# Patient Record
Sex: Male | Born: 1950 | Race: White | Hispanic: No | Marital: Married | Smoking: Former smoker
Health system: Southern US, Community
[De-identification: ages and names within clinical notes are randomized; demographics above are authoritative.]

## PROBLEM LIST (undated history)

## (undated) DIAGNOSIS — Z951 Presence of aortocoronary bypass graft: Secondary | ICD-10-CM

## (undated) DIAGNOSIS — G4730 Sleep apnea, unspecified: Secondary | ICD-10-CM

## (undated) DIAGNOSIS — E119 Type 2 diabetes mellitus without complications: Secondary | ICD-10-CM

## (undated) DIAGNOSIS — Z973 Presence of spectacles and contact lenses: Secondary | ICD-10-CM

## (undated) DIAGNOSIS — Z974 Presence of external hearing-aid: Secondary | ICD-10-CM

## (undated) DIAGNOSIS — R011 Cardiac murmur, unspecified: Secondary | ICD-10-CM

## (undated) DIAGNOSIS — Z953 Presence of xenogenic heart valve: Secondary | ICD-10-CM

## (undated) DIAGNOSIS — F329 Major depressive disorder, single episode, unspecified: Secondary | ICD-10-CM

## (undated) DIAGNOSIS — K219 Gastro-esophageal reflux disease without esophagitis: Secondary | ICD-10-CM

## (undated) DIAGNOSIS — G2581 Restless legs syndrome: Secondary | ICD-10-CM

## (undated) DIAGNOSIS — E1159 Type 2 diabetes mellitus with other circulatory complications: Secondary | ICD-10-CM

## (undated) DIAGNOSIS — F32A Depression, unspecified: Secondary | ICD-10-CM

## (undated) DIAGNOSIS — E039 Hypothyroidism, unspecified: Secondary | ICD-10-CM

## (undated) DIAGNOSIS — R350 Frequency of micturition: Secondary | ICD-10-CM

## (undated) DIAGNOSIS — E7800 Pure hypercholesterolemia, unspecified: Secondary | ICD-10-CM

## (undated) DIAGNOSIS — N400 Enlarged prostate without lower urinary tract symptoms: Secondary | ICD-10-CM

## (undated) DIAGNOSIS — Z8701 Personal history of pneumonia (recurrent): Secondary | ICD-10-CM

## (undated) DIAGNOSIS — M1990 Unspecified osteoarthritis, unspecified site: Secondary | ICD-10-CM

## (undated) DIAGNOSIS — I2510 Atherosclerotic heart disease of native coronary artery without angina pectoris: Secondary | ICD-10-CM

## (undated) HISTORY — PX: COLONOSCOPY: SHX174

## (undated) HISTORY — PX: ESOPHAGOGASTRODUODENOSCOPY: SHX1529

## (undated) HISTORY — DX: Presence of xenogenic heart valve: Z95.3

## (undated) HISTORY — DX: Cardiac murmur, unspecified: R01.1

## (undated) HISTORY — DX: Presence of aortocoronary bypass graft: Z95.1

## (undated) HISTORY — DX: Restless legs syndrome: G25.81

## (undated) HISTORY — DX: Type 2 diabetes mellitus with other circulatory complications: E11.59

## (undated) HISTORY — DX: Pure hypercholesterolemia, unspecified: E78.00

## (undated) HISTORY — PX: TONSILLECTOMY: SHX5217

## (undated) HISTORY — DX: Depression, unspecified: F32.A

## (undated) HISTORY — DX: Major depressive disorder, single episode, unspecified: F32.9

## (undated) HISTORY — DX: Atherosclerotic heart disease of native coronary artery without angina pectoris: I25.10

---

## 1983-03-18 HISTORY — PX: VASECTOMY: SHX75

## 2000-10-19 ENCOUNTER — Encounter: Admission: RE | Admit: 2000-10-19 | Discharge: 2001-01-17 | Payer: Self-pay | Admitting: Family Medicine

## 2005-09-19 ENCOUNTER — Encounter: Admission: RE | Admit: 2005-09-19 | Discharge: 2005-09-19 | Payer: Self-pay | Admitting: Family Medicine

## 2006-09-16 ENCOUNTER — Encounter: Admission: RE | Admit: 2006-09-16 | Discharge: 2006-09-16 | Payer: Self-pay | Admitting: Family Medicine

## 2008-08-23 ENCOUNTER — Ambulatory Visit (HOSPITAL_COMMUNITY): Admission: RE | Admit: 2008-08-23 | Discharge: 2008-08-23 | Payer: Self-pay | Admitting: Cardiology

## 2008-08-23 ENCOUNTER — Encounter (INDEPENDENT_AMBULATORY_CARE_PROVIDER_SITE_OTHER): Payer: Self-pay | Admitting: Cardiology

## 2010-05-26 ENCOUNTER — Emergency Department (HOSPITAL_COMMUNITY): Payer: BC Managed Care – PPO

## 2010-05-26 ENCOUNTER — Emergency Department (HOSPITAL_COMMUNITY)
Admission: EM | Admit: 2010-05-26 | Discharge: 2010-05-26 | Disposition: A | Discharge status: Home / Self Care | Payer: BC Managed Care – PPO | Attending: Emergency Medicine | Admitting: Emergency Medicine

## 2010-05-26 DIAGNOSIS — S20219A Contusion of unspecified front wall of thorax, initial encounter: Secondary | ICD-10-CM | POA: Insufficient documentation

## 2010-05-26 DIAGNOSIS — E119 Type 2 diabetes mellitus without complications: Secondary | ICD-10-CM | POA: Insufficient documentation

## 2010-05-26 DIAGNOSIS — Y92009 Unspecified place in unspecified non-institutional (private) residence as the place of occurrence of the external cause: Secondary | ICD-10-CM | POA: Insufficient documentation

## 2010-05-26 DIAGNOSIS — W1809XA Striking against other object with subsequent fall, initial encounter: Secondary | ICD-10-CM | POA: Insufficient documentation

## 2010-05-26 DIAGNOSIS — R071 Chest pain on breathing: Secondary | ICD-10-CM | POA: Insufficient documentation

## 2010-05-26 DIAGNOSIS — E7800 Pure hypercholesterolemia, unspecified: Secondary | ICD-10-CM | POA: Insufficient documentation

## 2010-05-26 DIAGNOSIS — M129 Arthropathy, unspecified: Secondary | ICD-10-CM | POA: Insufficient documentation

## 2010-05-26 DIAGNOSIS — M25519 Pain in unspecified shoulder: Secondary | ICD-10-CM | POA: Insufficient documentation

## 2012-07-30 ENCOUNTER — Ambulatory Visit (INDEPENDENT_AMBULATORY_CARE_PROVIDER_SITE_OTHER): Payer: 59 | Admitting: Surgery

## 2012-07-30 ENCOUNTER — Encounter (INDEPENDENT_AMBULATORY_CARE_PROVIDER_SITE_OTHER): Payer: Self-pay | Admitting: Surgery

## 2012-07-30 VITALS — BP 138/80 | HR 70 | Temp 97.8°F | Resp 18 | Ht 70.0 in | Wt 173.0 lb

## 2012-07-30 DIAGNOSIS — K4090 Unilateral inguinal hernia, without obstruction or gangrene, not specified as recurrent: Secondary | ICD-10-CM | POA: Insufficient documentation

## 2012-07-30 NOTE — Progress Notes (Signed)
Patient ID: Steven Lawrence, male   DOB: 04/30/1950, 62 y.o.   MRN: [id number]  Chief Complaint  Patient presents with  . New Evaluation    R/H    HPI Steven Lawrence is a 62 y.o. male.  Referred by Ma Hillock PA for Dr. Lupita Raider for right inguinal hernia HPI This is a 62 year old male who presents with a one-year history of intermittent discomfort in his right groin. Recently this has become more severe and more persistent. He denies any bulging in this area. He was examined and was felt to have a right inguinal hernia. He is now referred for surgical evaluation. He does report occasional abdominal bloating as well as pain radiating down into his right testicle and into his right side.  PMH: Diabetes type 2 Benign prostatic hypertrophy Hypercholesterolemia Restless leg syndrome Obstructive sleep apnea requiring CPAP use at night.  Past Surgical History  Procedure Laterality Date  . Tonsillectomy    . Vasectomy  1985    Family History  Problem Relation Age of Onset  . Diabetes Mother   . Heart disease Mother   . Parkinson's disease Mother   . Heart disease Father   . Cancer Father     melonma/Lung  . Diabetes Sister   . Diabetes Brother   . Heart disease Brother   . Multiple sclerosis Daughter     Social History History  Substance Use Topics  . Smoking status: Former Games developer  . Smokeless tobacco: Not on file     Comment: stopped 4/14  . Alcohol Use: Not on file    Allergies  Allergen Reactions  . Nsaids     Current Outpatient Prescriptions  Medication Sig Dispense Refill  . aspirin 81 MG tablet Take 81 mg by mouth daily.      Marland Kitchen atorvastatin (LIPITOR) 20 MG tablet       . doxazosin (CARDURA) 2 MG tablet       . glipiZIDE-metformin (METAGLIP) 5-500 MG per tablet       . meloxicam (MOBIC) 15 MG tablet       . Multiple Vitamins-Minerals (MULTIVITAMIN WITH MINERALS) tablet Take 1 tablet by mouth daily.      . ONGLYZA 5 MG TABS tablet       . rOPINIRole  (REQUIP) 1 MG tablet        No current facility-administered medications for this visit.    Review of Systems Review of Systems  Constitutional: Negative for fever, chills and unexpected weight change.  HENT: Negative for hearing loss, congestion, sore throat, trouble swallowing and voice change.   Eyes: Negative for visual disturbance.  Respiratory: Negative for cough and wheezing.   Cardiovascular: Negative for chest pain, palpitations and leg swelling.  Gastrointestinal: Negative for nausea, vomiting, abdominal pain, diarrhea, constipation, blood in stool, abdominal distention, anal bleeding and rectal pain.  Genitourinary: Positive for testicular pain. Negative for hematuria and difficulty urinating.  Musculoskeletal: Negative for arthralgias.  Skin: Negative for rash and wound.  Neurological: Negative for seizures, syncope, weakness and headaches.  Hematological: Negative for adenopathy. Does not bruise/bleed easily.  Psychiatric/Behavioral: Negative for confusion.    Blood pressure 138/80, pulse 70, temperature 97.8 F (36.6 C), resp. rate 18, height 5\' 10"  (1.778 m), weight 173 lb (78.472 kg).  Physical Exam Physical Exam WDWN in NAD HEENT:  EOMI, sclera anicteric Neck:  No masses, no thyromegaly Lungs:  CTA bilaterally; normal respiratory effort CV:  Regular rate and rhythm; no murmurs Abd:  +bowel sounds,  soft, non-tender, no masses GU;  Bilateral descended testes; no testicular masses; reducible right inguinal hernia; no sign of left inguinal hernia Ext:  Well-perfused; no edema Skin:  Warm, dry; no sign of jaundice  Data Reviewed None  Assessment    Right inguinal hernia - reducible    Plan    Right inguinal hernia repair with mesh.  The surgical procedure has been discussed with the patient.  Potential risks, benefits, alternative treatments, and expected outcomes have been explained.  All of the patient's questions at this time have been answered.  The  likelihood of reaching the patient's treatment goal is good.  The patient understand the proposed surgical procedure and wishes to proceed.        Tyse Auriemma K. 07/30/2012, 10:41 AM

## 2012-08-04 ENCOUNTER — Encounter (HOSPITAL_BASED_OUTPATIENT_CLINIC_OR_DEPARTMENT_OTHER): Payer: Self-pay | Admitting: *Deleted

## 2012-08-04 NOTE — Progress Notes (Signed)
Pt compliant cpap uses-will bring all meds andcpap dos To come in for bmet-ekg Sees dr turner for sleep apnea-not cardiac

## 2012-08-05 ENCOUNTER — Telehealth (INDEPENDENT_AMBULATORY_CARE_PROVIDER_SITE_OTHER): Payer: Self-pay | Admitting: General Surgery

## 2012-08-05 NOTE — Telephone Encounter (Signed)
LMOM for patient to call back and ask for [facility] . Need to move his apt off of 08-27-12 to the 08-25-12

## 2012-08-06 ENCOUNTER — Encounter (HOSPITAL_BASED_OUTPATIENT_CLINIC_OR_DEPARTMENT_OTHER)
Admission: RE | Admit: 2012-08-06 | Discharge: 2012-08-06 | Disposition: A | Discharge status: Home / Self Care | Payer: 59 | Source: Ambulatory Visit | Attending: Surgery | Admitting: Surgery

## 2012-08-10 ENCOUNTER — Telehealth (INDEPENDENT_AMBULATORY_CARE_PROVIDER_SITE_OTHER): Payer: Self-pay | Admitting: General Surgery

## 2012-08-10 NOTE — Telephone Encounter (Signed)
Called patient and I got his wife and she stated that his family doctor did a xray and it show clear and they did not start him on any med. I told her if they do to please call back and ask for [facility] and I can let Dr Corliss Skains know. Patient has surgery on 08-12-12.

## 2012-08-10 NOTE — Telephone Encounter (Signed)
Patient called in stating he was having cold symptoms and felt like this was in his chest. He is scheduled for hernia repair with Dr Corliss Skains on 08/12/2012. I advised him to call and get in today with his PCP for evaluation and let us know if they put him on any medications. He will call back as needed.

## 2012-08-12 ENCOUNTER — Encounter (HOSPITAL_BASED_OUTPATIENT_CLINIC_OR_DEPARTMENT_OTHER): Payer: Self-pay | Admitting: Anesthesiology

## 2012-08-12 ENCOUNTER — Ambulatory Visit (HOSPITAL_BASED_OUTPATIENT_CLINIC_OR_DEPARTMENT_OTHER): Payer: 59 | Admitting: Anesthesiology

## 2012-08-12 ENCOUNTER — Ambulatory Visit (HOSPITAL_BASED_OUTPATIENT_CLINIC_OR_DEPARTMENT_OTHER)
Admission: RE | Admit: 2012-08-12 | Discharge: 2012-08-12 | Disposition: A | Discharge status: Home / Self Care | Payer: 59 | Source: Ambulatory Visit | Attending: Surgery | Admitting: Surgery

## 2012-08-12 ENCOUNTER — Encounter (HOSPITAL_BASED_OUTPATIENT_CLINIC_OR_DEPARTMENT_OTHER)
Admission: RE | Disposition: A | Discharge status: Home / Self Care | Payer: Self-pay | Source: Ambulatory Visit | Attending: Surgery

## 2012-08-12 ENCOUNTER — Encounter (HOSPITAL_BASED_OUTPATIENT_CLINIC_OR_DEPARTMENT_OTHER): Payer: Self-pay | Admitting: *Deleted

## 2012-08-12 DIAGNOSIS — G2581 Restless legs syndrome: Secondary | ICD-10-CM | POA: Insufficient documentation

## 2012-08-12 DIAGNOSIS — Z7982 Long term (current) use of aspirin: Secondary | ICD-10-CM | POA: Insufficient documentation

## 2012-08-12 DIAGNOSIS — K4090 Unilateral inguinal hernia, without obstruction or gangrene, not specified as recurrent: Secondary | ICD-10-CM

## 2012-08-12 DIAGNOSIS — N400 Enlarged prostate without lower urinary tract symptoms: Secondary | ICD-10-CM | POA: Insufficient documentation

## 2012-08-12 DIAGNOSIS — Z801 Family history of malignant neoplasm of trachea, bronchus and lung: Secondary | ICD-10-CM | POA: Insufficient documentation

## 2012-08-12 DIAGNOSIS — G4733 Obstructive sleep apnea (adult) (pediatric): Secondary | ICD-10-CM | POA: Insufficient documentation

## 2012-08-12 DIAGNOSIS — E7800 Pure hypercholesterolemia, unspecified: Secondary | ICD-10-CM | POA: Insufficient documentation

## 2012-08-12 DIAGNOSIS — Z886 Allergy status to analgesic agent status: Secondary | ICD-10-CM | POA: Insufficient documentation

## 2012-08-12 DIAGNOSIS — Z87891 Personal history of nicotine dependence: Secondary | ICD-10-CM | POA: Insufficient documentation

## 2012-08-12 DIAGNOSIS — E119 Type 2 diabetes mellitus without complications: Secondary | ICD-10-CM | POA: Insufficient documentation

## 2012-08-12 DIAGNOSIS — Z79899 Other long term (current) drug therapy: Secondary | ICD-10-CM | POA: Insufficient documentation

## 2012-08-12 HISTORY — DX: Presence of spectacles and contact lenses: Z97.3

## 2012-08-12 HISTORY — PX: INGUINAL HERNIA REPAIR: SHX194

## 2012-08-12 HISTORY — DX: Unspecified osteoarthritis, unspecified site: M19.90

## 2012-08-12 HISTORY — DX: Sleep apnea, unspecified: G47.30

## 2012-08-12 HISTORY — DX: Benign prostatic hyperplasia without lower urinary tract symptoms: N40.0

## 2012-08-12 HISTORY — DX: Presence of external hearing-aid: Z97.4

## 2012-08-12 HISTORY — DX: Frequency of micturition: R35.0

## 2012-08-12 HISTORY — DX: Type 2 diabetes mellitus without complications: E11.9

## 2012-08-12 HISTORY — PX: INSERTION OF MESH: SHX5868

## 2012-08-12 LAB — GLUCOSE, CAPILLARY: Glucose-Capillary: 178 mg/dL — ABNORMAL HIGH (ref 70–99)

## 2012-08-12 LAB — POCT HEMOGLOBIN-HEMACUE: Hemoglobin: 16 g/dL (ref 13.0–17.0)

## 2012-08-12 SURGERY — REPAIR, HERNIA, INGUINAL, ADULT
Anesthesia: Regional | Laterality: Right | Wound class: Clean

## 2012-08-12 MED ORDER — MIDAZOLAM HCL 2 MG/2ML IJ SOLN
1.0000 mg | INTRAMUSCULAR | Status: DC | PRN
  Administered 2012-08-12: 2 mg via INTRAVENOUS

## 2012-08-12 MED ORDER — BUPIVACAINE-EPINEPHRINE 0.25% -1:200000 IJ SOLN
INTRAMUSCULAR | Status: DC | PRN
  Administered 2012-08-12: 10 mL

## 2012-08-12 MED ORDER — OXYCODONE-ACETAMINOPHEN 5-325 MG PO TABS: 1.0000 | ORAL_TABLET | ORAL | Status: DC | PRN

## 2012-08-12 MED ORDER — LACTATED RINGERS IV SOLN
INTRAVENOUS | Status: DC
  Administered 2012-08-12 (×2): via INTRAVENOUS

## 2012-08-12 MED ORDER — CEFAZOLIN SODIUM-DEXTROSE 2-3 GM-% IV SOLR
2.0000 g | INTRAVENOUS | Status: AC
  Administered 2012-08-12: 2 g via INTRAVENOUS

## 2012-08-12 MED ORDER — MORPHINE SULFATE 2 MG/ML IJ SOLN: 2.0000 mg | INTRAMUSCULAR | Status: DC | PRN

## 2012-08-12 MED ORDER — PROPOFOL 10 MG/ML IV BOLUS
INTRAVENOUS | Status: DC | PRN
  Administered 2012-08-12: 150 mg via INTRAVENOUS

## 2012-08-12 MED ORDER — FENTANYL CITRATE 0.05 MG/ML IJ SOLN
50.0000 ug | INTRAMUSCULAR | Status: DC | PRN
  Administered 2012-08-12: 100 ug via INTRAVENOUS

## 2012-08-12 MED ORDER — CHLORHEXIDINE GLUCONATE 4 % EX LIQD: 1.0000 "application " | Freq: Once | CUTANEOUS | Status: DC

## 2012-08-12 MED ORDER — ONDANSETRON HCL 4 MG/2ML IJ SOLN: 4.0000 mg | INTRAMUSCULAR | Status: DC | PRN

## 2012-08-12 MED ORDER — HYDROMORPHONE HCL PF 1 MG/ML IJ SOLN
0.2500 mg | INTRAMUSCULAR | Status: DC | PRN
  Administered 2012-08-12: 0.5 mg via INTRAVENOUS

## 2012-08-12 MED ORDER — ONDANSETRON HCL 4 MG/2ML IJ SOLN
INTRAMUSCULAR | Status: DC | PRN
  Administered 2012-08-12: 4 mg via INTRAVENOUS

## 2012-08-12 MED ORDER — BUPIVACAINE-EPINEPHRINE PF 0.5-1:200000 % IJ SOLN
INTRAMUSCULAR | Status: DC | PRN
  Administered 2012-08-12: 30 mL

## 2012-08-12 MED ORDER — LIDOCAINE HCL (CARDIAC) 20 MG/ML IV SOLN
INTRAVENOUS | Status: DC | PRN
  Administered 2012-08-12: 60 mg via INTRAVENOUS

## 2012-08-12 MED ORDER — DEXAMETHASONE SODIUM PHOSPHATE 4 MG/ML IJ SOLN
INTRAMUSCULAR | Status: DC | PRN
  Administered 2012-08-12: 10 mg via INTRAVENOUS

## 2012-08-12 MED ORDER — OXYCODONE HCL 5 MG/5ML PO SOLN: 5.0000 mg | Freq: Once | ORAL | Status: DC | PRN

## 2012-08-12 MED ORDER — OXYCODONE HCL 5 MG PO TABS: 5.0000 mg | ORAL_TABLET | Freq: Once | ORAL | Status: DC | PRN

## 2012-08-12 SURGICAL SUPPLY — 53 items
APL SKNCLS STERI-STRIP NONHPOA (GAUZE/BANDAGES/DRESSINGS) ×1
BENZOIN TINCTURE PRP APPL 2/3 (GAUZE/BANDAGES/DRESSINGS) ×2 IMPLANT
BLADE HEX COATED 2.75 (ELECTRODE) ×2 IMPLANT
BLADE SURG 15 STRL LF DISP TIS (BLADE) ×1 IMPLANT
BLADE SURG 15 STRL SS (BLADE) ×2
BLADE SURG ROTATE 9660 (MISCELLANEOUS) IMPLANT
CANISTER SUCTION 1200CC (MISCELLANEOUS) IMPLANT
CHLORAPREP W/TINT 26ML (MISCELLANEOUS) ×2 IMPLANT
CLOTH BEACON ORANGE TIMEOUT ST (SAFETY) ×2 IMPLANT
COVER MAYO STAND STRL (DRAPES) ×2 IMPLANT
COVER TABLE BACK 60X90 (DRAPES) ×2 IMPLANT
DECANTER SPIKE VIAL GLASS SM (MISCELLANEOUS) ×1 IMPLANT
DRAIN PENROSE 1/2X12 LTX STRL (WOUND CARE) ×2 IMPLANT
DRAPE LAPAROTOMY TRNSV 102X78 (DRAPE) ×2 IMPLANT
DRAPE UTILITY XL STRL (DRAPES) ×2 IMPLANT
DRSG TEGADERM 4X4.75 (GAUZE/BANDAGES/DRESSINGS) ×2 IMPLANT
ELECT REM PT RETURN 9FT ADLT (ELECTROSURGICAL) ×2
ELECTRODE REM PT RTRN 9FT ADLT (ELECTROSURGICAL) ×1 IMPLANT
GAUZE SPONGE 4X4 12PLY STRL LF (GAUZE/BANDAGES/DRESSINGS) ×2 IMPLANT
GLOVE BIO SURGEON STRL SZ7 (GLOVE) ×2 IMPLANT
GLOVE BIOGEL PI IND STRL 7.5 (GLOVE) ×1 IMPLANT
GLOVE BIOGEL PI INDICATOR 7.5 (GLOVE) ×2
GLOVE EXAM NITRILE LRG STRL (GLOVE) ×1 IMPLANT
GLOVE SURG SS PI 7.5 STRL IVOR (GLOVE) ×1 IMPLANT
GOWN PREVENTION PLUS XLARGE (GOWN DISPOSABLE) ×4 IMPLANT
MESH ULTRAPRO 3X6 7.6X15CM (Mesh General) ×1 IMPLANT
NDL HYPO 25X1 1.5 SAFETY (NEEDLE) ×1 IMPLANT
NEEDLE HYPO 25X1 1.5 SAFETY (NEEDLE) ×2 IMPLANT
NS IRRIG 1000ML POUR BTL (IV SOLUTION) IMPLANT
PACK BASIN DAY SURGERY FS (CUSTOM PROCEDURE TRAY) ×2 IMPLANT
PAIN PUMP ON-Q 100MLX2ML 2.5IN (PAIN MANAGEMENT) IMPLANT
PENCIL BUTTON HOLSTER BLD 10FT (ELECTRODE) ×2 IMPLANT
SLEEVE SCD COMPRESS KNEE MED (MISCELLANEOUS) ×2 IMPLANT
SPONGE INTESTINAL PEANUT (DISPOSABLE) ×2 IMPLANT
SPONGE LAP 18X18 X RAY DECT (DISPOSABLE) IMPLANT
STRIP CLOSURE SKIN 1/2X4 (GAUZE/BANDAGES/DRESSINGS) ×2 IMPLANT
SUT MON AB 4-0 PC3 18 (SUTURE) ×2 IMPLANT
SUT PDS AB 0 CT 36 (SUTURE) IMPLANT
SUT PROLENE 2 0 SH DA (SUTURE) ×2 IMPLANT
SUT SILK 2 0 SH (SUTURE) IMPLANT
SUT SILK 3 0 SH 30 (SUTURE) IMPLANT
SUT SILK 3 0 TIES 17X18 (SUTURE)
SUT SILK 3-0 18XBRD TIE BLK (SUTURE) IMPLANT
SUT VIC AB 0 SH 27 (SUTURE) ×1 IMPLANT
SUT VIC AB 2-0 SH 27 (SUTURE) ×2
SUT VIC AB 2-0 SH 27XBRD (SUTURE) ×2 IMPLANT
SUT VIC AB 3-0 SH 27 (SUTURE) ×2
SUT VIC AB 3-0 SH 27X BRD (SUTURE) ×1 IMPLANT
SYR CONTROL 10ML LL (SYRINGE) ×2 IMPLANT
TOWEL OR 17X24 6PK STRL BLUE (TOWEL DISPOSABLE) ×2 IMPLANT
TOWEL OR NON WOVEN STRL DISP B (DISPOSABLE) ×2 IMPLANT
TUBE CONNECTING 20X1/4 (TUBING) IMPLANT
YANKAUER SUCT BULB TIP NO VENT (SUCTIONS) IMPLANT

## 2012-08-12 NOTE — Anesthesia Postprocedure Evaluation (Signed)
  Anesthesia Post-op Note  Patient: Steven Lawrence  Procedure(s) Performed: Procedure(s): HERNIA REPAIR INGUINAL ADULT (Right) INSERTION OF MESH (Right)  Patient Location: PACU  Anesthesia Type:GA combined with regional for post-op pain  Level of Consciousness: awake, alert  and oriented  Airway and Oxygen Therapy: Patient Spontanous Breathing and Patient connected to face mask oxygen  Post-op Pain: mild  Post-op Assessment: Post-op Vital signs reviewed, Patient's Cardiovascular Status Stable, Respiratory Function Stable, Patent Airway and No signs of Nausea or vomiting  Post-op Vital Signs: Reviewed and stable  Complications: No apparent anesthesia complications

## 2012-08-12 NOTE — Anesthesia Procedure Notes (Addendum)
Anesthesia Regional Block:  TAP block  Pre-Anesthetic Checklist: ,, timeout performed, Correct Patient, Correct Site, Correct Laterality, Correct Procedure, Correct Position, site marked, Risks and benefits discussed, pre-op evaluation, post-op pain management  Laterality: Right  Prep: chloraprep       Needles:  Injection technique: Single-shot  Needle Type: Echogenic Stimulator Needle          Additional Needles:  Procedures: ultrasound guided (picture in chart) TAP block Narrative:  Start time: 08/12/2012 12:00 PM End time: 08/12/2012 12:10 PM Injection made incrementally with aspirations every 5 mL. Anesthesiologist: Fitzgerald,MD  Additional Notes: 2% Lidocaine for skin.   Procedure Name: LMA Insertion Date/Time: 08/12/2012 12:38 PM Performed by: Gar Gibbon Pre-anesthesia Checklist: Patient identified, Emergency Drugs available, Suction available and Patient being monitored Patient Re-evaluated:Patient Re-evaluated prior to inductionOxygen Delivery Method: Circle System Utilized Preoxygenation: Pre-oxygenation with 100% oxygen Intubation Type: IV induction Ventilation: Mask ventilation without difficulty LMA: LMA inserted LMA Size: 4.0 Number of attempts: 1 Airway Equipment and Method: bite block Placement Confirmation: positive ETCO2 Tube secured with: Tape Dental Injury: Teeth and Oropharynx as per pre-operative assessment

## 2012-08-12 NOTE — Op Note (Signed)
Hernia, Open, Procedure Note  Indications: The patient presented with a history of a right, reducible inguinal hernia.    Pre-operative Diagnosis: right reducible inguinal hernia Post-operative Diagnosis: same  Surgeon: Wynona Luna.   Assistants: none  Anesthesia: General LMA anesthesia and TAP block  ASA Class: 2  Procedure Details  The patient was seen again in the Holding Room. The risks, benefits, complications, treatment options, and expected outcomes were discussed with the patient. The possibilities of reaction to medication, pulmonary aspiration, perforation of viscus, bleeding, recurrent infection, the need for additional procedures, and development of a complication requiring transfusion or further operation were discussed with the patient and/or family. The likelihood of success in repairing the hernia and returning the patient to their previous functional status is good.  There was concurrence with the proposed plan, and informed consent was obtained. The site of surgery was properly noted/marked. The patient was taken to the Operating Room, identified as Steven Lawrence, and the procedure verified as right inguinal hernia repair. A Time Out was held and the above information confirmed.  The patient was placed in the supine position and underwent induction of anesthesia. The lower abdomen and groin was prepped with Chloraprep and draped in the standard fashion, and 0.5% Marcaine with epinephrine was used to anesthetize the skin over the mid-portion of the inguinal canal. An oblique incision was made. Dissection was carried down through the subcutaneous tissue with cautery to the external oblique fascia.  We opened the external oblique fascia along the direction of its fibers to the external ring.  The spermatic cord was circumferentially dissected bluntly and retracted with a Penrose drain.  The floor of the inguinal canal was inspected and contained a 1.5 cm direct defect.  We closed  the floor of the inguinal canal with 0 Vicryl.  We skeletonized the spermatic cord and reduced a medium-sized cord lipoma.  We used a 3 x 6 inch piece of Ultrapro mesh, which was cut into a keyhole shape.  This was secured with 2-0 Prolene, beginning at the pubic tubercle, running this along the internal oblique fascia superiorly and the shelving edge inferiorly.  The tails of the mesh were sutured together behind the spermatic cord.  The mesh was tucked underneath the external oblique fascia laterally.  The external oblique fascia was reapproximated with 2-0 Vicryl.  3-0 Vicryl was used to close the subcutaneous tissues and 4-0 Monocryl was used to close the skin in subcuticular fashion.  Benzoin and steri-strips were used to seal the incision.  A clean dressing was applied.  The patient was then extubated and brought to the recovery room in stable condition.  All sponge, instrument, and needle counts were correct prior to closure and at the conclusion of the case.   Estimated Blood Loss: Minimal                 Complications: None; patient tolerated the procedure well.         Disposition: PACU - hemodynamically stable.         Condition: stable  Wilmon Arms. Corliss Skains, MD, [facility]  General/ Trauma Surgery  08/12/2012 1:34 PM

## 2012-08-12 NOTE — Progress Notes (Signed)
Assisted Dr. Fitzgerald with right, ultrasound guided, transabdominal plane block. Side rails up, monitors on throughout procedure. See vital signs in flow sheet. Tolerated Procedure well. 

## 2012-08-12 NOTE — Interval H&P Note (Signed)
History and Physical Interval Note:  08/12/2012 12:19 PM  Steven Lawrence  has presented today for surgery, with the diagnosis of right inguinal hernia  The various methods of treatment have been discussed with the patient and family. After consideration of risks, benefits and other options for treatment, the patient has consented to  Procedure(s): HERNIA REPAIR INGUINAL ADULT (Right) INSERTION OF MESH (Right) as a surgical intervention .  The patient's history has been reviewed, patient examined, no change in status, stable for surgery.  I have reviewed the patient's chart and labs.  Questions were answered to the patient's satisfaction.     Steven Kley K.

## 2012-08-12 NOTE — H&P (View-Only) (Signed)
Patient ID: Steven Lawrence, male   DOB: 12/22/1950, 61 y.o.   MRN: [id number]  Chief Complaint  Patient presents with  . New Evaluation    R/H    HPI Steven Lawrence is a 61 y.o. male.  Referred by [name] PA for Dr. Kimberlee Shaw for right inguinal hernia HPI This is a 61-year-old male who presents with a one-year history of intermittent discomfort in his right groin. Recently this has become more severe and more persistent. He denies any bulging in this area. He was examined and was felt to have a right inguinal hernia. He is now referred for surgical evaluation. He does report occasional abdominal bloating as well as pain radiating down into his right testicle and into his right side.  PMH: Diabetes type 2 Benign prostatic hypertrophy Hypercholesterolemia Restless leg syndrome Obstructive sleep apnea requiring CPAP use at night.  Past Surgical History  Procedure Laterality Date  . Tonsillectomy    . Vasectomy  1985    Family History  Problem Relation Age of Onset  . Diabetes Mother   . Heart disease Mother   . Parkinson's disease Mother   . Heart disease Father   . Cancer Father     melonma/Lung  . Diabetes Sister   . Diabetes Brother   . Heart disease Brother   . Multiple sclerosis Daughter     Social History History  Substance Use Topics  . Smoking status: Former Smoker  . Smokeless tobacco: Not on file     Comment: stopped 4/14  . Alcohol Use: Not on file    Allergies  Allergen Reactions  . Nsaids     Current Outpatient Prescriptions  Medication Sig Dispense Refill  . aspirin 81 MG tablet Take 81 mg by mouth daily.      . atorvastatin (LIPITOR) 20 MG tablet       . doxazosin (CARDURA) 2 MG tablet       . glipiZIDE-metformin (METAGLIP) 5-500 MG per tablet       . meloxicam (MOBIC) 15 MG tablet       . Multiple Vitamins-Minerals (MULTIVITAMIN WITH MINERALS) tablet Take 1 tablet by mouth daily.      . ONGLYZA 5 MG TABS tablet       . rOPINIRole  (REQUIP) 1 MG tablet        No current facility-administered medications for this visit.    Review of Systems Review of Systems  Constitutional: Negative for fever, chills and unexpected weight change.  HENT: Negative for hearing loss, congestion, sore throat, trouble swallowing and voice change.   Eyes: Negative for visual disturbance.  Respiratory: Negative for cough and wheezing.   Cardiovascular: Negative for chest pain, palpitations and leg swelling.  Gastrointestinal: Negative for nausea, vomiting, abdominal pain, diarrhea, constipation, blood in stool, abdominal distention, anal bleeding and rectal pain.  Genitourinary: Positive for testicular pain. Negative for hematuria and difficulty urinating.  Musculoskeletal: Negative for arthralgias.  Skin: Negative for rash and wound.  Neurological: Negative for seizures, syncope, weakness and headaches.  Hematological: Negative for adenopathy. Does not bruise/bleed easily.  Psychiatric/Behavioral: Negative for confusion.    Blood pressure 138/80, pulse 70, temperature 97.8 F (36.6 C), resp. rate 18, height 5' 10" (1.778 m), weight 173 lb (78.472 kg).  Physical Exam Physical Exam WDWN in NAD HEENT:  EOMI, sclera anicteric Neck:  No masses, no thyromegaly Lungs:  CTA bilaterally; normal respiratory effort CV:  Regular rate and rhythm; no murmurs Abd:  +bowel sounds,   soft, non-tender, no masses GU;  Bilateral descended testes; no testicular masses; reducible right inguinal hernia; no sign of left inguinal hernia Ext:  Well-perfused; no edema Skin:  Warm, dry; no sign of jaundice  Data Reviewed None  Assessment    Right inguinal hernia - reducible    Plan    Right inguinal hernia repair with mesh.  The surgical procedure has been discussed with the patient.  Potential risks, benefits, alternative treatments, and expected outcomes have been explained.  All of the patient's questions at this time have been answered.  The  likelihood of reaching the patient's treatment goal is good.  The patient understand the proposed surgical procedure and wishes to proceed.        Steven Lawrence K. 07/30/2012, 10:41 AM    

## 2012-08-12 NOTE — Transfer of Care (Signed)
Immediate Anesthesia Transfer of Care Note  Patient: Steven Lawrence  Procedure(s) Performed: Procedure(s): HERNIA REPAIR INGUINAL ADULT (Right) INSERTION OF MESH (Right)  Patient Location: PACU  Anesthesia Type:GA combined with regional for post-op pain  Level of Consciousness: sedated  Airway & Oxygen Therapy: Patient Spontanous Breathing and Patient connected to face mask oxygen  Post-op Assessment: Report given to PACU RN and Post -op Vital signs reviewed and stable  Post vital signs: Reviewed and stable  Complications: No apparent anesthesia complications

## 2012-08-12 NOTE — Anesthesia Preprocedure Evaluation (Signed)
Anesthesia Evaluation  Patient identified by MRN, date of birth, ID band Patient awake    Reviewed: Allergy & Precautions, H&P , NPO status , Patient's Chart, lab work & pertinent test results  Airway Mallampati: II TM Distance: >3 FB Neck ROM: Full    Dental no notable dental hx. (+) Teeth Intact and Dental Advisory Given   Pulmonary sleep apnea and Continuous Positive Airway Pressure Ventilation , Current Smoker,  breath sounds clear to auscultation  Pulmonary exam normal       Cardiovascular negative cardio ROS  Rhythm:Regular Rate:Normal     Neuro/Psych negative neurological ROS  negative psych ROS   GI/Hepatic negative GI ROS, Neg liver ROS,   Endo/Other  diabetes, Type 2, Oral Hypoglycemic Agents  Renal/GU negative Renal ROS  negative genitourinary   Musculoskeletal   Abdominal   Peds  Hematology negative hematology ROS (+)   Anesthesia Other Findings   Reproductive/Obstetrics negative OB ROS                           Anesthesia Physical Anesthesia Plan  ASA: III  Anesthesia Plan: General and Regional   Post-op Pain Management:    Induction: Intravenous  Airway Management Planned: LMA  Additional Equipment:   Intra-op Plan:   Post-operative Plan: Extubation in OR  Informed Consent: I have reviewed the patients History and Physical, chart, labs and discussed the procedure including the risks, benefits and alternatives for the proposed anesthesia with the patient or authorized representative who has indicated his/her understanding and acceptance.   Dental advisory given  Plan Discussed with: CRNA  Anesthesia Plan Comments:         Anesthesia Quick Evaluation

## 2012-08-12 NOTE — Discharge Instructions (Signed)
Post Anesthesia Home Care Instructions  Activity: Get plenty of rest for the remainder of the day. A responsible adult should stay with you for 24 hours following the procedure.  For the next 24 hours, DO NOT: -Drive a car -Advertising copywriter -Drink alcoholic beverages -Take any medication unless instructed by your physician -Make any legal decisions or sign important papers.  Meals: Start with liquid foods such as gelatin or soup. Progress to regular foods as tolerated. Avoid greasy, spicy, heavy foods. If nausea and/or vomiting occur, drink only clear liquids until the nausea and/or vomiting subsides. Call your physician if vomiting continues.  Special Instructions/Symptoms: Your throat may feel dry or sore from the anesthesia or the breathing tube placed in your throat during surgery. If this causes discomfort, gargle with warm salt water. The discomfort should disappear within 24 hours.    Regional Anesthesia Blocks  1. Numbness or the inability to move the "blocked" extremity may last from 3-48 hours after placement. The length of time depends on the medication injected and your individual response to the medication. If the numbness is not going away after 48 hours, call your surgeon.  2. The extremity that is blocked will need to be protected until the numbness is gone and the  Strength has returned. Because you cannot feel it, you will need to take extra care to avoid injury. Because it may be weak, you may have difficulty moving it or using it. You may not know what position it is in without looking at it while the block is in effect.  3. For blocks in the legs and feet, returning to weight bearing and walking needs to be done carefully. You will need to wait until the numbness is entirely gone and the strength has returned. You should be able to move your leg and foot normally before you try and bear weight or walk. You will need someone to be with you when you first try to ensure  you do not fall and possibly risk injury.  4. Bruising and tenderness at the needle site are common side effects and will resolve in a few days.  5. Persistent numbness or new problems with movement should be communicated to the surgeon or the [facility] [phone] [facility] [phone]).    [facility]  UMBILICAL OR INGUINAL HERNIA REPAIR: POST OP INSTRUCTIONS  Always review your discharge instruction sheet given to you by the facility where your surgery was performed. IF YOU HAVE DISABILITY OR FAMILY LEAVE FORMS, YOU MUST BRING THEM TO THE OFFICE FOR PROCESSING.   DO NOT GIVE THEM TO YOUR DOCTOR.  1. A  prescription for pain medication may be given to you upon discharge.  Take your pain medication as prescribed, if needed.  If narcotic pain medicine is not needed, then you may take acetaminophen (Tylenol) or ibuprofen (Advil) as needed. 2. Take your usually prescribed medications unless otherwise directed. 3. If you need a refill on your pain medication, please contact your pharmacy.  They will contact our office to request authorization. Prescriptions will not be filled after 5 pm or on week-ends. 4. You should follow a light diet the first 24 hours after arrival home, such as soup and crackers, etc.  Be sure to include lots of fluids daily.  Resume your normal diet the day after surgery. 5. Most patients will experience some swelling and bruising around the umbilicus or in the groin and scrotum.  Ice packs and reclining  will help.  Swelling and bruising can take several days to resolve.  6. It is common to experience some constipation if taking pain medication after surgery.  Increasing fluid intake and taking a stool softener (such as Colace) will usually help or prevent this problem from occurring.  A mild laxative (Milk of Magnesia or Miralax) should be taken according to package directions if there are no bowel movements after 48  hours. 7. Unless discharge instructions indicate otherwise, you may remove your bandages 24-48 hours after surgery, and you may shower at that time.  You will have steri-strips (small skin tapes) in place directly over the incision.  These strips should be left on the skin for 7-10 days. 8. ACTIVITIES:  You may resume regular (light) daily activities beginning the next day--such as daily self-care, walking, climbing stairs--gradually increasing activities as tolerated.  You may have sexual intercourse when it is comfortable.  Refrain from any heavy lifting or straining until approved by your doctor. a. You may drive when you are no longer taking prescription pain medication, you can comfortably wear a seatbelt, and you can safely maneuver your car and apply brakes. b. RETURN TO WORK:  2-3 weeks with light duty - no lifting over 15 lbs. 9. You should see your doctor in the office for a follow-up appointment approximately 2-3 weeks after your surgery.  Make sure that you call for this appointment within a day or two after you arrive home to insure a convenient appointment time. 10. OTHER INSTRUCTIONS:  __________________________________________________________________________________________________________________________________________________________________________________________  WHEN TO CALL YOUR DOCTOR: 1. Fever over 101.0 2. Inability to urinate 3. Nausea and/or vomiting 4. Extreme swelling or bruising 5. Continued bleeding from incision. 6. Increased pain, redness, or drainage from the incision  The clinic staff is available to answer your questions during regular business hours.  Please dont hesitate to call and ask to speak to one of the nurses for clinical concerns.  If you have a medical emergency, go to the nearest emergency room or call 911.  A surgeon from [facility] is always on call at the hospital   [address], [place], [place]  [address] ?   [address] [address], [place], [place]   [address] [phone]    FAX [phone] Web site: [email or web address]

## 2012-08-13 ENCOUNTER — Encounter (HOSPITAL_BASED_OUTPATIENT_CLINIC_OR_DEPARTMENT_OTHER): Payer: Self-pay | Admitting: Surgery

## 2012-08-25 ENCOUNTER — Ambulatory Visit (INDEPENDENT_AMBULATORY_CARE_PROVIDER_SITE_OTHER): Payer: 59 | Admitting: Surgery

## 2012-08-25 ENCOUNTER — Encounter (INDEPENDENT_AMBULATORY_CARE_PROVIDER_SITE_OTHER): Payer: Self-pay | Admitting: Surgery

## 2012-08-25 VITALS — BP 128/84 | HR 68 | Temp 97.8°F | Resp 16 | Ht 70.0 in | Wt 169.4 lb

## 2012-08-25 DIAGNOSIS — K4090 Unilateral inguinal hernia, without obstruction or gangrene, not specified as recurrent: Secondary | ICD-10-CM

## 2012-08-25 NOTE — Progress Notes (Signed)
Status post right inguinal hernia repair with mesh on 08/12/12 for a direct hernia. The patient did well. Minimal swelling. No sign of recurrent hernia. The incision is well-healed with no sign of infection. He still has a patch of numbness on his anterior thigh which may or may not be related to his surgery. He may resume full into the at the end of July. Followup as needed  Wilmon Arms. Corliss Skains, MD, [facility]  General/ Trauma Surgery  08/25/2012 9:14 AM

## 2012-08-27 ENCOUNTER — Encounter (INDEPENDENT_AMBULATORY_CARE_PROVIDER_SITE_OTHER): Payer: 59 | Admitting: Surgery

## 2012-12-06 ENCOUNTER — Encounter: Payer: Self-pay | Admitting: *Deleted

## 2012-12-06 ENCOUNTER — Encounter: Payer: Self-pay | Admitting: Cardiology

## 2012-12-16 ENCOUNTER — Ambulatory Visit (INDEPENDENT_AMBULATORY_CARE_PROVIDER_SITE_OTHER): Payer: 59 | Admitting: Cardiology

## 2012-12-16 ENCOUNTER — Encounter: Payer: Self-pay | Admitting: Cardiology

## 2012-12-16 VITALS — BP 141/76 | HR 75 | Ht 70.0 in | Wt 170.8 lb

## 2012-12-16 DIAGNOSIS — F32A Depression, unspecified: Secondary | ICD-10-CM | POA: Insufficient documentation

## 2012-12-16 DIAGNOSIS — E119 Type 2 diabetes mellitus without complications: Secondary | ICD-10-CM | POA: Insufficient documentation

## 2012-12-16 DIAGNOSIS — F329 Major depressive disorder, single episode, unspecified: Secondary | ICD-10-CM | POA: Insufficient documentation

## 2012-12-16 DIAGNOSIS — G2581 Restless legs syndrome: Secondary | ICD-10-CM

## 2012-12-16 DIAGNOSIS — G4733 Obstructive sleep apnea (adult) (pediatric): Secondary | ICD-10-CM | POA: Insufficient documentation

## 2012-12-16 DIAGNOSIS — N400 Enlarged prostate without lower urinary tract symptoms: Secondary | ICD-10-CM | POA: Insufficient documentation

## 2012-12-16 NOTE — Progress Notes (Signed)
[address] [place], [place]  [address] Phone: [phone] Fax:  [phone]  Date:  12/16/2012   ID:  MAIJOR HORNIG, DOB 24-Nov-1950, MRN [id number]  PCP:  Lupita Raider, MD  Sleep Medicine:  Armanda Magic, MD    History of Present Illness: ABIMAEL ZEITER is a 62 y.o. male who presents today for followup of his OSA/obesity and restless legs syndrome.  He has not used his CPAP in about 3 months.  He stopped using it when he had bronchitis and never went back to it. Prior to having bronchitis he was doing well with the device.  He tolerate the pressure well.  He does not like the full face mask.  He does breath through his mouth.  He would like to try a nasal pillow mask.  Since stopping the CPAP he feels less rested when he awakens and has sleepiness in the afternoon.  He tries to walk for exercise.  He has been having a lot of problems with his RLS.  He is on Ropirinole.     Wt Readings from Last 3 Encounters:  12/16/12 170 lb 12.8 oz (77.474 kg)  08/25/12 169 lb 6.4 oz (76.839 kg)  08/12/12 166 lb (75.297 kg)     Past Medical History  Diagnosis Date  . Diabetes mellitus without complication   . Arthritis   . Sleep apnea     does use a cpap,primarily with upper airway resistance syndrome AHI 2.35/hr, RDI 10.2/hr ([name])  . Urinary frequency   . BPH (benign prostatic hyperplasia)     mild  . Wears glasses   . Wears hearing aid     both ears  . Hypercholesteremia   . RLS (restless legs syndrome)   . Depression   . Heart murmur     with AVSC echo 2009    Current Outpatient Prescriptions  Medication Sig Dispense Refill  . aspirin 81 MG tablet Take 81 mg by mouth daily.      Marland Kitchen atorvastatin (LIPITOR) 20 MG tablet       . doxazosin (CARDURA) 2 MG tablet Take 2 mg by mouth every morning. Takes for urinary freq      . glipiZIDE-metformin (METAGLIP) 5-500 MG per tablet 2 (two) times daily before a meal.       . meloxicam (MOBIC) 15 MG tablet       . Multiple  Vitamins-Minerals (MULTIVITAMIN WITH MINERALS) tablet Take 1 tablet by mouth daily.      . ONE TOUCH ULTRA TEST test strip       . ONGLYZA 5 MG TABS tablet       . rOPINIRole (REQUIP) 1 MG tablet        No current facility-administered medications for this visit.    Allergies:    Allergies  Allergen Reactions  . Nsaids     Rash-    Social History:  The patient  reports that he quit smoking about 5 months ago. He does not have any smokeless tobacco history on file. He reports that  drinks alcohol. He reports that he does not use illicit drugs.   Family History:  The patient's family history includes Cancer in his father; Diabetes in his brother, mother, and sister; Heart disease in his brother, father, and mother; Multiple sclerosis in his daughter; Parkinson's disease in his mother.   ROS:  Please see the history of present illness.      All other systems reviewed and negative.   PHYSICAL  EXAM: VS:  BP 141/76  Pulse 75  Ht 5\' 10"  (1.778 m)  Wt 170 lb 12.8 oz (77.474 kg)  BMI 24.51 kg/m2 Well nourished, well developed, in no acute distress HEENT: normal Neck: no JVD Cardiac:  normal S1, S2; RRR; 2/6 SM at RUSB Lungs:  clear to auscultation bilaterally, no wheezing, rhonchi or rales Abd: soft, nontender, no hepatomegaly Ext: no edema Skin: warm and dry Neuro:  CNs 2-12 intact, no focal abnormalities noted      ASSESSMENT AND PLAN:  1. OSA well tolerated but stopped during his bronchitis and has never restarted.  He is now having daytime sleepiness and nonrestorative sleep.  - I have encouarged him to restart his CPAP  - I will order a nasal pillow mask with chins strap and get a download in 4 weeks 2.  Restless legs syndrome - with increased problems even during the day when sitting at his desk - I suspect it may be due to him not using his CPAP at night.  He is on 2.5mg  of ropirinole nightly.  He wants to see if getting back on CPAP improves his symptoms before doing  anything else.     Followup with me in 6 months  Signed, Armanda Magic, MD 12/16/2012 3:58 PM

## 2012-12-16 NOTE — Patient Instructions (Signed)
Your physician recommends that you schedule a follow-up appointment in:  6 months with Dr. Mayford Knife  We are ordering a Resmed Airfit P10 Mask w chin strap and will get a download from [facility] in 4 weeks

## 2012-12-28 ENCOUNTER — Telehealth: Payer: Self-pay | Admitting: Cardiology

## 2012-12-28 NOTE — Telephone Encounter (Signed)
New problem     Pt  Has questions about dosage on  Ropinerole  2.5 mg  Once a day.   Pt would like to take 1 mg twice a day?

## 2012-12-28 NOTE — Telephone Encounter (Signed)
To Dr [name] to advise 

## 2012-12-28 NOTE — Telephone Encounter (Signed)
That would be fine 

## 2013-05-24 ENCOUNTER — Other Ambulatory Visit: Payer: Self-pay

## 2013-05-25 ENCOUNTER — Other Ambulatory Visit: Payer: Self-pay | Admitting: General Surgery

## 2013-05-25 ENCOUNTER — Other Ambulatory Visit: Payer: Self-pay

## 2013-05-25 MED ORDER — ROPINIROLE HCL 5 MG PO TABS: 2.5000 mg | ORAL_TABLET | Freq: Every day | ORAL | Status: DC

## 2013-05-25 NOTE — Telephone Encounter (Signed)
RX sent in for pt  

## 2013-07-20 ENCOUNTER — Other Ambulatory Visit: Payer: Self-pay | Admitting: Physician Assistant

## 2013-07-20 ENCOUNTER — Ambulatory Visit
Admission: RE | Admit: 2013-07-20 | Discharge: 2013-07-20 | Disposition: A | Discharge status: Home / Self Care | Payer: 59 | Source: Ambulatory Visit | Attending: Physician Assistant | Admitting: Physician Assistant

## 2013-07-20 DIAGNOSIS — M25551 Pain in right hip: Secondary | ICD-10-CM

## 2013-07-22 ENCOUNTER — Other Ambulatory Visit: Payer: Self-pay | Admitting: Family Medicine

## 2013-07-22 DIAGNOSIS — M545 Low back pain, unspecified: Secondary | ICD-10-CM

## 2013-07-28 ENCOUNTER — Ambulatory Visit
Admission: RE | Admit: 2013-07-28 | Discharge: 2013-07-28 | Disposition: A | Discharge status: Home / Self Care | Payer: 59 | Source: Ambulatory Visit | Attending: Family Medicine | Admitting: Family Medicine

## 2013-07-28 DIAGNOSIS — M545 Low back pain, unspecified: Secondary | ICD-10-CM

## 2013-09-02 ENCOUNTER — Other Ambulatory Visit: Payer: Self-pay

## 2013-09-02 MED ORDER — ROPINIROLE HCL 5 MG PO TABS: ORAL_TABLET | ORAL | Status: DC

## 2014-09-26 ENCOUNTER — Ambulatory Visit
Admission: RE | Admit: 2014-09-26 | Discharge: 2014-09-26 | Disposition: A | Discharge status: Home / Self Care | Payer: 59 | Source: Ambulatory Visit | Attending: Physician Assistant | Admitting: Physician Assistant

## 2014-09-26 ENCOUNTER — Other Ambulatory Visit: Payer: Self-pay | Admitting: Physician Assistant

## 2014-09-26 DIAGNOSIS — R634 Abnormal weight loss: Secondary | ICD-10-CM

## 2014-09-28 ENCOUNTER — Other Ambulatory Visit: Payer: Self-pay | Admitting: Physician Assistant

## 2014-09-28 DIAGNOSIS — R634 Abnormal weight loss: Secondary | ICD-10-CM

## 2014-10-03 ENCOUNTER — Ambulatory Visit
Admission: RE | Admit: 2014-10-03 | Discharge: 2014-10-03 | Disposition: A | Discharge status: Home / Self Care | Payer: 59 | Source: Ambulatory Visit | Attending: Physician Assistant | Admitting: Physician Assistant

## 2014-10-03 DIAGNOSIS — R634 Abnormal weight loss: Secondary | ICD-10-CM

## 2014-10-03 MED ORDER — IOPAMIDOL (ISOVUE-300) INJECTION 61%
100.0000 mL | Freq: Once | INTRAVENOUS | Status: AC | PRN
  Administered 2014-10-03: 100 mL via INTRAVENOUS

## 2014-10-05 ENCOUNTER — Other Ambulatory Visit: Payer: Self-pay | Admitting: Physician Assistant

## 2014-10-05 DIAGNOSIS — E041 Nontoxic single thyroid nodule: Secondary | ICD-10-CM

## 2014-10-11 ENCOUNTER — Ambulatory Visit
Admission: RE | Admit: 2014-10-11 | Discharge: 2014-10-11 | Disposition: A | Discharge status: Home / Self Care | Payer: 59 | Source: Ambulatory Visit | Attending: Physician Assistant | Admitting: Physician Assistant

## 2014-10-11 DIAGNOSIS — E041 Nontoxic single thyroid nodule: Secondary | ICD-10-CM

## 2014-12-08 ENCOUNTER — Other Ambulatory Visit: Payer: Self-pay | Admitting: Orthopedic Surgery

## 2014-12-27 ENCOUNTER — Encounter (HOSPITAL_COMMUNITY): Payer: Self-pay

## 2014-12-27 ENCOUNTER — Encounter (HOSPITAL_COMMUNITY)
Admission: RE | Admit: 2014-12-27 | Discharge: 2014-12-27 | Disposition: A | Discharge status: Home / Self Care | Payer: 59 | Source: Ambulatory Visit | Attending: Orthopedic Surgery | Admitting: Orthopedic Surgery

## 2014-12-27 ENCOUNTER — Ambulatory Visit (HOSPITAL_COMMUNITY)
Admission: RE | Admit: 2014-12-27 | Discharge: 2014-12-27 | Disposition: A | Discharge status: Home / Self Care | Payer: 59 | Source: Ambulatory Visit | Attending: Orthopedic Surgery | Admitting: Orthopedic Surgery

## 2014-12-27 DIAGNOSIS — Z7984 Long term (current) use of oral hypoglycemic drugs: Secondary | ICD-10-CM | POA: Diagnosis not present

## 2014-12-27 DIAGNOSIS — E119 Type 2 diabetes mellitus without complications: Secondary | ICD-10-CM | POA: Insufficient documentation

## 2014-12-27 DIAGNOSIS — E785 Hyperlipidemia, unspecified: Secondary | ICD-10-CM | POA: Insufficient documentation

## 2014-12-27 DIAGNOSIS — Z01812 Encounter for preprocedural laboratory examination: Secondary | ICD-10-CM | POA: Insufficient documentation

## 2014-12-27 DIAGNOSIS — R9431 Abnormal electrocardiogram [ECG] [EKG]: Secondary | ICD-10-CM | POA: Diagnosis not present

## 2014-12-27 DIAGNOSIS — I10 Essential (primary) hypertension: Secondary | ICD-10-CM | POA: Insufficient documentation

## 2014-12-27 DIAGNOSIS — K219 Gastro-esophageal reflux disease without esophagitis: Secondary | ICD-10-CM | POA: Insufficient documentation

## 2014-12-27 DIAGNOSIS — Z01818 Encounter for other preprocedural examination: Secondary | ICD-10-CM

## 2014-12-27 DIAGNOSIS — Z0183 Encounter for blood typing: Secondary | ICD-10-CM | POA: Diagnosis not present

## 2014-12-27 DIAGNOSIS — M79604 Pain in right leg: Secondary | ICD-10-CM | POA: Diagnosis not present

## 2014-12-27 DIAGNOSIS — E039 Hypothyroidism, unspecified: Secondary | ICD-10-CM | POA: Diagnosis not present

## 2014-12-27 DIAGNOSIS — Z7982 Long term (current) use of aspirin: Secondary | ICD-10-CM | POA: Diagnosis not present

## 2014-12-27 DIAGNOSIS — F17200 Nicotine dependence, unspecified, uncomplicated: Secondary | ICD-10-CM | POA: Insufficient documentation

## 2014-12-27 DIAGNOSIS — Z79899 Other long term (current) drug therapy: Secondary | ICD-10-CM | POA: Insufficient documentation

## 2014-12-27 HISTORY — DX: Personal history of pneumonia (recurrent): Z87.01

## 2014-12-27 HISTORY — DX: Hypothyroidism, unspecified: E03.9

## 2014-12-27 HISTORY — DX: Gastro-esophageal reflux disease without esophagitis: K21.9

## 2014-12-27 LAB — URINALYSIS, ROUTINE W REFLEX MICROSCOPIC
BILIRUBIN URINE: NEGATIVE
HGB URINE DIPSTICK: NEGATIVE
KETONES UR: NEGATIVE mg/dL
Leukocytes, UA: NEGATIVE
NITRITE: NEGATIVE
PH: 5 (ref 5.0–8.0)
Protein, ur: NEGATIVE mg/dL
Specific Gravity, Urine: 1.019 (ref 1.005–1.030)
Urobilinogen, UA: 0.2 mg/dL (ref 0.0–1.0)

## 2014-12-27 LAB — CBC WITH DIFFERENTIAL/PLATELET
Basophils Absolute: 0 10*3/uL (ref 0.0–0.1)
Basophils Relative: 1 %
EOS PCT: 4 %
Eosinophils Absolute: 0.3 10*3/uL (ref 0.0–0.7)
HCT: 44.5 % (ref 39.0–52.0)
Hemoglobin: 15 g/dL (ref 13.0–17.0)
LYMPHS ABS: 2.1 10*3/uL (ref 0.7–4.0)
LYMPHS PCT: 29 %
MCH: 32.3 pg (ref 26.0–34.0)
MCHC: 33.7 g/dL (ref 30.0–36.0)
MCV: 95.7 fL (ref 78.0–100.0)
MONO ABS: 0.8 10*3/uL (ref 0.1–1.0)
Monocytes Relative: 11 %
Neutro Abs: 4 10*3/uL (ref 1.7–7.7)
Neutrophils Relative %: 55 %
PLATELETS: 215 10*3/uL (ref 150–400)
RBC: 4.65 MIL/uL (ref 4.22–5.81)
RDW: 13.5 % (ref 11.5–15.5)
WBC: 7.2 10*3/uL (ref 4.0–10.5)

## 2014-12-27 LAB — COMPREHENSIVE METABOLIC PANEL
ALT: 13 U/L — AB (ref 17–63)
AST: 18 U/L (ref 15–41)
Albumin: 3.9 g/dL (ref 3.5–5.0)
Alkaline Phosphatase: 53 U/L (ref 38–126)
Anion gap: 8 (ref 5–15)
BILIRUBIN TOTAL: 0.7 mg/dL (ref 0.3–1.2)
BUN: 17 mg/dL (ref 6–20)
CO2: 26 mmol/L (ref 22–32)
CREATININE: 0.87 mg/dL (ref 0.61–1.24)
Calcium: 9.2 mg/dL (ref 8.9–10.3)
Chloride: 99 mmol/L — ABNORMAL LOW (ref 101–111)
Glucose, Bld: 186 mg/dL — ABNORMAL HIGH (ref 65–99)
Potassium: 3.8 mmol/L (ref 3.5–5.1)
Sodium: 133 mmol/L — ABNORMAL LOW (ref 135–145)
TOTAL PROTEIN: 6.3 g/dL — AB (ref 6.5–8.1)

## 2014-12-27 LAB — SURGICAL PCR SCREEN
MRSA, PCR: NEGATIVE
Staphylococcus aureus: NEGATIVE

## 2014-12-27 LAB — APTT: aPTT: 32 seconds (ref 24–37)

## 2014-12-27 LAB — PROTIME-INR
INR: 0.98 (ref 0.00–1.49)
PROTHROMBIN TIME: 13.2 s (ref 11.6–15.2)

## 2014-12-27 LAB — TYPE AND SCREEN
ABO/RH(D): O POS
ANTIBODY SCREEN: NEGATIVE

## 2014-12-27 LAB — URINE MICROSCOPIC-ADD ON

## 2014-12-27 LAB — ABO/RH: ABO/RH(D): O POS

## 2014-12-27 LAB — GLUCOSE, CAPILLARY: Glucose-Capillary: 193 mg/dL — ABNORMAL HIGH (ref 65–99)

## 2014-12-27 NOTE — Progress Notes (Signed)
PCP - Lennie Odor, PA Cardiologist - Dr. Radford Pax but does not see  EKG- 12/27/14 CXR - 12/27/14  Echo- 2010 - Epic Stress test - 2004 - Epic Cardiac Cath - denies  Patient denies shortness of breath and chest pain at PAT appointment.   Patient does have sleep apnea but states that he does not wear CPAP at night.

## 2014-12-27 NOTE — Pre-Procedure Instructions (Signed)
Steven Lawrence  12/27/2014      [id number] - [place], [place] - [address]  [place] [address] Phone: [phone] Fax: [phone]    Your procedure is scheduled on Thursday, October 20th, 2016.  Report to [facility] Admitting at 5:30 A.M.  Call this number if you have problems the morning of surgery:  [phone]   Remember:  Do not eat food or drink liquids after midnight.   Take these medicines the morning of surgery with A SIP OF WATER: Doxazosin (Cardura), Levothyroxine (Synthroid).  Stop taking the following: Aspirin, NSAIDS, Ibuprofen, Motrin, Advil, Aleve, Naproxen, Aleve, fish oil, all herbal medications, and all vitamins.    What do I do about my diabetes medications?   Do not take oral diabetes medicines (pills) the morning of surgery.   Do not take other diabetes injectables the day of surgery including Byetta, Victoza, Bydureon, and Trulicity.     Do not wear jewelry.  Do not wear lotions, powders, or colognes.  You may NOT wear deodorant.  Men may shave face and neck.  Do not bring valuables to the hospital.  [facility] is not responsible for any belongings or valuables.  Contacts, dentures or bridgework may not be worn into surgery.  Leave your suitcase in the car.  After surgery it may be brought to your room.  For patients admitted to the hospital, discharge time will be determined by your treatment team.  Patients discharged the day of surgery will not be allowed to drive home.   Special instructions:  See attached.   Please read over the following fact sheets that you were given. Pain Booklet, Coughing and Deep Breathing, Blood Transfusion Information, MRSA Information and Surgical Site Infection Prevention    How to Manage Your Diabetes Before Surgery   Why is it important to control my blood sugar before and after surgery?   Improving blood sugar levels before and after surgery helps healing and  can limit problems.  A way of improving blood sugar control is eating a healthy diet by:  - Eating less sugar and carbohydrates  - Increasing activity/exercise  - Talk with your doctor about reaching your blood sugar goals  High blood sugars (greater than 180 mg/dL) can raise your risk of infections and slow down your recovery so you will need to focus on controlling your diabetes during the weeks before surgery.  Make sure that the doctor who takes care of your diabetes knows about your planned surgery including the date and location.  How do I manage my blood sugars before surgery?   Check your blood sugar at least 4 times a day, 2 days before surgery to make sure that they are not too high or low.   Check your blood sugar the morning of your surgery when you wake up and every 2               hours until you get to the Short-Stay unit.  If your blood sugar is less than 70 mg/dL, you will need to treat for low blood sugar by:  Treat a low blood sugar (less than 70 mg/dL) with 1/2 cup of clear juice (cranberry or apple), 4 glucose tablets, OR glucose gel.  Recheck blood sugar in 15 minutes after treatment (to make sure it is greater than 70 mg/dL).  If blood sugar is not greater than 70 mg/dL on re-check, call [phone] for further instructions.   Report your  blood sugar to the Short-Stay nurse when you get to Short-Stay.  References:  [facility], 2007 "How to Manage your Diabetes Before and After Surgery".

## 2014-12-27 NOTE — Progress Notes (Signed)
Patient states that he has severe restless leg syndrome and is concerned about not getting his medicine for RLS while in the hospital.  Advised patient to speak with MD about concerns.

## 2014-12-28 LAB — HEMOGLOBIN A1C
Hgb A1c MFr Bld: 7.8 % — ABNORMAL HIGH (ref 4.8–5.6)
Mean Plasma Glucose: 177 mg/dL

## 2014-12-28 NOTE — Progress Notes (Signed)
Anesthesia Chart Review:  Pt is 64 year old male scheduled for L3-4 anterior lateral lumbar fusion, L3-4 posterior lumbar fusion on 01/04/2015 with Dr. Lynann Bologna.   PMH includes: DM, hyperlipidemia, heart murmur, hypothyroidism, GERD. Current smoker. BMI 22. S/p R inguinal hernia repair 08/12/12.   Medications include: ASA, lipitor, canagliflozin, doxazosin, levothyroxine, lisinopril, metformin, saxagliptin, requip.   Preoperative labs reviewed.  Glucose 186, hgbA1c 7.8.   Chest x-ray 12/27/2014 reviewed. Mild stable hyperinflation consistent with COPD. There is no active cardiopulmonary disease.  EKG 12/27/2014: NSR. Septal infarct, age undetermined. since last tracing 08/06/12 no significant change per Dr. Tonita Phoenix interpretation.   Echo 08/23/2008: 1. Left ventricle: The cavity size was normal. Systolic function was normal. The estimated ejection fraction was in the range of 55% to 65%. Wall motion was normal; there were no regional wall motion abnormalities. 2. Aortic valve: Trileaflet; mildly thickened, mildly calcified leaflets.  Nuclear stress test 01/10/2003: 1. Abnormal myocardial scintigraphy with a fixed perfusion defect in the inferior wall, mainly from the mid LV to the base of the heart. There is no evidence of underlying inducible ischemia, but inferior defect is consistent with diaphragmatic attenuation.  2. Normal LV size and systolic function. EF 61%. No regional wall motion abnormalities.  3. Maximum effort stress test with excellent workload.   If no changes, I anticipate pt can proceed with surgery as scheduled.   Willeen Cass, FNP-BC [facility]/Anesthesiology Phone: [phone] 12/28/2014 2:32 PM

## 2015-01-03 NOTE — H&P (Signed)
PREOPERATIVE H&P  Chief Complaint: R leg pain  HPI: Steven Lawrence is a 64 y.o. 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 Arthritis   . Urinary frequency   . BPH (benign prostatic hyperplasia)     mild  . Wears glasses   . Wears hearing aid     both ears  . Hypercholesteremia   . RLS (restless legs syndrome)   . Depression   . Heart murmur     with AVSC echo 2009  . Sleep apnea     does not use a cpap,primarily with upper airway resistance syndrome AHI 2.35/hr, RDI 10.2/hr ([name])  . Diabetes mellitus without complication ([place])     type II  . Hypothyroidism   . History of pneumonia as a child   . Acid reflux    Past Surgical History  Procedure Laterality Date  . Tonsillectomy    . Vasectomy  1985  . Colonoscopy    . Inguinal hernia repair Right 08/12/2012    Procedure: HERNIA REPAIR INGUINAL ADULT;  Surgeon: Imogene Burn. Georgette Dover, MD;  Location: [facility];  Service: General;  Laterality: Right;  . Insertion of mesh Right 08/12/2012    Procedure: INSERTION OF MESH;  Surgeon: Imogene Burn. Georgette Dover, MD;  Location: [facility];  Service: General;  Laterality: Right;  . Esophagogastroduodenoscopy     Social History   Social History  . Marital Status: Married    Spouse Name: N/A  . Number of Children: N/A  . Years of Education: N/A   Social History Main Topics  . Smoking status: Current Every Day Smoker -- 1.00 packs/day  . Smokeless tobacco: Not on file  . Alcohol Use: Yes     Comment: occ  . Drug Use: No  . Sexual Activity: Not on file   Other Topics Concern  . Not on file   Social History Narrative   Family History  Problem Relation Age of Onset  . Diabetes Mother   .  Heart disease Mother   . Parkinson's disease Mother   . Heart disease Father   . Cancer Father     melonma/Lung  . Diabetes Sister   . Diabetes Brother   . Heart disease Brother   . Multiple sclerosis Daughter    No Known Allergies Prior to Admission medications   Medication Sig Start Date End Date Taking? Authorizing Provider  aspirin 81 MG tablet Take 81 mg by mouth daily.   Yes Historical Provider, MD  atorvastatin (LIPITOR) 20 MG tablet Take 20 mg by mouth daily.  07/23/12  Yes Historical Provider, MD  canagliflozin (INVOKANA) 100 MG TABS tablet Take 100 mg by mouth daily.   Yes Historical Provider, MD  diclofenac (VOLTAREN) 75 MG EC tablet Take 75 mg by mouth 2 (two) times daily as needed for mild pain.   Yes Historical Provider, MD  doxazosin (CARDURA) 2 MG tablet Take 2 mg by mouth every morning. Takes for urinary freq 07/27/12  Yes Historical Provider, MD  levothyroxine (SYNTHROID, LEVOTHROID) 50 MCG tablet Take 50 mcg by mouth daily before breakfast.   Yes Historical Provider, MD  lisinopril (PRINIVIL,ZESTRIL) 10 MG tablet Take 10 mg by mouth daily.  Yes Historical Provider, MD  metFORMIN (GLUCOPHAGE) 1000 MG tablet Take 1,000 mg by mouth 2 (two) times daily with a meal.   Yes Historical Provider, MD  ONGLYZA 5 MG TABS tablet Take 5 mg by mouth.  07/04/12  Yes Historical Provider, MD  rOPINIRole (REQUIP) 1 MG tablet Take 2 mg by mouth at bedtime.   Yes Historical Provider, MD     All other systems have been reviewed and were otherwise negative with the exception of those mentioned in the HPI and as above.  Physical Exam: There were no vitals filed for this visit.  General: Alert, no acute distress Cardiovascular: No pedal edema Respiratory: No cyanosis, no use of accessory musculature Skin: No lesions in the area of chief complaint Neurologic: Sensation intact distally Psychiatric: Patient is competent for consent with normal mood and affect Lymphatic: No axillary or  cervical lymphadenopathy   Assessment/Plan: Right leg pain Plan for Procedure(s): ANTERIOR LATERAL LUMBAR FUSION 1 LEVEL POSTERIOR LUMBAR FUSION 1 LEVEL   Sinclair Ship, MD 01/03/2015 4:47 PM

## 2015-01-04 ENCOUNTER — Encounter (HOSPITAL_COMMUNITY)
Admission: RE | Disposition: A | Discharge status: Home / Self Care | Payer: Self-pay | Source: Ambulatory Visit | Attending: Orthopedic Surgery

## 2015-01-04 ENCOUNTER — Inpatient Hospital Stay (HOSPITAL_COMMUNITY): Payer: 59 | Admitting: Emergency Medicine

## 2015-01-04 ENCOUNTER — Inpatient Hospital Stay (HOSPITAL_COMMUNITY): Payer: 59 | Admitting: Anesthesiology

## 2015-01-04 ENCOUNTER — Encounter (HOSPITAL_COMMUNITY): Payer: Self-pay | Admitting: *Deleted

## 2015-01-04 ENCOUNTER — Inpatient Hospital Stay (HOSPITAL_COMMUNITY): Payer: 59

## 2015-01-04 ENCOUNTER — Inpatient Hospital Stay (HOSPITAL_COMMUNITY)
Admission: RE | Admit: 2015-01-04 | Discharge: 2015-01-05 | DRG: 455 | Disposition: A | Discharge status: Home / Self Care | Payer: 59 | Source: Ambulatory Visit | Attending: Orthopedic Surgery | Admitting: Orthopedic Surgery

## 2015-01-04 DIAGNOSIS — G2581 Restless legs syndrome: Secondary | ICD-10-CM | POA: Diagnosis present

## 2015-01-04 DIAGNOSIS — E7800 Pure hypercholesterolemia, unspecified: Secondary | ICD-10-CM | POA: Diagnosis present

## 2015-01-04 DIAGNOSIS — E039 Hypothyroidism, unspecified: Secondary | ICD-10-CM | POA: Diagnosis present

## 2015-01-04 DIAGNOSIS — F17200 Nicotine dependence, unspecified, uncomplicated: Secondary | ICD-10-CM | POA: Diagnosis present

## 2015-01-04 DIAGNOSIS — M5410 Radiculopathy, site unspecified: Secondary | ICD-10-CM | POA: Diagnosis present

## 2015-01-04 DIAGNOSIS — E119 Type 2 diabetes mellitus without complications: Secondary | ICD-10-CM | POA: Diagnosis present

## 2015-01-04 DIAGNOSIS — Z419 Encounter for procedure for purposes other than remedying health state, unspecified: Secondary | ICD-10-CM

## 2015-01-04 DIAGNOSIS — Z7982 Long term (current) use of aspirin: Secondary | ICD-10-CM | POA: Diagnosis not present

## 2015-01-04 DIAGNOSIS — M5116 Intervertebral disc disorders with radiculopathy, lumbar region: Secondary | ICD-10-CM | POA: Diagnosis present

## 2015-01-04 DIAGNOSIS — Z7984 Long term (current) use of oral hypoglycemic drugs: Secondary | ICD-10-CM

## 2015-01-04 DIAGNOSIS — M79604 Pain in right leg: Secondary | ICD-10-CM | POA: Diagnosis present

## 2015-01-04 DIAGNOSIS — M4806 Spinal stenosis, lumbar region: Secondary | ICD-10-CM | POA: Diagnosis present

## 2015-01-04 DIAGNOSIS — Z79899 Other long term (current) drug therapy: Secondary | ICD-10-CM | POA: Diagnosis not present

## 2015-01-04 DIAGNOSIS — I10 Essential (primary) hypertension: Secondary | ICD-10-CM | POA: Diagnosis present

## 2015-01-04 HISTORY — PX: ANTERIOR LAT LUMBAR FUSION: SHX1168

## 2015-01-04 LAB — GLUCOSE, CAPILLARY
GLUCOSE-CAPILLARY: 172 mg/dL — AB (ref 65–99)
GLUCOSE-CAPILLARY: 127 mg/dL — AB (ref 65–99)
GLUCOSE-CAPILLARY: 168 mg/dL — AB (ref 65–99)
Glucose-Capillary: 209 mg/dL — ABNORMAL HIGH (ref 65–99)

## 2015-01-04 SURGERY — ANTERIOR LATERAL LUMBAR FUSION 1 LEVEL
Anesthesia: General | Site: Spine Lumbar

## 2015-01-04 MED ORDER — DOCUSATE SODIUM 100 MG PO CAPS
100.0000 mg | ORAL_CAPSULE | Freq: Two times a day (BID) | ORAL | Status: DC
  Administered 2015-01-04 – 2015-01-05 (×2): 100 mg via ORAL
  Filled 2015-01-04 (×2): qty 1

## 2015-01-04 MED ORDER — ARTIFICIAL TEARS OP OINT
TOPICAL_OINTMENT | OPHTHALMIC | Status: AC
  Filled 2015-01-04: qty 3.5

## 2015-01-04 MED ORDER — SUFENTANIL CITRATE 50 MCG/ML IV SOLN
50.0000 ug | INTRAVENOUS | Status: DC | PRN
Start: 2015-01-04 — End: 2015-01-04
  Administered 2015-01-04: .15 ug/kg/h via INTRAVENOUS

## 2015-01-04 MED ORDER — ONDANSETRON HCL 4 MG/2ML IJ SOLN
INTRAMUSCULAR | Status: AC
  Filled 2015-01-04: qty 2

## 2015-01-04 MED ORDER — CEFAZOLIN SODIUM-DEXTROSE 2-3 GM-% IV SOLR
INTRAVENOUS | Status: AC
  Administered 2015-01-04: 2 g via INTRAVENOUS
  Filled 2015-01-04: qty 50

## 2015-01-04 MED ORDER — SODIUM CHLORIDE 0.9 % IJ SOLN: 3.0000 mL | INTRAMUSCULAR | Status: DC | PRN

## 2015-01-04 MED ORDER — NEOSTIGMINE METHYLSULFATE 10 MG/10ML IV SOLN
INTRAVENOUS | Status: AC
  Filled 2015-01-04: qty 1

## 2015-01-04 MED ORDER — FENTANYL CITRATE (PF) 250 MCG/5ML IJ SOLN
INTRAMUSCULAR | Status: DC | PRN
  Administered 2015-01-04 (×2): 100 ug via INTRAVENOUS
  Administered 2015-01-04: 50 ug via INTRAVENOUS

## 2015-01-04 MED ORDER — LINAGLIPTIN 5 MG PO TABS
5.0000 mg | ORAL_TABLET | Freq: Every day | ORAL | Status: DC
  Administered 2015-01-04 – 2015-01-05 (×2): 5 mg via ORAL
  Filled 2015-01-04 (×2): qty 1

## 2015-01-04 MED ORDER — PHENYLEPHRINE HCL 10 MG/ML IJ SOLN
10.0000 mg | INTRAVENOUS | Status: DC | PRN
  Administered 2015-01-04: 15 ug/min via INTRAVENOUS

## 2015-01-04 MED ORDER — EPHEDRINE SULFATE 50 MG/ML IJ SOLN
INTRAMUSCULAR | Status: AC
  Filled 2015-01-04: qty 1

## 2015-01-04 MED ORDER — POVIDONE-IODINE 7.5 % EX SOLN: Freq: Once | CUTANEOUS | Status: DC

## 2015-01-04 MED ORDER — SODIUM CHLORIDE 0.9 % IJ SOLN
3.0000 mL | Freq: Two times a day (BID) | INTRAMUSCULAR | Status: DC
  Administered 2015-01-04: 3 mL via INTRAVENOUS

## 2015-01-04 MED ORDER — CEFAZOLIN SODIUM 1-5 GM-% IV SOLN
1.0000 g | Freq: Three times a day (TID) | INTRAVENOUS | Status: AC
  Administered 2015-01-04 (×2): 1 g via INTRAVENOUS
  Filled 2015-01-04 (×2): qty 50

## 2015-01-04 MED ORDER — CANAGLIFLOZIN 100 MG PO TABS
100.0000 mg | ORAL_TABLET | Freq: Every day | ORAL | Status: DC
  Administered 2015-01-05: 100 mg via ORAL
  Filled 2015-01-04 (×2): qty 1

## 2015-01-04 MED ORDER — ONDANSETRON HCL 4 MG/2ML IJ SOLN: 4.0000 mg | INTRAMUSCULAR | Status: DC | PRN

## 2015-01-04 MED ORDER — SUFENTANIL CITRATE 50 MCG/ML IV SOLN
INTRAVENOUS | Status: AC
  Filled 2015-01-04: qty 1

## 2015-01-04 MED ORDER — LEVOTHYROXINE SODIUM 100 MCG PO TABS
50.0000 ug | ORAL_TABLET | Freq: Every day | ORAL | Status: DC
  Administered 2015-01-05: 50 ug via ORAL
  Filled 2015-01-04: qty 1

## 2015-01-04 MED ORDER — ATORVASTATIN CALCIUM 20 MG PO TABS
20.0000 mg | ORAL_TABLET | Freq: Every day | ORAL | Status: DC
  Administered 2015-01-05: 20 mg via ORAL
  Filled 2015-01-04: qty 1

## 2015-01-04 MED ORDER — ACETAMINOPHEN 325 MG PO TABS: 325.0000 mg | ORAL_TABLET | ORAL | Status: DC | PRN

## 2015-01-04 MED ORDER — METFORMIN HCL 500 MG PO TABS
1000.0000 mg | ORAL_TABLET | Freq: Two times a day (BID) | ORAL | Status: DC
  Administered 2015-01-04 – 2015-01-05 (×2): 1000 mg via ORAL
  Filled 2015-01-04 (×2): qty 2

## 2015-01-04 MED ORDER — THROMBIN 20000 UNITS EX SOLR
OROMUCOSAL | Status: DC | PRN
  Administered 2015-01-04: 09:00:00 via TOPICAL

## 2015-01-04 MED ORDER — BISACODYL 5 MG PO TBEC: 5.0000 mg | DELAYED_RELEASE_TABLET | Freq: Every day | ORAL | Status: DC | PRN

## 2015-01-04 MED ORDER — OXYCODONE HCL 5 MG PO TABS
ORAL_TABLET | ORAL | Status: AC
  Filled 2015-01-04: qty 1

## 2015-01-04 MED ORDER — PHENOL 1.4 % MT LIQD: 1.0000 | OROMUCOSAL | Status: DC | PRN

## 2015-01-04 MED ORDER — FLEET ENEMA 7-19 GM/118ML RE ENEM: 1.0000 | ENEMA | Freq: Once | RECTAL | Status: DC | PRN

## 2015-01-04 MED ORDER — SUCCINYLCHOLINE CHLORIDE 20 MG/ML IJ SOLN
INTRAMUSCULAR | Status: DC | PRN
  Administered 2015-01-04: 60 mg via INTRAVENOUS

## 2015-01-04 MED ORDER — LISINOPRIL 20 MG PO TABS
10.0000 mg | ORAL_TABLET | Freq: Every day | ORAL | Status: DC
  Administered 2015-01-04 – 2015-01-05 (×2): 10 mg via ORAL
  Filled 2015-01-04 (×2): qty 1

## 2015-01-04 MED ORDER — SODIUM CHLORIDE 0.9 % IV SOLN: INTRAVENOUS | Status: DC

## 2015-01-04 MED ORDER — EPHEDRINE SULFATE 50 MG/ML IJ SOLN
INTRAMUSCULAR | Status: DC | PRN
  Administered 2015-01-04: 5 mg via INTRAVENOUS
  Administered 2015-01-04: 10 mg via INTRAVENOUS

## 2015-01-04 MED ORDER — OXYCODONE-ACETAMINOPHEN 5-325 MG PO TABS
1.0000 | ORAL_TABLET | ORAL | Status: DC | PRN
  Administered 2015-01-04 – 2015-01-05 (×5): 2 via ORAL
  Filled 2015-01-04 (×5): qty 2

## 2015-01-04 MED ORDER — CEFAZOLIN SODIUM-DEXTROSE 2-3 GM-% IV SOLR: 2.0000 g | INTRAVENOUS | Status: DC

## 2015-01-04 MED ORDER — ACETAMINOPHEN 325 MG PO TABS: 650.0000 mg | ORAL_TABLET | ORAL | Status: DC | PRN

## 2015-01-04 MED ORDER — GLYCOPYRROLATE 0.2 MG/ML IJ SOLN
INTRAMUSCULAR | Status: AC
  Filled 2015-01-04: qty 1

## 2015-01-04 MED ORDER — SUCCINYLCHOLINE CHLORIDE 20 MG/ML IJ SOLN
INTRAMUSCULAR | Status: AC
  Filled 2015-01-04: qty 1

## 2015-01-04 MED ORDER — 0.9 % SODIUM CHLORIDE (POUR BTL) OPTIME
TOPICAL | Status: DC | PRN
  Administered 2015-01-04: 1000 mL

## 2015-01-04 MED ORDER — SENNOSIDES-DOCUSATE SODIUM 8.6-50 MG PO TABS: 1.0000 | ORAL_TABLET | Freq: Every evening | ORAL | Status: DC | PRN

## 2015-01-04 MED ORDER — BUPIVACAINE-EPINEPHRINE (PF) 0.25% -1:200000 IJ SOLN
INTRAMUSCULAR | Status: AC
  Filled 2015-01-04: qty 30

## 2015-01-04 MED ORDER — DIAZEPAM 5 MG PO TABS
5.0000 mg | ORAL_TABLET | Freq: Four times a day (QID) | ORAL | Status: DC | PRN
  Administered 2015-01-04 – 2015-01-05 (×3): 5 mg via ORAL
  Filled 2015-01-04 (×3): qty 1

## 2015-01-04 MED ORDER — ONDANSETRON HCL 4 MG/2ML IJ SOLN
INTRAMUSCULAR | Status: DC | PRN
  Administered 2015-01-04: 4 mg via INTRAVENOUS

## 2015-01-04 MED ORDER — FENTANYL CITRATE (PF) 250 MCG/5ML IJ SOLN
INTRAMUSCULAR | Status: AC
  Filled 2015-01-04: qty 5

## 2015-01-04 MED ORDER — MENTHOL 3 MG MT LOZG: 1.0000 | LOZENGE | OROMUCOSAL | Status: DC | PRN

## 2015-01-04 MED ORDER — DOXAZOSIN MESYLATE 2 MG PO TABS
2.0000 mg | ORAL_TABLET | Freq: Every morning | ORAL | Status: DC
  Administered 2015-01-05: 2 mg via ORAL
  Filled 2015-01-04: qty 1

## 2015-01-04 MED ORDER — ACETAMINOPHEN 650 MG RE SUPP: 650.0000 mg | RECTAL | Status: DC | PRN

## 2015-01-04 MED ORDER — ROPINIROLE HCL 1 MG PO TABS
2.0000 mg | ORAL_TABLET | Freq: Every day | ORAL | Status: DC
Start: 2015-01-04 — End: 2015-01-05
  Administered 2015-01-04: 2 mg via ORAL
  Filled 2015-01-04: qty 2

## 2015-01-04 MED ORDER — NEOSTIGMINE METHYLSULFATE 10 MG/10ML IV SOLN
INTRAVENOUS | Status: AC
  Filled 2015-01-04: qty 3

## 2015-01-04 MED ORDER — PROPOFOL 500 MG/50ML IV EMUL
INTRAVENOUS | Status: DC | PRN
  Administered 2015-01-04: 20 ug/kg/min via INTRAVENOUS

## 2015-01-04 MED ORDER — HYDROMORPHONE HCL 1 MG/ML IJ SOLN
INTRAMUSCULAR | Status: AC
  Filled 2015-01-04: qty 1

## 2015-01-04 MED ORDER — OXYCODONE HCL 5 MG/5ML PO SOLN: 5.0000 mg | Freq: Once | ORAL | Status: AC | PRN

## 2015-01-04 MED ORDER — LACTATED RINGERS IV SOLN
INTRAVENOUS | Status: DC | PRN
  Administered 2015-01-04 (×3): via INTRAVENOUS

## 2015-01-04 MED ORDER — ACETAMINOPHEN 160 MG/5ML PO SOLN
325.0000 mg | ORAL | Status: DC | PRN
  Filled 2015-01-04: qty 20.3

## 2015-01-04 MED ORDER — ZOLPIDEM TARTRATE 5 MG PO TABS: 5.0000 mg | ORAL_TABLET | Freq: Every evening | ORAL | Status: DC | PRN

## 2015-01-04 MED ORDER — GLYCOPYRROLATE 0.2 MG/ML IJ SOLN
INTRAMUSCULAR | Status: DC | PRN
  Administered 2015-01-04: .2 mg via INTRAVENOUS

## 2015-01-04 MED ORDER — BUPIVACAINE-EPINEPHRINE 0.25% -1:200000 IJ SOLN
INTRAMUSCULAR | Status: DC | PRN
  Administered 2015-01-04: 10 mL

## 2015-01-04 MED ORDER — LIDOCAINE HCL (CARDIAC) 20 MG/ML IV SOLN
INTRAVENOUS | Status: AC
  Filled 2015-01-04: qty 10

## 2015-01-04 MED ORDER — THROMBIN 20000 UNITS EX SOLR
CUTANEOUS | Status: AC
  Filled 2015-01-04: qty 20000

## 2015-01-04 MED ORDER — HYDROMORPHONE HCL 1 MG/ML IJ SOLN
0.2500 mg | INTRAMUSCULAR | Status: DC | PRN
  Administered 2015-01-04 (×4): 0.5 mg via INTRAVENOUS

## 2015-01-04 MED ORDER — ALUM & MAG HYDROXIDE-SIMETH 200-200-20 MG/5ML PO SUSP: 30.0000 mL | Freq: Four times a day (QID) | ORAL | Status: DC | PRN

## 2015-01-04 MED ORDER — MORPHINE SULFATE (PF) 2 MG/ML IV SOLN: 1.0000 mg | INTRAVENOUS | Status: DC | PRN

## 2015-01-04 MED ORDER — PROPOFOL 10 MG/ML IV BOLUS
INTRAVENOUS | Status: DC | PRN
  Administered 2015-01-04: 100 mg via INTRAVENOUS
  Administered 2015-01-04: 25 mg via INTRAVENOUS
  Administered 2015-01-04: 20 mg via INTRAVENOUS

## 2015-01-04 MED ORDER — SODIUM CHLORIDE 0.9 % IJ SOLN
INTRAMUSCULAR | Status: AC
  Filled 2015-01-04: qty 10

## 2015-01-04 MED ORDER — PROPOFOL 10 MG/ML IV BOLUS
INTRAVENOUS | Status: AC
  Filled 2015-01-04: qty 20

## 2015-01-04 MED ORDER — MIDAZOLAM HCL 2 MG/2ML IJ SOLN
INTRAMUSCULAR | Status: AC
  Filled 2015-01-04: qty 4

## 2015-01-04 MED ORDER — OXYCODONE HCL 5 MG PO TABS
5.0000 mg | ORAL_TABLET | Freq: Once | ORAL | Status: AC | PRN
  Administered 2015-01-04: 5 mg via ORAL

## 2015-01-04 MED ORDER — HYDROCODONE-ACETAMINOPHEN 5-325 MG PO TABS: 1.0000 | ORAL_TABLET | ORAL | Status: DC | PRN

## 2015-01-04 MED ORDER — LIDOCAINE HCL (CARDIAC) 20 MG/ML IV SOLN
INTRAVENOUS | Status: DC | PRN
  Administered 2015-01-04: 60 mg via INTRAVENOUS

## 2015-01-04 SURGICAL SUPPLY — 98 items
APL SKNCLS STERI-STRIP NONHPOA (GAUZE/BANDAGES/DRESSINGS) ×2
BENZOIN TINCTURE PRP APPL 2/3 (GAUZE/BANDAGES/DRESSINGS) ×4 IMPLANT
BLADE SURG 10 STRL SS (BLADE) ×4 IMPLANT
BLADE SURG ROTATE 9660 (MISCELLANEOUS) IMPLANT
BUR PRESCISION 1.7 ELITE (BURR) IMPLANT
BUR ROUND PRECISION 4.0 (BURR) IMPLANT
BUR ROUND PRECISION 4.0MM (BURR)
BUR SABER RD CUTTING 3.0 (BURR) IMPLANT
BUR SABER RD CUTTING 3.0MM (BURR)
CARTRIDGE OIL MAESTRO DRILL (MISCELLANEOUS) ×2 IMPLANT
CLOSURE STERI-STRIP 1/2X4 (GAUZE/BANDAGES/DRESSINGS) ×1
CLOSURE WOUND 1/2 X4 (GAUZE/BANDAGES/DRESSINGS) ×2
CLSR STERI-STRIP ANTIMIC 1/2X4 (GAUZE/BANDAGES/DRESSINGS) ×1 IMPLANT
CONT SPEC STER OR (MISCELLANEOUS) ×4 IMPLANT
COVER BACK TABLE 80X110 HD (DRAPES) ×4 IMPLANT
COVER MAYO STAND STRL (DRAPES) ×8 IMPLANT
COVER SURGICAL LIGHT HANDLE (MISCELLANEOUS) ×4 IMPLANT
DIFFUSER DRILL AIR PNEUMATIC (MISCELLANEOUS) ×4 IMPLANT
DRAIN CHANNEL 15F RND FF W/TCR (WOUND CARE) IMPLANT
DRAPE C-ARM 42X72 X-RAY (DRAPES) ×4 IMPLANT
DRAPE C-ARMOR (DRAPES) IMPLANT
DRAPE POUCH INSTRU U-SHP 10X18 (DRAPES) ×4 IMPLANT
DRAPE SURG 17X23 STRL (DRAPES) ×16 IMPLANT
DURAPREP 26ML APPLICATOR (WOUND CARE) ×4 IMPLANT
ELECT BLADE 4.0 EZ CLEAN MEGAD (MISCELLANEOUS) ×4
ELECT BLADE 6.5 EXT (BLADE) ×4 IMPLANT
ELECT CAUTERY BLADE 6.4 (BLADE) ×4 IMPLANT
ELECT REM PT RETURN 9FT ADLT (ELECTROSURGICAL) ×4
ELECTRODE BLDE 4.0 EZ CLN MEGD (MISCELLANEOUS) ×2 IMPLANT
ELECTRODE REM PT RTRN 9FT ADLT (ELECTROSURGICAL) ×2 IMPLANT
EVACUATOR SILICONE 100CC (DRAIN) IMPLANT
GAUZE SPONGE 4X4 12PLY STRL (GAUZE/BANDAGES/DRESSINGS) ×4 IMPLANT
GAUZE SPONGE 4X4 16PLY XRAY LF (GAUZE/BANDAGES/DRESSINGS) ×16 IMPLANT
GLOVE BIO SURGEON STRL SZ7 (GLOVE) ×8 IMPLANT
GLOVE BIO SURGEON STRL SZ8 (GLOVE) ×4 IMPLANT
GLOVE BIOGEL PI IND STRL 7.0 (GLOVE) ×2 IMPLANT
GLOVE BIOGEL PI IND STRL 8 (GLOVE) ×2 IMPLANT
GLOVE BIOGEL PI INDICATOR 7.0 (GLOVE) ×2
GLOVE BIOGEL PI INDICATOR 8 (GLOVE) ×2
GOWN STRL REUS W/ TWL LRG LVL3 (GOWN DISPOSABLE) ×4 IMPLANT
GOWN STRL REUS W/ TWL XL LVL3 (GOWN DISPOSABLE) ×4 IMPLANT
GOWN STRL REUS W/TWL LRG LVL3 (GOWN DISPOSABLE) ×8
GOWN STRL REUS W/TWL XL LVL3 (GOWN DISPOSABLE) ×8
GUIDEWIRE BLUNT VIPER II 1.45 (WIRE) ×2 IMPLANT
GUIDEWIRE SHARP VIPER II (WIRE) ×4 IMPLANT
IMPLANT COROENT XL 10X22X50MM (Intraocular Lens) ×2 IMPLANT
IV CATH 14GX2 1/4 (CATHETERS) ×4 IMPLANT
KIT BASIN OR (CUSTOM PROCEDURE TRAY) ×4 IMPLANT
KIT POSITION SURG JACKSON T1 (MISCELLANEOUS) ×4 IMPLANT
KIT ROOM TURNOVER OR (KITS) ×4 IMPLANT
MARKER SKIN DUAL TIP RULER LAB (MISCELLANEOUS) ×4 IMPLANT
MIX DBX 10CC 35% BONE (Bone Implant) ×2 IMPLANT
NDL HYPO 25GX1X1/2 BEV (NEEDLE) ×2 IMPLANT
NDL JAMSHIDI VIPER (NEEDLE) IMPLANT
NDL SAFETY ECLIPSE 18X1.5 (NEEDLE) ×2 IMPLANT
NDL SPNL 18GX3.5 QUINCKE PK (NEEDLE) ×4 IMPLANT
NEEDLE 22X1 1/2 (OR ONLY) (NEEDLE) ×4 IMPLANT
NEEDLE HYPO 18GX1.5 SHARP (NEEDLE) ×4
NEEDLE HYPO 25GX1X1/2 BEV (NEEDLE) ×4 IMPLANT
NEEDLE JAMSHIDI VIPER (NEEDLE) ×8 IMPLANT
NEEDLE SPNL 18GX3.5 QUINCKE PK (NEEDLE) ×8 IMPLANT
NS IRRIG 1000ML POUR BTL (IV SOLUTION) ×8 IMPLANT
OIL CARTRIDGE MAESTRO DRILL (MISCELLANEOUS) ×4
PACK LAMINECTOMY ORTHO (CUSTOM PROCEDURE TRAY) ×4 IMPLANT
PACK UNIVERSAL I (CUSTOM PROCEDURE TRAY) ×4 IMPLANT
PAD ARMBOARD 7.5X6 YLW CONV (MISCELLANEOUS) ×8 IMPLANT
PATTIES SURGICAL .5 X1 (DISPOSABLE) ×4 IMPLANT
PATTIES SURGICAL .5X1.5 (GAUZE/BANDAGES/DRESSINGS) ×4 IMPLANT
PLATE 2H 10MM (Plate) ×2 IMPLANT
ROD PRE LORDOSED VIPER 5.5X50 (Rod) ×2 IMPLANT
SCREW DECADE 5.5X50 (Screw) ×4 IMPLANT
SCREW SET SINGLE INNER MIS (Screw) ×4 IMPLANT
SCREW XTAB POLY VIPER  6X45 (Screw) ×4 IMPLANT
SCREW XTAB POLY VIPER 6X45 (Screw) IMPLANT
SPONGE INTESTINAL PEANUT (DISPOSABLE) ×8 IMPLANT
SPONGE LAP 4X18 X RAY DECT (DISPOSABLE) ×4 IMPLANT
SPONGE SURGIFOAM ABS GEL 100 (HEMOSTASIS) ×4 IMPLANT
STAPLER VISISTAT 35W (STAPLE) ×4 IMPLANT
STRIP CLOSURE SKIN 1/2X4 (GAUZE/BANDAGES/DRESSINGS) ×6 IMPLANT
SURGIFLO W/THROMBIN 8M KIT (HEMOSTASIS) IMPLANT
SUT MNCRL AB 4-0 PS2 18 (SUTURE) ×8 IMPLANT
SUT VIC AB 0 CT1 18XCR BRD 8 (SUTURE) ×2 IMPLANT
SUT VIC AB 0 CT1 8-18 (SUTURE) ×4
SUT VIC AB 1 CT1 18XCR BRD 8 (SUTURE) ×4 IMPLANT
SUT VIC AB 1 CT1 8-18 (SUTURE) ×8
SUT VIC AB 2-0 CT2 18 VCP726D (SUTURE) ×4 IMPLANT
SYR 20CC LL (SYRINGE) ×4 IMPLANT
SYR BULB IRRIGATION 50ML (SYRINGE) ×4 IMPLANT
SYR CONTROL 10ML LL (SYRINGE) ×8 IMPLANT
SYR TB 1ML LUER SLIP (SYRINGE) ×4 IMPLANT
TAP CANN VIPER2 DL 5.0 (TAP) ×4 IMPLANT
TAPE CLOTH SURG 6X10 WHT LF (GAUZE/BANDAGES/DRESSINGS) ×2 IMPLANT
TOWEL OR 17X24 6PK STRL BLUE (TOWEL DISPOSABLE) ×4 IMPLANT
TOWEL OR 17X26 10 PK STRL BLUE (TOWEL DISPOSABLE) ×4 IMPLANT
TRAY FOLEY CATH 16FR SILVER (SET/KITS/TRAYS/PACK) ×4 IMPLANT
TRAY FOLEY CATH 16FRSI W/METER (SET/KITS/TRAYS/PACK) ×4 IMPLANT
WATER STERILE IRR 1000ML POUR (IV SOLUTION) ×4 IMPLANT
YANKAUER SUCT BULB TIP NO VENT (SUCTIONS) ×4 IMPLANT

## 2015-01-04 NOTE — Transfer of Care (Signed)
Immediate Anesthesia Transfer of Care Note  Patient: Steven Lawrence  Procedure(s) Performed: Procedure(s) with comments: ANTERIOR LATERAL LUMBAR FUSION 1 LEVEL (Left) - Left sided lateral interbody fusion lumbar 3-4 with instrumentation and allograft POSTERIOR LUMBAR FUSION 1 LEVEL (N/A) - Posterior spinal fusion, lumbar 3-4 with instrumentation and allograft  Patient Location: PACU  Anesthesia Type:General  Level of Consciousness: awake, alert  and oriented  Airway & Oxygen Therapy: Patient Spontanous Breathing and Patient connected to nasal cannula oxygen  Post-op Assessment: Report given to RN and Post -op Vital signs reviewed and stable  Post vital signs: Reviewed and stable  Last Vitals:  Filed Vitals:   01/04/15 0631  BP: 133/74  Pulse: 73  Temp: 36.3 C  Resp: 20    Complications: No apparent anesthesia complications

## 2015-01-04 NOTE — Anesthesia Postprocedure Evaluation (Signed)
  Anesthesia Post-op Note  Patient: Steven Lawrence  Procedure(s) Performed: Procedure(s) with comments: ANTERIOR LATERAL LUMBAR FUSION 1 LEVEL (Left) - Left sided lateral interbody fusion lumbar 3-4 with instrumentation and allograft POSTERIOR LUMBAR FUSION 1 LEVEL (N/A) - Posterior spinal fusion, lumbar 3-4 with instrumentation and allograft  Patient Location: PACU  Anesthesia Type:General  Level of Consciousness: awake  Airway and Oxygen Therapy: Patient Spontanous Breathing  Post-op Pain: mild  Post-op Assessment: Post-op Vital signs reviewed, Patient's Cardiovascular Status Stable, Respiratory Function Stable, Patent Airway, No signs of Nausea or vomiting and Pain level controlled   LLE Sensation: Full sensation   RLE Sensation: Full sensation      Post-op Vital Signs: Reviewed and stable  Last Vitals:  Filed Vitals:   01/04/15 1310  BP: 120/70  Pulse: 79  Temp: 36.6 C  Resp: 18    Complications: No apparent anesthesia complications

## 2015-01-04 NOTE — Anesthesia Procedure Notes (Signed)
Procedure Name: Intubation Date/Time: 01/04/2015 7:44 AM Performed by: Mariea Clonts Pre-anesthesia Checklist: Patient identified, Timeout performed, Emergency Drugs available, Suction available and Patient being monitored Patient Re-evaluated:Patient Re-evaluated prior to inductionOxygen Delivery Method: Circle system utilized Preoxygenation: Pre-oxygenation with 100% oxygen Intubation Type: IV induction Ventilation: Mask ventilation without difficulty Laryngoscope Size: Miller and 2 Grade View: Grade I Tube type: Oral Tube size: 7.5 mm Number of attempts: 1 Airway Equipment and Method: LTA kit utilized Placement Confirmation: ETT inserted through vocal cords under direct vision,  positive ETCO2 and breath sounds checked- equal and bilateral Tube secured with: Tape Dental Injury: Teeth and Oropharynx as per pre-operative assessment

## 2015-01-04 NOTE — Plan of Care (Signed)
Problem: Consults Goal: Diagnosis - Spinal Surgery Outcome: Completed/Met Date Met:  01/04/15 [place]aco/Lumbar Spine Fusion

## 2015-01-04 NOTE — Op Note (Signed)
Steven Lawrence, Steven Lawrence NO.:  [id number]  MEDICAL RECORD NO.:  [id number]  LOCATION:  [facility]                         FACILITY:  [facility]  PHYSICIAN:  Phylliss Bob, MD      DATE OF BIRTH:  10-Nov-1950  DATE OF PROCEDURE:  01/04/2015                              OPERATIVE REPORT   PREOPERATIVE DIAGNOSES: 1. L3-4 degenerative disk disease. 2. Severe right-sided L3-4 neural foraminal stenosis. 3. Right-sided segmental collapse across the L3-4 intervertebral     space. 4. Right L3 radiculopathy.  POSTOPERATIVE DIAGNOSES: 1. L3-4 degenerative disk disease. 2. Severe right-sided L3-4 neural foraminal stenosis. 3. Right-sided segmental collapse across the L3-4 intervertebral     space. 4. Right L3 radiculopathy.  PROCEDURE: 1. Anterior lumbar interbody fusion via a left-sided lateral approach. 2. Insertion of interbody device x1 (NuVasive 10 x 50 x 22 mm     intervertebral spacer). 3. Placement of anterior instrumentation, L3-4. 4. Placement of posterior instrumentation, L3-4 (6 x 45 mm screws). 5. Posterior spinal fusion, L3-4. 6. Use of morselized allograft. 7. Intraoperative use of fluoroscopy.  SURGEON:  Phylliss Bob, MD.  ASSISTANTPricilla Holm, PA-C.  ANESTHESIA:  General endotracheal anesthesia.  COMPLICATIONS:  None.  DISPOSITION:  Stable.  ESTIMATED BLOOD LOSS:  Minimal.  INDICATIONS FOR SURGERY:  Briefly, Mr. Alligood is a very pleasant 64 year old male, who did present to me with severe and ongoing pain in the right leg consistent with right L3 radiculopathy.  The patient's MRI was clearly notable for severe segmental collapse on the right across the L3- 4 intervertebral space.  This was causing severe compression of the right L3 nerve, and to lesser extent, the right L4 nerve.  The patient did fail multiple forms of conservative care as outlined in my preoperative note.  His pain did continue and therefore we did discuss proceeding with the  procedure reflected above.  The patient was fully aware of the risks and limitations of the procedure as outlined in my preoperative note.  OPERATIVE DETAILS:  On January 04, 2015, the patient was brought to surgery and general endotracheal anesthesia was administered.  The patient was placed in the lateral decubitus position with the left side up.  The patient was secured to the bed using tape.  All bony prominences were padded.  The bed was gently flexed in order to optimize exposure to the L3-4 intervertebral space.  After prepping and draping in the usual fashion, and after performing a time-out procedure, I did make a left-sided transverse incision in line with the L3-4 intervertebral space.  I then dissected through the external and internal oblique musculature.  The transversalis fascia was identified and entered.  The retroperitoneal space was bluntly swept anteriorly. The psoas muscle was readily identified.  I then advanced the initial dilator through the psoas musculature.  I did use neurologic monitoring while advancing the dilator in order to ensure that there are no neurologic structures in the immediate vicinity of the dilator, there were not.  I then sequentially dilated.  I then advanced a retractor over the dilators.  The retractor was secured to the bed using a rigid arm.  I did  liberally use AP and lateral fluoroscopy to ensure appropriate positioning of the retractor and the dilators.  Once the dilator was secured into the appropriate position, it was slightly opened.  I was able to fully identify the intervertebral space at the L3- 4 level.  I then performed an annulotomy.  I then performed a thorough and complete L3-4 intervertebral diskectomy.  The contralateral anulus was released.  Once the endplates were appropriately prepared, I did place a series of interbody spacer trials and I did feel that a 10 x 22 x 50 mm interbody spacer would be the most appropriate  fit.  The appropriate size interbody spacer was then packed with DBX mix and tamped across the intervertebral space in the usual fashion.  I did note excellent restoration of the intervertebral height bilaterally.  I then proceeded with the anterior instrumentation aspect of the procedure.  A 2-hole plate was placed over the anterior spine at its lateral aspect. I then used an awl to create  a trajectory for vertebral body screws into the L3-L4 vertebral bodies.  I then placed 50-mm screws, again liberally using fluoroscopy.  I was very pleased with the purchase of each of the screws.  The screws were then locked into the plate.  The bed was then leveled and the plate was then locked into the appropriate position.  I then copiously irrigated the wound.  I was very pleased with the final AP and lateral fluoroscopic images.  I then removed the dilator.  I explored the wound for any undue bleeding, and there was none encountered.  The wound was then closed in layers using #1 Vicryl, followed by 2-0 Vicryl, followed by 3-0 Monocryl.  I then proceeded with the posterior fusion portion of the procedure.  I did make a left-sided paramedian incision just lateral to the L3 and L4 pedicles.  The posterior elements were identified and decorticated.  I then placed the remainder of the DBX mix along the posterior elements in order to aid in the success of the fusion.  I then advanced Jamshidi needles across the L3 and L4 pedicles.  Guidewires were then advanced through the Jamshidi needles.  I then used a 5-mm tap in order to tap the L3 and L4 pedicles, again using fluoroscopy.  I did use triggered EMG to test each of the taps, and there was no tap that tested below 20 milliamps.  I then placed 6 x 45 mm screws into the L3 and L4 pedicles.  A 50-mm rod was secured into the tulip heads of the screws.  Caps were then placed and a final locking procedure was performed.  Again, the wound was  copiously irrigated.  I was very pleased with the final AP and lateral fluoroscopic images.  The fascia was then closed using #1 Vicryl, and the subcutaneous layer was closed using 2-0 Vicryl, the skin was closed using 3-0 Monocryl.  Benzoin and Steri-Strips were applied followed by sterile dressing.  Of note, I did use neurologic monitoring throughout the surgery, and there was no sustained abnormal EMG activity noted throughout the surgery.  Of note, Pricilla Holm was my assistant throughout surgery, and did aid in retraction, suctioning, and closure from start to finish.     Phylliss Bob, MD     MD/MEDQ  D:  01/04/2015  T:  01/04/2015  Job:  [id number]  cc:   Lennie Odor, PA

## 2015-01-04 NOTE — Anesthesia Preprocedure Evaluation (Signed)
Anesthesia Evaluation  Patient identified by MRN, date of birth, ID band Patient awake    Reviewed: Allergy & Precautions, NPO status , Patient's Chart, lab work & pertinent test results  History of Anesthesia Complications Negative for: history of anesthetic complications  Airway Mallampati: II  TM Distance: >3 FB Neck ROM: Full    Dental  (+) Teeth Intact   Pulmonary neg shortness of breath, sleep apnea , neg COPD, neg recent URI, Current Smoker,    breath sounds clear to auscultation       Cardiovascular hypertension, Pt. on medications (-) angina(-) Past MI and (-) CHF (-) dysrhythmias  Rhythm:Regular     Neuro/Psych PSYCHIATRIC DISORDERS Depression Low back with right leg pain and numbness  Neuromuscular disease    GI/Hepatic Neg liver ROS, GERD  ,  Endo/Other  diabetes, Type 2, Oral Hypoglycemic AgentsHypothyroidism   Renal/GU negative Renal ROS     Musculoskeletal  (+) Arthritis ,   Abdominal   Peds  Hematology negative hematology ROS (+)   Anesthesia Other Findings   Reproductive/Obstetrics                             Anesthesia Physical Anesthesia Plan  ASA: II  Anesthesia Plan: General   Post-op Pain Management:    Induction: Intravenous  Airway Management Planned: Oral ETT  Additional Equipment: None  Intra-op Plan:   Post-operative Plan: Extubation in OR  Informed Consent: I have reviewed the patients History and Physical, chart, labs and discussed the procedure including the risks, benefits and alternatives for the proposed anesthesia with the patient or authorized representative who has indicated his/her understanding and acceptance.   Dental advisory given  Plan Discussed with: CRNA and Surgeon  Anesthesia Plan Comments:         Anesthesia Quick Evaluation

## 2015-01-04 NOTE — Progress Notes (Signed)
Utilization review completed.  

## 2015-01-05 ENCOUNTER — Encounter (HOSPITAL_COMMUNITY): Payer: Self-pay | Admitting: Orthopedic Surgery

## 2015-01-05 LAB — GLUCOSE, CAPILLARY
GLUCOSE-CAPILLARY: 157 mg/dL — AB (ref 65–99)
Glucose-Capillary: 178 mg/dL — ABNORMAL HIGH (ref 65–99)

## 2015-01-05 NOTE — Progress Notes (Signed)
Occupational Therapy Evaluation Patient Details Name: Steven Lawrence MRN: [id number] DOB: 28-Mar-1950 Today's Date: 01/05/2015    History of Present Illness Pt is a 64 y/o male who presents s/p L3-4 ALIF on 01/04/15.   Clinical Impression   Patient presents to OT with decreased ADL independence. All education complete regarding managing ADLs at home within back precautions. Wife to assist patient during his recovery. No further OT needs.    Follow Up Recommendations  No OT follow up;Supervision/Assistance - 24 hour    Equipment Recommendations  3 in 1 bedside comode ; single point cane   Recommendations for Other Services       Precautions / Restrictions Precautions Precautions: Fall;Back Precaution Booklet Issued: Yes (comment) Precaution Comments: reviewed all back precautions with patient and wife Restrictions Weight Bearing Restrictions: No      Mobility Bed Mobility Overal bed mobility: Modified Independent             General bed mobility comments: performed log roll technique without cues  Transfers Overall transfer level: Needs assistance Equipment used: None Transfers: Sit to/from Stand Sit to Stand: Supervision         General transfer comment: No physical assist required. Maintained precautions well.     Balance                                      ADL Overall ADL's : Needs assistance/impaired Eating/Feeding: Independent   Grooming: Set up;Sitting   Upper Body Bathing: Set up;Sitting   Lower Body Bathing: Minimal assistance;Sit to/from stand   Upper Body Dressing : Set up;Sitting   Lower Body Dressing: Minimal assistance;Moderate assistance;Sit to/from stand   Toilet Transfer: Supervision/safety;Ambulation;Comfort height toilet           Functional mobility during ADLs: Supervision/safety General ADL Comments: Patient educated on managing ADLs within back precautions. Was able to recall all precautions from  prior PT session. Patient educated on availability of AE for LB self-care, but reports his wife will assist for now and she confirms. Patient educated on toileting using 3 in 1 over existing toilet. Also educated on toilet hygiene options. Patient educated on use of 3 in 1 in walk-in shower and that wife should be the one to move it back and forth. Patient educated that wife to assist with showers for now. Wife agreeable to all; will purchase patient long sponge.      Vision     Perception     Praxis      Pertinent Vitals/Pain Pain Assessment: Faces Pain Score: 5  Faces Pain Scale: Hurts even more Pain Location: back incision Pain Descriptors / Indicators: Discomfort;Operative site guarding;Aching Pain Intervention(s): Limited activity within patient's tolerance;Monitored during session;Repositioned     Hand Dominance Right   Extremity/Trunk Assessment Upper Extremity Assessment Upper Extremity Assessment: Overall WFL for tasks assessed   Lower Extremity Assessment Lower Extremity Assessment: Defer to PT evaluation   Cervical / Trunk Assessment Cervical / Trunk Assessment: Other exceptions Cervical / Trunk Exceptions: Forward head posture   Communication Communication Communication: HOH   Cognition Arousal/Alertness: Awake/alert Behavior During Therapy: WFL for tasks assessed/performed Overall Cognitive Status: Within Functional Limits for tasks assessed                     General Comments       Exercises       Shoulder Instructions  Home Living Family/patient expects to be discharged to:: Private residence Living Arrangements: Spouse/significant other Available Help at Discharge: Family;Available 24 hours/day Type of Home: House Home Access: Stairs to enter [employer] of Steps: 3 Entrance Stairs-Rails: Left Home Layout: Two level;Able to live on main level with bedroom/bathroom     Bathroom Shower/Tub: Tub/shower unit;Walk-in  shower   Bathroom Toilet: Standard Bathroom Accessibility: Yes   Home Equipment: None          Prior Functioning/Environment Level of Independence: Independent             OT Diagnosis: Acute pain   OT Problem List: Decreased strength;Decreased knowledge of use of DME or AE;Decreased knowledge of precautions;Pain   OT Treatment/Interventions:      OT Goals(Current goals can be found in the care plan section) Acute Rehab OT Goals Patient Stated Goal: to get some rest OT Goal Formulation: All assessment and education complete, DC therapy  OT Frequency:     Barriers to D/C:            Co-evaluation              End of Session Nurse Communication: Other (comment) (questions about pain medications for home)  Activity Tolerance: Patient tolerated treatment well Patient left: in bed;with call bell/phone within reach;with family/visitor present   Time: 0902-0920 OT Time Calculation (min): 18 min Charges:  OT General Charges $OT Visit: 1 Procedure OT Evaluation $Initial OT Evaluation Tier I: 1 Procedure G-Codes:    Steven Lawrence A 2015-01-23, 9:51 AM

## 2015-01-05 NOTE — Progress Notes (Signed)
    Patient doing well Patient denies R or L leg pain Has been ambulating   Physical Exam: Filed Vitals:   01/05/15 0400  BP: 110/65  Pulse: 85  Temp: 98.7 F (37.1 C)  Resp: 18    Dressing in place NVI Patient looks comfortable  POD #1 s/p L3/4 A/P fusion, doing very well  - up with PT/OT, encourage ambulation - Percocet for pain, Valium for muscle spasms - likely d/c home today

## 2015-01-05 NOTE — Evaluation (Signed)
Physical Therapy Evaluation and Discharge Patient Details Name: Steven Lawrence MRN: [id number] DOB: 02-Apr-1950 Today's Date: 01/05/2015   History of Present Illness  Pt is a 64 y/o male who presents s/p L3-4 ALIF on 01/04/15.  Clinical Impression  Patient evaluated by Physical Therapy with no further acute PT needs identified. All education has been completed and the patient has no further questions. At the time of PT eval pt was able to perform transfers and ambulation with gross supervision for safety. Pt performing functional mobility well although stiff and guarded with pain. Will have 24 hour assist at home. See below for any follow-up Physial Therapy or equipment needs. PT is signing off. Thank you for this referral.     Follow Up Recommendations Outpatient PT (when appropriate per post-op protocol);Supervision for mobility/OOB    Equipment Recommendations  3in1 (PT) (Pt to get SPC from drug store)    Recommendations for Other Services       Precautions / Restrictions Precautions Precautions: Fall;Back Precaution Booklet Issued: Yes (comment) Precaution Comments: Discussed 3/3 back precautions with pt.  Restrictions Weight Bearing Restrictions: No      Mobility  Bed Mobility               General bed mobility comments: Pt sitting up in recliner. Per nursing performed log roll well.   Transfers Overall transfer level: Needs assistance Equipment used: None Transfers: Sit to/from Stand Sit to Stand: Supervision         General transfer comment: No physical assist required. Maintained precautions well.   Ambulation/Gait Ambulation/Gait assistance: Supervision Ambulation Distance (Feet): 400 Feet Assistive device: None Gait Pattern/deviations: Step-through pattern;Decreased stride length Gait velocity: Decreased Gait velocity interpretation: Below normal speed for age/gender General Gait Details: Grossly slow and guarded - appears very stiff. 3 standing rest  breaks due to fatigue, and occasionally held onto railing in hall for support.   Stairs Stairs: Yes Stairs assistance: Min guard Stair Management: One rail Left;Step to pattern;Forwards Number of Stairs: 4 General stair comments: VC's for sequencing and safety awareness. No physical assist required.   Wheelchair Mobility    Modified Rankin (Stroke Patients Only)       Balance Overall balance assessment: Needs assistance Sitting-balance support: Feet supported;No upper extremity supported Sitting balance-Leahy Scale: Good     Standing balance support: No upper extremity supported;During functional activity Standing balance-Leahy Scale: Fair Standing balance comment: Occasional unsteadiness noted with dynamic standing balance. No assist required to recover.                              Pertinent Vitals/Pain Pain Assessment: 0-10 Pain Score: 5  Pain Location: Incisional pain Pain Descriptors / Indicators: Operative site guarding;Discomfort Pain Intervention(s): Limited activity within patient's tolerance;Monitored during session;Repositioned    Home Living Family/patient expects to be discharged to:: Private residence Living Arrangements: Spouse/significant other Available Help at Discharge: Family;Available 24 hours/day Type of Home: House Home Access: Stairs to enter Entrance Stairs-Rails: Left Entrance Stairs-Number of Steps: 3 Home Layout: Two level;Able to live on main level with bedroom/bathroom Home Equipment: None      Prior Function Level of Independence: Independent               Hand Dominance   Dominant Hand: Right    Extremity/Trunk Assessment   Upper Extremity Assessment: Defer to OT evaluation           Lower Extremity Assessment: Generalized weakness  Cervical / Trunk Assessment: Other exceptions  Communication   Communication: HOH (Hearing aids)  Cognition Arousal/Alertness: Awake/alert Behavior During Therapy:  WFL for tasks assessed/performed Overall Cognitive Status: Within Functional Limits for tasks assessed                      General Comments      Exercises        Assessment/Plan    PT Assessment Patent does not need any further PT services  PT Diagnosis Difficulty walking;Acute pain   PT Problem List    PT Treatment Interventions     PT Goals (Current goals can be found in the Care Plan section) Acute Rehab PT Goals PT Goal Formulation: All assessment and education complete, DC therapy    Frequency     Barriers to discharge        Co-evaluation               End of Session   Activity Tolerance: Patient tolerated treatment well Patient left: in chair;with call bell/phone within reach Nurse Communication: Mobility status         Time: 2992-4268 PT Time Calculation (min) (ACUTE ONLY): 22 min   Charges:   PT Evaluation $Initial PT Evaluation Tier I: 1 Procedure     PT G CodesRolinda Roan January 27, 2015, 9:10 AM   Rolinda Roan, PT, DPT Acute Rehabilitation Services Pager: [phone]

## 2015-01-05 NOTE — Progress Notes (Signed)
Patient alert and oriented, mae's well, voiding adequate amount of urine, swallowing without difficulty, no c/o pain. Patient discharged home with family. Script and discharged instructions given to patient. Patient and family stated understanding of d/c instructions given and has an appointment with MD. 

## 2015-01-08 ENCOUNTER — Encounter (HOSPITAL_COMMUNITY): Payer: Self-pay | Admitting: Orthopedic Surgery

## 2015-01-19 NOTE — Discharge Summary (Signed)
Patient ID: Steven Lawrence MRN: [id number] DOB/AGE: May 23, 1950 64 y.o.  Admit date: 01/04/2015 Discharge date:  01/05/2015  Admission Diagnoses:  Active Problems:   Radiculopathy   Discharge Diagnoses:  Same  Past Medical History  Diagnosis Date  . Arthritis   . Urinary frequency   . BPH (benign prostatic hyperplasia)     mild  . Wears glasses   . Wears hearing aid     both ears  . Hypercholesteremia   . RLS (restless legs syndrome)   . Depression   . Heart murmur     with AVSC echo 2009  . Sleep apnea     does not use a cpap,primarily with upper airway resistance syndrome AHI 2.35/hr, RDI 10.2/hr ([name])  . Diabetes mellitus without complication ([place])     type II  . Hypothyroidism   . History of pneumonia as a child   . Acid reflux     Surgeries: Procedure(s): ANTERIOR LATERAL LUMBAR FUSION 1 LEVEL L3-4 POSTERIOR LUMBAR FUSION 1 LEVEL L3-4 on 01/04/2015   Consultants:  Vascular for exposure  Discharged Condition: Improved  Hospital Course: Steven Lawrence is an 64 y.o. male who was admitted 01/04/2015 for operative treatment of radiculopathy. Patient has severe unremitting pain that affects sleep, daily activities, and work/hobbies. After pre-op clearance the patient was taken to the operating room on 01/04/2015 and underwent  Procedure(s): ANTERIOR LATERAL LUMBAR FUSION 1 LEVEL L3-4 POSTERIOR LUMBAR FUSION 1 LEVEL L3-4.    Patient was given perioperative antibiotics:  Anti-infectives    Start     Dose/Rate Route Frequency Ordered Stop   01/04/15 1500  ceFAZolin (ANCEF) IVPB 1 g/50 mL premix     1 g 100 mL/hr over 30 Minutes Intravenous Every 8 hours 01/04/15 1316 01/04/15 2330   01/04/15 0615  ceFAZolin (ANCEF) 2-3 GM-% IVPB SOLR    Comments:  Block, Sarah   : cabinet override      01/04/15 0615 01/04/15 0750   01/04/15 0607  ceFAZolin (ANCEF) IVPB 2 g/50 mL premix  Status:  Discontinued     2 g 100 mL/hr over 30 Minutes Intravenous On call to O.R.  01/04/15 0607 01/04/15 1254       Patient was given sequential compression devices, early ambulation to prevent DVT.  Patient benefited maximally from hospital stay and there were no complications.    Recent vital signs: BP 127/74 mmHg  Pulse 85  Temp(Src) 98.8 F (37.1 C) (Oral)  Resp 18  SpO2 97%  Discharge Medications:     Medication List    TAKE these medications        aspirin 81 MG tablet  Take 81 mg by mouth daily.     atorvastatin 20 MG tablet  Commonly known as:  LIPITOR  Take 20 mg by mouth daily.     doxazosin 2 MG tablet  Commonly known as:  CARDURA  Take 2 mg by mouth every morning. Takes for urinary freq     INVOKANA 100 MG Tabs tablet  Generic drug:  canagliflozin  Take 100 mg by mouth daily.     levothyroxine 50 MCG tablet  Commonly known as:  SYNTHROID, LEVOTHROID  Take 50 mcg by mouth daily before breakfast.     lisinopril 10 MG tablet  Commonly known as:  PRINIVIL,ZESTRIL  Take 10 mg by mouth daily.     metFORMIN 1000 MG tablet  Commonly known as:  GLUCOPHAGE  Take 1,000 mg by mouth 2 (two) times daily  with a meal.     ONGLYZA 5 MG Tabs tablet  Generic drug:  saxagliptin HCl  Take 5 mg by mouth.     rOPINIRole 1 MG tablet  Commonly known as:  REQUIP  Take 2 mg by mouth at bedtime.        Diagnostic Studies: Dg Chest 2 View  12/27/2014  CLINICAL DATA:  Preoperative exam prior to lumbar fusion. History of diabetes, hypertension, current smoker. EXAM: CHEST  2 VIEW COMPARISON:  PA and lateral chest x-ray of September 26, 2014 and CT scan of the chest of October 03, 2014. FINDINGS: The lungs are mildly hyperinflated with hemidiaphragm flattening. There is no focal infiltrate. There is no pleural effusion. The heart and pulmonary vascularity are normal. The mediastinum is normal in width. The bony thorax exhibits no acute abnormality. IMPRESSION: Mild stable hyperinflation consistent with COPD. There is no active cardiopulmonary disease.  Electronically Signed   By: David  Martinique M.D.   On: 12/27/2014 15:04   Dg Lumbar Spine 2-3 Views  01/04/2015  CLINICAL DATA:  XLIF L3-4. EXAM: DG C-ARM GT 120 MIN; LUMBAR SPINE - 2-3 VIEW COMPARISON:  Abdominal CT 10/03/2014.  Lumbar MRI 07/28/2013. FLUOROSCOPY TIME:  C-arm fluoroscopic images were obtained intraoperatively and submitted for post operative interpretation. Please see the performing provider's procedural report for the fluoroscopy time utilized. FINDINGS: Spot frontal and lateral fluoroscopic images of the lumbar spine demonstrate interval XLIF at L3-4 from a left-sided approach. The hardware appears well positioned. There is restoration of disc height at L3-4. No complications identified. IMPRESSION: Intraoperative views following L3-4 XLIF. No demonstrated complication. Electronically Signed   By: Richardean Sale M.D.   On: 01/04/2015 14:21   Dg C-arm Gt 120 Min  01/04/2015  CLINICAL DATA:  XLIF L3-4. EXAM: DG C-ARM GT 120 MIN; LUMBAR SPINE - 2-3 VIEW COMPARISON:  Abdominal CT 10/03/2014.  Lumbar MRI 07/28/2013. FLUOROSCOPY TIME:  C-arm fluoroscopic images were obtained intraoperatively and submitted for post operative interpretation. Please see the performing provider's procedural report for the fluoroscopy time utilized. FINDINGS: Spot frontal and lateral fluoroscopic images of the lumbar spine demonstrate interval XLIF at L3-4 from a left-sided approach. The hardware appears well positioned. There is restoration of disc height at L3-4. No complications identified. IMPRESSION: Intraoperative views following L3-4 XLIF. No demonstrated complication. Electronically Signed   By: Richardean Sale M.D.   On: 01/04/2015 14:21    Disposition: 01-Home or Self Care   POD #1 s/p L3/4 A/P fusion, doing very well  - up with PT/OT, encourage ambulation - Percocet for pain, Valium for muscle spasms -Written scripts for pain signed and in chart -D/C instructions sheet printed and in chart -D/C  today  -F/U in office 2 weeks   Signed: Justice Britain 01/19/2015, 8:27 PM

## 2015-10-17 ENCOUNTER — Other Ambulatory Visit: Payer: Self-pay | Admitting: Physician Assistant

## 2015-10-17 DIAGNOSIS — E041 Nontoxic single thyroid nodule: Secondary | ICD-10-CM

## 2015-10-30 ENCOUNTER — Other Ambulatory Visit: Payer: 59

## 2015-11-02 ENCOUNTER — Ambulatory Visit
Admission: RE | Admit: 2015-11-02 | Discharge: 2015-11-02 | Disposition: A | Discharge status: Home / Self Care | Payer: Medicare Other | Source: Ambulatory Visit | Attending: Physician Assistant | Admitting: Physician Assistant

## 2015-11-02 DIAGNOSIS — E041 Nontoxic single thyroid nodule: Secondary | ICD-10-CM

## 2015-11-02 DIAGNOSIS — E042 Nontoxic multinodular goiter: Secondary | ICD-10-CM | POA: Diagnosis not present

## 2015-11-29 ENCOUNTER — Other Ambulatory Visit: Payer: Self-pay | Admitting: Physician Assistant

## 2015-11-29 DIAGNOSIS — Z0000 Encounter for general adult medical examination without abnormal findings: Secondary | ICD-10-CM | POA: Diagnosis not present

## 2015-11-29 DIAGNOSIS — Z125 Encounter for screening for malignant neoplasm of prostate: Secondary | ICD-10-CM | POA: Diagnosis not present

## 2015-11-29 DIAGNOSIS — H3500 Unspecified background retinopathy: Secondary | ICD-10-CM | POA: Diagnosis not present

## 2015-11-29 DIAGNOSIS — E039 Hypothyroidism, unspecified: Secondary | ICD-10-CM | POA: Diagnosis not present

## 2015-11-29 DIAGNOSIS — G4730 Sleep apnea, unspecified: Secondary | ICD-10-CM | POA: Diagnosis not present

## 2015-11-29 DIAGNOSIS — Z23 Encounter for immunization: Secondary | ICD-10-CM | POA: Diagnosis not present

## 2015-11-29 DIAGNOSIS — G2581 Restless legs syndrome: Secondary | ICD-10-CM | POA: Diagnosis not present

## 2015-11-29 DIAGNOSIS — E11319 Type 2 diabetes mellitus with unspecified diabetic retinopathy without macular edema: Secondary | ICD-10-CM | POA: Diagnosis not present

## 2015-11-29 DIAGNOSIS — E7800 Pure hypercholesterolemia, unspecified: Secondary | ICD-10-CM | POA: Diagnosis not present

## 2015-12-13 ENCOUNTER — Ambulatory Visit
Admission: RE | Admit: 2015-12-13 | Discharge: 2015-12-13 | Disposition: A | Discharge status: Home / Self Care | Payer: Medicare Other | Source: Ambulatory Visit | Attending: Physician Assistant | Admitting: Physician Assistant

## 2015-12-13 DIAGNOSIS — Z0000 Encounter for general adult medical examination without abnormal findings: Secondary | ICD-10-CM

## 2015-12-13 DIAGNOSIS — Z136 Encounter for screening for cardiovascular disorders: Secondary | ICD-10-CM | POA: Diagnosis not present

## 2015-12-13 DIAGNOSIS — Z87891 Personal history of nicotine dependence: Secondary | ICD-10-CM | POA: Diagnosis not present

## 2016-04-22 DIAGNOSIS — K529 Noninfective gastroenteritis and colitis, unspecified: Secondary | ICD-10-CM | POA: Diagnosis not present

## 2016-04-22 DIAGNOSIS — M25519 Pain in unspecified shoulder: Secondary | ICD-10-CM | POA: Diagnosis not present

## 2016-04-24 DIAGNOSIS — D225 Melanocytic nevi of trunk: Secondary | ICD-10-CM | POA: Diagnosis not present

## 2016-04-24 DIAGNOSIS — L821 Other seborrheic keratosis: Secondary | ICD-10-CM | POA: Diagnosis not present

## 2016-04-24 DIAGNOSIS — D1801 Hemangioma of skin and subcutaneous tissue: Secondary | ICD-10-CM | POA: Diagnosis not present

## 2016-04-24 DIAGNOSIS — L814 Other melanin hyperpigmentation: Secondary | ICD-10-CM | POA: Diagnosis not present

## 2016-04-24 DIAGNOSIS — Z23 Encounter for immunization: Secondary | ICD-10-CM | POA: Diagnosis not present

## 2016-04-24 DIAGNOSIS — Z86018 Personal history of other benign neoplasm: Secondary | ICD-10-CM | POA: Diagnosis not present

## 2016-07-09 DIAGNOSIS — H3500 Unspecified background retinopathy: Secondary | ICD-10-CM | POA: Diagnosis not present

## 2016-07-09 DIAGNOSIS — M25511 Pain in right shoulder: Secondary | ICD-10-CM | POA: Diagnosis not present

## 2016-07-09 DIAGNOSIS — E7800 Pure hypercholesterolemia, unspecified: Secondary | ICD-10-CM | POA: Diagnosis not present

## 2016-07-09 DIAGNOSIS — E039 Hypothyroidism, unspecified: Secondary | ICD-10-CM | POA: Diagnosis not present

## 2016-07-09 DIAGNOSIS — Z7984 Long term (current) use of oral hypoglycemic drugs: Secondary | ICD-10-CM | POA: Diagnosis not present

## 2016-07-09 DIAGNOSIS — E11319 Type 2 diabetes mellitus with unspecified diabetic retinopathy without macular edema: Secondary | ICD-10-CM | POA: Diagnosis not present

## 2016-07-12 DIAGNOSIS — M67911 Unspecified disorder of synovium and tendon, right shoulder: Secondary | ICD-10-CM | POA: Diagnosis not present

## 2016-07-19 DIAGNOSIS — S91312A Laceration without foreign body, left foot, initial encounter: Secondary | ICD-10-CM | POA: Diagnosis not present

## 2016-09-23 DIAGNOSIS — M5416 Radiculopathy, lumbar region: Secondary | ICD-10-CM | POA: Diagnosis not present

## 2016-10-02 DIAGNOSIS — M542 Cervicalgia: Secondary | ICD-10-CM | POA: Diagnosis not present

## 2016-10-07 DIAGNOSIS — M545 Low back pain: Secondary | ICD-10-CM | POA: Diagnosis not present

## 2016-10-16 DIAGNOSIS — G2581 Restless legs syndrome: Secondary | ICD-10-CM | POA: Diagnosis not present

## 2016-10-21 DIAGNOSIS — M545 Low back pain: Secondary | ICD-10-CM | POA: Diagnosis not present

## 2016-10-21 DIAGNOSIS — M5117 Intervertebral disc disorders with radiculopathy, lumbosacral region: Secondary | ICD-10-CM | POA: Diagnosis not present

## 2016-10-22 DIAGNOSIS — M67912 Unspecified disorder of synovium and tendon, left shoulder: Secondary | ICD-10-CM | POA: Diagnosis not present

## 2016-10-22 DIAGNOSIS — M67911 Unspecified disorder of synovium and tendon, right shoulder: Secondary | ICD-10-CM | POA: Diagnosis not present

## 2016-10-24 DIAGNOSIS — M5117 Intervertebral disc disorders with radiculopathy, lumbosacral region: Secondary | ICD-10-CM | POA: Diagnosis not present

## 2016-10-24 DIAGNOSIS — M545 Low back pain: Secondary | ICD-10-CM | POA: Diagnosis not present

## 2016-10-27 DIAGNOSIS — M5117 Intervertebral disc disorders with radiculopathy, lumbosacral region: Secondary | ICD-10-CM | POA: Diagnosis not present

## 2016-10-27 DIAGNOSIS — M545 Low back pain: Secondary | ICD-10-CM | POA: Diagnosis not present

## 2016-10-31 DIAGNOSIS — M545 Low back pain: Secondary | ICD-10-CM | POA: Diagnosis not present

## 2016-10-31 DIAGNOSIS — M5117 Intervertebral disc disorders with radiculopathy, lumbosacral region: Secondary | ICD-10-CM | POA: Diagnosis not present

## 2016-11-04 DIAGNOSIS — M545 Low back pain: Secondary | ICD-10-CM | POA: Diagnosis not present

## 2016-11-04 DIAGNOSIS — M5117 Intervertebral disc disorders with radiculopathy, lumbosacral region: Secondary | ICD-10-CM | POA: Diagnosis not present

## 2016-11-07 DIAGNOSIS — M5117 Intervertebral disc disorders with radiculopathy, lumbosacral region: Secondary | ICD-10-CM | POA: Diagnosis not present

## 2016-11-07 DIAGNOSIS — M545 Low back pain: Secondary | ICD-10-CM | POA: Diagnosis not present

## 2016-11-18 DIAGNOSIS — M5117 Intervertebral disc disorders with radiculopathy, lumbosacral region: Secondary | ICD-10-CM | POA: Diagnosis not present

## 2016-11-18 DIAGNOSIS — M545 Low back pain: Secondary | ICD-10-CM | POA: Diagnosis not present

## 2016-11-19 DIAGNOSIS — M67911 Unspecified disorder of synovium and tendon, right shoulder: Secondary | ICD-10-CM | POA: Diagnosis not present

## 2016-11-19 DIAGNOSIS — M67912 Unspecified disorder of synovium and tendon, left shoulder: Secondary | ICD-10-CM | POA: Diagnosis not present

## 2016-11-20 DIAGNOSIS — M5117 Intervertebral disc disorders with radiculopathy, lumbosacral region: Secondary | ICD-10-CM | POA: Diagnosis not present

## 2016-11-20 DIAGNOSIS — M545 Low back pain: Secondary | ICD-10-CM | POA: Diagnosis not present

## 2016-11-26 DIAGNOSIS — M545 Low back pain: Secondary | ICD-10-CM | POA: Diagnosis not present

## 2016-11-26 DIAGNOSIS — M5117 Intervertebral disc disorders with radiculopathy, lumbosacral region: Secondary | ICD-10-CM | POA: Diagnosis not present

## 2016-11-28 DIAGNOSIS — M25512 Pain in left shoulder: Secondary | ICD-10-CM | POA: Diagnosis not present

## 2016-11-28 DIAGNOSIS — M25511 Pain in right shoulder: Secondary | ICD-10-CM | POA: Diagnosis not present

## 2016-12-01 DIAGNOSIS — E039 Hypothyroidism, unspecified: Secondary | ICD-10-CM | POA: Diagnosis not present

## 2016-12-01 DIAGNOSIS — E113293 Type 2 diabetes mellitus with mild nonproliferative diabetic retinopathy without macular edema, bilateral: Secondary | ICD-10-CM | POA: Diagnosis not present

## 2016-12-01 DIAGNOSIS — Z720 Tobacco use: Secondary | ICD-10-CM | POA: Diagnosis not present

## 2016-12-01 DIAGNOSIS — Z23 Encounter for immunization: Secondary | ICD-10-CM | POA: Diagnosis not present

## 2016-12-01 DIAGNOSIS — G4730 Sleep apnea, unspecified: Secondary | ICD-10-CM | POA: Diagnosis not present

## 2016-12-01 DIAGNOSIS — N400 Enlarged prostate without lower urinary tract symptoms: Secondary | ICD-10-CM | POA: Diagnosis not present

## 2016-12-01 DIAGNOSIS — G2581 Restless legs syndrome: Secondary | ICD-10-CM | POA: Diagnosis not present

## 2016-12-01 DIAGNOSIS — Z7984 Long term (current) use of oral hypoglycemic drugs: Secondary | ICD-10-CM | POA: Diagnosis not present

## 2016-12-01 DIAGNOSIS — Z125 Encounter for screening for malignant neoplasm of prostate: Secondary | ICD-10-CM | POA: Diagnosis not present

## 2016-12-01 DIAGNOSIS — E7800 Pure hypercholesterolemia, unspecified: Secondary | ICD-10-CM | POA: Diagnosis not present

## 2016-12-01 DIAGNOSIS — Z0000 Encounter for general adult medical examination without abnormal findings: Secondary | ICD-10-CM | POA: Diagnosis not present

## 2016-12-02 DIAGNOSIS — M545 Low back pain: Secondary | ICD-10-CM | POA: Diagnosis not present

## 2016-12-02 DIAGNOSIS — M5117 Intervertebral disc disorders with radiculopathy, lumbosacral region: Secondary | ICD-10-CM | POA: Diagnosis not present

## 2016-12-05 DIAGNOSIS — M75122 Complete rotator cuff tear or rupture of left shoulder, not specified as traumatic: Secondary | ICD-10-CM | POA: Diagnosis not present

## 2016-12-05 DIAGNOSIS — M75121 Complete rotator cuff tear or rupture of right shoulder, not specified as traumatic: Secondary | ICD-10-CM | POA: Diagnosis not present

## 2016-12-10 DIAGNOSIS — M5117 Intervertebral disc disorders with radiculopathy, lumbosacral region: Secondary | ICD-10-CM | POA: Diagnosis not present

## 2016-12-10 DIAGNOSIS — M545 Low back pain: Secondary | ICD-10-CM | POA: Diagnosis not present

## 2016-12-11 DIAGNOSIS — M24111 Other articular cartilage disorders, right shoulder: Secondary | ICD-10-CM | POA: Diagnosis not present

## 2016-12-11 DIAGNOSIS — M7541 Impingement syndrome of right shoulder: Secondary | ICD-10-CM | POA: Diagnosis not present

## 2016-12-11 DIAGNOSIS — M94211 Chondromalacia, right shoulder: Secondary | ICD-10-CM | POA: Diagnosis not present

## 2016-12-11 DIAGNOSIS — M659 Synovitis and tenosynovitis, unspecified: Secondary | ICD-10-CM | POA: Diagnosis not present

## 2016-12-11 DIAGNOSIS — G8918 Other acute postprocedural pain: Secondary | ICD-10-CM | POA: Diagnosis not present

## 2016-12-11 DIAGNOSIS — M75121 Complete rotator cuff tear or rupture of right shoulder, not specified as traumatic: Secondary | ICD-10-CM | POA: Diagnosis not present

## 2016-12-19 DIAGNOSIS — M75121 Complete rotator cuff tear or rupture of right shoulder, not specified as traumatic: Secondary | ICD-10-CM | POA: Diagnosis not present

## 2016-12-19 DIAGNOSIS — G5602 Carpal tunnel syndrome, left upper limb: Secondary | ICD-10-CM | POA: Diagnosis not present

## 2016-12-29 DIAGNOSIS — M25511 Pain in right shoulder: Secondary | ICD-10-CM | POA: Diagnosis not present

## 2016-12-29 DIAGNOSIS — R531 Weakness: Secondary | ICD-10-CM | POA: Diagnosis not present

## 2016-12-29 DIAGNOSIS — M75101 Unspecified rotator cuff tear or rupture of right shoulder, not specified as traumatic: Secondary | ICD-10-CM | POA: Diagnosis not present

## 2017-01-01 DIAGNOSIS — M25511 Pain in right shoulder: Secondary | ICD-10-CM | POA: Diagnosis not present

## 2017-01-01 DIAGNOSIS — R531 Weakness: Secondary | ICD-10-CM | POA: Diagnosis not present

## 2017-01-01 DIAGNOSIS — M75101 Unspecified rotator cuff tear or rupture of right shoulder, not specified as traumatic: Secondary | ICD-10-CM | POA: Diagnosis not present

## 2017-01-06 DIAGNOSIS — M75101 Unspecified rotator cuff tear or rupture of right shoulder, not specified as traumatic: Secondary | ICD-10-CM | POA: Diagnosis not present

## 2017-01-06 DIAGNOSIS — M25511 Pain in right shoulder: Secondary | ICD-10-CM | POA: Diagnosis not present

## 2017-01-06 DIAGNOSIS — R531 Weakness: Secondary | ICD-10-CM | POA: Diagnosis not present

## 2017-01-09 DIAGNOSIS — R531 Weakness: Secondary | ICD-10-CM | POA: Diagnosis not present

## 2017-01-09 DIAGNOSIS — M75101 Unspecified rotator cuff tear or rupture of right shoulder, not specified as traumatic: Secondary | ICD-10-CM | POA: Diagnosis not present

## 2017-01-09 DIAGNOSIS — M25511 Pain in right shoulder: Secondary | ICD-10-CM | POA: Diagnosis not present

## 2017-01-14 DIAGNOSIS — M75101 Unspecified rotator cuff tear or rupture of right shoulder, not specified as traumatic: Secondary | ICD-10-CM | POA: Diagnosis not present

## 2017-01-14 DIAGNOSIS — R531 Weakness: Secondary | ICD-10-CM | POA: Diagnosis not present

## 2017-01-14 DIAGNOSIS — M25511 Pain in right shoulder: Secondary | ICD-10-CM | POA: Diagnosis not present

## 2017-01-16 DIAGNOSIS — M75122 Complete rotator cuff tear or rupture of left shoulder, not specified as traumatic: Secondary | ICD-10-CM | POA: Diagnosis not present

## 2017-02-10 DIAGNOSIS — M75122 Complete rotator cuff tear or rupture of left shoulder, not specified as traumatic: Secondary | ICD-10-CM | POA: Diagnosis not present

## 2017-02-10 DIAGNOSIS — G8918 Other acute postprocedural pain: Secondary | ICD-10-CM | POA: Diagnosis not present

## 2017-02-10 DIAGNOSIS — M7542 Impingement syndrome of left shoulder: Secondary | ICD-10-CM | POA: Diagnosis not present

## 2017-02-18 DIAGNOSIS — Z9889 Other specified postprocedural states: Secondary | ICD-10-CM | POA: Diagnosis not present

## 2017-03-18 DIAGNOSIS — M25512 Pain in left shoulder: Secondary | ICD-10-CM | POA: Diagnosis not present

## 2017-03-18 DIAGNOSIS — M75102 Unspecified rotator cuff tear or rupture of left shoulder, not specified as traumatic: Secondary | ICD-10-CM | POA: Diagnosis not present

## 2017-03-18 DIAGNOSIS — S46002D Unspecified injury of muscle(s) and tendon(s) of the rotator cuff of left shoulder, subsequent encounter: Secondary | ICD-10-CM | POA: Diagnosis not present

## 2017-03-20 DIAGNOSIS — M75102 Unspecified rotator cuff tear or rupture of left shoulder, not specified as traumatic: Secondary | ICD-10-CM | POA: Diagnosis not present

## 2017-03-20 DIAGNOSIS — S46002D Unspecified injury of muscle(s) and tendon(s) of the rotator cuff of left shoulder, subsequent encounter: Secondary | ICD-10-CM | POA: Diagnosis not present

## 2017-03-20 DIAGNOSIS — M25512 Pain in left shoulder: Secondary | ICD-10-CM | POA: Diagnosis not present

## 2017-03-23 DIAGNOSIS — S46002D Unspecified injury of muscle(s) and tendon(s) of the rotator cuff of left shoulder, subsequent encounter: Secondary | ICD-10-CM | POA: Diagnosis not present

## 2017-03-23 DIAGNOSIS — M25512 Pain in left shoulder: Secondary | ICD-10-CM | POA: Diagnosis not present

## 2017-03-23 DIAGNOSIS — M75102 Unspecified rotator cuff tear or rupture of left shoulder, not specified as traumatic: Secondary | ICD-10-CM | POA: Diagnosis not present

## 2017-03-25 DIAGNOSIS — Z9889 Other specified postprocedural states: Secondary | ICD-10-CM | POA: Diagnosis not present

## 2017-03-27 DIAGNOSIS — M25512 Pain in left shoulder: Secondary | ICD-10-CM | POA: Diagnosis not present

## 2017-03-27 DIAGNOSIS — M75102 Unspecified rotator cuff tear or rupture of left shoulder, not specified as traumatic: Secondary | ICD-10-CM | POA: Diagnosis not present

## 2017-03-27 DIAGNOSIS — S46002D Unspecified injury of muscle(s) and tendon(s) of the rotator cuff of left shoulder, subsequent encounter: Secondary | ICD-10-CM | POA: Diagnosis not present

## 2017-04-01 DIAGNOSIS — M25512 Pain in left shoulder: Secondary | ICD-10-CM | POA: Diagnosis not present

## 2017-04-01 DIAGNOSIS — S46002D Unspecified injury of muscle(s) and tendon(s) of the rotator cuff of left shoulder, subsequent encounter: Secondary | ICD-10-CM | POA: Diagnosis not present

## 2017-04-01 DIAGNOSIS — M75102 Unspecified rotator cuff tear or rupture of left shoulder, not specified as traumatic: Secondary | ICD-10-CM | POA: Diagnosis not present

## 2017-04-03 DIAGNOSIS — M25512 Pain in left shoulder: Secondary | ICD-10-CM | POA: Diagnosis not present

## 2017-04-03 DIAGNOSIS — M75102 Unspecified rotator cuff tear or rupture of left shoulder, not specified as traumatic: Secondary | ICD-10-CM | POA: Diagnosis not present

## 2017-04-03 DIAGNOSIS — S46002D Unspecified injury of muscle(s) and tendon(s) of the rotator cuff of left shoulder, subsequent encounter: Secondary | ICD-10-CM | POA: Diagnosis not present

## 2017-04-07 DIAGNOSIS — S46002D Unspecified injury of muscle(s) and tendon(s) of the rotator cuff of left shoulder, subsequent encounter: Secondary | ICD-10-CM | POA: Diagnosis not present

## 2017-04-07 DIAGNOSIS — M75102 Unspecified rotator cuff tear or rupture of left shoulder, not specified as traumatic: Secondary | ICD-10-CM | POA: Diagnosis not present

## 2017-04-07 DIAGNOSIS — M25512 Pain in left shoulder: Secondary | ICD-10-CM | POA: Diagnosis not present

## 2017-04-10 DIAGNOSIS — M25512 Pain in left shoulder: Secondary | ICD-10-CM | POA: Diagnosis not present

## 2017-04-10 DIAGNOSIS — M75102 Unspecified rotator cuff tear or rupture of left shoulder, not specified as traumatic: Secondary | ICD-10-CM | POA: Diagnosis not present

## 2017-04-10 DIAGNOSIS — S46002D Unspecified injury of muscle(s) and tendon(s) of the rotator cuff of left shoulder, subsequent encounter: Secondary | ICD-10-CM | POA: Diagnosis not present

## 2017-04-15 DIAGNOSIS — E113293 Type 2 diabetes mellitus with mild nonproliferative diabetic retinopathy without macular edema, bilateral: Secondary | ICD-10-CM | POA: Diagnosis not present

## 2017-04-17 DIAGNOSIS — M75102 Unspecified rotator cuff tear or rupture of left shoulder, not specified as traumatic: Secondary | ICD-10-CM | POA: Diagnosis not present

## 2017-04-17 DIAGNOSIS — S46002D Unspecified injury of muscle(s) and tendon(s) of the rotator cuff of left shoulder, subsequent encounter: Secondary | ICD-10-CM | POA: Diagnosis not present

## 2017-04-17 DIAGNOSIS — M25512 Pain in left shoulder: Secondary | ICD-10-CM | POA: Diagnosis not present

## 2017-04-22 DIAGNOSIS — M75102 Unspecified rotator cuff tear or rupture of left shoulder, not specified as traumatic: Secondary | ICD-10-CM | POA: Diagnosis not present

## 2017-04-22 DIAGNOSIS — S46002D Unspecified injury of muscle(s) and tendon(s) of the rotator cuff of left shoulder, subsequent encounter: Secondary | ICD-10-CM | POA: Diagnosis not present

## 2017-04-22 DIAGNOSIS — M25512 Pain in left shoulder: Secondary | ICD-10-CM | POA: Diagnosis not present

## 2017-04-24 DIAGNOSIS — M75102 Unspecified rotator cuff tear or rupture of left shoulder, not specified as traumatic: Secondary | ICD-10-CM | POA: Diagnosis not present

## 2017-04-24 DIAGNOSIS — S46002D Unspecified injury of muscle(s) and tendon(s) of the rotator cuff of left shoulder, subsequent encounter: Secondary | ICD-10-CM | POA: Diagnosis not present

## 2017-04-24 DIAGNOSIS — M25512 Pain in left shoulder: Secondary | ICD-10-CM | POA: Diagnosis not present

## 2017-04-28 DIAGNOSIS — S46002D Unspecified injury of muscle(s) and tendon(s) of the rotator cuff of left shoulder, subsequent encounter: Secondary | ICD-10-CM | POA: Diagnosis not present

## 2017-04-28 DIAGNOSIS — M25512 Pain in left shoulder: Secondary | ICD-10-CM | POA: Diagnosis not present

## 2017-04-28 DIAGNOSIS — M75102 Unspecified rotator cuff tear or rupture of left shoulder, not specified as traumatic: Secondary | ICD-10-CM | POA: Diagnosis not present

## 2017-04-30 DIAGNOSIS — Z23 Encounter for immunization: Secondary | ICD-10-CM | POA: Diagnosis not present

## 2017-04-30 DIAGNOSIS — D225 Melanocytic nevi of trunk: Secondary | ICD-10-CM | POA: Diagnosis not present

## 2017-04-30 DIAGNOSIS — D1801 Hemangioma of skin and subcutaneous tissue: Secondary | ICD-10-CM | POA: Diagnosis not present

## 2017-04-30 DIAGNOSIS — L821 Other seborrheic keratosis: Secondary | ICD-10-CM | POA: Diagnosis not present

## 2017-04-30 DIAGNOSIS — L814 Other melanin hyperpigmentation: Secondary | ICD-10-CM | POA: Diagnosis not present

## 2017-04-30 DIAGNOSIS — Z86018 Personal history of other benign neoplasm: Secondary | ICD-10-CM | POA: Diagnosis not present

## 2017-05-01 DIAGNOSIS — M75102 Unspecified rotator cuff tear or rupture of left shoulder, not specified as traumatic: Secondary | ICD-10-CM | POA: Diagnosis not present

## 2017-05-01 DIAGNOSIS — M25512 Pain in left shoulder: Secondary | ICD-10-CM | POA: Diagnosis not present

## 2017-05-01 DIAGNOSIS — S46002D Unspecified injury of muscle(s) and tendon(s) of the rotator cuff of left shoulder, subsequent encounter: Secondary | ICD-10-CM | POA: Diagnosis not present

## 2017-05-04 DIAGNOSIS — M25512 Pain in left shoulder: Secondary | ICD-10-CM | POA: Diagnosis not present

## 2017-05-04 DIAGNOSIS — S46002D Unspecified injury of muscle(s) and tendon(s) of the rotator cuff of left shoulder, subsequent encounter: Secondary | ICD-10-CM | POA: Diagnosis not present

## 2017-05-04 DIAGNOSIS — M75102 Unspecified rotator cuff tear or rupture of left shoulder, not specified as traumatic: Secondary | ICD-10-CM | POA: Diagnosis not present

## 2017-05-06 DIAGNOSIS — G5601 Carpal tunnel syndrome, right upper limb: Secondary | ICD-10-CM | POA: Diagnosis not present

## 2017-05-08 DIAGNOSIS — M75102 Unspecified rotator cuff tear or rupture of left shoulder, not specified as traumatic: Secondary | ICD-10-CM | POA: Diagnosis not present

## 2017-05-08 DIAGNOSIS — S46002D Unspecified injury of muscle(s) and tendon(s) of the rotator cuff of left shoulder, subsequent encounter: Secondary | ICD-10-CM | POA: Diagnosis not present

## 2017-05-08 DIAGNOSIS — M25512 Pain in left shoulder: Secondary | ICD-10-CM | POA: Diagnosis not present

## 2017-05-12 DIAGNOSIS — R200 Anesthesia of skin: Secondary | ICD-10-CM | POA: Diagnosis not present

## 2017-05-12 DIAGNOSIS — Z716 Tobacco abuse counseling: Secondary | ICD-10-CM | POA: Diagnosis not present

## 2017-05-12 DIAGNOSIS — F17210 Nicotine dependence, cigarettes, uncomplicated: Secondary | ICD-10-CM | POA: Diagnosis not present

## 2017-05-13 DIAGNOSIS — M25512 Pain in left shoulder: Secondary | ICD-10-CM | POA: Diagnosis not present

## 2017-05-13 DIAGNOSIS — S46002D Unspecified injury of muscle(s) and tendon(s) of the rotator cuff of left shoulder, subsequent encounter: Secondary | ICD-10-CM | POA: Diagnosis not present

## 2017-05-13 DIAGNOSIS — M75102 Unspecified rotator cuff tear or rupture of left shoulder, not specified as traumatic: Secondary | ICD-10-CM | POA: Diagnosis not present

## 2017-05-15 DIAGNOSIS — S46002D Unspecified injury of muscle(s) and tendon(s) of the rotator cuff of left shoulder, subsequent encounter: Secondary | ICD-10-CM | POA: Diagnosis not present

## 2017-05-15 DIAGNOSIS — M25512 Pain in left shoulder: Secondary | ICD-10-CM | POA: Diagnosis not present

## 2017-05-15 DIAGNOSIS — M75102 Unspecified rotator cuff tear or rupture of left shoulder, not specified as traumatic: Secondary | ICD-10-CM | POA: Diagnosis not present

## 2017-05-19 DIAGNOSIS — S46002D Unspecified injury of muscle(s) and tendon(s) of the rotator cuff of left shoulder, subsequent encounter: Secondary | ICD-10-CM | POA: Diagnosis not present

## 2017-05-19 DIAGNOSIS — M75102 Unspecified rotator cuff tear or rupture of left shoulder, not specified as traumatic: Secondary | ICD-10-CM | POA: Diagnosis not present

## 2017-05-19 DIAGNOSIS — M25512 Pain in left shoulder: Secondary | ICD-10-CM | POA: Diagnosis not present

## 2017-05-22 DIAGNOSIS — M75102 Unspecified rotator cuff tear or rupture of left shoulder, not specified as traumatic: Secondary | ICD-10-CM | POA: Diagnosis not present

## 2017-05-22 DIAGNOSIS — M25512 Pain in left shoulder: Secondary | ICD-10-CM | POA: Diagnosis not present

## 2017-05-22 DIAGNOSIS — S46002D Unspecified injury of muscle(s) and tendon(s) of the rotator cuff of left shoulder, subsequent encounter: Secondary | ICD-10-CM | POA: Diagnosis not present

## 2017-05-27 DIAGNOSIS — M75102 Unspecified rotator cuff tear or rupture of left shoulder, not specified as traumatic: Secondary | ICD-10-CM | POA: Diagnosis not present

## 2017-05-27 DIAGNOSIS — M25512 Pain in left shoulder: Secondary | ICD-10-CM | POA: Diagnosis not present

## 2017-05-27 DIAGNOSIS — S46002D Unspecified injury of muscle(s) and tendon(s) of the rotator cuff of left shoulder, subsequent encounter: Secondary | ICD-10-CM | POA: Diagnosis not present

## 2017-05-29 DIAGNOSIS — S46002D Unspecified injury of muscle(s) and tendon(s) of the rotator cuff of left shoulder, subsequent encounter: Secondary | ICD-10-CM | POA: Diagnosis not present

## 2017-05-29 DIAGNOSIS — M25512 Pain in left shoulder: Secondary | ICD-10-CM | POA: Diagnosis not present

## 2017-05-29 DIAGNOSIS — M75102 Unspecified rotator cuff tear or rupture of left shoulder, not specified as traumatic: Secondary | ICD-10-CM | POA: Diagnosis not present

## 2017-06-01 DIAGNOSIS — E11319 Type 2 diabetes mellitus with unspecified diabetic retinopathy without macular edema: Secondary | ICD-10-CM | POA: Diagnosis not present

## 2017-06-01 DIAGNOSIS — E039 Hypothyroidism, unspecified: Secondary | ICD-10-CM | POA: Diagnosis not present

## 2017-06-01 DIAGNOSIS — Z794 Long term (current) use of insulin: Secondary | ICD-10-CM | POA: Diagnosis not present

## 2017-06-01 DIAGNOSIS — E7800 Pure hypercholesterolemia, unspecified: Secondary | ICD-10-CM | POA: Diagnosis not present

## 2017-06-17 DIAGNOSIS — G5601 Carpal tunnel syndrome, right upper limb: Secondary | ICD-10-CM | POA: Diagnosis not present

## 2017-07-01 DIAGNOSIS — G5601 Carpal tunnel syndrome, right upper limb: Secondary | ICD-10-CM | POA: Diagnosis not present

## 2017-09-24 DIAGNOSIS — Z794 Long term (current) use of insulin: Secondary | ICD-10-CM | POA: Diagnosis not present

## 2017-09-24 DIAGNOSIS — E1169 Type 2 diabetes mellitus with other specified complication: Secondary | ICD-10-CM | POA: Diagnosis not present

## 2017-12-29 DIAGNOSIS — E7800 Pure hypercholesterolemia, unspecified: Secondary | ICD-10-CM | POA: Diagnosis not present

## 2017-12-29 DIAGNOSIS — H3500 Unspecified background retinopathy: Secondary | ICD-10-CM | POA: Diagnosis not present

## 2017-12-29 DIAGNOSIS — Z125 Encounter for screening for malignant neoplasm of prostate: Secondary | ICD-10-CM | POA: Diagnosis not present

## 2017-12-29 DIAGNOSIS — Z23 Encounter for immunization: Secondary | ICD-10-CM | POA: Diagnosis not present

## 2017-12-29 DIAGNOSIS — G4730 Sleep apnea, unspecified: Secondary | ICD-10-CM | POA: Diagnosis not present

## 2017-12-29 DIAGNOSIS — E039 Hypothyroidism, unspecified: Secondary | ICD-10-CM | POA: Diagnosis not present

## 2017-12-29 DIAGNOSIS — Z0000 Encounter for general adult medical examination without abnormal findings: Secondary | ICD-10-CM | POA: Diagnosis not present

## 2017-12-29 DIAGNOSIS — Z720 Tobacco use: Secondary | ICD-10-CM | POA: Diagnosis not present

## 2017-12-29 DIAGNOSIS — E1169 Type 2 diabetes mellitus with other specified complication: Secondary | ICD-10-CM | POA: Diagnosis not present

## 2017-12-29 DIAGNOSIS — G2581 Restless legs syndrome: Secondary | ICD-10-CM | POA: Diagnosis not present

## 2018-01-06 DIAGNOSIS — Z09 Encounter for follow-up examination after completed treatment for conditions other than malignant neoplasm: Secondary | ICD-10-CM | POA: Diagnosis not present

## 2018-04-01 DIAGNOSIS — E039 Hypothyroidism, unspecified: Secondary | ICD-10-CM | POA: Diagnosis not present

## 2018-04-15 DIAGNOSIS — E113293 Type 2 diabetes mellitus with mild nonproliferative diabetic retinopathy without macular edema, bilateral: Secondary | ICD-10-CM | POA: Diagnosis not present

## 2018-05-03 DIAGNOSIS — Z86018 Personal history of other benign neoplasm: Secondary | ICD-10-CM | POA: Diagnosis not present

## 2018-05-03 DIAGNOSIS — Z23 Encounter for immunization: Secondary | ICD-10-CM | POA: Diagnosis not present

## 2018-05-03 DIAGNOSIS — D225 Melanocytic nevi of trunk: Secondary | ICD-10-CM | POA: Diagnosis not present

## 2018-05-03 DIAGNOSIS — L700 Acne vulgaris: Secondary | ICD-10-CM | POA: Diagnosis not present

## 2018-05-03 DIAGNOSIS — L821 Other seborrheic keratosis: Secondary | ICD-10-CM | POA: Diagnosis not present

## 2018-05-03 DIAGNOSIS — L814 Other melanin hyperpigmentation: Secondary | ICD-10-CM | POA: Diagnosis not present

## 2018-05-27 DIAGNOSIS — H47323 Drusen of optic disc, bilateral: Secondary | ICD-10-CM | POA: Diagnosis not present

## 2018-05-27 DIAGNOSIS — H2513 Age-related nuclear cataract, bilateral: Secondary | ICD-10-CM | POA: Diagnosis not present

## 2018-05-27 DIAGNOSIS — H11003 Unspecified pterygium of eye, bilateral: Secondary | ICD-10-CM | POA: Diagnosis not present

## 2018-05-27 DIAGNOSIS — E119 Type 2 diabetes mellitus without complications: Secondary | ICD-10-CM | POA: Diagnosis not present

## 2018-07-08 DIAGNOSIS — E039 Hypothyroidism, unspecified: Secondary | ICD-10-CM | POA: Diagnosis not present

## 2018-07-08 DIAGNOSIS — N400 Enlarged prostate without lower urinary tract symptoms: Secondary | ICD-10-CM | POA: Diagnosis not present

## 2018-07-08 DIAGNOSIS — G2581 Restless legs syndrome: Secondary | ICD-10-CM | POA: Diagnosis not present

## 2018-07-08 DIAGNOSIS — G4730 Sleep apnea, unspecified: Secondary | ICD-10-CM | POA: Diagnosis not present

## 2018-07-08 DIAGNOSIS — Z720 Tobacco use: Secondary | ICD-10-CM | POA: Diagnosis not present

## 2018-07-08 DIAGNOSIS — E7800 Pure hypercholesterolemia, unspecified: Secondary | ICD-10-CM | POA: Diagnosis not present

## 2018-07-08 DIAGNOSIS — H3500 Unspecified background retinopathy: Secondary | ICD-10-CM | POA: Diagnosis not present

## 2018-07-08 DIAGNOSIS — E1169 Type 2 diabetes mellitus with other specified complication: Secondary | ICD-10-CM | POA: Diagnosis not present

## 2018-07-29 DIAGNOSIS — Z794 Long term (current) use of insulin: Secondary | ICD-10-CM | POA: Diagnosis not present

## 2018-07-29 DIAGNOSIS — E039 Hypothyroidism, unspecified: Secondary | ICD-10-CM | POA: Diagnosis not present

## 2018-07-29 DIAGNOSIS — E7800 Pure hypercholesterolemia, unspecified: Secondary | ICD-10-CM | POA: Diagnosis not present

## 2018-07-29 DIAGNOSIS — E1169 Type 2 diabetes mellitus with other specified complication: Secondary | ICD-10-CM | POA: Diagnosis not present

## 2018-08-20 DIAGNOSIS — H11002 Unspecified pterygium of left eye: Secondary | ICD-10-CM | POA: Diagnosis not present

## 2018-11-05 DIAGNOSIS — E119 Type 2 diabetes mellitus without complications: Secondary | ICD-10-CM | POA: Diagnosis not present

## 2018-11-05 DIAGNOSIS — H2511 Age-related nuclear cataract, right eye: Secondary | ICD-10-CM | POA: Diagnosis not present

## 2018-11-05 DIAGNOSIS — H2512 Age-related nuclear cataract, left eye: Secondary | ICD-10-CM | POA: Diagnosis not present

## 2018-12-15 DIAGNOSIS — G5622 Lesion of ulnar nerve, left upper limb: Secondary | ICD-10-CM | POA: Diagnosis not present

## 2018-12-15 DIAGNOSIS — H2511 Age-related nuclear cataract, right eye: Secondary | ICD-10-CM | POA: Diagnosis not present

## 2018-12-15 DIAGNOSIS — Z01818 Encounter for other preprocedural examination: Secondary | ICD-10-CM | POA: Diagnosis not present

## 2018-12-15 DIAGNOSIS — H2512 Age-related nuclear cataract, left eye: Secondary | ICD-10-CM | POA: Diagnosis not present

## 2018-12-23 DIAGNOSIS — H2512 Age-related nuclear cataract, left eye: Secondary | ICD-10-CM | POA: Diagnosis not present

## 2018-12-23 DIAGNOSIS — H25812 Combined forms of age-related cataract, left eye: Secondary | ICD-10-CM | POA: Diagnosis not present

## 2019-01-03 DIAGNOSIS — E039 Hypothyroidism, unspecified: Secondary | ICD-10-CM | POA: Diagnosis not present

## 2019-01-03 DIAGNOSIS — K529 Noninfective gastroenteritis and colitis, unspecified: Secondary | ICD-10-CM | POA: Diagnosis not present

## 2019-01-03 DIAGNOSIS — Z23 Encounter for immunization: Secondary | ICD-10-CM | POA: Diagnosis not present

## 2019-01-03 DIAGNOSIS — G2581 Restless legs syndrome: Secondary | ICD-10-CM | POA: Diagnosis not present

## 2019-01-03 DIAGNOSIS — H3500 Unspecified background retinopathy: Secondary | ICD-10-CM | POA: Diagnosis not present

## 2019-01-03 DIAGNOSIS — Z125 Encounter for screening for malignant neoplasm of prostate: Secondary | ICD-10-CM | POA: Diagnosis not present

## 2019-01-03 DIAGNOSIS — E1169 Type 2 diabetes mellitus with other specified complication: Secondary | ICD-10-CM | POA: Diagnosis not present

## 2019-01-03 DIAGNOSIS — Z720 Tobacco use: Secondary | ICD-10-CM | POA: Diagnosis not present

## 2019-01-03 DIAGNOSIS — E7800 Pure hypercholesterolemia, unspecified: Secondary | ICD-10-CM | POA: Diagnosis not present

## 2019-01-03 DIAGNOSIS — G4730 Sleep apnea, unspecified: Secondary | ICD-10-CM | POA: Diagnosis not present

## 2019-01-03 DIAGNOSIS — Z0000 Encounter for general adult medical examination without abnormal findings: Secondary | ICD-10-CM | POA: Diagnosis not present

## 2019-01-03 DIAGNOSIS — N400 Enlarged prostate without lower urinary tract symptoms: Secondary | ICD-10-CM | POA: Diagnosis not present

## 2019-01-05 DIAGNOSIS — G5622 Lesion of ulnar nerve, left upper limb: Secondary | ICD-10-CM | POA: Diagnosis not present

## 2019-01-06 DIAGNOSIS — H25811 Combined forms of age-related cataract, right eye: Secondary | ICD-10-CM | POA: Diagnosis not present

## 2019-01-06 DIAGNOSIS — H2511 Age-related nuclear cataract, right eye: Secondary | ICD-10-CM | POA: Diagnosis not present

## 2019-02-04 DIAGNOSIS — H11042 Peripheral pterygium, stationary, left eye: Secondary | ICD-10-CM | POA: Diagnosis not present

## 2019-04-05 DIAGNOSIS — Z7984 Long term (current) use of oral hypoglycemic drugs: Secondary | ICD-10-CM | POA: Diagnosis not present

## 2019-04-05 DIAGNOSIS — E1169 Type 2 diabetes mellitus with other specified complication: Secondary | ICD-10-CM | POA: Diagnosis not present

## 2019-04-06 ENCOUNTER — Ambulatory Visit: Payer: Medicare Other | Attending: Internal Medicine

## 2019-04-06 DIAGNOSIS — Z23 Encounter for immunization: Secondary | ICD-10-CM | POA: Diagnosis not present

## 2019-04-06 NOTE — Progress Notes (Signed)
   Covid-19 Vaccination Clinic  Name:  Steven Lawrence    MRN: [id number] DOB: [birth year]  04/06/2019  Steven Lawrence was observed post Covid-19 immunization for 15 minutes without incidence. He was provided with Vaccine Information Sheet and instruction to access the V-Safe system.   Steven Lawrence was instructed to call 911 with any severe reactions post vaccine: Marland Kitchen Difficulty breathing  . Swelling of your face and throat  . A fast heartbeat  . A bad rash all over your body  . Dizziness and weakness    Immunizations Administered    Name Date Dose VIS Date Route   Pfizer COVID-19 Vaccine 04/06/2019  9:43 AM 0.3 mL 02/25/2019 Intramuscular   Manufacturer: [place]   Lot: [id number]   [place]: [id number]

## 2019-04-25 ENCOUNTER — Ambulatory Visit: Payer: Medicare Other | Attending: Internal Medicine

## 2019-04-25 DIAGNOSIS — Z23 Encounter for immunization: Secondary | ICD-10-CM | POA: Insufficient documentation

## 2019-04-25 NOTE — Progress Notes (Signed)
   Covid-19 Vaccination Clinic  Name:  Steven Lawrence    MRN: [id number] DOB: Nov 02, 1950  04/25/2019  [name] was observed post Covid-19 immunization for 15 minutes without incidence. He was provided with Vaccine Information Sheet and instruction to access the V-Safe system.   [name] was instructed to call 911 with any severe reactions post vaccine: Marland Kitchen Difficulty breathing  . Swelling of your face and throat  . A fast heartbeat  . A bad rash all over your body  . Dizziness and weakness    Immunizations Administered    Name Date Dose VIS Date Route   Pfizer COVID-19 Vaccine 04/25/2019 12:56 PM 0.3 mL 02/25/2019 Intramuscular   Manufacturer: [place]   Lot: [id number]   [place]: [id number]

## 2019-05-10 DIAGNOSIS — Z86018 Personal history of other benign neoplasm: Secondary | ICD-10-CM | POA: Diagnosis not present

## 2019-05-10 DIAGNOSIS — D225 Melanocytic nevi of trunk: Secondary | ICD-10-CM | POA: Diagnosis not present

## 2019-05-10 DIAGNOSIS — Z23 Encounter for immunization: Secondary | ICD-10-CM | POA: Diagnosis not present

## 2019-05-10 DIAGNOSIS — L821 Other seborrheic keratosis: Secondary | ICD-10-CM | POA: Diagnosis not present

## 2019-05-10 DIAGNOSIS — B078 Other viral warts: Secondary | ICD-10-CM | POA: Diagnosis not present

## 2019-05-10 DIAGNOSIS — L814 Other melanin hyperpigmentation: Secondary | ICD-10-CM | POA: Diagnosis not present

## 2019-05-10 DIAGNOSIS — L578 Other skin changes due to chronic exposure to nonionizing radiation: Secondary | ICD-10-CM | POA: Diagnosis not present

## 2019-05-10 DIAGNOSIS — L570 Actinic keratosis: Secondary | ICD-10-CM | POA: Diagnosis not present

## 2019-05-10 DIAGNOSIS — D1801 Hemangioma of skin and subcutaneous tissue: Secondary | ICD-10-CM | POA: Diagnosis not present

## 2019-05-23 DIAGNOSIS — M25561 Pain in right knee: Secondary | ICD-10-CM | POA: Diagnosis not present

## 2019-06-03 DIAGNOSIS — M25561 Pain in right knee: Secondary | ICD-10-CM | POA: Diagnosis not present

## 2019-06-27 DIAGNOSIS — M25561 Pain in right knee: Secondary | ICD-10-CM | POA: Diagnosis not present

## 2019-07-04 DIAGNOSIS — R011 Cardiac murmur, unspecified: Secondary | ICD-10-CM | POA: Diagnosis not present

## 2019-07-04 DIAGNOSIS — G2581 Restless legs syndrome: Secondary | ICD-10-CM | POA: Diagnosis not present

## 2019-07-04 DIAGNOSIS — E1169 Type 2 diabetes mellitus with other specified complication: Secondary | ICD-10-CM | POA: Diagnosis not present

## 2019-07-04 DIAGNOSIS — E039 Hypothyroidism, unspecified: Secondary | ICD-10-CM | POA: Diagnosis not present

## 2019-07-04 DIAGNOSIS — Z7984 Long term (current) use of oral hypoglycemic drugs: Secondary | ICD-10-CM | POA: Diagnosis not present

## 2019-07-04 DIAGNOSIS — Z122 Encounter for screening for malignant neoplasm of respiratory organs: Secondary | ICD-10-CM | POA: Diagnosis not present

## 2019-07-04 DIAGNOSIS — G4730 Sleep apnea, unspecified: Secondary | ICD-10-CM | POA: Diagnosis not present

## 2019-07-04 DIAGNOSIS — H3500 Unspecified background retinopathy: Secondary | ICD-10-CM | POA: Diagnosis not present

## 2019-07-04 DIAGNOSIS — E7800 Pure hypercholesterolemia, unspecified: Secondary | ICD-10-CM | POA: Diagnosis not present

## 2019-07-04 DIAGNOSIS — F17200 Nicotine dependence, unspecified, uncomplicated: Secondary | ICD-10-CM | POA: Diagnosis not present

## 2019-07-04 DIAGNOSIS — E113293 Type 2 diabetes mellitus with mild nonproliferative diabetic retinopathy without macular edema, bilateral: Secondary | ICD-10-CM | POA: Diagnosis not present

## 2019-07-06 DIAGNOSIS — M25561 Pain in right knee: Secondary | ICD-10-CM | POA: Diagnosis not present

## 2019-07-08 ENCOUNTER — Other Ambulatory Visit: Payer: Self-pay | Admitting: *Deleted

## 2019-07-08 DIAGNOSIS — F17210 Nicotine dependence, cigarettes, uncomplicated: Secondary | ICD-10-CM

## 2019-07-08 DIAGNOSIS — Z87891 Personal history of nicotine dependence: Secondary | ICD-10-CM

## 2019-07-15 DIAGNOSIS — M25561 Pain in right knee: Secondary | ICD-10-CM | POA: Diagnosis not present

## 2019-07-27 DIAGNOSIS — M25561 Pain in right knee: Secondary | ICD-10-CM | POA: Diagnosis not present

## 2019-08-01 ENCOUNTER — Ambulatory Visit (INDEPENDENT_AMBULATORY_CARE_PROVIDER_SITE_OTHER): Payer: Medicare Other | Admitting: Acute Care

## 2019-08-01 ENCOUNTER — Ambulatory Visit
Admission: RE | Admit: 2019-08-01 | Discharge: 2019-08-01 | Disposition: A | Discharge status: Home / Self Care | Payer: Medicare Other | Source: Ambulatory Visit | Attending: Acute Care | Admitting: Acute Care

## 2019-08-01 ENCOUNTER — Encounter: Payer: Self-pay | Admitting: Acute Care

## 2019-08-01 ENCOUNTER — Other Ambulatory Visit: Payer: Self-pay

## 2019-08-01 DIAGNOSIS — F17210 Nicotine dependence, cigarettes, uncomplicated: Secondary | ICD-10-CM

## 2019-08-01 DIAGNOSIS — Z87891 Personal history of nicotine dependence: Secondary | ICD-10-CM

## 2019-08-01 DIAGNOSIS — Z122 Encounter for screening for malignant neoplasm of respiratory organs: Secondary | ICD-10-CM

## 2019-08-01 NOTE — Assessment & Plan Note (Addendum)
Baseline Lung Cancer Screening through screening program 08/01/2019

## 2019-08-01 NOTE — Patient Instructions (Addendum)
Thank you for participating in the [facility]. It was our pleasure to meet you today. We will call you with the results of your scan within the next few days. Your scan will be assigned a Lung RADS category score by the physicians reading the scans.  This Lung RADS score determines follow up scanning.  See below for description of categories, and follow up screening recommendations. We will be in touch to schedule your follow up screening annually or based on recommendations of our providers. We will fax a copy of your scan results to your Primary Care Physician, or the physician who referred you to the program, to ensure they have the results. Please call the office if you have any questions or concerns regarding your scanning experience or results.  Our office number is [phone]. Please speak with Doroteo Glassman, RN. She is our Lung Cancer Lobbyist. If she is unavailable when you call, please have the office staff send her a message. She will return your call at her earliest convenience. Remember, if your scan is normal, we will scan you annually as long as you continue to meet the criteria for the program. (Age 41-77, Current smoker or smoker who has quit within the last 15 years). If you are a smoker, remember, quitting is the single most powerful action that you can take to decrease your risk of lung cancer and other pulmonary, breathing related problems. We know quitting is hard, and we are here to help.  Please let us know if there is anything we can do to help you meet your goal of quitting. If you are a former smoker, Medical sales representative. We are proud of you! Remain smoke free! Remember you can refer friends or family members through the number above.  We will screen them to make sure they meet criteria for the program. Thank you for helping [place] take better care of you by participating in Lung Screening.  Lung RADS Categories:  Lung RADS 1: no nodules  or definitely non-concerning nodules.  Recommendation is for a repeat annual scan in 12 months.  Lung RADS 2:  nodules that are non-concerning in appearance and behavior with a very low likelihood of becoming an active cancer. Recommendation is for a repeat annual scan in 12 months.  Lung RADS 3: nodules that are probably non-concerning , includes nodules with a low likelihood of becoming an active cancer.  Recommendation is for a 85-month repeat screening scan. Often noted after an upper respiratory illness. We will be in touch to make sure you have no questions, and to schedule your 68-month scan.  Lung RADS 4 A: nodules with concerning findings, recommendation is most often for a follow up scan in 3 months or additional testing based on our provider's assessment of the scan. We will be in touch to make sure you have no questions and to schedule the recommended 3 month follow up scan.  Lung RADS 4 B:  indicates findings that are concerning. We will be in touch with you to schedule additional diagnostic testing based on our provider's  assessment of the scan.  ** Please schedule patient for smoking cessation visit with the pharmacy. Thanks

## 2019-08-01 NOTE — Progress Notes (Signed)
Shared Decision Making Visit Lung Cancer Screening Program [phone])   Eligibility:  Age 69 y.o.  Pack Years Smoking History Calculation 53 pack years (# packs/per year x # years smoked)  Recent History of coughing up blood  no  Unexplained weight loss? no ( >Than 15 pounds within the last 6 months )  Prior History Lung / other cancer no (Diagnosis within the last 5 years already requiring surveillance chest CT Scans).  Smoking Status Current Smoker Former Smokers: Years since quit: NA  Quit Date: NA  Visit Components:  Discussion included one or more decision making aids. yes  Discussion included risk/benefits of screening. yes  Discussion included potential follow up diagnostic testing for abnormal scans. yes  Discussion included meaning and risk of over diagnosis. yes  Discussion included meaning and risk of False Positives. yes  Discussion included meaning of total radiation exposure. yes  Counseling Included:  Importance of adherence to annual lung cancer LDCT screening. yes  Impact of comorbidities on ability to participate in the program. yes  Ability and willingness to under diagnostic treatment. yes  Smoking Cessation Counseling:  Current Smokers:   Discussed importance of smoking cessation. yes  Information about tobacco cessation classes and interventions provided to patient. yes  Patient provided with "ticket" for LDCT Scan. yes  Symptomatic Patient. No  Counseling NA  Diagnosis Code: Tobacco Use Z72.0  Asymptomatic Patient yes  Counseling (Intermediate counseling: > three minutes counseling) [id number]  Former Smokers:   Discussed the importance of maintaining cigarette abstinence. yes  Diagnosis Code: Personal History of Nicotine Dependence. [id number]  Information about tobacco cessation classes and interventions provided to patient. Yes  Patient provided with "ticket" for LDCT Scan. yes  Written Order for Lung Cancer Screening with LDCT  placed in Epic. Yes (CT Chest Lung Cancer Screening Low Dose W/O CM) [id number] Z12.2-Screening of respiratory organs Z87.891-Personal history of nicotine dependence  I have spent 25 minutes of face to face time with Steven Lawrence discussing the risks and benefits of lung cancer screening. We viewed a power point together that explained in detail the above noted topics. We paused at intervals to allow for questions to be asked and answered to ensure understanding.We discussed that the single most powerful action that he can take to decrease his risk of developing lung cancer is to quit smoking. We discussed whether or not he is ready to commit to setting a quit date. We discussed options for tools to aid in quitting smoking including nicotine replacement therapy, non-nicotine medications, support groups, Quit Smart classes, and behavior modification. We discussed that often times setting smaller, more achievable goals, such as eliminating 1 cigarette a day for a week and then 2 cigarettes a day for a week can be helpful in slowly decreasing the number of cigarettes smoked. This allows for a sense of accomplishment as well as providing a clinical benefit. I gave him the " Be Stronger Than Your Excuses" card with contact information for community resources, classes, free nicotine replacement therapy, and access to mobile apps, text messaging, and on-line smoking cessation help. I have also given him my card and contact information in the event he needs to contact me. We discussed the time and location of the scan, and that either Doroteo Glassman RN or I will call with the results within 24-48 hours of receiving them. I have offered him  a copy of the power point we viewed  as a resource in the event they need reinforcement of the concepts  we discussed today in the office. The patient verbalized understanding of all of  the above and had no further questions upon leaving the office. They have my contact information in the  event they have any further questions.  I spent 3 minutes counseling on smoking cessation and the health risks of continued tobacco abuse.  I explained to the patient that there has been a high incidence of coronary artery disease noted on these exams. I explained that this is a non-gated exam therefore degree or severity cannot be determined. This patient is currently on statin therapy. I have asked the patient to follow-up with their PCP regarding any incidental finding of coronary artery disease and management with diet or medication as their PCP  feels is clinically indicated. The patient verbalized understanding of the above and had no further questions upon completion of the visit.   Pt. Wants to meet with a pharmacist to learn options for smoking cessation. He has tried patches recently, but this was not an effective option for him.    Magdalen Spatz, NP 08/01/2019

## 2019-08-04 ENCOUNTER — Other Ambulatory Visit: Payer: Self-pay | Admitting: *Deleted

## 2019-08-04 DIAGNOSIS — Z87891 Personal history of nicotine dependence: Secondary | ICD-10-CM

## 2019-08-04 DIAGNOSIS — F17210 Nicotine dependence, cigarettes, uncomplicated: Secondary | ICD-10-CM

## 2019-08-04 NOTE — Progress Notes (Signed)
Please call patient and let them  know their  low dose Ct was read as a Lung RADS 2: nodules that are benign in appearance and behavior with a very low likelihood of becoming a clinically active cancer due to size or lack of growth. Recommendation per radiology is for a repeat LDCT in 12 months. .Please let them  know we will order and schedule their  annual screening scan for 07/2020. Please let them  know there was notation of CAD on their  scan.  Please remind the patient  that this is a non-gated exam therefore degree or severity of disease  cannot be determined. Please have them  follow up with their PCP regarding potential risk factor modification, dietary therapy or pharmacologic therapy if clinically indicated. Pt.  is  currently on statin therapy. Please place order for annual  screening scan for  07/2020 and fax results to PCP. Thanks so much. 

## 2019-08-12 ENCOUNTER — Telehealth: Payer: Self-pay | Admitting: Pharmacist

## 2019-08-12 NOTE — Telephone Encounter (Signed)
Patient scheduled with the pharmacy team on 6/3 but I will not be in the office.  Called to reschedule.  Spoke with patient's wife.  She will have patient call the office to reschedule.   Mariella Saa, PharmD, [place], CPP Clinical Specialty Pharmacist (Rheumatology and Pulmonology)  08/12/2019 1:05 PM

## 2019-08-18 ENCOUNTER — Other Ambulatory Visit: Payer: Medicare Other

## 2019-08-18 DIAGNOSIS — M25561 Pain in right knee: Secondary | ICD-10-CM | POA: Diagnosis not present

## 2019-09-01 DIAGNOSIS — M25562 Pain in left knee: Secondary | ICD-10-CM | POA: Diagnosis not present

## 2019-09-01 DIAGNOSIS — M25561 Pain in right knee: Secondary | ICD-10-CM | POA: Diagnosis not present

## 2019-10-05 DIAGNOSIS — E7800 Pure hypercholesterolemia, unspecified: Secondary | ICD-10-CM | POA: Diagnosis not present

## 2019-10-05 DIAGNOSIS — E039 Hypothyroidism, unspecified: Secondary | ICD-10-CM | POA: Diagnosis not present

## 2019-12-12 DIAGNOSIS — B369 Superficial mycosis, unspecified: Secondary | ICD-10-CM | POA: Diagnosis not present

## 2019-12-12 DIAGNOSIS — H6240 Otitis externa in other diseases classified elsewhere, unspecified ear: Secondary | ICD-10-CM | POA: Diagnosis not present

## 2019-12-12 DIAGNOSIS — Z23 Encounter for immunization: Secondary | ICD-10-CM | POA: Diagnosis not present

## 2019-12-12 DIAGNOSIS — H608X1 Other otitis externa, right ear: Secondary | ICD-10-CM | POA: Diagnosis not present

## 2019-12-12 DIAGNOSIS — H6122 Impacted cerumen, left ear: Secondary | ICD-10-CM | POA: Diagnosis not present

## 2019-12-16 ENCOUNTER — Encounter (HOSPITAL_COMMUNITY): Payer: Self-pay | Admitting: Emergency Medicine

## 2019-12-16 ENCOUNTER — Other Ambulatory Visit: Payer: Self-pay

## 2019-12-16 ENCOUNTER — Emergency Department (HOSPITAL_COMMUNITY): Payer: Medicare Other

## 2019-12-16 ENCOUNTER — Inpatient Hospital Stay (HOSPITAL_COMMUNITY)
Admission: EM | Admit: 2019-12-16 | Discharge: 2019-12-26 | DRG: 217 | Disposition: A | Discharge status: Home / Self Care | Payer: Medicare Other | Attending: Thoracic Surgery (Cardiothoracic Vascular Surgery) | Admitting: Thoracic Surgery (Cardiothoracic Vascular Surgery)

## 2019-12-16 DIAGNOSIS — Z8249 Family history of ischemic heart disease and other diseases of the circulatory system: Secondary | ICD-10-CM

## 2019-12-16 DIAGNOSIS — E1165 Type 2 diabetes mellitus with hyperglycemia: Secondary | ICD-10-CM | POA: Diagnosis not present

## 2019-12-16 DIAGNOSIS — E7800 Pure hypercholesterolemia, unspecified: Secondary | ICD-10-CM | POA: Diagnosis present

## 2019-12-16 DIAGNOSIS — Z794 Long term (current) use of insulin: Secondary | ICD-10-CM

## 2019-12-16 DIAGNOSIS — Z952 Presence of prosthetic heart valve: Secondary | ICD-10-CM

## 2019-12-16 DIAGNOSIS — Z9889 Other specified postprocedural states: Secondary | ICD-10-CM

## 2019-12-16 DIAGNOSIS — I350 Nonrheumatic aortic (valve) stenosis: Secondary | ICD-10-CM | POA: Diagnosis present

## 2019-12-16 DIAGNOSIS — J9811 Atelectasis: Secondary | ICD-10-CM | POA: Diagnosis not present

## 2019-12-16 DIAGNOSIS — Z7984 Long term (current) use of oral hypoglycemic drugs: Secondary | ICD-10-CM

## 2019-12-16 DIAGNOSIS — I10 Essential (primary) hypertension: Secondary | ICD-10-CM | POA: Diagnosis present

## 2019-12-16 DIAGNOSIS — I200 Unstable angina: Secondary | ICD-10-CM

## 2019-12-16 DIAGNOSIS — R079 Chest pain, unspecified: Secondary | ICD-10-CM

## 2019-12-16 DIAGNOSIS — Z951 Presence of aortocoronary bypass graft: Secondary | ICD-10-CM

## 2019-12-16 DIAGNOSIS — F32A Depression, unspecified: Secondary | ICD-10-CM | POA: Diagnosis present

## 2019-12-16 DIAGNOSIS — E119 Type 2 diabetes mellitus without complications: Secondary | ICD-10-CM

## 2019-12-16 DIAGNOSIS — Z833 Family history of diabetes mellitus: Secondary | ICD-10-CM

## 2019-12-16 DIAGNOSIS — E039 Hypothyroidism, unspecified: Secondary | ICD-10-CM | POA: Diagnosis present

## 2019-12-16 DIAGNOSIS — Z79890 Hormone replacement therapy (postmenopausal): Secondary | ICD-10-CM

## 2019-12-16 DIAGNOSIS — I2584 Coronary atherosclerosis due to calcified coronary lesion: Secondary | ICD-10-CM | POA: Diagnosis present

## 2019-12-16 DIAGNOSIS — R0789 Other chest pain: Secondary | ICD-10-CM | POA: Diagnosis not present

## 2019-12-16 DIAGNOSIS — Z809 Family history of malignant neoplasm, unspecified: Secondary | ICD-10-CM

## 2019-12-16 DIAGNOSIS — E8770 Fluid overload, unspecified: Secondary | ICD-10-CM | POA: Diagnosis not present

## 2019-12-16 DIAGNOSIS — Z7982 Long term (current) use of aspirin: Secondary | ICD-10-CM

## 2019-12-16 DIAGNOSIS — I25110 Atherosclerotic heart disease of native coronary artery with unstable angina pectoris: Secondary | ICD-10-CM | POA: Diagnosis present

## 2019-12-16 DIAGNOSIS — Z820 Family history of epilepsy and other diseases of the nervous system: Secondary | ICD-10-CM

## 2019-12-16 DIAGNOSIS — Z79899 Other long term (current) drug therapy: Secondary | ICD-10-CM

## 2019-12-16 DIAGNOSIS — D62 Acute posthemorrhagic anemia: Secondary | ICD-10-CM | POA: Diagnosis not present

## 2019-12-16 DIAGNOSIS — E785 Hyperlipidemia, unspecified: Secondary | ICD-10-CM | POA: Diagnosis present

## 2019-12-16 DIAGNOSIS — N400 Enlarged prostate without lower urinary tract symptoms: Secondary | ICD-10-CM | POA: Diagnosis present

## 2019-12-16 DIAGNOSIS — G4733 Obstructive sleep apnea (adult) (pediatric): Secondary | ICD-10-CM | POA: Diagnosis present

## 2019-12-16 DIAGNOSIS — Z20822 Contact with and (suspected) exposure to covid-19: Secondary | ICD-10-CM | POA: Diagnosis present

## 2019-12-16 DIAGNOSIS — Z981 Arthrodesis status: Secondary | ICD-10-CM

## 2019-12-16 DIAGNOSIS — G2581 Restless legs syndrome: Secondary | ICD-10-CM | POA: Diagnosis present

## 2019-12-16 DIAGNOSIS — Z09 Encounter for follow-up examination after completed treatment for conditions other than malignant neoplasm: Secondary | ICD-10-CM

## 2019-12-16 DIAGNOSIS — K219 Gastro-esophageal reflux disease without esophagitis: Secondary | ICD-10-CM | POA: Diagnosis present

## 2019-12-16 DIAGNOSIS — I480 Paroxysmal atrial fibrillation: Secondary | ICD-10-CM | POA: Diagnosis not present

## 2019-12-16 DIAGNOSIS — Z974 Presence of external hearing-aid: Secondary | ICD-10-CM

## 2019-12-16 DIAGNOSIS — I214 Non-ST elevation (NSTEMI) myocardial infarction: Principal | ICD-10-CM | POA: Diagnosis present

## 2019-12-16 DIAGNOSIS — M1990 Unspecified osteoarthritis, unspecified site: Secondary | ICD-10-CM | POA: Diagnosis present

## 2019-12-16 DIAGNOSIS — F17210 Nicotine dependence, cigarettes, uncomplicated: Secondary | ICD-10-CM | POA: Diagnosis present

## 2019-12-16 DIAGNOSIS — D696 Thrombocytopenia, unspecified: Secondary | ICD-10-CM | POA: Diagnosis not present

## 2019-12-16 DIAGNOSIS — R52 Pain, unspecified: Secondary | ICD-10-CM | POA: Diagnosis not present

## 2019-12-16 DIAGNOSIS — Z716 Tobacco abuse counseling: Secondary | ICD-10-CM

## 2019-12-16 LAB — CBC WITH DIFFERENTIAL/PLATELET
Abs Immature Granulocytes: 0.03 10*3/uL (ref 0.00–0.07)
Basophils Absolute: 0.1 10*3/uL (ref 0.0–0.1)
Basophils Relative: 1 %
Eosinophils Absolute: 0.3 10*3/uL (ref 0.0–0.5)
Eosinophils Relative: 5 %
HCT: 41.2 % (ref 39.0–52.0)
Hemoglobin: 13.6 g/dL (ref 13.0–17.0)
Immature Granulocytes: 0 %
Lymphocytes Relative: 33 %
Lymphs Abs: 2.3 10*3/uL (ref 0.7–4.0)
MCH: 31.7 pg (ref 26.0–34.0)
MCHC: 33 g/dL (ref 30.0–36.0)
MCV: 96 fL (ref 80.0–100.0)
Monocytes Absolute: 0.6 10*3/uL (ref 0.1–1.0)
Monocytes Relative: 8 %
Neutro Abs: 3.7 10*3/uL (ref 1.7–7.7)
Neutrophils Relative %: 53 %
Platelets: 229 10*3/uL (ref 150–400)
RBC: 4.29 MIL/uL (ref 4.22–5.81)
RDW: 13.9 % (ref 11.5–15.5)
WBC: 7 10*3/uL (ref 4.0–10.5)
nRBC: 0 % (ref 0.0–0.2)

## 2019-12-16 LAB — COMPREHENSIVE METABOLIC PANEL
ALT: 19 U/L (ref 0–44)
AST: 17 U/L (ref 15–41)
Albumin: 3.7 g/dL (ref 3.5–5.0)
Alkaline Phosphatase: 51 U/L (ref 38–126)
Anion gap: 12 (ref 5–15)
BUN: 20 mg/dL (ref 8–23)
CO2: 22 mmol/L (ref 22–32)
Calcium: 9.1 mg/dL (ref 8.9–10.3)
Chloride: 104 mmol/L (ref 98–111)
Creatinine, Ser: 1.02 mg/dL (ref 0.61–1.24)
GFR calc Af Amer: >60 mL/min (ref >60)
GFR calc non Af Amer: >60 mL/min (ref >60)
Glucose, Bld: 265 mg/dL — ABNORMAL HIGH (ref 70–99)
Potassium: 4 mmol/L (ref 3.5–5.1)
Sodium: 138 mmol/L (ref 135–145)
Total Bilirubin: 0.8 mg/dL (ref 0.3–1.2)
Total Protein: 5.9 g/dL — ABNORMAL LOW (ref 6.5–8.1)

## 2019-12-16 LAB — PROTIME-INR
INR: 1 (ref 0.8–1.2)
Prothrombin Time: 12.8 seconds (ref 11.4–15.2)

## 2019-12-16 LAB — LIPASE, BLOOD: Lipase: 48 U/L (ref 11–51)

## 2019-12-16 LAB — TROPONIN I (HIGH SENSITIVITY): Troponin I (High Sensitivity): 13 ng/L (ref <18)

## 2019-12-16 NOTE — ED Notes (Signed)
XR at bedside

## 2019-12-16 NOTE — ED Provider Notes (Signed)
[facility] EMERGENCY DEPARTMENT Provider Note   CSN: [id number] Arrival date & time: 12/16/19  2024     History Chief Complaint  Patient presents with  . Chest Pain    Steven Lawrence is a 69 y.o. male.  HPI    Pt is a 69 y/o male - hx of 50 pack year tob use - actively smoking 1 ppd - has DM and htn, and hypercholesterolemia.  Has never had heart disease - had a stress many years ago which he can't recall timing - states it was negative - was mowing the yard this afternoon and felt pain that was midsternal described as a heavy pressure feeling, there is no radiation, he states he did have some shortness of breath with it, it lasted for somewhere around 30 minutes and then resolved.  He denies any swelling of his legs, there is no coughing or fever, no nausea vomiting or diarrhea, no diaphoresis.  This evening a short time prior to calling for EMS the patient had recurrent symptoms in the exact same place lasting about 30 minutes, seem to improve with 2 nitroglycerin and at this time has completely resolved.  The patient has not had exertional symptoms in the past, he recalls walking several miles at a time for occasional exercise, he has never had exertional chest discomfort in the past.  He was given 324 mg of aspirin and 2 nitroglycerin prior to arrival  Prehospital EKG reviewed, subtle findings of ST abnormalities seen.   Past Medical History:  Diagnosis Date  . Acid reflux   . Arthritis   . BPH (benign prostatic hyperplasia)    mild  . Depression   . Diabetes mellitus without complication ([place])    type II  . Heart murmur    with AVSC echo 2009  . History of pneumonia as a child   . Hypercholesteremia   . Hypothyroidism   . RLS (restless legs syndrome)   . Sleep apnea    does not use a cpap,primarily with upper airway resistance syndrome AHI 2.35/hr, RDI 10.2/hr ([name])  . Urinary frequency   . Wears glasses   . Wears hearing aid    both ears     Patient Active Problem List   Diagnosis Date Noted  . Chest pain 12/16/2019  . Encounter for screening for lung cancer 08/01/2019  . Radiculopathy 01/04/2015  . Obstructive sleep apnea of adult 12/16/2012  . DM (diabetes mellitus) ([place]) 12/16/2012  . Restless legs syndrome (RLS) 12/16/2012  . Depression 12/16/2012  . BPH (benign prostatic hyperplasia) 12/16/2012  . Right inguinal hernia 07/30/2012    Past Surgical History:  Procedure Laterality Date  . ANTERIOR LAT LUMBAR FUSION Left 01/04/2015   Procedure: ANTERIOR LATERAL LUMBAR FUSION 1 LEVEL;  Surgeon: Phylliss Bob, MD;  Location: [place];  Service: Orthopedics;  Laterality: Left;  Left sided lateral interbody fusion lumbar 3-4 with instrumentation and allograft  . COLONOSCOPY    . ESOPHAGOGASTRODUODENOSCOPY    . INGUINAL HERNIA REPAIR Right 08/12/2012   Procedure: HERNIA REPAIR INGUINAL ADULT;  Surgeon: Imogene Burn. Georgette Dover, MD;  Location: [place];  Service: General;  Laterality: Right;  . INSERTION OF MESH Right 08/12/2012   Procedure: INSERTION OF MESH;  Surgeon: Imogene Burn. Georgette Dover, MD;  Location: [place];  Service: General;  Laterality: Right;  . TONSILLECTOMY    . VASECTOMY  1985       Family History  Problem Relation Age of Onset  .  Diabetes Mother   . Heart disease Mother   . Parkinson's disease Mother   . Heart disease Father   . Cancer Father        melonma/Lung  . Diabetes Sister   . Diabetes Brother   . Heart disease Brother   . Multiple sclerosis Daughter     Social History   Tobacco Use  . Smoking status: Current Every Day Smoker    Packs/day: 1.00    Years: 53.00    Pack years: 53.00  . Smokeless tobacco: Never Used  Substance Use Topics  . Alcohol use: Yes    Comment: occ  . Drug use: No    Home Medications Prior to Admission medications   Medication Sig Start Date End Date Taking? Authorizing Provider  aspirin 81 MG tablet Take 81 mg by mouth daily.   Yes  [provider]  atorvastatin (LIPITOR) 20 MG tablet Take 20 mg by mouth daily.  07/23/12  Yes [provider]  canagliflozin (INVOKANA) 100 MG TABS tablet Take 100 mg by mouth daily.   Yes [provider]  doxazosin (CARDURA) 2 MG tablet Take 2 mg by mouth every morning. Takes for urinary freq 07/27/12  Yes [provider]  LEVEMIR FLEXTOUCH 100 UNIT/ML FlexPen Inject 20 Units into the skin at bedtime. 12/02/19  Yes [provider]  levothyroxine (SYNTHROID, LEVOTHROID) 50 MCG tablet Take 50 mcg by mouth daily before breakfast.   Yes [provider]  lisinopril (PRINIVIL,ZESTRIL) 10 MG tablet Take 10 mg by mouth daily.   Yes [provider]  metFORMIN (GLUCOPHAGE) 1000 MG tablet Take 1,000 mg by mouth 2 (two) times daily with a meal.   Yes [provider]  ONGLYZA 5 MG TABS tablet Take 5 mg by mouth.  07/04/12  Yes [provider]  rOPINIRole (REQUIP) 1 MG tablet Take 2 mg by mouth at bedtime.   Yes [provider]    Allergies    Patient has no known allergies.  Review of Systems   Review of Systems  All other systems reviewed and are negative.   Physical Exam Updated Vital Signs BP 112/71   Pulse 65   Temp 98.3 F (36.8 C) (Oral)   Resp 15   Ht 1.753 m (5\' 9" )   Wt 68 kg   SpO2 97%   BMI 22.15 kg/m   Physical Exam Vitals and nursing note reviewed.  Constitutional:      General: He is not in acute distress.    Appearance: He is well-developed.  HENT:     Head: Normocephalic and atraumatic.     Mouth/Throat:     Pharynx: No oropharyngeal exudate.  Eyes:     General: No scleral icterus.       Right eye: No discharge.        Left eye: No discharge.     Conjunctiva/sclera: Conjunctivae normal.     Pupils: Pupils are equal, round, and reactive to light.  Neck:     Thyroid: No thyromegaly.     Vascular: No JVD.  Cardiovascular:     Rate and Rhythm: Normal rate and regular rhythm.      Heart sounds: Murmur heard.  No friction rub. No gallop.      Comments: Soft systolic murmur Pulmonary:     Effort: Pulmonary effort is normal. No respiratory distress.     Breath sounds: Normal breath sounds. No wheezing or rales.  Abdominal:     General: Bowel sounds  are normal. There is no distension.     Palpations: Abdomen is soft. There is no mass.     Tenderness: There is no abdominal tenderness.  Musculoskeletal:        General: No tenderness. Normal range of motion.     Cervical back: Normal range of motion and neck supple.  Lymphadenopathy:     Cervical: No cervical adenopathy.  Skin:    General: Skin is warm and dry.     Findings: No erythema or rash.  Neurological:     Mental Status: He is alert.     Coordination: Coordination normal.  Psychiatric:        Behavior: Behavior normal.     ED Results / Procedures / Treatments   Labs (all labs ordered are listed, but only abnormal results are displayed) Labs Reviewed  COMPREHENSIVE METABOLIC PANEL - Abnormal; Notable for the following components:      Result Value   Glucose, Bld 265 (*)    Total Protein 5.9 (*)    All other components within normal limits  PROTIME-INR  CBC WITH DIFFERENTIAL/PLATELET  LIPASE, BLOOD  TROPONIN I (HIGH SENSITIVITY)    EKG EKG Interpretation  Date/Time:  Friday December 16 2019 20:42:20 EDT Ventricular Rate:  71 PR Interval:    QRS Duration: 85 QT Interval:  392 QTC Calculation: 426 R Axis:   35 Text Interpretation: Sinus rhythm Borderline ST elevation, anterior leads since last tracing no significant change St depression in limb leads 2 and 3, St abnormalities in precordium similar to prior Confirmed by Noemi Chapel [phone]) on 12/16/2019 8:48:35 PM   Radiology DG Chest Port 1 View  Result Date: 12/16/2019 CLINICAL DATA:  Chest pain EXAM: PORTABLE CHEST 1 VIEW.  Patient is rotated. COMPARISON:  Comparison chest x-ray 12/27/2014, CT chest 08/01/2019 FINDINGS: The heart size  and mediastinal contours are within normal limits. Aortic arch calcifications. Left base linear atelectasis with elevation of the left hemidiaphragm. Redemonstration of a subcentimeter calcified lesion at the left base likely representing a granuloma. Similar finding within the right upper lobe. No focal consolidation. No pulmonary edema. No pleural effusion. No pneumothorax. No acute osseous abnormality. IMPRESSION: No active cardiopulmonary disease. Electronically Signed   By: Iven Finn M.D.   On: 12/16/2019 21:20    Procedures Procedures (including critical care time)  Medications Ordered in ED Medications - No data to display  ED Course  I have reviewed the triage vital signs and the nursing notes.  Pertinent labs & imaging results that were available during my care of the patient were reviewed by me and considered in my medical decision making (see chart for details).    MDM Rules/Calculators/A&P                          This patient is high risk for cardiac disease, he has multiple risk factors including diabetes hypertension and high cholesterol as well as a very heavy tobacco use history.  He has an EKG with some subtle ST abnormalities and is at high risk for coronary disease and an acute major coronary event.  I will discuss his case with cardiology, he will likely need to be admitted to the hospital for further evaluation and possible provocative testing.  Will hold on heparin unless he gets active pain / elevated trop  Troponin came back at a nonelevated level, discussed the care with the on-call cardiologist who agrees patient can be admitted to medical service with  a stress test unless symptoms get worse or develops EKG findings of STEMI.  Hospitalist paged at 9:50 PM  D/w Dr. Hal Hope - will admit the patient to the hospital.  Final Clinical Impression(s) / ED Diagnoses Final diagnoses:  Unstable angina (HCC)      Noemi Chapel, MD 12/16/19 2201

## 2019-12-16 NOTE — ED Triage Notes (Signed)
Pt BIB [facility] c/o CP. Onset earlier today while mowing lawn, subsided with rest. Second episode occurred while at rest rating 7 out of 10. Given 324 ASA x2 NTG. On Arrival denies CP. HX DM, smoking, with no cardiac history.

## 2019-12-17 ENCOUNTER — Other Ambulatory Visit (HOSPITAL_COMMUNITY): Payer: Medicare Other

## 2019-12-17 ENCOUNTER — Observation Stay (HOSPITAL_COMMUNITY): Payer: Medicare Other

## 2019-12-17 ENCOUNTER — Encounter (HOSPITAL_COMMUNITY): Payer: Self-pay | Admitting: Internal Medicine

## 2019-12-17 DIAGNOSIS — R918 Other nonspecific abnormal finding of lung field: Secondary | ICD-10-CM | POA: Diagnosis not present

## 2019-12-17 DIAGNOSIS — I200 Unstable angina: Secondary | ICD-10-CM | POA: Diagnosis not present

## 2019-12-17 DIAGNOSIS — I10 Essential (primary) hypertension: Secondary | ICD-10-CM | POA: Diagnosis present

## 2019-12-17 DIAGNOSIS — Z974 Presence of external hearing-aid: Secondary | ICD-10-CM | POA: Diagnosis not present

## 2019-12-17 DIAGNOSIS — I350 Nonrheumatic aortic (valve) stenosis: Secondary | ICD-10-CM

## 2019-12-17 DIAGNOSIS — F17210 Nicotine dependence, cigarettes, uncomplicated: Secondary | ICD-10-CM | POA: Diagnosis present

## 2019-12-17 DIAGNOSIS — N400 Enlarged prostate without lower urinary tract symptoms: Secondary | ICD-10-CM | POA: Diagnosis present

## 2019-12-17 DIAGNOSIS — E118 Type 2 diabetes mellitus with unspecified complications: Secondary | ICD-10-CM | POA: Diagnosis not present

## 2019-12-17 DIAGNOSIS — R079 Chest pain, unspecified: Secondary | ICD-10-CM

## 2019-12-17 DIAGNOSIS — E785 Hyperlipidemia, unspecified: Secondary | ICD-10-CM

## 2019-12-17 DIAGNOSIS — I2510 Atherosclerotic heart disease of native coronary artery without angina pectoris: Secondary | ICD-10-CM | POA: Diagnosis not present

## 2019-12-17 DIAGNOSIS — J9811 Atelectasis: Secondary | ICD-10-CM | POA: Diagnosis not present

## 2019-12-17 DIAGNOSIS — I25110 Atherosclerotic heart disease of native coronary artery with unstable angina pectoris: Secondary | ICD-10-CM | POA: Diagnosis present

## 2019-12-17 DIAGNOSIS — Z01810 Encounter for preprocedural cardiovascular examination: Secondary | ICD-10-CM | POA: Diagnosis not present

## 2019-12-17 DIAGNOSIS — Z720 Tobacco use: Secondary | ICD-10-CM

## 2019-12-17 DIAGNOSIS — I214 Non-ST elevation (NSTEMI) myocardial infarction: Secondary | ICD-10-CM | POA: Diagnosis present

## 2019-12-17 DIAGNOSIS — E8770 Fluid overload, unspecified: Secondary | ICD-10-CM | POA: Diagnosis not present

## 2019-12-17 DIAGNOSIS — M1990 Unspecified osteoarthritis, unspecified site: Secondary | ICD-10-CM | POA: Diagnosis present

## 2019-12-17 DIAGNOSIS — D696 Thrombocytopenia, unspecified: Secondary | ICD-10-CM | POA: Diagnosis not present

## 2019-12-17 DIAGNOSIS — I480 Paroxysmal atrial fibrillation: Secondary | ICD-10-CM | POA: Diagnosis not present

## 2019-12-17 DIAGNOSIS — J90 Pleural effusion, not elsewhere classified: Secondary | ICD-10-CM | POA: Diagnosis not present

## 2019-12-17 DIAGNOSIS — F32A Depression, unspecified: Secondary | ICD-10-CM | POA: Diagnosis present

## 2019-12-17 DIAGNOSIS — Z833 Family history of diabetes mellitus: Secondary | ICD-10-CM | POA: Diagnosis not present

## 2019-12-17 DIAGNOSIS — D62 Acute posthemorrhagic anemia: Secondary | ICD-10-CM | POA: Diagnosis not present

## 2019-12-17 DIAGNOSIS — G4733 Obstructive sleep apnea (adult) (pediatric): Secondary | ICD-10-CM | POA: Diagnosis present

## 2019-12-17 DIAGNOSIS — E1165 Type 2 diabetes mellitus with hyperglycemia: Secondary | ICD-10-CM | POA: Diagnosis not present

## 2019-12-17 DIAGNOSIS — E1169 Type 2 diabetes mellitus with other specified complication: Secondary | ICD-10-CM | POA: Diagnosis not present

## 2019-12-17 DIAGNOSIS — Z981 Arthrodesis status: Secondary | ICD-10-CM | POA: Diagnosis not present

## 2019-12-17 DIAGNOSIS — E039 Hypothyroidism, unspecified: Secondary | ICD-10-CM | POA: Diagnosis present

## 2019-12-17 DIAGNOSIS — Z952 Presence of prosthetic heart valve: Secondary | ICD-10-CM | POA: Diagnosis not present

## 2019-12-17 DIAGNOSIS — E7800 Pure hypercholesterolemia, unspecified: Secondary | ICD-10-CM | POA: Diagnosis present

## 2019-12-17 DIAGNOSIS — I25118 Atherosclerotic heart disease of native coronary artery with other forms of angina pectoris: Secondary | ICD-10-CM | POA: Diagnosis not present

## 2019-12-17 DIAGNOSIS — Z9889 Other specified postprocedural states: Secondary | ICD-10-CM | POA: Diagnosis not present

## 2019-12-17 DIAGNOSIS — Z20822 Contact with and (suspected) exposure to covid-19: Secondary | ICD-10-CM | POA: Diagnosis present

## 2019-12-17 DIAGNOSIS — K219 Gastro-esophageal reflux disease without esophagitis: Secondary | ICD-10-CM | POA: Diagnosis present

## 2019-12-17 DIAGNOSIS — E119 Type 2 diabetes mellitus without complications: Secondary | ICD-10-CM | POA: Diagnosis present

## 2019-12-17 DIAGNOSIS — G2581 Restless legs syndrome: Secondary | ICD-10-CM | POA: Diagnosis present

## 2019-12-17 DIAGNOSIS — I358 Other nonrheumatic aortic valve disorders: Secondary | ICD-10-CM | POA: Diagnosis not present

## 2019-12-17 LAB — CBC
HCT: 40.6 % (ref 39.0–52.0)
HCT: 40.1 % (ref 39.0–52.0)
Hemoglobin: 13.5 g/dL (ref 13.0–17.0)
Hemoglobin: 13 g/dL (ref 13.0–17.0)
MCH: 32.5 pg (ref 26.0–34.0)
MCH: 31.5 pg (ref 26.0–34.0)
MCHC: 33.3 g/dL (ref 30.0–36.0)
MCHC: 32.4 g/dL (ref 30.0–36.0)
MCV: 97.6 fL (ref 80.0–100.0)
MCV: 97.1 fL (ref 80.0–100.0)
Platelets: 217 10*3/uL (ref 150–400)
Platelets: 222 10*3/uL (ref 150–400)
RBC: 4.16 MIL/uL — ABNORMAL LOW (ref 4.22–5.81)
RBC: 4.13 MIL/uL — ABNORMAL LOW (ref 4.22–5.81)
RDW: 13.9 % (ref 11.5–15.5)
RDW: 14 % (ref 11.5–15.5)
WBC: 6.7 10*3/uL (ref 4.0–10.5)
WBC: 6.9 10*3/uL (ref 4.0–10.5)
nRBC: 0 % (ref 0.0–0.2)
nRBC: 0 % (ref 0.0–0.2)

## 2019-12-17 LAB — ECHOCARDIOGRAM COMPLETE
AR max vel: 0.98 cm2
AV Area VTI: 1.11 cm2
AV Area mean vel: 0.94 cm2
AV Mean grad: 22.1 mmHg
AV Peak grad: 37.3 mmHg
Ao pk vel: 3.05 m/s
Area-P 1/2: 2.99 cm2
Height: 69 in
S' Lateral: 2.1 cm
Weight: 2400 oz

## 2019-12-17 LAB — BASIC METABOLIC PANEL
Anion gap: 10 (ref 5–15)
BUN: 19 mg/dL (ref 8–23)
CO2: 23 mmol/L (ref 22–32)
Calcium: 8.7 mg/dL — ABNORMAL LOW (ref 8.9–10.3)
Chloride: 104 mmol/L (ref 98–111)
Creatinine, Ser: 0.79 mg/dL (ref 0.61–1.24)
GFR calc Af Amer: >60 mL/min (ref >60)
GFR calc non Af Amer: >60 mL/min (ref >60)
Glucose, Bld: 167 mg/dL — ABNORMAL HIGH (ref 70–99)
Potassium: 4.3 mmol/L (ref 3.5–5.1)
Sodium: 137 mmol/L (ref 135–145)

## 2019-12-17 LAB — GLUCOSE, CAPILLARY
Glucose-Capillary: 105 mg/dL — ABNORMAL HIGH (ref 70–99)
Glucose-Capillary: 151 mg/dL — ABNORMAL HIGH (ref 70–99)
Glucose-Capillary: 163 mg/dL — ABNORMAL HIGH (ref 70–99)

## 2019-12-17 LAB — CBG MONITORING, ED
Glucose-Capillary: 118 mg/dL — ABNORMAL HIGH (ref 70–99)
Glucose-Capillary: 144 mg/dL — ABNORMAL HIGH (ref 70–99)

## 2019-12-17 LAB — CREATININE, SERUM
Creatinine, Ser: 0.95 mg/dL (ref 0.61–1.24)
GFR calc Af Amer: >60 mL/min (ref >60)
GFR calc non Af Amer: >60 mL/min (ref >60)

## 2019-12-17 LAB — TROPONIN I (HIGH SENSITIVITY)
Troponin I (High Sensitivity): 59 ng/L — ABNORMAL HIGH (ref <18)
Troponin I (High Sensitivity): 108 ng/L (ref <18)
Troponin I (High Sensitivity): 136 ng/L (ref <18)

## 2019-12-17 LAB — RESPIRATORY PANEL BY RT PCR (FLU A&B, COVID)
Influenza A by PCR: NEGATIVE
Influenza B by PCR: NEGATIVE
SARS Coronavirus 2 by RT PCR: NEGATIVE

## 2019-12-17 LAB — HEMOGLOBIN A1C
Hgb A1c MFr Bld: 7.1 % — ABNORMAL HIGH (ref 4.8–5.6)
Mean Plasma Glucose: 157.07 mg/dL

## 2019-12-17 LAB — HIV ANTIBODY (ROUTINE TESTING W REFLEX): HIV Screen 4th Generation wRfx: NONREACTIVE

## 2019-12-17 LAB — HEPARIN LEVEL (UNFRACTIONATED): Heparin Unfractionated: 0.17 IU/mL — ABNORMAL LOW (ref 0.30–0.70)

## 2019-12-17 MED ORDER — ASPIRIN 81 MG PO CHEW
81.0000 mg | CHEWABLE_TABLET | Freq: Every day | ORAL | Status: DC
  Administered 2019-12-17 – 2019-12-20 (×3): 81 mg via ORAL
  Filled 2019-12-17 (×3): qty 1

## 2019-12-17 MED ORDER — ACETAMINOPHEN 650 MG RE SUPP: 650.0000 mg | Freq: Four times a day (QID) | RECTAL | Status: DC | PRN

## 2019-12-17 MED ORDER — ATORVASTATIN CALCIUM 80 MG PO TABS
80.0000 mg | ORAL_TABLET | Freq: Every day | ORAL | Status: DC
  Administered 2019-12-18 – 2019-12-20 (×3): 80 mg via ORAL
  Filled 2019-12-17 (×3): qty 1

## 2019-12-17 MED ORDER — DOXAZOSIN MESYLATE 2 MG PO TABS
2.0000 mg | ORAL_TABLET | Freq: Every morning | ORAL | Status: DC
  Administered 2019-12-17 – 2019-12-20 (×4): 2 mg via ORAL
  Filled 2019-12-17 (×6): qty 1

## 2019-12-17 MED ORDER — HEPARIN (PORCINE) 25000 UT/250ML-% IV SOLN
1250.0000 [IU]/h | INTRAVENOUS | Status: DC
  Administered 2019-12-17: 800 [IU]/h via INTRAVENOUS
  Administered 2019-12-18: 950 [IU]/h via INTRAVENOUS
  Administered 2019-12-19: 1250 [IU]/h via INTRAVENOUS
  Filled 2019-12-17 (×3): qty 250

## 2019-12-17 MED ORDER — ONDANSETRON HCL 4 MG PO TABS: 4.0000 mg | ORAL_TABLET | Freq: Four times a day (QID) | ORAL | Status: DC | PRN

## 2019-12-17 MED ORDER — LEVOTHYROXINE SODIUM 50 MCG PO TABS: 50.0000 ug | ORAL_TABLET | Freq: Every day | ORAL | Status: DC

## 2019-12-17 MED ORDER — PRAMIPEXOLE DIHYDROCHLORIDE 0.125 MG PO TABS
0.1250 mg | ORAL_TABLET | Freq: Every day | ORAL | Status: DC
  Administered 2019-12-18 – 2019-12-20 (×3): 0.125 mg via ORAL
  Filled 2019-12-17 (×4): qty 1

## 2019-12-17 MED ORDER — LEVOTHYROXINE SODIUM 75 MCG PO TABS
75.0000 ug | ORAL_TABLET | Freq: Every day | ORAL | Status: DC
  Administered 2019-12-17 – 2019-12-20 (×4): 75 ug via ORAL
  Filled 2019-12-17 (×5): qty 1

## 2019-12-17 MED ORDER — ACETAMINOPHEN 325 MG PO TABS: 650.0000 mg | ORAL_TABLET | Freq: Four times a day (QID) | ORAL | Status: DC | PRN

## 2019-12-17 MED ORDER — ATORVASTATIN CALCIUM 10 MG PO TABS: 20.0000 mg | ORAL_TABLET | Freq: Every day | ORAL | Status: DC

## 2019-12-17 MED ORDER — ONDANSETRON HCL 4 MG/2ML IJ SOLN: 4.0000 mg | Freq: Four times a day (QID) | INTRAMUSCULAR | Status: DC | PRN

## 2019-12-17 MED ORDER — METOPROLOL TARTRATE 12.5 MG HALF TABLET
12.5000 mg | ORAL_TABLET | Freq: Two times a day (BID) | ORAL | Status: DC
  Administered 2019-12-17 – 2019-12-21 (×9): 12.5 mg via ORAL
  Filled 2019-12-17 (×8): qty 1

## 2019-12-17 MED ORDER — ENOXAPARIN SODIUM 40 MG/0.4ML ~~LOC~~ SOLN: 40.0000 mg | SUBCUTANEOUS | Status: DC

## 2019-12-17 MED ORDER — ROPINIROLE HCL 1 MG PO TABS
2.0000 mg | ORAL_TABLET | Freq: Every day | ORAL | Status: DC
  Filled 2019-12-17: qty 2

## 2019-12-17 MED ORDER — HEPARIN BOLUS VIA INFUSION
4000.0000 [IU] | Freq: Once | INTRAVENOUS | Status: AC
  Administered 2019-12-17: 4000 [IU] via INTRAVENOUS
  Filled 2019-12-17: qty 4000

## 2019-12-17 MED ORDER — INSULIN ASPART 100 UNIT/ML ~~LOC~~ SOLN
0.0000 [IU] | SUBCUTANEOUS | Status: DC
  Administered 2019-12-17 (×2): 2 [IU] via SUBCUTANEOUS
  Administered 2019-12-17 – 2019-12-18 (×2): 1 [IU] via SUBCUTANEOUS
  Administered 2019-12-18: 3 [IU] via SUBCUTANEOUS
  Administered 2019-12-18: 2 [IU] via SUBCUTANEOUS
  Administered 2019-12-18: 1 [IU] via SUBCUTANEOUS
  Administered 2019-12-19: 3 [IU] via SUBCUTANEOUS
  Administered 2019-12-19: 2 [IU] via SUBCUTANEOUS
  Administered 2019-12-19: 1 [IU] via SUBCUTANEOUS
  Administered 2019-12-19: 2 [IU] via SUBCUTANEOUS
  Administered 2019-12-19: 1 [IU] via SUBCUTANEOUS
  Administered 2019-12-20 (×2): 2 [IU] via SUBCUTANEOUS
  Administered 2019-12-20: 3 [IU] via SUBCUTANEOUS
  Administered 2019-12-20 (×2): 2 [IU] via SUBCUTANEOUS
  Administered 2019-12-20: 1 [IU] via SUBCUTANEOUS
  Administered 2019-12-21: 2 [IU] via SUBCUTANEOUS

## 2019-12-17 MED ORDER — NITROGLYCERIN 0.4 MG SL SUBL: 0.4000 mg | SUBLINGUAL_TABLET | SUBLINGUAL | Status: DC | PRN

## 2019-12-17 MED ORDER — LISINOPRIL 10 MG PO TABS
10.0000 mg | ORAL_TABLET | Freq: Every day | ORAL | Status: DC
  Administered 2019-12-17 – 2019-12-20 (×4): 10 mg via ORAL
  Filled 2019-12-17 (×4): qty 1

## 2019-12-17 NOTE — Progress Notes (Signed)
ANTICOAGULATION CONSULT NOTE  Pharmacy Consult for heparin Indication: chest pain/ACS  No Known Allergies  Patient Measurements: Height: 5\' 9"  (175.3 cm) Weight: 68 kg (150 lb) IBW/kg (Calculated) : 70.7 Heparin Dosing Weight: 68 kg   Vital Signs: Temp: 98.1 F (36.7 C) (10/02 1128) Temp Source: Oral (10/02 1128) BP: 123/75 (10/02 1128) Pulse Rate: 60 (10/02 1128)  Labs: Recent Labs    12/16/19 2043 12/16/19 2043 12/16/19 2240 12/17/19 0215 12/17/19 0553 12/17/19 1844  HGB 13.6   < >  --  13.5 13.0  --   HCT 41.2  --   --  40.6 40.1  --   PLT 229  --   --  217 222  --   LABPROT 12.8  --   --   --   --   --   INR 1.0  --   --   --   --   --   HEPARINUNFRC  --   --   --   --   --  0.17*  CREATININE 1.02  --   --  0.95 0.79  --   TROPONINIHS 13   < > 59* 108* 136*  --    < > = values in this interval not displayed.    Estimated Creatinine Clearance: 83.8 mL/min (by C-G formula based on SCr of 0.79 mg/dL).   Medical History: Past Medical History:  Diagnosis Date  . Acid reflux   . Arthritis   . BPH (benign prostatic hyperplasia)    mild  . Depression   . Diabetes mellitus without complication ([place])    type II  . Heart murmur    with AVSC echo 2009  . History of pneumonia as a child   . Hypercholesteremia   . Hypothyroidism   . RLS (restless legs syndrome)   . Sleep apnea    does not use a cpap,primarily with upper airway resistance syndrome AHI 2.35/hr, RDI 10.2/hr ([name])  . Urinary frequency   . Wears glasses   . Wears hearing aid    both ears    Medications:  Medications Prior to Admission  Medication Sig Dispense Refill Last Dose  . aspirin 81 MG tablet Take 81 mg by mouth daily.   12/16/2019 at Unknown time  . atorvastatin (LIPITOR) 20 MG tablet Take 20 mg by mouth daily.    12/16/2019 at Unknown time  . canagliflozin (INVOKANA) 100 MG TABS tablet Take 100 mg by mouth daily.   12/16/2019 at Unknown time  . cholestyramine light (PREVALITE) 4  GM/DOSE powder Take 1 g by mouth daily as needed (For stomach).   12/15/2019 at Unknown time  . doxazosin (CARDURA) 2 MG tablet Take 2 mg by mouth every morning. Takes for urinary freq   12/16/2019 at Unknown time  . LEVEMIR FLEXTOUCH 100 UNIT/ML FlexPen Inject 20 Units into the skin at bedtime.   12/15/2019 at Unknown time  . levothyroxine (SYNTHROID, LEVOTHROID) 50 MCG tablet Take 50 mcg by mouth daily before breakfast.   12/16/2019 at Unknown time  . lisinopril (PRINIVIL,ZESTRIL) 10 MG tablet Take 10 mg by mouth daily.   12/16/2019 at Unknown time  . metFORMIN (GLUCOPHAGE) 1000 MG tablet Take 1,000 mg by mouth 2 (two) times daily with a meal.   12/16/2019 at Unknown time  . ONGLYZA 5 MG TABS tablet Take 5 mg by mouth.    12/16/2019 at Unknown time  . rOPINIRole (REQUIP) 1 MG tablet Take 2 mg by mouth at bedtime.  12/16/2019 at Unknown time    Assessment: 64 YOM who presents with chest heaviness and slight shortness of breath. Troponins elevated concerning for ACS. Pharmacy consulted to start IV heparin.   Initial heparin level subtherapeutic, no infusion issues per RN report.  Goal of Therapy:  Heparin level 0.3-0.7 units/ml Monitor platelets by anticoagulation protocol: Yes   Plan: Increase heparin gtt to 950 units/hr F/u 6 hour heparin level F/u cath plans in AM  Bertis Ruddy, PharmD Clinical Pharmacist Please check AMION for all [place] numbers 12/17/2019 7:17 PM

## 2019-12-17 NOTE — ED Notes (Signed)
Date and time results received: 12/17/19 0019 (use smartphrase ".now" to insert current time)  Test: Troponin Critical Value: 70  Name of Provider Notified: Hal Hope  Orders Received? Or Actions Taken?: No new orders at this time. Pt denies chest pain.

## 2019-12-17 NOTE — Progress Notes (Signed)
*  PRELIMINARY RESULTS* Echocardiogram 2D Echocardiogram has been performed.  Steven Lawrence 12/17/2019, 3:57 PM

## 2019-12-17 NOTE — Progress Notes (Signed)
ANTICOAGULATION CONSULT NOTE - Initial Consult  Pharmacy Consult for heparin Indication: chest pain/ACS  No Known Allergies  Patient Measurements: Height: 5\' 9"  (175.3 cm) Weight: 68 kg (150 lb) IBW/kg (Calculated) : 70.7 Heparin Dosing Weight: 68 kg   Vital Signs: BP: 124/66 (10/02 0610) Pulse Rate: 60 (10/02 0610)  Labs: Recent Labs    12/16/19 2043 12/16/19 2043 12/16/19 2240 12/17/19 0215 12/17/19 0553  HGB 13.6   < >  --  13.5 13.0  HCT 41.2  --   --  40.6 40.1  PLT 229  --   --  217 222  LABPROT 12.8  --   --   --   --   INR 1.0  --   --   --   --   CREATININE 1.02  --   --  0.95 0.79  TROPONINIHS 13   < > 59* 108* 136*   < > = values in this interval not displayed.    Estimated Creatinine Clearance: 83.8 mL/min (by C-G formula based on SCr of 0.79 mg/dL).   Medical History: Past Medical History:  Diagnosis Date  . Acid reflux   . Arthritis   . BPH (benign prostatic hyperplasia)    mild  . Depression   . Diabetes mellitus without complication ([place])    type II  . Heart murmur    with AVSC echo 2009  . History of pneumonia as a child   . Hypercholesteremia   . Hypothyroidism   . RLS (restless legs syndrome)   . Sleep apnea    does not use a cpap,primarily with upper airway resistance syndrome AHI 2.35/hr, RDI 10.2/hr ([name])  . Urinary frequency   . Wears glasses   . Wears hearing aid    both ears    Medications:  (Not in a hospital admission)   Assessment: 33 YOM who presents with chest heaviness and slight shortness of breath. Troponins elevated concerning for ACS. Pharmacy consulted to start IV heparin.   H/H and Plt wnl. SCr wnl.   Goal of Therapy:  Heparin level 0.3-0.7 units/ml Monitor platelets by anticoagulation protocol: Yes   Plan:  -D/c SQ Lovenox for VTE prophylaxis (no doses given) -Heparin 4000 units IV bolus followed by IV heparin at 800 units/hr  -F/u 8 hr HL -Monitor daily HL, CBC and s/s of bleeding   Albertina Parr, PharmD., BCPS, BCCCP Clinical Pharmacist Clinical phone for 12/17/19 until 10pm: [phone] If after 10pm, please refer to [facility] for unit-specific pharmacist

## 2019-12-17 NOTE — Consult Note (Addendum)
Cardiology Consultation:   Patient ID: AHKEEM GOEDE MRN: [id number]; DOB: 06/07/1950  Admit date: 12/16/2019 Date of Consult: 12/17/2019  Primary Care Provider: Lennie Odor, [facility] Cardiologist: Fransico Him, MD  [facility] Electrophysiologist:  None    Patient Profile:   JR MILLIRON is a 69 y.o. male with a hx of DM, HLD, RLS, BPH, depression and tobacco use who is being seen today for the evaluation of chest pain/+troponin at the request of Dr. Christeen Douglas.  History of Present Illness:   Mr. Coxe is a 69 yo male with PMH noted above. He was seen by Dr. Radford Pax back in 2014 for OSA management. Has not been seen by cardiology since that time.  Reports he believes his mother did have some type of coronary disease, but he is unaware of type.  States she passed at the age of 37.  He has been diabetic since 1995, and recently transitioned to insulin about 2 years ago.  He reports being very active and lives independently.  He does not normally have any episodes of anginal symptoms with day-to-day activity.  Reports yesterday afternoon he was using the push mower on an embankment and developed a brief episode of centralized dull chest heaviness.  States this episode lasted 5 to 10 minutes, and resolved with rest.  Later that evening around 7 PM he had a recurrence of the chest heaviness but reports symptoms were much more severe.  Did feel slightly short of breath.  States he became concerned given the intensity of symptoms and called EMS.  Was given 324 of aspirin and 2 sublingual nitroglycerin with some improvement in symptoms.  Was mostly pain-free on arrival to the ED.  In the ED his labs showed stable electrolytes, Cr 1.02, hsTn 13>>59>>108>>136, WBC 7.0, Hgb 13.6. CXR was negative. EKG showed SR with subtle ST elevation in anterior leads, with borderline ST depression in inferior leads. He was started on ASA, statin and metoprolol. Admitted to IM for further management and  cardiology asked to consult.  States he has not had a recurrence of chest pain since admitted to the ER.  Past Medical History:  Diagnosis Date  . Acid reflux   . Arthritis   . BPH (benign prostatic hyperplasia)    mild  . Depression   . Diabetes mellitus without complication ([place])    type II  . Heart murmur    with AVSC echo 2009  . History of pneumonia as a child   . Hypercholesteremia   . Hypothyroidism   . RLS (restless legs syndrome)   . Sleep apnea    does not use a cpap,primarily with upper airway resistance syndrome AHI 2.35/hr, RDI 10.2/hr ([name])  . Urinary frequency   . Wears glasses   . Wears hearing aid    both ears    Past Surgical History:  Procedure Laterality Date  . ANTERIOR LAT LUMBAR FUSION Left 01/04/2015   Procedure: ANTERIOR LATERAL LUMBAR FUSION 1 LEVEL;  Surgeon: Phylliss Bob, MD;  Location: [facility];  Service: Orthopedics;  Laterality: Left;  Left sided lateral interbody fusion lumbar 3-4 with instrumentation and allograft  . COLONOSCOPY    . ESOPHAGOGASTRODUODENOSCOPY    . INGUINAL HERNIA REPAIR Right 08/12/2012   Procedure: HERNIA REPAIR INGUINAL ADULT;  Surgeon: Imogene Burn. Georgette Dover, MD;  Location: [facility];  Service: General;  Laterality: Right;  . INSERTION OF MESH Right 08/12/2012   Procedure: INSERTION OF MESH;  Surgeon: Imogene Burn. Tsuei, MD;  Location: [facility];  Service: General;  Laterality: Right;  . TONSILLECTOMY    . VASECTOMY  1985     Home Medications:  Prior to Admission medications   Medication Sig Start Date End Date Taking? Authorizing Provider  aspirin 81 MG tablet Take 81 mg by mouth daily.   Yes [provider]  atorvastatin (LIPITOR) 20 MG tablet Take 20 mg by mouth daily.  07/23/12  Yes [provider]  canagliflozin (INVOKANA) 100 MG TABS tablet Take 100 mg by mouth daily.   Yes [provider]  cholestyramine light (PREVALITE) 4 GM/DOSE powder Take 1 g by mouth daily as  needed (For stomach).   Yes [provider]  doxazosin (CARDURA) 2 MG tablet Take 2 mg by mouth every morning. Takes for urinary freq 07/27/12  Yes [provider]  LEVEMIR FLEXTOUCH 100 UNIT/ML FlexPen Inject 20 Units into the skin at bedtime. 12/02/19  Yes [provider]  levothyroxine (SYNTHROID, LEVOTHROID) 50 MCG tablet Take 50 mcg by mouth daily before breakfast.   Yes [provider]  lisinopril (PRINIVIL,ZESTRIL) 10 MG tablet Take 10 mg by mouth daily.   Yes [provider]  metFORMIN (GLUCOPHAGE) 1000 MG tablet Take 1,000 mg by mouth 2 (two) times daily with a meal.   Yes [provider]  ONGLYZA 5 MG TABS tablet Take 5 mg by mouth.  07/04/12  Yes [provider]  rOPINIRole (REQUIP) 1 MG tablet Take 2 mg by mouth at bedtime.   Yes [provider]    Inpatient Medications: Scheduled Meds: . aspirin  81 mg Oral Daily  . atorvastatin  20 mg Oral Daily  . doxazosin  2 mg Oral q morning - 10a  . enoxaparin (LOVENOX) injection  40 mg Subcutaneous Q24H  . insulin aspart  0-9 Units Subcutaneous Q4H  . levothyroxine  75 mcg Oral Q0600  . lisinopril  10 mg Oral Daily  . metoprolol tartrate  12.5 mg Oral BID  . pramipexole  0.125 mg Oral QHS   Continuous Infusions:  PRN Meds: acetaminophen **OR** acetaminophen, nitroGLYCERIN, ondansetron **OR** ondansetron (ZOFRAN) IV  Allergies:   No Known Allergies  Social History:   Social History   Socioeconomic History  . Marital status: Married    Spouse name: Not on file  . Number of children: Not on file  . Years of education: Not on file  . Highest education level: Not on file  Occupational History  . Not on file  Tobacco Use  . Smoking status: Current Every Day Smoker    Packs/day: 1.00    Years: 53.00    Pack years: 53.00  . Smokeless tobacco: Never Used  Substance and Sexual Activity  . Alcohol use: Yes    Comment: occ  . Drug use: No  . Sexual activity:  Not on file  Other Topics Concern  . Not on file  Social History Narrative  . Not on file   Social Determinants of Health   Financial Resource Strain:   . Difficulty of Paying Living Expenses: Not on file  Food Insecurity:   . Worried About Charity fundraiser in the Last Year: Not on file  . Ran Out of Food in the Last Year: Not on file  Transportation Needs:   . Lack of Transportation (Medical): Not on file  . Lack of Transportation (Non-Medical): Not on file  Physical Activity:   . Days of Exercise per Week: Not on file  .  Minutes of Exercise per Session: Not on file  Stress:   . Feeling of Stress : Not on file  Social Connections:   . Frequency of Communication with Friends and Family: Not on file  . Frequency of Social Gatherings with Friends and Family: Not on file  . Attends Religious Services: Not on file  . Active Member of Clubs or Organizations: Not on file  . Attends Archivist Meetings: Not on file  . Marital Status: Not on file  Intimate Partner Violence:   . Fear of Current or Ex-Partner: Not on file  . Emotionally Abused: Not on file  . Physically Abused: Not on file  . Sexually Abused: Not on file    Family History:    Family History  Problem Relation Age of Onset  . Diabetes Mother   . Heart disease Mother   . Parkinson's disease Mother   . Heart disease Father   . Cancer Father        melonma/Lung  . Diabetes Sister   . Diabetes Brother   . Heart disease Brother   . Multiple sclerosis Daughter      ROS:  Please see the history of present illness.   All other ROS reviewed and negative.     Physical Exam/Data:   Vitals:   12/17/19 0315 12/17/19 0345 12/17/19 0415 12/17/19 0610  BP: (!) 125/57 118/65 114/64 124/66  Pulse: 64 65 63 60  Resp: 12 15 15 19   Temp:      TempSrc:      SpO2: 97% 97% 98% 100%  Weight:      Height:       No intake or output data in the 24 hours ending 12/17/19 0958 Last 3 Weights 12/16/2019  12/27/2014 12/16/2012  Weight (lbs) 150 lb 152 lb 11.2 oz 170 lb 12.8 oz  Weight (kg) 68.04 kg 69.264 kg 77.474 kg     Body mass index is 22.15 kg/m.  General:  Well nourished, well developed, in no acute distress HEENT: normal Lymph: no adenopathy Neck: no JVD Endocrine:  No thryomegaly Vascular: No carotid bruits Cardiac:  normal S1, S2; RRR; 2/6 systolic murmur  Lungs:  clear to auscultation bilaterally, no wheezing, rhonchi or rales  Abd: soft, nontender, no hepatomegaly  Ext: no edema Musculoskeletal:  No deformities, BUE and BLE strength normal and equal Skin: warm and dry  Neuro:  CNs 2-12 intact, no focal abnormalities noted Psych:  Normal affect   EKG:  The EKG was personally reviewed and demonstrates: SR with subtle ST elevation in anterior leads, with borderline ST depression in inferior leads   Relevant CV Studies:  Echo: 08/2008  Study Conclusions   1. Left ventricle: The cavity size was normal. Systolic function was   normal. The estimated ejection fraction was in the range of 55%   to 65%. Wall motion was normal; there were no regional wall   motion abnormalities.  2. Aortic valve: Trileaflet; mildly thickened, mildly calcified   leaflets.    Laboratory Data:  High Sensitivity Troponin:   Recent Labs  Lab 12/16/19 2043 12/16/19 2240 12/17/19 0215 12/17/19 0553  TROPONINIHS 13 59* 108* 136*     Chemistry Recent Labs  Lab 12/16/19 2043 12/17/19 0215 12/17/19 0553  NA 138  --  137  K 4.0  --  4.3  CL 104  --  104  CO2 22  --  23  GLUCOSE 265*  --  167*  BUN 20  --  19  CREATININE 1.02 0.95 0.79  CALCIUM 9.1  --  8.7*  GFRNONAA >60 >60 >60  GFRAA >60 >60 >60  ANIONGAP 12  --  10    Recent Labs  Lab 12/16/19 2043  PROT 5.9*  ALBUMIN 3.7  AST 17  ALT 19  ALKPHOS 51  BILITOT 0.8   Hematology Recent Labs  Lab 12/16/19 2043 12/17/19 0215 12/17/19 0553  WBC 7.0 6.7 6.9  RBC 4.29 4.16* 4.13*  HGB 13.6 13.5 13.0  HCT  41.2 40.6 40.1  MCV 96.0 97.6 97.1  MCH 31.7 32.5 31.5  MCHC 33.0 33.3 32.4  RDW 13.9 13.9 14.0  PLT 229 217 222   BNPNo results for input(s): BNP, PROBNP in the last 168 hours.  DDimer No results for input(s): DDIMER in the last 168 hours.   Radiology/Studies:  DG Chest Port 1 View  Result Date: 12/16/2019 CLINICAL DATA:  Chest pain EXAM: PORTABLE CHEST 1 VIEW.  Patient is rotated. COMPARISON:  Comparison chest x-ray 12/27/2014, CT chest 08/01/2019 FINDINGS: The heart size and mediastinal contours are within normal limits. Aortic arch calcifications. Left base linear atelectasis with elevation of the left hemidiaphragm. Redemonstration of a subcentimeter calcified lesion at the left base likely representing a granuloma. Similar finding within the right upper lobe. No focal consolidation. No pulmonary edema. No pleural effusion. No pneumothorax. No acute osseous abnormality. IMPRESSION: No active cardiopulmonary disease. Electronically Signed   By: Iven Finn M.D.   On: 12/16/2019 21:20   TIMI Risk Score for Unstable Angina or Non-ST Elevation MI:   The patient's TIMI risk score is 4, which indicates a 20% risk of all cause mortality, new or recurrent myocardial infarction or need for urgent revascularization in the next 14 days.    Assessment and Plan:   ASHDEN SONNENBERG is a 69 y.o. male with a hx of DM, HLD, RLS, BPH, depression and tobacco use who is being seen today for the evaluation of chest pain/+troponin at the request of Dr. Christeen Douglas.  1.  Non-STEMI: Initial episode while using the push mower yesterday afternoon with recurrence of symptoms later that evening.  Initial troponin 13>> 59>> 108>> 136.  EKG showed sinus rhythm with subtle ST elevation in anterior leads, with borderline ST depression in inferior leads.  Has been pain-free since arrival to the ER.  Does have concerning risk factors with history of tobacco use, diabetes, and hyperlipidemia.  He will need further  evaluation with coronary angiography. --Start IV heparin --Continue aspirin statin beta-blocker lisinopril --The patient understands that risks included but are not limited to stroke (1 in 1000), death (1 in 1000), kidney failure [usually temporary] (1 in 500), bleeding (1 in 200), allergic reaction [possibly serious] (1 in 200).  --We will plan for cardiac catheterization on Monday unless develops EKG changes or uncontrolled chest pain over the weekend.  Will place diet order.  2.  Diabetes: Reports she was diagnosed in 1995, and has been on oral therapy up until about 2 years ago.  Hgb A1c in 2016 7.8 --[facility] inpatient, hold Metformin, Invokana --Check hemoglobin A1c  3.  Hyperlipidemia: On atorvastatin 20 mg prior to arrival, further increase to 80 mg daily --Check fasting lipid panel  4.  Tobacco use: Cessation advised  5.  Heart murmur: Reports history of the same --Check echo  For questions or updates, please contact [place] Please consult [email or web address] for contact info under    Signed, Reino Bellis, NP  12/17/2019 9:58 AM As above,  patient seen and examined.  Briefly he is a 69 year old male with past medical history of diabetes mellitus, hyperlipidemia, restless leg syndrome, benign prostatic hypertrophy, tobacco abuse admitted with non-ST elevation myocardial infarction.  Patient developed substernal chest pain described as a pressure while mowing his yard yesterday.  There was associated dyspnea but no nausea or diaphoresis.  He rested and the pain resolved after 5 minutes.  However the pain returned last evening after dinner.  EMS was called.  It ultimately improved with sublingual nitroglycerin x2 with a duration total of 20 to 30 minutes.  He has not had pain since then.  The pain did not radiate nor was it pleuritic.  Cardiology asked to evaluate.  Physical exam shows a 3/6 systolic murmur radiating to the carotids.  S2 does not sound to be diminished.  He has  diminished distal pulses.  Electrocardiogram shows sinus rhythm with no ST changes.  Chest x-ray negative.  Creatinine 0.79.  Troponin has increased to 136 from 13.  Hemoglobin is 13.  1 non-ST elevation myocardial infarction-patient has ruled in.  We will continue with aspirin and treat with heparin, statin and beta-blocker.  Plan cardiac catheterization.  We will proceed on Monday or sooner if patient has refractory symptoms.  The risks and benefits including myocardial infarction, CVA and death discussed and he agrees to proceed.  2 aortic stenosis murmur-we will arrange echocardiogram to further assess.  3 tobacco abuse-patient counseled on discontinuing.  4 hyperlipidemia-given now documented coronary artery disease we will increase Lipitor to 80 mg daily.  Check lipids and liver in 12 weeks.  5 diabetes mellitus-follow CBGs.  Per primary care.  [name], MD

## 2019-12-17 NOTE — ED Notes (Signed)
Spoke with Dr. Hal Hope about pt not taking ropinirole anymore due to it being ineffective. And medication was not given.

## 2019-12-17 NOTE — H&P (Signed)
History and Physical    Steven Lawrence:[id number] DOB: 1950-06-20 DOA: 12/16/2019  PCP: Lennie Odor, PA  Patient coming from: Home.  Chief Complaint: Chest pain.  HPI: Steven Lawrence is a 69 y.o. male with history of tobacco abuse, diabetes mellitus type 2, hypertension, hypothyroidism, restless leg syndrome started to develop chest pain while he was mowing his yard.  At the time the chest pain was retrosternal lasted for 5 minutes resolved without any intervention.  He did not have any associated shortness of breath or palpitations.  Later in the evening patient started having chest pain again which was retrosternal pressure-like and he called EMS.  Patient was given sublingual nitroglycerin following which chest pain resolved.  ED Course: In the ER patient was chest pain-free EKG was showing normal sinus rhythm with nonspecific ST-T changes chest x-ray unremarkable Covid test negative.  Initial high sensitive troponin was 13 seconds 59 cardiology was notified at this time recommended admission and they will see patient in consult.  Patient was given aspirin.  Review of Systems: As per HPI, rest all negative.   Past Medical History:  Diagnosis Date  . Acid reflux   . Arthritis   . BPH (benign prostatic hyperplasia)    mild  . Depression   . Diabetes mellitus without complication ([place])    type II  . Heart murmur    with AVSC echo 2009  . History of pneumonia as a child   . Hypercholesteremia   . Hypothyroidism   . RLS (restless legs syndrome)   . Sleep apnea    does not use a cpap,primarily with upper airway resistance syndrome AHI 2.35/hr, RDI 10.2/hr ([name])  . Urinary frequency   . Wears glasses   . Wears hearing aid    both ears    Past Surgical History:  Procedure Laterality Date  . ANTERIOR LAT LUMBAR FUSION Left 01/04/2015   Procedure: ANTERIOR LATERAL LUMBAR FUSION 1 LEVEL;  Surgeon: Phylliss Bob, MD;  Location: [facility];  Service: Orthopedics;  Laterality: Left;   Left sided lateral interbody fusion lumbar 3-4 with instrumentation and allograft  . COLONOSCOPY    . ESOPHAGOGASTRODUODENOSCOPY    . INGUINAL HERNIA REPAIR Right 08/12/2012   Procedure: HERNIA REPAIR INGUINAL ADULT;  Surgeon: Imogene Burn. Georgette Dover, MD;  Location: [facility];  Service: General;  Laterality: Right;  . INSERTION OF MESH Right 08/12/2012   Procedure: INSERTION OF MESH;  Surgeon: Imogene Burn. Georgette Dover, MD;  Location: [facility];  Service: General;  Laterality: Right;  . TONSILLECTOMY    . VASECTOMY  1985     reports that he has been smoking. He has a 53.00 pack-year smoking history. He has never used smokeless tobacco. He reports current alcohol use. He reports that he does not use drugs.  No Known Allergies  Family History  Problem Relation Age of Onset  . Diabetes Mother   . Heart disease Mother   . Parkinson's disease Mother   . Heart disease Father   . Cancer Father        melonma/Lung  . Diabetes Sister   . Diabetes Brother   . Heart disease Brother   . Multiple sclerosis Daughter     Prior to Admission medications   Medication Sig Start Date End Date Taking? Authorizing Provider  aspirin 81 MG tablet Take 81 mg by mouth daily.   Yes [provider]  atorvastatin (LIPITOR) 20 MG tablet Take 20 mg by mouth daily.  07/23/12  Yes [provider]  canagliflozin (INVOKANA) 100 MG TABS tablet Take 100 mg by mouth daily.   Yes [provider]  cholestyramine light (PREVALITE) 4 GM/DOSE powder Take 1 g by mouth daily as needed (For stomach).   Yes [provider]  doxazosin (CARDURA) 2 MG tablet Take 2 mg by mouth every morning. Takes for urinary freq 07/27/12  Yes [provider]  LEVEMIR FLEXTOUCH 100 UNIT/ML FlexPen Inject 20 Units into the skin at bedtime. 12/02/19  Yes [provider]  levothyroxine (SYNTHROID, LEVOTHROID) 50 MCG tablet Take 50 mcg by mouth daily before breakfast.   Yes [provider]  lisinopril (PRINIVIL,ZESTRIL) 10 MG tablet Take 10 mg by mouth daily.   Yes [provider]  metFORMIN (GLUCOPHAGE) 1000 MG tablet Take 1,000 mg by mouth 2 (two) times daily with a meal.   Yes [provider]  ONGLYZA 5 MG TABS tablet Take 5 mg by mouth.  07/04/12  Yes [provider]  rOPINIRole (REQUIP) 1 MG tablet Take 2 mg by mouth at bedtime.   Yes [provider]    Physical Exam: Constitutional: Moderately built and nourished. Vitals:   12/17/19 0030 12/17/19 0045 12/17/19 0100 12/17/19 0115  BP: 123/71 112/69 117/72 121/60  Pulse: 68 66 65 70  Resp: 16 16 15 16   Temp:      TempSrc:      SpO2: 97% 97% 97% 98%  Weight:      Height:       Eyes: Anicteric no pallor. ENMT: No discharge from the ears eyes nose or mouth. Neck: No mass felt.  No neck rigidity. Respiratory: No rhonchi or crepitations. Cardiovascular: S1-S2 heard. Abdomen: Soft nontender bowel sounds present. Musculoskeletal: No edema. Skin: No rash. Neurologic: Alert awake oriented to time place and person.  Moves all extremities. Psychiatric: Appears normal.  Normal affect.   Labs on Admission: I have personally reviewed following labs and imaging studies  CBC: Recent Labs  Lab 12/16/19 2043  WBC 7.0  NEUTROABS 3.7  HGB 13.6  HCT 41.2  MCV 96.0  PLT 622   Basic Metabolic Panel: Recent Labs  Lab 12/16/19 2043  NA 138  K 4.0  CL 104  CO2 22  GLUCOSE 265*  BUN 20  CREATININE 1.02  CALCIUM 9.1   GFR: Estimated Creatinine Clearance: 65.7 mL/min (by C-G formula based on SCr of 1.02 mg/dL). Liver Function Tests: Recent Labs  Lab 12/16/19 2043  AST 17  ALT 19  ALKPHOS 51  BILITOT 0.8  PROT 5.9*  ALBUMIN 3.7   Recent Labs  Lab 12/16/19 2043  LIPASE 48   No results for input(s): AMMONIA in the last 168 hours. Coagulation Profile: Recent Labs  Lab 12/16/19 2043  INR 1.0   Cardiac Enzymes: No results for input(s): CKTOTAL, CKMB,  CKMBINDEX, TROPONINI in the last 168 hours. BNP (last 3 results) No results for input(s): PROBNP in the last 8760 hours. HbA1C: No results for input(s): HGBA1C in the last 72 hours. CBG: No results for input(s): GLUCAP in the last 168 hours. Lipid Profile: No results for input(s): CHOL, HDL, LDLCALC, TRIG, CHOLHDL, LDLDIRECT in the last 72 hours. Thyroid Function Tests: No results for input(s): TSH, T4TOTAL, FREET4, T3FREE, THYROIDAB in the last 72 hours. Anemia Panel: No results for input(s): VITAMINB12, FOLATE, FERRITIN, TIBC, IRON, RETICCTPCT in the last 72 hours. Urine analysis:    Component Value Date/Time   COLORURINE YELLOW 12/27/2014 [place]  12/27/2014 1438   LABSPEC 1.019 12/27/2014 1438   PHURINE 5.0 12/27/2014 1438   GLUCOSEU >1000 (A) 12/27/2014 1438   HGBUR NEGATIVE 12/27/2014 1438   BILIRUBINUR NEGATIVE 12/27/2014 1438   KETONESUR NEGATIVE 12/27/2014 1438   PROTEINUR NEGATIVE 12/27/2014 1438   UROBILINOGEN 0.2 12/27/2014 1438   NITRITE NEGATIVE 12/27/2014 1438   LEUKOCYTESUR NEGATIVE 12/27/2014 1438   Sepsis Labs: @LABRCNTIP (procalcitonin:4,lacticidven:4) ) Recent Results (from the past 240 hour(s))  Respiratory Panel by RT PCR (Flu A&B, Covid) - Nasopharyngeal Swab     Status: None   Collection Time: 12/16/19 10:26 PM   Specimen: Nasopharyngeal Swab  Result Value Ref Range Status   SARS Coronavirus 2 by RT PCR NEGATIVE NEGATIVE Final    Comment: (NOTE) SARS-CoV-2 target nucleic acids are NOT DETECTED.  The SARS-CoV-2 RNA is generally detectable in upper respiratoy specimens during the acute phase of infection. The lowest concentration of SARS-CoV-2 viral copies this assay can detect is 131 copies/mL. A negative result does not preclude SARS-Cov-2 infection and should not be used as the sole basis for treatment or other patient management decisions. A negative result may occur with  improper specimen collection/handling, submission of  specimen other than nasopharyngeal swab, presence of viral mutation(s) within the areas targeted by this assay, and inadequate number of viral copies (<131 copies/mL). A negative result must be combined with clinical observations, patient history, and epidemiological information. The expected result is Negative.  Fact Sheet for Patients:  PinkCheek.be  Fact Sheet for Healthcare Providers:  GravelBags.it  This test is no t yet approved or cleared by the [place] FDA and  has been authorized for detection and/or diagnosis of SARS-CoV-2 by FDA under an Emergency Use Authorization (EUA). This EUA will remain  in effect (meaning this test can be used) for the duration of the COVID-19 declaration under Section 564(b)(1) of the Act, 21 U.S.C. section 360bbb-3(b)(1), unless the authorization is terminated or revoked sooner.     Influenza A by PCR NEGATIVE NEGATIVE Final   Influenza B by PCR NEGATIVE NEGATIVE Final    Comment: (NOTE) The Xpert Xpress SARS-CoV-2/FLU/RSV assay is intended as an aid in  the diagnosis of influenza from Nasopharyngeal swab specimens and  should not be used as a sole basis for treatment. Nasal washings and  aspirates are unacceptable for Xpert Xpress SARS-CoV-2/FLU/RSV  testing.  Fact Sheet for Patients: PinkCheek.be  Fact Sheet for Healthcare Providers: GravelBags.it  This test is not yet approved or cleared by the [place] FDA and  has been authorized for detection and/or diagnosis of SARS-CoV-2 by  FDA under an Emergency Use Authorization (EUA). This EUA will remain  in effect (meaning this test can be used) for the duration of the  Covid-19 declaration under Section 564(b)(1) of the Act, 21  U.S.C. section 360bbb-3(b)(1), unless the authorization is  terminated or revoked. Performed at [facility], [place] [address],  [place], [place] [address]      Radiological Exams on Admission: DG Chest Port 1 View  Result Date: 12/16/2019 CLINICAL DATA:  Chest pain EXAM: PORTABLE CHEST 1 VIEW.  Patient is rotated. COMPARISON:  Comparison chest x-ray 12/27/2014, CT chest 08/01/2019 FINDINGS: The heart size and mediastinal contours are within normal limits. Aortic arch calcifications. Left base linear atelectasis with elevation of the left hemidiaphragm. Redemonstration of a subcentimeter calcified lesion at the left base likely representing a granuloma. Similar finding within the right upper lobe. No focal consolidation. No pulmonary edema. No pleural effusion. No  pneumothorax. No acute osseous abnormality. IMPRESSION: No active cardiopulmonary disease. Electronically Signed   By: Iven Finn M.D.   On: 12/16/2019 21:20    EKG: Independently reviewed.  Normal sinus rhythm with nonspecific T changes.  Assessment/Plan Principal Problem:   Chest pain Active Problems:   Obstructive sleep apnea of adult   DM (diabetes mellitus) (HCC)   Restless legs syndrome (RLS)   Essential hypertension   Hypothyroidism    1. Chest pain concerning for unstable angina/non-ST ovation MI -cardiology has been notified.  Will trend cardiac markers keep patient n.p.o. except medication anticipation of possible procedure.  Aspirin statins and beta-blockers if heart rate and blood pressure allows. 2. Hypertension on lisinopril. 3. Diabetes mellitus type 2 on Levemir 20 units patient takes at home.  Will decrease it by half since patient is n.p.o.  Currently scale coverage. 4. Restless leg syndrome on pramipexole. 5. Hypothyroidism on Synthroid. 6. Hyperlipidemia on statins. 7. Tobacco abuse -strongly advised about quitting.   DVT prophylaxis: Lovenox. Code Status: Full code. Family Communication: Patient's wife at the bedside. Disposition Plan: Home. Consults called: Cardiology. Admission status: Observation.   Rise Patience  MD [facility] Pager [phone].  If 7PM-7AM, please contact night-coverage [email or web address] Password [email or web address]  12/17/2019, 1:23 AM

## 2019-12-17 NOTE — Progress Notes (Signed)
PROGRESS NOTE    [name]  PPJ:[id number] DOB: [birth year] DOA: 12/16/2019 PCP: Lennie Odor, PA  Outpatient Specialists:   Brief Narrative:  Patient is a 69 year old male past medical history significant for tobacco use/abuse, diabetes mellitus type 2, hypertension, hypothyroidism and restless leg syndrome.  Patient was admitted with chest pain and elevated troponin.  Cardiology team has been consulted.  Cardiology is directing care.  Patient is not on heparin drip.  For likely cardiac catheterization on Monday, 12/19/2019.  No chest pain reported at the moment.  Echocardiogram result is pending.  Assessment & Plan:   Principal Problem:   Chest pain Active Problems:   Obstructive sleep apnea of adult   DM (diabetes mellitus) (HCC)   Restless legs syndrome (RLS)   Essential hypertension   Hypothyroidism  NSTEMI: -Chest pain has resolved.   -Cardiology input is appreciated.   -Heparin drip started by cardiology team. -Continue the medication. -For cardiac catheterization in 2 days. -Further management will depend on hospital course.    Hypotension: -Blood pressures controlled today (123/75 mmHg). -Continue doxazosin, lisinopril and metoprolol.  Diabetes mellitus:  -Continue sliding scale insulin coverage.   -Continue to monitor closely.  Hypothyroidism: -Continue Synthroid 75 MCG p.o. once daily.  Hyperlipidemia: -Continue Lipitor 80 mg p.o. once daily.  Tobacco abuse: -Counseled.  DVT prophylaxis: IV heparin drip Code Status: Full code Family Communication:  Disposition Plan: This will depend on hospital course.   Consultants:   Cardiology  Procedures:   Cardiac catheterization is planned for on 12/19/2019.  Echocardiogram.  Antimicrobials:   None   Subjective: No chest pain. No shortness of breath. No fever or chills.  Objective: Vitals:   12/17/19 0315 12/17/19 0345 12/17/19 0415 12/17/19 0610  BP: (!) 125/57 118/65 114/64 124/66    Pulse: 64 65 63 60  Resp: 12 15 15 19   Temp:      TempSrc:      SpO2: 97% 97% 98% 100%  Weight:      Height:       No intake or output data in the 24 hours ending 12/17/19 0916 Filed Weights   12/16/19 2046  Weight: 68 kg    Examination:  General exam: Appears calm and comfortable  Respiratory system: Clear to auscultation.  Cardiovascular system: S1 & S2, with ejection systolic murmur.  Gastrointestinal system: Abdomen is nondistended, soft and nontender. No organomegaly or masses felt. Normal bowel sounds heard. Central nervous system: Alert and oriented. No focal neurological deficits. Extremities: No leg edema.  Data Reviewed: I have personally reviewed following labs and imaging studies  CBC: Recent Labs  Lab 12/16/19 2043 12/17/19 0215 12/17/19 0553  WBC 7.0 6.7 6.9  NEUTROABS 3.7  --   --   HGB 13.6 13.5 13.0  HCT 41.2 40.6 40.1  MCV 96.0 97.6 97.1  PLT 229 217 580   Basic Metabolic Panel: Recent Labs  Lab 12/16/19 2043 12/17/19 0215 12/17/19 0553  NA 138  --  137  K 4.0  --  4.3  CL 104  --  104  CO2 22  --  23  GLUCOSE 265*  --  167*  BUN 20  --  19  CREATININE 1.02 0.95 0.79  CALCIUM 9.1  --  8.7*   GFR: Estimated Creatinine Clearance: 83.8 mL/min (by C-G formula based on SCr of 0.79 mg/dL). Liver Function Tests: Recent Labs  Lab 12/16/19 2043  AST 17  ALT 19  ALKPHOS 51  BILITOT 0.8  PROT 5.9*  ALBUMIN 3.7   Recent Labs  Lab 12/16/19 2043  LIPASE 48   No results for input(s): AMMONIA in the last 168 hours. Coagulation Profile: Recent Labs  Lab 12/16/19 2043  INR 1.0   Cardiac Enzymes: No results for input(s): CKTOTAL, CKMB, CKMBINDEX, TROPONINI in the last 168 hours. BNP (last 3 results) No results for input(s): PROBNP in the last 8760 hours. HbA1C: No results for input(s): HGBA1C in the last 72 hours. CBG: Recent Labs  Lab 12/17/19 0206  GLUCAP 118*   Lipid Profile: No results for input(s): CHOL, HDL, LDLCALC,  TRIG, CHOLHDL, LDLDIRECT in the last 72 hours. Thyroid Function Tests: No results for input(s): TSH, T4TOTAL, FREET4, T3FREE, THYROIDAB in the last 72 hours. Anemia Panel: No results for input(s): VITAMINB12, FOLATE, FERRITIN, TIBC, IRON, RETICCTPCT in the last 72 hours. Urine analysis:    Component Value Date/Time   COLORURINE YELLOW 12/27/2014 [place] 12/27/2014 1438   LABSPEC 1.019 12/27/2014 1438   PHURINE 5.0 12/27/2014 1438   GLUCOSEU >1000 (A) 12/27/2014 1438   HGBUR NEGATIVE 12/27/2014 1438   [place] 12/27/2014 1438   KETONESUR NEGATIVE 12/27/2014 1438   PROTEINUR NEGATIVE 12/27/2014 1438   UROBILINOGEN 0.2 12/27/2014 1438   NITRITE NEGATIVE 12/27/2014 1438   LEUKOCYTESUR NEGATIVE 12/27/2014 1438   Sepsis Labs: @LABRCNTIP (procalcitonin:4,lacticidven:4)  ) Recent Results (from the past 240 hour(s))  Respiratory Panel by RT PCR (Flu A&B, Covid) - Nasopharyngeal Swab     Status: None   Collection Time: 12/16/19 10:26 PM   Specimen: Nasopharyngeal Swab  Result Value Ref Range Status   SARS Coronavirus 2 by RT PCR NEGATIVE NEGATIVE Final    Comment: (NOTE) SARS-CoV-2 target nucleic acids are NOT DETECTED.  The SARS-CoV-2 RNA is generally detectable in upper respiratoy specimens during the acute phase of infection. The lowest concentration of SARS-CoV-2 viral copies this assay can detect is 131 copies/mL. A negative result does not preclude SARS-Cov-2 infection and should not be used as the sole basis for treatment or other patient management decisions. A negative result may occur with  improper specimen collection/handling, submission of specimen other than nasopharyngeal swab, presence of viral mutation(s) within the areas targeted by this assay, and inadequate number of viral copies (<131 copies/mL). A negative result must be combined with clinical observations, patient history, and epidemiological information. The expected result is  Negative.  Fact Sheet for Patients:  PinkCheek.be  Fact Sheet for Healthcare Providers:  GravelBags.it  This test is no t yet approved or cleared by the [place] FDA and  has been authorized for detection and/or diagnosis of SARS-CoV-2 by FDA under an Emergency Use Authorization (EUA). This EUA will remain  in effect (meaning this test can be used) for the duration of the COVID-19 declaration under Section 564(b)(1) of the Act, 21 U.S.C. section 360bbb-3(b)(1), unless the authorization is terminated or revoked sooner.     Influenza A by PCR NEGATIVE NEGATIVE Final   Influenza B by PCR NEGATIVE NEGATIVE Final    Comment: (NOTE) The Xpert Xpress SARS-CoV-2/FLU/RSV assay is intended as an aid in  the diagnosis of influenza from Nasopharyngeal swab specimens and  should not be used as a sole basis for treatment. Nasal washings and  aspirates are unacceptable for Xpert Xpress SARS-CoV-2/FLU/RSV  testing.  Fact Sheet for Patients: PinkCheek.be  Fact Sheet for Healthcare Providers: GravelBags.it  This test is not yet approved or cleared by the [place] FDA and  has been authorized for detection and/or diagnosis  of SARS-CoV-2 by  FDA under an Emergency Use Authorization (EUA). This EUA will remain  in effect (meaning this test can be used) for the duration of the  Covid-19 declaration under Section 564(b)(1) of the Act, 21  U.S.C. section 360bbb-3(b)(1), unless the authorization is  terminated or revoked. Performed at [facility], [place] [address], [place], [place] [address]          Radiology Studies: DG Chest Port 1 View  Result Date: 12/16/2019 CLINICAL DATA:  Chest pain EXAM: PORTABLE CHEST 1 VIEW.  Patient is rotated. COMPARISON:  Comparison chest x-ray 12/27/2014, CT chest 08/01/2019 FINDINGS: The heart size and mediastinal contours are  within normal limits. Aortic arch calcifications. Left base linear atelectasis with elevation of the left hemidiaphragm. Redemonstration of a subcentimeter calcified lesion at the left base likely representing a granuloma. Similar finding within the right upper lobe. No focal consolidation. No pulmonary edema. No pleural effusion. No pneumothorax. No acute osseous abnormality. IMPRESSION: No active cardiopulmonary disease. Electronically Signed   By: Iven Finn M.D.   On: 12/16/2019 21:20        Scheduled Meds: . aspirin  81 mg Oral Daily  . atorvastatin  20 mg Oral Daily  . doxazosin  2 mg Oral q morning - 10a  . enoxaparin (LOVENOX) injection  40 mg Subcutaneous Q24H  . insulin aspart  0-9 Units Subcutaneous Q4H  . levothyroxine  75 mcg Oral Q0600  . lisinopril  10 mg Oral Daily  . metoprolol tartrate  12.5 mg Oral BID  . pramipexole  0.125 mg Oral QHS   Continuous Infusions:   LOS: 0 days    Time spent: 35 minutes    Dana Allan, MD  [facility] Pager #: [phone] 7PM-7AM contact night coverage as above

## 2019-12-18 LAB — GLUCOSE, CAPILLARY
Glucose-Capillary: 142 mg/dL — ABNORMAL HIGH (ref 70–99)
Glucose-Capillary: 142 mg/dL — ABNORMAL HIGH (ref 70–99)
Glucose-Capillary: 154 mg/dL — ABNORMAL HIGH (ref 70–99)
Glucose-Capillary: 229 mg/dL — ABNORMAL HIGH (ref 70–99)

## 2019-12-18 LAB — CBC
HCT: 43.3 % (ref 39.0–52.0)
Hemoglobin: 14.3 g/dL (ref 13.0–17.0)
MCH: 31.9 pg (ref 26.0–34.0)
MCHC: 33 g/dL (ref 30.0–36.0)
MCV: 96.7 fL (ref 80.0–100.0)
Platelets: 237 10*3/uL (ref 150–400)
RBC: 4.48 MIL/uL (ref 4.22–5.81)
RDW: 13.9 % (ref 11.5–15.5)
WBC: 7.3 10*3/uL (ref 4.0–10.5)
nRBC: 0 % (ref 0.0–0.2)

## 2019-12-18 LAB — HEPARIN LEVEL (UNFRACTIONATED)
Heparin Unfractionated: 0.18 IU/mL — ABNORMAL LOW (ref 0.30–0.70)
Heparin Unfractionated: 0.37 IU/mL (ref 0.30–0.70)
Heparin Unfractionated: 0.3 IU/mL (ref 0.30–0.70)

## 2019-12-18 MED ORDER — SODIUM CHLORIDE 0.9 % IV SOLN: 250.0000 mL | INTRAVENOUS | Status: DC | PRN

## 2019-12-18 MED ORDER — SODIUM CHLORIDE 0.9 % WEIGHT BASED INFUSION
3.0000 mL/kg/h | INTRAVENOUS | Status: AC
  Administered 2019-12-19: 3 mL/kg/h via INTRAVENOUS

## 2019-12-18 MED ORDER — HEPARIN BOLUS VIA INFUSION
1000.0000 [IU] | Freq: Once | INTRAVENOUS | Status: AC
  Administered 2019-12-18: 1000 [IU] via INTRAVENOUS
  Filled 2019-12-18: qty 1000

## 2019-12-18 MED ORDER — SODIUM CHLORIDE 0.9% FLUSH
3.0000 mL | Freq: Two times a day (BID) | INTRAVENOUS | Status: DC
  Administered 2019-12-18 – 2019-12-19 (×4): 3 mL via INTRAVENOUS

## 2019-12-18 MED ORDER — SODIUM CHLORIDE 0.9% FLUSH: 3.0000 mL | INTRAVENOUS | Status: DC | PRN

## 2019-12-18 MED ORDER — ASPIRIN 81 MG PO CHEW
81.0000 mg | CHEWABLE_TABLET | ORAL | Status: AC
  Administered 2019-12-19: 81 mg via ORAL
  Filled 2019-12-18: qty 1

## 2019-12-18 MED ORDER — SODIUM CHLORIDE 0.9 % WEIGHT BASED INFUSION
1.0000 mL/kg/h | INTRAVENOUS | Status: DC
  Administered 2019-12-19: 1 mL/kg/h via INTRAVENOUS

## 2019-12-18 NOTE — H&P (View-Only) (Signed)
Progress Note  Patient Name: Steven Lawrence Date of Encounter: 12/18/2019  [facility] Cardiologist: Fransico Him, MD  Subjective   No CP or dyspnea  Inpatient Medications    Scheduled Meds: . aspirin  81 mg Oral Daily  . atorvastatin  80 mg Oral Daily  . doxazosin  2 mg Oral q morning - 10a  . insulin aspart  0-9 Units Subcutaneous Q4H  . levothyroxine  75 mcg Oral Q0600  . lisinopril  10 mg Oral Daily  . metoprolol tartrate  12.5 mg Oral BID  . pramipexole  0.125 mg Oral QHS  . sodium chloride flush  3 mL Intravenous Q12H   Continuous Infusions: . heparin 1,150 Units/hr (12/18/19 0518)   PRN Meds: acetaminophen **OR** acetaminophen, nitroGLYCERIN, ondansetron **OR** ondansetron (ZOFRAN) IV   Vital Signs    Vitals:   12/17/19 2338 12/18/19 0328 12/18/19 0745 12/18/19 0829  BP: 104/76 (!) 103/59 115/71   Pulse: (!) 55 60 61 81  Resp: 16 14 16    Temp: 98.5 F (36.9 C) 98.4 F (36.9 C) 98.7 F (37.1 C)   TempSrc: Oral Oral Oral   SpO2: 98% 98% 97%   Weight:  66.5 kg    Height:        Intake/Output Summary (Last 24 hours) at 12/18/2019 0921 Last data filed at 12/18/2019 0300 Gross per 24 hour  Intake 590.45 ml  Output 1400 ml  Net -809.55 ml   Last 3 Weights 12/18/2019 12/16/2019 12/27/2014  Weight (lbs) 146 lb 11.2 oz 150 lb 152 lb 11.2 oz  Weight (kg) 66.543 kg 68.04 kg 69.264 kg      Telemetry    Sinus - Personally Reviewed   Physical Exam   GEN: No acute distress.   Neck: No JVD Cardiac: RRR, no murmurs, rubs, or gallops.  Respiratory: Clear to auscultation bilaterally. GI: Soft, nontender, non-distended  MS: No edema Neuro:  Nonfocal  Psych: Normal affect   Labs    High Sensitivity Troponin:   Recent Labs  Lab 12/16/19 2043 12/16/19 2240 12/17/19 0215 12/17/19 0553  TROPONINIHS 13 59* 108* 136*      Chemistry Recent Labs  Lab 12/16/19 2043 12/17/19 0215 12/17/19 0553  NA 138  --  137  K 4.0  --  4.3  CL 104  --  104    CO2 22  --  23  GLUCOSE 265*  --  167*  BUN 20  --  19  CREATININE 1.02 0.95 0.79  CALCIUM 9.1  --  8.7*  PROT 5.9*  --   --   ALBUMIN 3.7  --   --   AST 17  --   --   ALT 19  --   --   ALKPHOS 51  --   --   BILITOT 0.8  --   --   GFRNONAA >60 >60 >60  GFRAA >60 >60 >60  ANIONGAP 12  --  10     Hematology Recent Labs  Lab 12/17/19 0215 12/17/19 0553 12/18/19 0344  WBC 6.7 6.9 7.3  RBC 4.16* 4.13* 4.48  HGB 13.5 13.0 14.3  HCT 40.6 40.1 43.3  MCV 97.6 97.1 96.7  MCH 32.5 31.5 31.9  MCHC 33.3 32.4 33.0  RDW 13.9 14.0 13.9  PLT 217 222 237    Radiology    DG Chest Port 1 View  Result Date: 12/16/2019 CLINICAL DATA:  Chest pain EXAM: PORTABLE CHEST 1 VIEW.  Patient is rotated. COMPARISON:  Comparison chest  x-ray 12/27/2014, CT chest 08/01/2019 FINDINGS: The heart size and mediastinal contours are within normal limits. Aortic arch calcifications. Left base linear atelectasis with elevation of the left hemidiaphragm. Redemonstration of a subcentimeter calcified lesion at the left base likely representing a granuloma. Similar finding within the right upper lobe. No focal consolidation. No pulmonary edema. No pleural effusion. No pneumothorax. No acute osseous abnormality. IMPRESSION: No active cardiopulmonary disease. Electronically Signed   By: Iven Finn M.D.   On: 12/16/2019 21:20   ECHOCARDIOGRAM COMPLETE  Result Date: 12/17/2019    ECHOCARDIOGRAM REPORT   Patient Name:   Steven Lawrence Date of Exam: 12/17/2019 Medical Rec #:  [id number]    Height:       69.0 in Accession #:    [id number]   Weight:       150.0 lb Date of Birth:  [birth year]    BSA:          1.828 m Patient Age:    69 years     BP:           123/75 mmHg Patient Gender: M            HR:           60 bpm. Exam Location:  Inpatient Procedure: 2D Echo, Cardiac Doppler and Color Doppler Indications:    Chest Pain 786.50 / R07.9  History:        Patient has prior history of Echocardiogram examinations, most                  recent 08/23/2008. Risk Factors:Diabetes, Hypertension and                 Dyslipidemia.  Sonographer:    Alvino Chapel RCS Referring Phys: [place]  1. Left ventricular ejection fraction, by estimation, is 60 to 65%. The left ventricle has normal function. The left ventricle has no regional wall motion abnormalities. There is mild left ventricular hypertrophy. Left ventricular diastolic parameters were normal.  2. Right ventricular systolic function is normal. The right ventricular size is normal.  3. The mitral valve is normal in structure. Trivial mitral valve regurgitation. No evidence of mitral stenosis.  4. The aortic valve has an indeterminant number of cusps. There is moderate calcification of the aortic valve. There is moderate thickening of the aortic valve. Aortic valve regurgitation is not visualized. Moderate aortic valve stenosis.  5. The inferior vena cava is normal in size with greater than 50% respiratory variability, suggesting right atrial pressure of 3 mmHg. FINDINGS  Left Ventricle: Left ventricular ejection fraction, by estimation, is 60 to 65%. The left ventricle has normal function. The left ventricle has no regional wall motion abnormalities. The left ventricular internal cavity size was normal in size. There is  mild left ventricular hypertrophy. Left ventricular diastolic parameters were normal. Right Ventricle: The right ventricular size is normal. No increase in right ventricular wall thickness. Right ventricular systolic function is normal. Left Atrium: Left atrial size was normal in size. Right Atrium: Right atrial size was normal in size. Pericardium: There is no evidence of pericardial effusion. Mitral Valve: The mitral valve is normal in structure. There is mild thickening of the mitral valve leaflet(s). There is mild calcification of the mitral valve leaflet(s). Mild mitral annular calcification. Trivial mitral valve regurgitation. No evidence  of  mitral valve stenosis. Tricuspid Valve: The tricuspid valve is normal in structure. Tricuspid valve regurgitation is trivial. No evidence of tricuspid stenosis.  Aortic Valve: The aortic valve has an indeterminant number of cusps. There is moderate calcification of the aortic valve. There is moderate thickening of the aortic valve. There is moderate aortic valve annular calcification. Aortic valve regurgitation is not visualized. Moderate aortic stenosis is present. Aortic valve mean gradient measures 22.1 mmHg. Aortic valve peak gradient measures 37.3 mmHg. Aortic valve area, by VTI measures 1.11 cm. Pulmonic Valve: The pulmonic valve was not well visualized. Pulmonic valve regurgitation is not visualized. No evidence of pulmonic stenosis. Aorta: The aortic root is normal in size and structure. Pulmonary Artery: Indeterminant PASP, inadequate TR jet. Venous: The inferior vena cava is normal in size with greater than 50% respiratory variability, suggesting right atrial pressure of 3 mmHg. IAS/Shunts: No atrial level shunt detected by color flow Doppler.  LEFT VENTRICLE PLAX 2D LVIDd:         4.40 cm  Diastology LVIDs:         2.10 cm  LV e' medial:    8.27 cm/s LV PW:         1.10 cm  LV E/e' medial:  12.9 LV IVS:        1.10 cm  LV e' lateral:   9.03 cm/s LVOT diam:     1.80 cm  LV E/e' lateral: 11.8 LV SV:         66 LV SV Index:   36 LVOT Area:     2.54 cm  RIGHT VENTRICLE RV S prime:     15.60 cm/s TAPSE (M-mode): 2.8 cm LEFT ATRIUM             Index       RIGHT ATRIUM           Index LA diam:        3.00 cm 1.64 cm/m  RA Area:     18.70 cm LA Vol (A2C):   53.1 ml 29.04 ml/m RA Volume:   54.50 ml  29.81 ml/m LA Vol (A4C):   53.0 ml 28.99 ml/m LA Biplane Vol: 54.4 ml 29.76 ml/m  AORTIC VALVE AV Area (Vmax):    0.98 cm AV Area (Vmean):   0.94 cm AV Area (VTI):     1.11 cm AV Vmax:           305.23 cm/s AV Vmean:          222.681 cm/s AV VTI:            0.595 m AV Peak Grad:      37.3 mmHg AV Mean Grad:       22.1 mmHg LVOT Vmax:         118.00 cm/s LVOT Vmean:        82.600 cm/s LVOT VTI:          0.260 m LVOT/AV VTI ratio: 0.44  AORTA Ao Root diam: 3.20 cm MITRAL VALVE MV Area (PHT): 2.99 cm     SHUNTS MV Decel Time: 254 msec     Systemic VTI:  0.26 m MV E velocity: 107.00 cm/s  Systemic Diam: 1.80 cm MV A velocity: 96.80 cm/s MV E/A ratio:  1.11 Carlyle Dolly MD Electronically signed by Carlyle Dolly MD Signature Date/Time: 12/17/2019/4:04:09 PM    Final     Patient Profile     69 year old male with past medical history of diabetes mellitus, hyperlipidemia, restless leg syndrome, benign prostatic hypertrophy, tobacco abuse admitted with non-ST elevation myocardial infarction.  Echocardiogram shows normal LV function, mild left ventricular hypertrophy, moderate  aortic stenosis with mean gradient 22 mmHg.  Assessment & Plan    1 non-ST elevation myocardial infarction-patient remains pain-free.  Continue aspirin, heparin, beta-blocker and statin.  Plan is for cardiac catheterization tomorrow.  The risks and benefits were previously discussed including myocardial infarction, CVA and death and he agrees to proceed.  2 moderate aortic stenosis-noted on echocardiogram.  Would likely need aortic valve replacement if coronary artery bypass and graft necessary.  Otherwise will need follow-up echoes in the future.  3 tobacco abuse-patient previously counseled on discontinuing.  4 hyperlipidemia-continue Lipitor at 80 mg daily.  Check lipids and liver in 12 weeks.  5 diabetes mellitus-Per primary care.  For questions or updates, please contact [place] Please consult [email or web address] for contact info under        Signed, [name], MD  12/18/2019, 9:21 AM

## 2019-12-18 NOTE — Progress Notes (Signed)
ANTICOAGULATION CONSULT NOTE  Pharmacy Consult for heparin Indication: chest pain/ACS  No Known Allergies  Patient Measurements: Height: 5\' 9"  (175.3 cm) Weight: 66.5 kg (146 lb 11.2 oz) IBW/kg (Calculated) : 70.7 Heparin Dosing Weight: 68 kg   Vital Signs: Temp: 98.8 F (37.1 C) (10/03 1129) Temp Source: Oral (10/03 1129) BP: 109/70 (10/03 1129) Pulse Rate: 57 (10/03 1129)  Labs: Recent Labs    12/16/19 2043 12/16/19 2043 12/16/19 2240 12/17/19 0215 12/17/19 0215 12/17/19 0553 12/17/19 1844 12/18/19 0344 12/18/19 1050 12/18/19 1808  HGB 13.6   < >  --  13.5   < > 13.0  --  14.3  --   --   HCT 41.2   < >  --  40.6  --  40.1  --  43.3  --   --   PLT 229   < >  --  217  --  222  --  237  --   --   LABPROT 12.8  --   --   --   --   --   --   --   --   --   INR 1.0  --   --   --   --   --   --   --   --   --   HEPARINUNFRC  --   --   --   --   --   --    < > 0.18* 0.37 0.30  CREATININE 1.02  --   --  0.95  --  0.79  --   --   --   --   TROPONINIHS 13   < > 59* 108*  --  136*  --   --   --   --    < > = values in this interval not displayed.    Estimated Creatinine Clearance: 82 mL/min (by C-G formula based on SCr of 0.79 mg/dL).   Medical History: Past Medical History:  Diagnosis Date  . Acid reflux   . Arthritis   . BPH (benign prostatic hyperplasia)    mild  . Depression   . Diabetes mellitus without complication ([place])    type II  . Heart murmur    with AVSC echo 2009  . History of pneumonia as a child   . Hypercholesteremia   . Hypothyroidism   . RLS (restless legs syndrome)   . Sleep apnea    does not use a cpap,primarily with upper airway resistance syndrome AHI 2.35/hr, RDI 10.2/hr ([name])  . Urinary frequency   . Wears glasses   . Wears hearing aid    both ears    Medications:  Medications Prior to Admission  Medication Sig Dispense Refill Last Dose  . aspirin 81 MG tablet Take 81 mg by mouth daily.   12/16/2019 at Unknown time  .  atorvastatin (LIPITOR) 20 MG tablet Take 20 mg by mouth daily.    12/16/2019 at Unknown time  . canagliflozin (INVOKANA) 100 MG TABS tablet Take 100 mg by mouth daily.   12/16/2019 at Unknown time  . cholestyramine light (PREVALITE) 4 GM/DOSE powder Take 1 g by mouth daily as needed (For stomach).   12/15/2019 at Unknown time  . doxazosin (CARDURA) 2 MG tablet Take 2 mg by mouth every morning. Takes for urinary freq   12/16/2019 at Unknown time  . LEVEMIR FLEXTOUCH 100 UNIT/ML FlexPen Inject 20 Units into the skin at bedtime.   12/15/2019 at Unknown time  .  levothyroxine (SYNTHROID, LEVOTHROID) 50 MCG tablet Take 50 mcg by mouth daily before breakfast.   12/16/2019 at Unknown time  . lisinopril (PRINIVIL,ZESTRIL) 10 MG tablet Take 10 mg by mouth daily.   12/16/2019 at Unknown time  . metFORMIN (GLUCOPHAGE) 1000 MG tablet Take 1,000 mg by mouth 2 (two) times daily with a meal.   12/16/2019 at Unknown time  . ONGLYZA 5 MG TABS tablet Take 5 mg by mouth.    12/16/2019 at Unknown time  . rOPINIRole (REQUIP) 1 MG tablet Take 2 mg by mouth at bedtime.   12/16/2019 at Unknown time    Assessment: 42 YOM who presents with chest heaviness and slight shortness of breath. Troponins elevated concerning for ACS. Pharmacy consulted to start IV heparin.   Heparin level low end therapeutic at 0.3 s/p rate increase to 1150 units/hr  Goal of Therapy:  Heparin level 0.3-0.7 units/ml Monitor platelets by anticoagulation protocol: Yes   Plan: Increase heparin gtt to 1250 units/hr F/u heparin level with AM labs  Bertis Ruddy, PharmD Clinical Pharmacist Please check AMION for all [facility] numbers 12/18/2019 6:54 PM

## 2019-12-18 NOTE — Progress Notes (Signed)
ANTICOAGULATION CONSULT NOTE - Follow Up Consult  Pharmacy Consult for heparin Indication: NSTEMI  Labs: Recent Labs    12/16/19 2043 12/16/19 2043 12/16/19 2240 12/17/19 0215 12/17/19 0215 12/17/19 0553 12/17/19 1844 12/18/19 0344  HGB 13.6   < >  --  13.5   < > 13.0  --  14.3  HCT 41.2   < >  --  40.6  --  40.1  --  43.3  PLT 229   < >  --  217  --  222  --  237  LABPROT 12.8  --   --   --   --   --   --   --   INR 1.0  --   --   --   --   --   --   --   HEPARINUNFRC  --   --   --   --   --   --  0.17* 0.18*  CREATININE 1.02  --   --  0.95  --  0.79  --   --   TROPONINIHS 13   < > 59* 108*  --  136*  --   --    < > = values in this interval not displayed.    Assessment: 69yo male subtherapeutic on heparin after rate change; no gtt issues or signs of bleeding per RN.  Goal of Therapy:  Heparin level 0.3-0.7 units/ml   Plan:  Will rebolus with heparin 1000 units and increase heparin gtt by 3 units/kg/hr to 1150 units/hr and check level in 6 hours.    Wynona Neat, PharmD, BCPS  12/18/2019,5:13 AM

## 2019-12-18 NOTE — Progress Notes (Signed)
PROGRESS NOTE    Steven Lawrence  EXN:[id number] DOB: 02/14/1951 DOA: 12/16/2019 PCP: Lennie Odor, PA  Outpatient Specialists:   Brief Narrative:  Patient is a 69 year old male past medical history significant for tobacco use/abuse, diabetes mellitus type 2, hypertension, hypothyroidism and restless leg syndrome.  Patient was admitted with chest pain and elevated troponin.  Cardiology team has been consulted.  Cardiology is directing care.  Patient is not on heparin drip.  For likely cardiac catheterization on Monday, 12/19/2019.  No chest pain reported at the moment.    12/18/2019: Patient seen.  No further chest pain reported.  For cardiac catheterization tomorrow. Echocardiogram reviewed: 1. Left ventricular ejection fraction, by estimation, is 60 to 65%. The  left ventricle has normal function. The left ventricle has no regional  wall motion abnormalities. There is mild left ventricular hypertrophy.  Left ventricular diastolic parameters  were normal.  2. Right ventricular systolic function is normal. The right ventricular  size is normal.  3. The mitral valve is normal in structure. Trivial mitral valve  regurgitation. No evidence of mitral stenosis.  4. The aortic valve has an indeterminant number of cusps. There is  moderate calcification of the aortic valve. There is moderate thickening  of the aortic valve. Aortic valve regurgitation is not visualized.  Moderate aortic valve stenosis.  5. The inferior vena cava is normal in size with greater than 50%  respiratory variability, suggesting right atrial pressure of 3 mmHg.   Assessment & Plan:   Principal Problem:   Chest pain Active Problems:   Obstructive sleep apnea of adult   DM (diabetes mellitus) (HCC)   Restless legs syndrome (RLS)   Essential hypertension   Hypothyroidism   NSTEMI (non-ST elevated myocardial infarction) ([place])  NSTEMI: -Chest pain has resolved.   -Cardiology input is appreciated.   -Heparin  drip started by cardiology team. -Continue the medication. -For cardiac catheterization tomorrow. -Further management will depend on cardiac catheterization  Moderate aortic stenosis: -Further management depend on cardiac catheterization finding.  Diabetes mellitus:  -Continue sliding scale insulin coverage.   -Continue to monitor closely.  Hypothyroidism: -Continue Synthroid 75 MCG p.o. once daily.  Hyperlipidemia: -Continue Lipitor 80 mg p.o. once daily.  Tobacco abuse: -Counseled.  DVT prophylaxis: IV heparin drip Code Status: Full code Family Communication:  Disposition Plan: This will depend on hospital course.   Consultants:   Cardiology  Procedures:   Cardiac catheterization is planned for on 12/19/2019.  Echocardiogram.  Antimicrobials:   None   Subjective: No chest pain. No shortness of breath. No fever or chills.  Objective: Vitals:   12/18/19 0328 12/18/19 0745 12/18/19 0829 12/18/19 1129  BP: (!) 103/59 115/71  109/70  Pulse: 60 61 81 (!) 57  Resp: 14 16  16   Temp: 98.4 F (36.9 C) 98.7 F (37.1 C)  98.8 F (37.1 C)  TempSrc: Oral Oral  Oral  SpO2: 98% 97%  97%  Weight: 66.5 kg     Height:        Intake/Output Summary (Last 24 hours) at 12/18/2019 1717 Last data filed at 12/18/2019 1300 Gross per 24 hour  Intake 110.45 ml  Output --  Net 110.45 ml   Filed Weights   12/16/19 2046 12/18/19 0328  Weight: 68 kg 66.5 kg    Examination:  General exam: Appears calm and comfortable  Respiratory system: Clear to auscultation.  Cardiovascular system: S1 & S2, with ejection systolic murmur.  Gastrointestinal system: Abdomen is nondistended, soft and nontender. No  organomegaly or masses felt. Normal bowel sounds heard. Central nervous system: Alert and oriented. No focal neurological deficits. Extremities: No leg edema.  Data Reviewed: I have personally reviewed following labs and imaging studies  CBC: Recent Labs  Lab  12/16/19 2043 12/17/19 0215 12/17/19 0553 12/18/19 0344  WBC 7.0 6.7 6.9 7.3  NEUTROABS 3.7  --   --   --   HGB 13.6 13.5 13.0 14.3  HCT 41.2 40.6 40.1 43.3  MCV 96.0 97.6 97.1 96.7  PLT 229 217 222 520   Basic Metabolic Panel: Recent Labs  Lab 12/16/19 2043 12/17/19 0215 12/17/19 0553  NA 138  --  137  K 4.0  --  4.3  CL 104  --  104  CO2 22  --  23  GLUCOSE 265*  --  167*  BUN 20  --  19  CREATININE 1.02 0.95 0.79  CALCIUM 9.1  --  8.7*   GFR: Estimated Creatinine Clearance: 82 mL/min (by C-G formula based on SCr of 0.79 mg/dL). Liver Function Tests: Recent Labs  Lab 12/16/19 2043  AST 17  ALT 19  ALKPHOS 51  BILITOT 0.8  PROT 5.9*  ALBUMIN 3.7   Recent Labs  Lab 12/16/19 2043  LIPASE 48   No results for input(s): AMMONIA in the last 168 hours. Coagulation Profile: Recent Labs  Lab 12/16/19 2043  INR 1.0   Cardiac Enzymes: No results for input(s): CKTOTAL, CKMB, CKMBINDEX, TROPONINI in the last 168 hours. BNP (last 3 results) No results for input(s): PROBNP in the last 8760 hours. HbA1C: Recent Labs    12/17/19 1318  HGBA1C 7.1*   CBG: Recent Labs  Lab 12/17/19 2045 12/17/19 2335 12/18/19 0325 12/18/19 1130 12/18/19 1636  GLUCAP 151* 105* 142* 142* 154*   Lipid Profile: No results for input(s): CHOL, HDL, LDLCALC, TRIG, CHOLHDL, LDLDIRECT in the last 72 hours. Thyroid Function Tests: No results for input(s): TSH, T4TOTAL, FREET4, T3FREE, THYROIDAB in the last 72 hours. Anemia Panel: No results for input(s): VITAMINB12, FOLATE, FERRITIN, TIBC, IRON, RETICCTPCT in the last 72 hours. Urine analysis:    Component Value Date/Time   COLORURINE YELLOW 12/27/2014 [place] 12/27/2014 1438   LABSPEC 1.019 12/27/2014 1438   PHURINE 5.0 12/27/2014 1438   GLUCOSEU >1000 (A) 12/27/2014 1438   HGBUR NEGATIVE 12/27/2014 1438   [place] 12/27/2014 1438   KETONESUR NEGATIVE 12/27/2014 1438   PROTEINUR NEGATIVE  12/27/2014 1438   UROBILINOGEN 0.2 12/27/2014 1438   NITRITE NEGATIVE 12/27/2014 1438   LEUKOCYTESUR NEGATIVE 12/27/2014 1438   Sepsis Labs: @LABRCNTIP (procalcitonin:4,lacticidven:4)  ) Recent Results (from the past 240 hour(s))  Respiratory Panel by RT PCR (Flu A&B, Covid) - Nasopharyngeal Swab     Status: None   Collection Time: 12/16/19 10:26 PM   Specimen: Nasopharyngeal Swab  Result Value Ref Range Status   SARS Coronavirus 2 by RT PCR NEGATIVE NEGATIVE Final    Comment: (NOTE) SARS-CoV-2 target nucleic acids are NOT DETECTED.  The SARS-CoV-2 RNA is generally detectable in upper respiratoy specimens during the acute phase of infection. The lowest concentration of SARS-CoV-2 viral copies this assay can detect is 131 copies/mL. A negative result does not preclude SARS-Cov-2 infection and should not be used as the sole basis for treatment or other patient management decisions. A negative result may occur with  improper specimen collection/handling, submission of specimen other than nasopharyngeal swab, presence of viral mutation(s) within the areas targeted by this assay, and inadequate number of viral  copies (<131 copies/mL). A negative result must be combined with clinical observations, patient history, and epidemiological information. The expected result is Negative.  Fact Sheet for Patients:  PinkCheek.be  Fact Sheet for Healthcare Providers:  GravelBags.it  This test is no t yet approved or cleared by the [place] FDA and  has been authorized for detection and/or diagnosis of SARS-CoV-2 by FDA under an Emergency Use Authorization (EUA). This EUA will remain  in effect (meaning this test can be used) for the duration of the COVID-19 declaration under Section 564(b)(1) of the Act, 21 U.S.C. section 360bbb-3(b)(1), unless the authorization is terminated or revoked sooner.     Influenza A by PCR NEGATIVE  NEGATIVE Final   Influenza B by PCR NEGATIVE NEGATIVE Final    Comment: (NOTE) The Xpert Xpress SARS-CoV-2/FLU/RSV assay is intended as an aid in  the diagnosis of influenza from Nasopharyngeal swab specimens and  should not be used as a sole basis for treatment. Nasal washings and  aspirates are unacceptable for Xpert Xpress SARS-CoV-2/FLU/RSV  testing.  Fact Sheet for Patients: PinkCheek.be  Fact Sheet for Healthcare Providers: GravelBags.it  This test is not yet approved or cleared by the [place] FDA and  has been authorized for detection and/or diagnosis of SARS-CoV-2 by  FDA under an Emergency Use Authorization (EUA). This EUA will remain  in effect (meaning this test can be used) for the duration of the  Covid-19 declaration under Section 564(b)(1) of the Act, 21  U.S.C. section 360bbb-3(b)(1), unless the authorization is  terminated or revoked. Performed at [facility], [place] [address] [place], [place] [address]          Radiology Studies: DG Chest Port 1 View  Result Date: 12/16/2019 CLINICAL DATA:  Chest pain EXAM: PORTABLE CHEST 1 VIEW.  Patient is rotated. COMPARISON:  Comparison chest x-ray 12/27/2014, CT chest 08/01/2019 FINDINGS: The heart size and mediastinal contours are within normal limits. Aortic arch calcifications. Left base linear atelectasis with elevation of the left hemidiaphragm. Redemonstration of a subcentimeter calcified lesion at the left base likely representing a granuloma. Similar finding within the right upper lobe. No focal consolidation. No pulmonary edema. No pleural effusion. No pneumothorax. No acute osseous abnormality. IMPRESSION: No active cardiopulmonary disease. Electronically Signed   By: Iven Finn M.D.   On: 12/16/2019 21:20   ECHOCARDIOGRAM COMPLETE  Result Date: 12/17/2019    ECHOCARDIOGRAM REPORT   Patient Name:   LEARY MCNULTY Date of Exam: 12/17/2019  Medical Rec #:  [id number]    Height:       69.0 in Accession #:    [id number]   Weight:       150.0 lb Date of Birth:  May 03, 1950    BSA:          1.828 m Patient Age:    67 years     BP:           123/75 mmHg Patient Gender: M            HR:           60 bpm. Exam Location:  Inpatient Procedure: 2D Echo, Cardiac Doppler and Color Doppler Indications:    Chest Pain 786.50 / R07.9  History:        Patient has prior history of Echocardiogram examinations, most                 recent 08/23/2008. Risk Factors:Diabetes, Hypertension and  Dyslipidemia.  Sonographer:    Alvino Chapel RCS Referring Phys: [place]  1. Left ventricular ejection fraction, by estimation, is 60 to 65%. The left ventricle has normal function. The left ventricle has no regional wall motion abnormalities. There is mild left ventricular hypertrophy. Left ventricular diastolic parameters were normal.  2. Right ventricular systolic function is normal. The right ventricular size is normal.  3. The mitral valve is normal in structure. Trivial mitral valve regurgitation. No evidence of mitral stenosis.  4. The aortic valve has an indeterminant number of cusps. There is moderate calcification of the aortic valve. There is moderate thickening of the aortic valve. Aortic valve regurgitation is not visualized. Moderate aortic valve stenosis.  5. The inferior vena cava is normal in size with greater than 50% respiratory variability, suggesting right atrial pressure of 3 mmHg. FINDINGS  Left Ventricle: Left ventricular ejection fraction, by estimation, is 60 to 65%. The left ventricle has normal function. The left ventricle has no regional wall motion abnormalities. The left ventricular internal cavity size was normal in size. There is  mild left ventricular hypertrophy. Left ventricular diastolic parameters were normal. Right Ventricle: The right ventricular size is normal. No increase in right ventricular wall  thickness. Right ventricular systolic function is normal. Left Atrium: Left atrial size was normal in size. Right Atrium: Right atrial size was normal in size. Pericardium: There is no evidence of pericardial effusion. Mitral Valve: The mitral valve is normal in structure. There is mild thickening of the mitral valve leaflet(s). There is mild calcification of the mitral valve leaflet(s). Mild mitral annular calcification. Trivial mitral valve regurgitation. No evidence  of mitral valve stenosis. Tricuspid Valve: The tricuspid valve is normal in structure. Tricuspid valve regurgitation is trivial. No evidence of tricuspid stenosis. Aortic Valve: The aortic valve has an indeterminant number of cusps. There is moderate calcification of the aortic valve. There is moderate thickening of the aortic valve. There is moderate aortic valve annular calcification. Aortic valve regurgitation is not visualized. Moderate aortic stenosis is present. Aortic valve mean gradient measures 22.1 mmHg. Aortic valve peak gradient measures 37.3 mmHg. Aortic valve area, by VTI measures 1.11 cm. Pulmonic Valve: The pulmonic valve was not well visualized. Pulmonic valve regurgitation is not visualized. No evidence of pulmonic stenosis. Aorta: The aortic root is normal in size and structure. Pulmonary Artery: Indeterminant PASP, inadequate TR jet. Venous: The inferior vena cava is normal in size with greater than 50% respiratory variability, suggesting right atrial pressure of 3 mmHg. IAS/Shunts: No atrial level shunt detected by color flow Doppler.  LEFT VENTRICLE PLAX 2D LVIDd:         4.40 cm  Diastology LVIDs:         2.10 cm  LV e' medial:    8.27 cm/s LV PW:         1.10 cm  LV E/e' medial:  12.9 LV IVS:        1.10 cm  LV e' lateral:   9.03 cm/s LVOT diam:     1.80 cm  LV E/e' lateral: 11.8 LV SV:         66 LV SV Index:   36 LVOT Area:     2.54 cm  RIGHT VENTRICLE RV S prime:     15.60 cm/s TAPSE (M-mode): 2.8 cm LEFT ATRIUM              Index       RIGHT ATRIUM  Index LA diam:        3.00 cm 1.64 cm/m  RA Area:     18.70 cm LA Vol (A2C):   53.1 ml 29.04 ml/m RA Volume:   54.50 ml  29.81 ml/m LA Vol (A4C):   53.0 ml 28.99 ml/m LA Biplane Vol: 54.4 ml 29.76 ml/m  AORTIC VALVE AV Area (Vmax):    0.98 cm AV Area (Vmean):   0.94 cm AV Area (VTI):     1.11 cm AV Vmax:           305.23 cm/s AV Vmean:          222.681 cm/s AV VTI:            0.595 m AV Peak Grad:      37.3 mmHg AV Mean Grad:      22.1 mmHg LVOT Vmax:         118.00 cm/s LVOT Vmean:        82.600 cm/s LVOT VTI:          0.260 m LVOT/AV VTI ratio: 0.44  AORTA Ao Root diam: 3.20 cm MITRAL VALVE MV Area (PHT): 2.99 cm     SHUNTS MV Decel Time: 254 msec     Systemic VTI:  0.26 m MV E velocity: 107.00 cm/s  Systemic Diam: 1.80 cm MV A velocity: 96.80 cm/s MV E/A ratio:  1.11 Carlyle Dolly MD Electronically signed by Carlyle Dolly MD Signature Date/Time: 12/17/2019/4:04:09 PM    Final         Scheduled Meds: . aspirin  81 mg Oral Daily  . [START ON 12/19/2019] aspirin  81 mg Oral Pre-Cath  . atorvastatin  80 mg Oral Daily  . doxazosin  2 mg Oral q morning - 10a  . insulin aspart  0-9 Units Subcutaneous Q4H  . levothyroxine  75 mcg Oral Q0600  . lisinopril  10 mg Oral Daily  . metoprolol tartrate  12.5 mg Oral BID  . pramipexole  0.125 mg Oral QHS  . sodium chloride flush  3 mL Intravenous Q12H   Continuous Infusions: . sodium chloride    . [START ON 12/19/2019] sodium chloride     Followed by  . [START ON 12/19/2019] sodium chloride    . heparin 1,150 Units/hr (12/18/19 0518)     LOS: 1 day    Time spent: 25 minutes    Dana Allan, MD  [facility] Pager #: [phone] 7PM-7AM contact night coverage as above

## 2019-12-18 NOTE — Progress Notes (Signed)
ANTICOAGULATION CONSULT NOTE - Follow Up Consult  Pharmacy Consult for heparin Indication: NSTEMI  Labs: Recent Labs    12/16/19 2043 12/16/19 2043 12/16/19 2240 12/17/19 0215 12/17/19 0215 12/17/19 0553 12/17/19 1844 12/18/19 0344 12/18/19 1050  HGB 13.6   < >  --  13.5   < > 13.0  --  14.3  --   HCT 41.2   < >  --  40.6  --  40.1  --  43.3  --   PLT 229   < >  --  217  --  222  --  237  --   LABPROT 12.8  --   --   --   --   --   --   --   --   INR 1.0  --   --   --   --   --   --   --   --   HEPARINUNFRC  --   --   --   --   --   --  0.17* 0.18* 0.37  CREATININE 1.02  --   --  0.95  --  0.79  --   --   --   TROPONINIHS 13   < > 59* 108*  --  136*  --   --   --    < > = values in this interval not displayed.    Assessment: 69yo male therapeutic on heparin after rebolus and rate change; no gtt issues or signs of bleeding noted. CBC is stable and within normal limits.  Goal of Therapy:  Heparin level 0.3-0.7 units/ml   Plan:  Will continue heparin gtt at 1150 units/hr and check level in 6 hours.   Daily Heparin and CBC  Norina Buzzard, PharmD PGY1 Pharmacy Resident 12/18/2019 12:25 PM

## 2019-12-18 NOTE — Progress Notes (Signed)
Progress Note  Patient Name: Steven Lawrence Date of Encounter: 12/18/2019  [facility] Cardiologist: Fransico Him, MD  Subjective   No CP or dyspnea  Inpatient Medications    Scheduled Meds: . aspirin  81 mg Oral Daily  . atorvastatin  80 mg Oral Daily  . doxazosin  2 mg Oral q morning - 10a  . insulin aspart  0-9 Units Subcutaneous Q4H  . levothyroxine  75 mcg Oral Q0600  . lisinopril  10 mg Oral Daily  . metoprolol tartrate  12.5 mg Oral BID  . pramipexole  0.125 mg Oral QHS  . sodium chloride flush  3 mL Intravenous Q12H   Continuous Infusions: . heparin 1,150 Units/hr (12/18/19 0518)   PRN Meds: acetaminophen **OR** acetaminophen, nitroGLYCERIN, ondansetron **OR** ondansetron (ZOFRAN) IV   Vital Signs    Vitals:   12/17/19 2338 12/18/19 0328 12/18/19 0745 12/18/19 0829  BP: 104/76 (!) 103/59 115/71   Pulse: (!) 55 60 61 81  Resp: 16 14 16    Temp: 98.5 F (36.9 C) 98.4 F (36.9 C) 98.7 F (37.1 C)   TempSrc: Oral Oral Oral   SpO2: 98% 98% 97%   Weight:  66.5 kg    Height:        Intake/Output Summary (Last 24 hours) at 12/18/2019 0921 Last data filed at 12/18/2019 0300 Gross per 24 hour  Intake 590.45 ml  Output 1400 ml  Net -809.55 ml   Last 3 Weights 12/18/2019 12/16/2019 12/27/2014  Weight (lbs) 146 lb 11.2 oz 150 lb 152 lb 11.2 oz  Weight (kg) 66.543 kg 68.04 kg 69.264 kg      Telemetry    Sinus - Personally Reviewed   Physical Exam   GEN: No acute distress.   Neck: No JVD Cardiac: RRR, no murmurs, rubs, or gallops.  Respiratory: Clear to auscultation bilaterally. GI: Soft, nontender, non-distended  MS: No edema Neuro:  Nonfocal  Psych: Normal affect   Labs    High Sensitivity Troponin:   Recent Labs  Lab 12/16/19 2043 12/16/19 2240 12/17/19 0215 12/17/19 0553  TROPONINIHS 13 59* 108* 136*      Chemistry Recent Labs  Lab 12/16/19 2043 12/17/19 0215 12/17/19 0553  NA 138  --  137  K 4.0  --  4.3  CL 104  --  104    CO2 22  --  23  GLUCOSE 265*  --  167*  BUN 20  --  19  CREATININE 1.02 0.95 0.79  CALCIUM 9.1  --  8.7*  PROT 5.9*  --   --   ALBUMIN 3.7  --   --   AST 17  --   --   ALT 19  --   --   ALKPHOS 51  --   --   BILITOT 0.8  --   --   GFRNONAA >60 >60 >60  GFRAA >60 >60 >60  ANIONGAP 12  --  10     Hematology Recent Labs  Lab 12/17/19 0215 12/17/19 0553 12/18/19 0344  WBC 6.7 6.9 7.3  RBC 4.16* 4.13* 4.48  HGB 13.5 13.0 14.3  HCT 40.6 40.1 43.3  MCV 97.6 97.1 96.7  MCH 32.5 31.5 31.9  MCHC 33.3 32.4 33.0  RDW 13.9 14.0 13.9  PLT 217 222 237    Radiology    DG Chest Port 1 View  Result Date: 12/16/2019 CLINICAL DATA:  Chest pain EXAM: PORTABLE CHEST 1 VIEW.  Patient is rotated. COMPARISON:  Comparison chest  x-ray 12/27/2014, CT chest 08/01/2019 FINDINGS: The heart size and mediastinal contours are within normal limits. Aortic arch calcifications. Left base linear atelectasis with elevation of the left hemidiaphragm. Redemonstration of a subcentimeter calcified lesion at the left base likely representing a granuloma. Similar finding within the right upper lobe. No focal consolidation. No pulmonary edema. No pleural effusion. No pneumothorax. No acute osseous abnormality. IMPRESSION: No active cardiopulmonary disease. Electronically Signed   By: Iven Finn M.D.   On: 12/16/2019 21:20   ECHOCARDIOGRAM COMPLETE  Result Date: 12/17/2019    ECHOCARDIOGRAM REPORT   Patient Name:   [name] Date of Exam: 12/17/2019 Medical Rec #:  [id number]    Height:       69.0 in Accession #:    [id number]   Weight:       150.0 lb Date of Birth:  17-Jul-1950    BSA:          1.828 m Patient Age:    68 years     BP:           123/75 mmHg Patient Gender: M            HR:           60 bpm. Exam Location:  Inpatient Procedure: 2D Echo, Cardiac Doppler and Color Doppler Indications:    Chest Pain 786.50 / R07.9  History:        Patient has prior history of Echocardiogram examinations, most                  recent 08/23/2008. Risk Factors:Diabetes, Hypertension and                 Dyslipidemia.  Sonographer:    Alvino Chapel RCS Referring Phys: [place]  1. Left ventricular ejection fraction, by estimation, is 60 to 65%. The left ventricle has normal function. The left ventricle has no regional wall motion abnormalities. There is mild left ventricular hypertrophy. Left ventricular diastolic parameters were normal.  2. Right ventricular systolic function is normal. The right ventricular size is normal.  3. The mitral valve is normal in structure. Trivial mitral valve regurgitation. No evidence of mitral stenosis.  4. The aortic valve has an indeterminant number of cusps. There is moderate calcification of the aortic valve. There is moderate thickening of the aortic valve. Aortic valve regurgitation is not visualized. Moderate aortic valve stenosis.  5. The inferior vena cava is normal in size with greater than 50% respiratory variability, suggesting right atrial pressure of 3 mmHg. FINDINGS  Left Ventricle: Left ventricular ejection fraction, by estimation, is 60 to 65%. The left ventricle has normal function. The left ventricle has no regional wall motion abnormalities. The left ventricular internal cavity size was normal in size. There is  mild left ventricular hypertrophy. Left ventricular diastolic parameters were normal. Right Ventricle: The right ventricular size is normal. No increase in right ventricular wall thickness. Right ventricular systolic function is normal. Left Atrium: Left atrial size was normal in size. Right Atrium: Right atrial size was normal in size. Pericardium: There is no evidence of pericardial effusion. Mitral Valve: The mitral valve is normal in structure. There is mild thickening of the mitral valve leaflet(s). There is mild calcification of the mitral valve leaflet(s). Mild mitral annular calcification. Trivial mitral valve regurgitation. No evidence  of  mitral valve stenosis. Tricuspid Valve: The tricuspid valve is normal in structure. Tricuspid valve regurgitation is trivial. No evidence of tricuspid stenosis.  Aortic Valve: The aortic valve has an indeterminant number of cusps. There is moderate calcification of the aortic valve. There is moderate thickening of the aortic valve. There is moderate aortic valve annular calcification. Aortic valve regurgitation is not visualized. Moderate aortic stenosis is present. Aortic valve mean gradient measures 22.1 mmHg. Aortic valve peak gradient measures 37.3 mmHg. Aortic valve area, by VTI measures 1.11 cm. Pulmonic Valve: The pulmonic valve was not well visualized. Pulmonic valve regurgitation is not visualized. No evidence of pulmonic stenosis. Aorta: The aortic root is normal in size and structure. Pulmonary Artery: Indeterminant PASP, inadequate TR jet. Venous: The inferior vena cava is normal in size with greater than 50% respiratory variability, suggesting right atrial pressure of 3 mmHg. IAS/Shunts: No atrial level shunt detected by color flow Doppler.  LEFT VENTRICLE PLAX 2D LVIDd:         4.40 cm  Diastology LVIDs:         2.10 cm  LV e' medial:    8.27 cm/s LV PW:         1.10 cm  LV E/e' medial:  12.9 LV IVS:        1.10 cm  LV e' lateral:   9.03 cm/s LVOT diam:     1.80 cm  LV E/e' lateral: 11.8 LV SV:         66 LV SV Index:   36 LVOT Area:     2.54 cm  RIGHT VENTRICLE RV S prime:     15.60 cm/s TAPSE (M-mode): 2.8 cm LEFT ATRIUM             Index       RIGHT ATRIUM           Index LA diam:        3.00 cm 1.64 cm/m  RA Area:     18.70 cm LA Vol (A2C):   53.1 ml 29.04 ml/m RA Volume:   54.50 ml  29.81 ml/m LA Vol (A4C):   53.0 ml 28.99 ml/m LA Biplane Vol: 54.4 ml 29.76 ml/m  AORTIC VALVE AV Area (Vmax):    0.98 cm AV Area (Vmean):   0.94 cm AV Area (VTI):     1.11 cm AV Vmax:           305.23 cm/s AV Vmean:          222.681 cm/s AV VTI:            0.595 m AV Peak Grad:      37.3 mmHg AV Mean Grad:       22.1 mmHg LVOT Vmax:         118.00 cm/s LVOT Vmean:        82.600 cm/s LVOT VTI:          0.260 m LVOT/AV VTI ratio: 0.44  AORTA Ao Root diam: 3.20 cm MITRAL VALVE MV Area (PHT): 2.99 cm     SHUNTS MV Decel Time: 254 msec     Systemic VTI:  0.26 m MV E velocity: 107.00 cm/s  Systemic Diam: 1.80 cm MV A velocity: 96.80 cm/s MV E/A ratio:  1.11 Carlyle Dolly MD Electronically signed by Carlyle Dolly MD Signature Date/Time: 12/17/2019/4:04:09 PM    Final     Patient Profile     69 year old male with past medical history of diabetes mellitus, hyperlipidemia, restless leg syndrome, benign prostatic hypertrophy, tobacco abuse admitted with non-ST elevation myocardial infarction.  Echocardiogram shows normal LV function, mild left ventricular hypertrophy, moderate  aortic stenosis with mean gradient 22 mmHg.  Assessment & Plan    1 non-ST elevation myocardial infarction-patient remains pain-free.  Continue aspirin, heparin, beta-blocker and statin.  Plan is for cardiac catheterization tomorrow.  The risks and benefits were previously discussed including myocardial infarction, CVA and death and he agrees to proceed.  2 moderate aortic stenosis-noted on echocardiogram.  Would likely need aortic valve replacement if coronary artery bypass and graft necessary.  Otherwise will need follow-up echoes in the future.  3 tobacco abuse-patient previously counseled on discontinuing.  4 hyperlipidemia-continue Lipitor at 80 mg daily.  Check lipids and liver in 12 weeks.  5 diabetes mellitus-Per primary care.  For questions or updates, please contact [place] Please consult [email or web address] for contact info under        Signed, [name], MD  12/18/2019, 9:21 AM

## 2019-12-19 ENCOUNTER — Encounter (HOSPITAL_COMMUNITY)
Admission: EM | Disposition: A | Discharge status: Home / Self Care | Payer: Self-pay | Source: Home / Self Care | Attending: Thoracic Surgery (Cardiothoracic Vascular Surgery)

## 2019-12-19 ENCOUNTER — Inpatient Hospital Stay (HOSPITAL_COMMUNITY): Payer: Medicare Other

## 2019-12-19 ENCOUNTER — Other Ambulatory Visit: Payer: Self-pay | Admitting: *Deleted

## 2019-12-19 ENCOUNTER — Encounter (HOSPITAL_COMMUNITY): Payer: Self-pay | Admitting: Cardiovascular Disease

## 2019-12-19 DIAGNOSIS — I25118 Atherosclerotic heart disease of native coronary artery with other forms of angina pectoris: Secondary | ICD-10-CM

## 2019-12-19 DIAGNOSIS — I2510 Atherosclerotic heart disease of native coronary artery without angina pectoris: Secondary | ICD-10-CM

## 2019-12-19 DIAGNOSIS — Z01810 Encounter for preprocedural cardiovascular examination: Secondary | ICD-10-CM

## 2019-12-19 DIAGNOSIS — E119 Type 2 diabetes mellitus without complications: Secondary | ICD-10-CM

## 2019-12-19 DIAGNOSIS — I10 Essential (primary) hypertension: Secondary | ICD-10-CM

## 2019-12-19 DIAGNOSIS — I350 Nonrheumatic aortic (valve) stenosis: Secondary | ICD-10-CM | POA: Diagnosis not present

## 2019-12-19 DIAGNOSIS — I25110 Atherosclerotic heart disease of native coronary artery with unstable angina pectoris: Secondary | ICD-10-CM

## 2019-12-19 HISTORY — PX: LEFT HEART CATH AND CORONARY ANGIOGRAPHY: CATH118249

## 2019-12-19 LAB — GLUCOSE, CAPILLARY
Glucose-Capillary: 140 mg/dL — ABNORMAL HIGH (ref 70–99)
Glucose-Capillary: 144 mg/dL — ABNORMAL HIGH (ref 70–99)
Glucose-Capillary: 144 mg/dL — ABNORMAL HIGH (ref 70–99)
Glucose-Capillary: 146 mg/dL — ABNORMAL HIGH (ref 70–99)
Glucose-Capillary: 161 mg/dL — ABNORMAL HIGH (ref 70–99)
Glucose-Capillary: 161 mg/dL — ABNORMAL HIGH (ref 70–99)
Glucose-Capillary: 192 mg/dL — ABNORMAL HIGH (ref 70–99)
Glucose-Capillary: 219 mg/dL — ABNORMAL HIGH (ref 70–99)

## 2019-12-19 LAB — BASIC METABOLIC PANEL
Anion gap: 9 (ref 5–15)
BUN: 19 mg/dL (ref 8–23)
CO2: 22 mmol/L (ref 22–32)
Calcium: 8.9 mg/dL (ref 8.9–10.3)
Chloride: 107 mmol/L (ref 98–111)
Creatinine, Ser: 0.94 mg/dL (ref 0.61–1.24)
GFR calc Af Amer: >60 mL/min (ref >60)
GFR calc non Af Amer: >60 mL/min (ref >60)
Glucose, Bld: 135 mg/dL — ABNORMAL HIGH (ref 70–99)
Potassium: 3.7 mmol/L (ref 3.5–5.1)
Sodium: 138 mmol/L (ref 135–145)

## 2019-12-19 LAB — CBC
HCT: 42.9 % (ref 39.0–52.0)
Hemoglobin: 14.5 g/dL (ref 13.0–17.0)
MCH: 32.7 pg (ref 26.0–34.0)
MCHC: 33.8 g/dL (ref 30.0–36.0)
MCV: 96.8 fL (ref 80.0–100.0)
Platelets: 223 10*3/uL (ref 150–400)
RBC: 4.43 MIL/uL (ref 4.22–5.81)
RDW: 14 % (ref 11.5–15.5)
WBC: 7.5 10*3/uL (ref 4.0–10.5)
nRBC: 0 % (ref 0.0–0.2)

## 2019-12-19 LAB — HEPARIN LEVEL (UNFRACTIONATED)
Heparin Unfractionated: 0.49 IU/mL (ref 0.30–0.70)
Heparin Unfractionated: 0.41 IU/mL (ref 0.30–0.70)

## 2019-12-19 SURGERY — LEFT HEART CATH AND CORONARY ANGIOGRAPHY
Anesthesia: LOCAL

## 2019-12-19 MED ORDER — HEPARIN SODIUM (PORCINE) 1000 UNIT/ML IJ SOLN
INTRAMUSCULAR | Status: AC
  Filled 2019-12-19: qty 1

## 2019-12-19 MED ORDER — SODIUM CHLORIDE 0.9% FLUSH: 3.0000 mL | INTRAVENOUS | Status: DC | PRN

## 2019-12-19 MED ORDER — HEPARIN SODIUM (PORCINE) 1000 UNIT/ML IJ SOLN
INTRAMUSCULAR | Status: DC | PRN
  Administered 2019-12-19: 3500 [IU] via INTRAVENOUS

## 2019-12-19 MED ORDER — SODIUM CHLORIDE 0.9 % IV SOLN: 250.0000 mL | INTRAVENOUS | Status: DC | PRN

## 2019-12-19 MED ORDER — SODIUM CHLORIDE 0.9 % IV SOLN: INTRAVENOUS | Status: AC

## 2019-12-19 MED ORDER — NITROGLYCERIN 1 MG/10 ML FOR IR/CATH LAB
INTRA_ARTERIAL | Status: AC
  Filled 2019-12-19: qty 10

## 2019-12-19 MED ORDER — HEPARIN (PORCINE) 25000 UT/250ML-% IV SOLN
1250.0000 [IU]/h | INTRAVENOUS | Status: DC
  Administered 2019-12-19 – 2019-12-20 (×3): 1250 [IU]/h via INTRAVENOUS
  Filled 2019-12-19 (×2): qty 250

## 2019-12-19 MED ORDER — HEPARIN (PORCINE) IN NACL 1000-0.9 UT/500ML-% IV SOLN
INTRAVENOUS | Status: AC
  Filled 2019-12-19: qty 1000

## 2019-12-19 MED ORDER — LIDOCAINE HCL (PF) 1 % IJ SOLN
INTRAMUSCULAR | Status: DC | PRN
  Administered 2019-12-19: 2 mL via INTRADERMAL

## 2019-12-19 MED ORDER — ASPIRIN 81 MG PO CHEW: 81.0000 mg | CHEWABLE_TABLET | Freq: Every day | ORAL | Status: DC

## 2019-12-19 MED ORDER — HYDRALAZINE HCL 20 MG/ML IJ SOLN: 10.0000 mg | INTRAMUSCULAR | Status: AC | PRN

## 2019-12-19 MED ORDER — SODIUM CHLORIDE 0.9% FLUSH
3.0000 mL | Freq: Two times a day (BID) | INTRAVENOUS | Status: DC
  Administered 2019-12-19 – 2019-12-21 (×3): 3 mL via INTRAVENOUS

## 2019-12-19 MED ORDER — MORPHINE SULFATE (PF) 2 MG/ML IV SOLN: 2.0000 mg | INTRAVENOUS | Status: DC | PRN

## 2019-12-19 MED ORDER — VERAPAMIL HCL 2.5 MG/ML IV SOLN
INTRA_ARTERIAL | Status: DC | PRN
  Administered 2019-12-19: 5 mL via INTRA_ARTERIAL

## 2019-12-19 MED ORDER — ONDANSETRON HCL 4 MG/2ML IJ SOLN: 4.0000 mg | Freq: Four times a day (QID) | INTRAMUSCULAR | Status: DC | PRN

## 2019-12-19 MED ORDER — LABETALOL HCL 5 MG/ML IV SOLN: 10.0000 mg | INTRAVENOUS | Status: AC | PRN

## 2019-12-19 MED ORDER — ACETAMINOPHEN 325 MG PO TABS: 650.0000 mg | ORAL_TABLET | ORAL | Status: DC | PRN

## 2019-12-19 MED ORDER — VERAPAMIL HCL 2.5 MG/ML IV SOLN
INTRAVENOUS | Status: AC
  Filled 2019-12-19: qty 2

## 2019-12-19 MED ORDER — ATORVASTATIN CALCIUM 80 MG PO TABS: 80.0000 mg | ORAL_TABLET | Freq: Every day | ORAL | Status: DC

## 2019-12-19 MED ORDER — LIDOCAINE HCL (PF) 1 % IJ SOLN
INTRAMUSCULAR | Status: AC
  Filled 2019-12-19: qty 30

## 2019-12-19 MED ORDER — HEPARIN (PORCINE) IN NACL 1000-0.9 UT/500ML-% IV SOLN
INTRAVENOUS | Status: DC | PRN
  Administered 2019-12-19 (×2): 500 mL

## 2019-12-19 MED ORDER — IOHEXOL 350 MG/ML SOLN
INTRAVENOUS | Status: DC | PRN
  Administered 2019-12-19: 55 mL via INTRA_ARTERIAL

## 2019-12-19 SURGICAL SUPPLY — 11 items
CATH INFINITI JR4 5F (CATHETERS) ×1 IMPLANT
CATH OPTITORQUE TIG 4.0 5F (CATHETERS) ×1 IMPLANT
DEVICE RAD COMP TR BAND LRG (VASCULAR PRODUCTS) ×1 IMPLANT
GLIDESHEATH SLEND A-KIT 6F 22G (SHEATH) ×1 IMPLANT
GUIDEWIRE INQWIRE 1.5J.035X260 (WIRE) IMPLANT
INQWIRE 1.5J .035X260CM (WIRE) ×2
KIT HEART LEFT (KITS) ×2 IMPLANT
PACK CARDIAC CATHETERIZATION (CUSTOM PROCEDURE TRAY) ×2 IMPLANT
TRANSDUCER W/STOPCOCK (MISCELLANEOUS) ×2 IMPLANT
TUBING CIL FLEX 10 FLL-RA (TUBING) ×2 IMPLANT
WIRE HI TORQ VERSACORE-J 145CM (WIRE) ×1 IMPLANT

## 2019-12-19 NOTE — Progress Notes (Signed)
Pre-CABG testing has been completed. Preliminary results can be found in CV Proc through chart review.   12/19/19 3:11 PM Steven Lawrence RVT

## 2019-12-19 NOTE — Interval H&P Note (Signed)
Cath Lab Visit (complete for each Cath Lab visit)  Clinical Evaluation Leading to the Procedure:   ACS: Yes.    Non-ACS:    Anginal Classification: CCS II  Anti-ischemic medical therapy: No Therapy  Non-Invasive Test Results: No non-invasive testing performed  Prior CABG: No previous CABG      History and Physical Interval Note:  12/19/2019 7:40 AM  Kordel D Kinkead  has presented today for surgery, with the diagnosis of NSTEMI.  The various methods of treatment have been discussed with the patient and family. After consideration of risks, benefits and other options for treatment, the patient has consented to  Procedure(s): LEFT HEART CATH AND CORONARY ANGIOGRAPHY (N/A) as a surgical intervention.  The patient's history has been reviewed, patient examined, no change in status, stable for surgery.  I have reviewed the patient's chart and labs.  Questions were answered to the patient's satisfaction.     Quay Burow

## 2019-12-19 NOTE — Consult Note (Signed)
Reason for Consult:3 vessel CAD, moderate AS Referring Physician: Dr. Lind Guest Steven Lawrence is an 69 y.o. male.  HPI: Steven Lawrence is a 69 year old gentleman with no prior cardiac history who presented with chief complaint of chest pain.  Steven Lawrence is a 69 year old man with a history of tobacco abuse, type 2 diabetes without complication, hypertension, hyperlipidemia, hypothyroidism, heart murmur, sleep apnea, restless leg syndrome, arthritis, reflux, and depression.  On Saturday he was mowing his yard and he had developed substernal chest pain.  This was a pressure sensation that lasted about 5 minutes and resolved with rest.  He then finished mowing the yard without any issues but later in the evening he developed retrosternal chest pressure and called EMS.  On arrival EMS gave him nitroglycerin and his pain resolved.  He was brought to the emergency room.  His EKG showed some nonspecific ST changes.  He ruled in for non-ST elevation MI with a peak high-sensitivity troponin of 136.  He has not had any further chest pain since admission.  Today he underwent cardiac catheterization where he was found to have three-vessel disease with a totally occluded RCA, 95% circumflex stenosis, and 80% LAD stenosis.  He also had an echocardiogram which showed moderate aortic stenosis with a mean gradient of 22 and a peak gradient of 37 mmHg.  Aortic valve area was 1.11 cm.  Past Medical History:  Diagnosis Date  . Acid reflux   . Arthritis   . BPH (benign prostatic hyperplasia)    mild  . Depression   . Diabetes mellitus without complication ([place])    type II  . Heart murmur    with AVSC echo 2009  . History of pneumonia as a child   . Hypercholesteremia   . Hypothyroidism   . RLS (restless legs syndrome)   . Sleep apnea    does not use a cpap,primarily with upper airway resistance syndrome AHI 2.35/hr, RDI 10.2/hr ([name])  . Urinary frequency   . Wears glasses   . Wears hearing aid    both ears     Past Surgical History:  Procedure Laterality Date  . ANTERIOR LAT LUMBAR FUSION Left 01/04/2015   Procedure: ANTERIOR LATERAL LUMBAR FUSION 1 LEVEL;  Surgeon: Phylliss Bob, MD;  Location: [facility];  Service: Orthopedics;  Laterality: Left;  Left sided lateral interbody fusion lumbar 3-4 with instrumentation and allograft  . COLONOSCOPY    . ESOPHAGOGASTRODUODENOSCOPY    . INGUINAL HERNIA REPAIR Right 08/12/2012   Procedure: HERNIA REPAIR INGUINAL ADULT;  Surgeon: Imogene Burn. Georgette Dover, MD;  Location: [facility];  Service: General;  Laterality: Right;  . INSERTION OF MESH Right 08/12/2012   Procedure: INSERTION OF MESH;  Surgeon: Imogene Burn. Georgette Dover, MD;  Location: [facility];  Service: General;  Laterality: Right;  . LEFT HEART CATH AND CORONARY ANGIOGRAPHY N/A 12/19/2019   Procedure: LEFT HEART CATH AND CORONARY ANGIOGRAPHY;  Surgeon: Lorretta Harp, MD;  Location: [facility];  Service: Cardiovascular;  Laterality: N/A;  . TONSILLECTOMY    . VASECTOMY  1985    Family History  Problem Relation Age of Onset  . Diabetes Mother   . Heart disease Mother   . Parkinson's disease Mother   . Heart disease Father   . Cancer Father        melonma/Lung  . Diabetes Sister   . Diabetes Brother   . Heart disease Brother   . Multiple sclerosis Daughter  Social History:  reports that he has been smoking. He has a 53.00 pack-year smoking history. He has never used smokeless tobacco. He reports current alcohol use. He reports that he does not use drugs.  Allergies: No Known Allergies  Medications:  Scheduled: . aspirin  81 mg Oral Daily  . atorvastatin  80 mg Oral Daily  . doxazosin  2 mg Oral q morning - 10a  . insulin aspart  0-9 Units Subcutaneous Q4H  . levothyroxine  75 mcg Oral Q0600  . lisinopril  10 mg Oral Daily  . metoprolol tartrate  12.5 mg Oral BID  . pramipexole  0.125 mg Oral QHS  . sodium chloride flush  3 mL Intravenous Q12H  . sodium  chloride flush  3 mL Intravenous Q12H    Results for orders placed or performed during the hospital encounter of 12/16/19 (from the past 48 hour(s))  Glucose, capillary     Status: Abnormal   Collection Time: 12/17/19  4:19 PM  Result Value Ref Range   Glucose-Capillary 163 (H) 70 - 99 mg/dL    Comment: Glucose reference range applies only to samples taken after fasting for at least 8 hours.   Comment 1 Notify RN    Comment 2 Document in Chart   Heparin level (unfractionated)     Status: Abnormal   Collection Time: 12/17/19  6:44 PM  Result Value Ref Range   Heparin Unfractionated 0.17 (L) 0.30 - 0.70 IU/mL    Comment: (NOTE) If heparin results are below expected values, and patient dosage has  been confirmed, suggest follow up testing of antithrombin III levels. Performed at [facility], [place] [address], [place], [place] [address]   Glucose, capillary     Status: Abnormal   Collection Time: 12/17/19  8:45 PM  Result Value Ref Range   Glucose-Capillary 151 (H) 70 - 99 mg/dL    Comment: Glucose reference range applies only to samples taken after fasting for at least 8 hours.  Glucose, capillary     Status: Abnormal   Collection Time: 12/17/19 11:35 PM  Result Value Ref Range   Glucose-Capillary 105 (H) 70 - 99 mg/dL    Comment: Glucose reference range applies only to samples taken after fasting for at least 8 hours.  Glucose, capillary     Status: Abnormal   Collection Time: 12/18/19  3:25 AM  Result Value Ref Range   Glucose-Capillary 142 (H) 70 - 99 mg/dL    Comment: Glucose reference range applies only to samples taken after fasting for at least 8 hours.  CBC     Status: None   Collection Time: 12/18/19  3:44 AM  Result Value Ref Range   WBC 7.3 4.0 - 10.5 K/uL   RBC 4.48 4.22 - 5.81 MIL/uL   Hemoglobin 14.3 13.0 - 17.0 g/dL   HCT 43.3 39 - 52 %   MCV 96.7 80.0 - 100.0 fL   MCH 31.9 26.0 - 34.0 pg   MCHC 33.0 30.0 - 36.0 g/dL   RDW 13.9 11.5 - 15.5 %   Platelets  237 150 - 400 K/uL   nRBC 0.0 0.0 - 0.2 %    Comment: Performed at [facility], [place] [address] [place], [place] [address]  Heparin level (unfractionated)     Status: Abnormal   Collection Time: 12/18/19  3:44 AM  Result Value Ref Range   Heparin Unfractionated 0.18 (L) 0.30 - 0.70 IU/mL    Comment: (NOTE) If heparin  results are below expected values, and patient dosage has  been confirmed, suggest follow up testing of antithrombin III levels. Performed at [facility], [place] [address] [place], [place] [address]   Glucose, capillary     Status: Abnormal   Collection Time: 12/18/19  7:47 AM  Result Value Ref Range   Glucose-Capillary 161 (H) 70 - 99 mg/dL    Comment: Glucose reference range applies only to samples taken after fasting for at least 8 hours.   Comment 1 Notify RN   Heparin level (unfractionated)     Status: None   Collection Time: 12/18/19 10:50 AM  Result Value Ref Range   Heparin Unfractionated 0.37 0.30 - 0.70 IU/mL    Comment: (NOTE) If heparin results are below expected values, and patient dosage has  been confirmed, suggest follow up testing of antithrombin III levels. Performed at [facility], [place] [address], [place], [place] [address]   Glucose, capillary     Status: Abnormal   Collection Time: 12/18/19 11:30 AM  Result Value Ref Range   Glucose-Capillary 142 (H) 70 - 99 mg/dL    Comment: Glucose reference range applies only to samples taken after fasting for at least 8 hours.   Comment 1 Notify RN   Glucose, capillary     Status: Abnormal   Collection Time: 12/18/19  4:36 PM  Result Value Ref Range   Glucose-Capillary 154 (H) 70 - 99 mg/dL    Comment: Glucose reference range applies only to samples taken after fasting for at least 8 hours.   Comment 1 Notify RN    Comment 2 Document in Chart   Heparin level (unfractionated)     Status: None   Collection Time: 12/18/19  6:08 PM  Result Value Ref Range   Heparin Unfractionated 0.30  0.30 - 0.70 IU/mL    Comment: (NOTE) If heparin results are below expected values, and patient dosage has  been confirmed, suggest follow up testing of antithrombin III levels. Performed at [facility], [place] [address], [place], [place] [address]   Glucose, capillary     Status: Abnormal   Collection Time: 12/18/19  8:12 PM  Result Value Ref Range   Glucose-Capillary 229 (H) 70 - 99 mg/dL    Comment: Glucose reference range applies only to samples taken after fasting for at least 8 hours.  Glucose, capillary     Status: Abnormal   Collection Time: 12/19/19 12:24 AM  Result Value Ref Range   Glucose-Capillary 161 (H) 70 - 99 mg/dL    Comment: Glucose reference range applies only to samples taken after fasting for at least 8 hours.  Heparin level (unfractionated)     Status: None   Collection Time: 12/19/19  4:25 AM  Result Value Ref Range   Heparin Unfractionated 0.49 0.30 - 0.70 IU/mL    Comment: (NOTE) If heparin results are below expected values, and patient dosage has  been confirmed, suggest follow up testing of antithrombin III levels. Performed at [facility], [place] [address], [place] [address]   CBC     Status: None   Collection Time: 12/19/19  4:25 AM  Result Value Ref Range   WBC 7.5 4.0 - 10.5 K/uL   RBC 4.43 4.22 - 5.81 MIL/uL   Hemoglobin 14.5 13.0 - 17.0 g/dL   HCT 42.9 39 - 52 %   MCV 96.8 80.0 - 100.0 fL   MCH 32.7 26.0 - 34.0 pg  MCHC 33.8 30.0 - 36.0 g/dL   RDW 14.0 11.5 - 15.5 %   Platelets 223 150 - 400 K/uL   nRBC 0.0 0.0 - 0.2 %    Comment: Performed at [facility], [place] [address], [place], [place] [address]  Basic metabolic panel     Status: Abnormal   Collection Time: 12/19/19  4:25 AM  Result Value Ref Range   Sodium 138 135 - 145 mmol/L   Potassium 3.7 3.5 - 5.1 mmol/L   Chloride 107 98 - 111 mmol/L   CO2 22 22 - 32 mmol/L   Glucose, Bld 135 (H) 70 - 99 mg/dL    Comment: Glucose reference range applies only to  samples taken after fasting for at least 8 hours.   BUN 19 8 - 23 mg/dL   Creatinine, Ser 0.94 0.61 - 1.24 mg/dL   Calcium 8.9 8.9 - 10.3 mg/dL   GFR calc non Af Amer >60 >60 mL/min   GFR calc Af Amer >60 >60 mL/min   Anion gap 9 5 - 15    Comment: Performed at [place] [address], [place], [place] [address]  Glucose, capillary     Status: Abnormal   Collection Time: 12/19/19  4:25 AM  Result Value Ref Range   Glucose-Capillary 140 (H) 70 - 99 mg/dL    Comment: Glucose reference range applies only to samples taken after fasting for at least 8 hours.  Glucose, capillary     Status: Abnormal   Collection Time: 12/19/19  8:21 AM  Result Value Ref Range   Glucose-Capillary 144 (H) 70 - 99 mg/dL    Comment: Glucose reference range applies only to samples taken after fasting for at least 8 hours.  Glucose, capillary     Status: Abnormal   Collection Time: 12/19/19 11:35 AM  Result Value Ref Range   Glucose-Capillary 219 (H) 70 - 99 mg/dL    Comment: Glucose reference range applies only to samples taken after fasting for at least 8 hours.    CARDIAC CATHETERIZATION  Result Date: 12/19/2019  Ost RCA to Prox RCA lesion is 100% stenosed.  Prox Cx lesion is 95% stenosed.  Mid Cx lesion is 95% stenosed.  Mid LAD lesion is 80% stenosed.  Steven Lawrence is a 69 y.o. male  [id number] LOCATION:  FACILITY: [facility] PHYSICIAN: Quay Burow, M.D. 01-08-1951 DATE OF PROCEDURE:  12/19/2019 DATE OF DISCHARGE: CARDIAC CATHETERIZATION History obtained from chart review.69 year old male with past medical history of diabetes mellitus, hyperlipidemia, restless leg syndrome, benign prostatic hypertrophy, tobacco abuse admitted with non-ST elevation myocardial infarction.  Echocardiogram shows normal LV function, mild left ventricular hypertrophy, moderate aortic stenosis with mean gradient 22 mmHg.   Steven Lawrence has diffusely calcified vessels, three-vessel disease and preserved LV function.  He did not  have a pullback gradient across the aortic valve.  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  I believe the best therapy would be complete revascularization with CABG.  The sheath was removed and a TR band was placed on the right wrist to achieve patent hemostasis.  The patient left lab in stable condition.  Heparin will be restarted 4 hours after sheath removal.  T CTS will be notified. Quay Burow. MD, [facility] 12/19/2019 8:14 AM   ECHOCARDIOGRAM COMPLETE  Result Date: 12/17/2019    ECHOCARDIOGRAM REPORT   Patient Name:  Steven Lawrence Date of Exam: 12/17/2019 Medical Rec #:  [id number]    Height:       69.0 in Accession #:    [id number]   Weight:       150.0 lb Date of Birth:  23-Dec-1950    BSA:          1.828 m Patient Age:    32 years     BP:           123/75 mmHg Patient Gender: M            HR:           60 bpm. Exam Location:  Inpatient Procedure: 2D Echo, Cardiac Doppler and Color Doppler Indications:    Chest Pain 786.50 / R07.9  History:        Patient has prior history of Echocardiogram examinations, most                 recent 08/23/2008. Risk Factors:Diabetes, Hypertension and                 Dyslipidemia.  Sonographer:    Alvino Chapel RCS Referring Phys: [place]  1. Left ventricular ejection fraction, by estimation, is 60 to 65%. The left ventricle has normal function. The left ventricle has no regional wall motion abnormalities. There is mild left ventricular hypertrophy. Left ventricular diastolic parameters were normal.  2. Right ventricular systolic function is normal. The right ventricular size is normal.  3. The mitral valve is normal in structure. Trivial mitral valve regurgitation. No evidence of mitral stenosis.  4. The aortic valve has an indeterminant number of cusps. There is moderate calcification of the aortic valve. There is  moderate thickening of the aortic valve. Aortic valve regurgitation is not visualized. Moderate aortic valve stenosis.  5. The inferior vena cava is normal in size with greater than 50% respiratory variability, suggesting right atrial pressure of 3 mmHg. FINDINGS  Left Ventricle: Left ventricular ejection fraction, by estimation, is 60 to 65%. The left ventricle has normal function. The left ventricle has no regional wall motion abnormalities. The left ventricular internal cavity size was normal in size. There is  mild left ventricular hypertrophy. Left ventricular diastolic parameters were normal. Right Ventricle: The right ventricular size is normal. No increase in right ventricular wall thickness. Right ventricular systolic function is normal. Left Atrium: Left atrial size was normal in size. Right Atrium: Right atrial size was normal in size. Pericardium: There is no evidence of pericardial effusion. Mitral Valve: The mitral valve is normal in structure. There is mild thickening of the mitral valve leaflet(s). There is mild calcification of the mitral valve leaflet(s). Mild mitral annular calcification. Trivial mitral valve regurgitation. No evidence  of mitral valve stenosis. Tricuspid Valve: The tricuspid valve is normal in structure. Tricuspid valve regurgitation is trivial. No evidence of tricuspid stenosis. Aortic Valve: The aortic valve has an indeterminant number of cusps. There is moderate calcification of the aortic valve. There is moderate thickening of the aortic valve. There is moderate aortic valve annular calcification. Aortic valve regurgitation is not visualized. Moderate aortic stenosis is present. Aortic valve mean gradient measures 22.1 mmHg. Aortic valve peak gradient measures 37.3 mmHg. Aortic valve area, by VTI measures 1.11 cm. Pulmonic Valve: The pulmonic valve was not well visualized. Pulmonic valve regurgitation is not visualized. No evidence of pulmonic stenosis. Aorta: The aortic  root is normal in size and structure. Pulmonary Artery: Indeterminant PASP, inadequate TR  jet. Venous: The inferior vena cava is normal in size with greater than 50% respiratory variability, suggesting right atrial pressure of 3 mmHg. IAS/Shunts: No atrial level shunt detected by color flow Doppler.  LEFT VENTRICLE PLAX 2D LVIDd:         4.40 cm  Diastology LVIDs:         2.10 cm  LV e' medial:    8.27 cm/s LV PW:         1.10 cm  LV E/e' medial:  12.9 LV IVS:        1.10 cm  LV e' lateral:   9.03 cm/s LVOT diam:     1.80 cm  LV E/e' lateral: 11.8 LV SV:         66 LV SV Index:   36 LVOT Area:     2.54 cm  RIGHT VENTRICLE RV S prime:     15.60 cm/s TAPSE (M-mode): 2.8 cm LEFT ATRIUM             Index       RIGHT ATRIUM           Index LA diam:        3.00 cm 1.64 cm/m  RA Area:     18.70 cm LA Vol (A2C):   53.1 ml 29.04 ml/m RA Volume:   54.50 ml  29.81 ml/m LA Vol (A4C):   53.0 ml 28.99 ml/m LA Biplane Vol: 54.4 ml 29.76 ml/m  AORTIC VALVE AV Area (Vmax):    0.98 cm AV Area (Vmean):   0.94 cm AV Area (VTI):     1.11 cm AV Vmax:           305.23 cm/s AV Vmean:          222.681 cm/s AV VTI:            0.595 m AV Peak Grad:      37.3 mmHg AV Mean Grad:      22.1 mmHg LVOT Vmax:         118.00 cm/s LVOT Vmean:        82.600 cm/s LVOT VTI:          0.260 m LVOT/AV VTI ratio: 0.44  AORTA Ao Root diam: 3.20 cm MITRAL VALVE MV Area (PHT): 2.99 cm     SHUNTS MV Decel Time: 254 msec     Systemic VTI:  0.26 m MV E velocity: 107.00 cm/s  Systemic Diam: 1.80 cm MV A velocity: 96.80 cm/s MV E/A ratio:  1.11 Carlyle Dolly MD Electronically signed by Carlyle Dolly MD Signature Date/Time: 12/17/2019/4:04:09 PM    Final   I personally reviewed the catheterization images and concur with the findings noted above.  Review of Systems  Constitutional: Positive for fatigue. Negative for activity change and unexpected weight change.  HENT: Negative for trouble swallowing and voice change.   Respiratory: Positive for  apnea and shortness of breath.   Cardiovascular: Positive for chest pain. Negative for palpitations.  Gastrointestinal: Positive for abdominal pain (Reflux). Negative for abdominal distention.  Genitourinary: Negative for difficulty urinating and dysuria.  Musculoskeletal: Positive for arthralgias and joint swelling.  Neurological: Negative for syncope and weakness.  Hematological: Negative for adenopathy. Does not bruise/bleed easily.  All other systems reviewed and are negative.  Blood pressure 124/69, pulse (!) 59, temperature 98 F (36.7 C), temperature source Oral, resp. rate 16, height 5\' 9"  (1.753 m), weight 66.5 kg, SpO2 95 %. Physical Exam Vitals reviewed.  Constitutional:  General: He is not in acute distress.    Appearance: Normal appearance.  HENT:     Head: Normocephalic and atraumatic.  Eyes:     General: No scleral icterus.    Extraocular Movements: Extraocular movements intact.  Neck:     Vascular: Carotid bruit (Transmitted murmur bilaterally) present.  Cardiovascular:     Rate and Rhythm: Normal rate and regular rhythm.     Heart sounds: Murmur (3/6 crescendo decrescendo systolic murmur right upper sternal border) heard.   Pulmonary:     Effort: Pulmonary effort is normal. No respiratory distress.     Breath sounds: Normal breath sounds. No wheezing or rales.  Abdominal:     General: There is no distension.     Palpations: Abdomen is soft.     Tenderness: There is no abdominal tenderness.  Musculoskeletal:        General: No swelling.     Cervical back: Neck supple.  Lymphadenopathy:     Cervical: No cervical adenopathy.  Skin:    General: Skin is warm and dry.  Neurological:     General: No focal deficit present.     Mental Status: He is alert and oriented to person, place, and time.     Cranial Nerves: No cranial nerve deficit.     Motor: No weakness.     Gait: Gait normal.     Assessment/Plan: Steven Lawrence is a 69 year old gentleman with  multiple cardiac risk factors but no prior history of coronary disease.  He presented with classic anginal symptoms and ruled in for a non-STEMI with high sensitivity troponin.  He has been found to have severe three-vessel coronary disease with heavily calcified vessels and moderately severe aortic stenosis with a valve area calculated at 1.11 cm.  He has preserved left ventricular function.  Coronary bypass grafting is indicated for survival benefit and relief of symptoms.  Given the need for coronary bypass, I think aortic valve replacement is appropriate as well.  I discussed the proposed procedure of aortic valve replacement and coronary bypass grafting with Mr. Mickiewicz.  I informed him of the general nature of the procedure including the need for general anesthesia, the incisions to be used, the use of cardiopulmonary bypass, the use of drainage tubes postoperatively, the expected hospital stay, and the overall recovery.  I informed him of the indications, risks, benefits, and alternatives regarding each portion of the procedure.  He understands the risks include, but not limited to death, MI, DVT, PE, stroke, bleeding, possible need for transfusion, infection, cardiac arrhythmia, potential for heart block requiring pacemaker placement, respiratory or renal failure, as well as possibility of other unforeseeable complications.  We discussed mechanical versus tissue valves for treatment of his aortic stenosis.  At his age a tissue valve would be at the appropriate selection.  We did discuss the relative advantages and disadvantages of each.  He agrees to proceed with tissue aortic valve replacement.  Plan  AVR CABG on Wednesday, 12/21/2019.   [place] Nakayama 12/19/2019, 2:43 PM

## 2019-12-19 NOTE — Progress Notes (Signed)
ANTICOAGULATION CONSULT NOTE  Pharmacy Consult for heparin Indication: chest pain/ACS  No Known Allergies  Patient Measurements: Height: 5\' 9"  (175.3 cm) Weight: 66.5 kg (146 lb 11.2 oz) IBW/kg (Calculated) : 70.7 Heparin Dosing Weight: 68 kg   Vital Signs: BP: 125/75 (10/04 0800) Pulse Rate: 62 (10/04 0800)  Labs: Recent Labs    12/16/19 2043 12/16/19 2043 12/16/19 2240 12/17/19 0215 12/17/19 0215 12/17/19 0553 12/17/19 1844 12/18/19 0344 12/18/19 0344 12/18/19 1050 12/18/19 1808 12/19/19 0425  HGB 13.6   < >  --  13.5   < > 13.0  --  14.3  --   --   --  14.5  HCT 41.2   < >  --  40.6   < > 40.1  --  43.3  --   --   --  42.9  PLT 229   < >  --  217   < > 222  --  237  --   --   --  223  LABPROT 12.8  --   --   --   --   --   --   --   --   --   --   --   INR 1.0  --   --   --   --   --   --   --   --   --   --   --   HEPARINUNFRC  --   --   --   --   --   --    < > 0.18*   < > 0.37 0.30 0.49  CREATININE 1.02   < >  --  0.95  --  0.79  --   --   --   --   --  0.94  TROPONINIHS 13   < > 59* 108*  --  136*  --   --   --   --   --   --    < > = values in this interval not displayed.    Estimated Creatinine Clearance: 69.8 mL/min (by C-G formula based on SCr of 0.94 mg/dL).   Medical History: Past Medical History:  Diagnosis Date  . Acid reflux   . Arthritis   . BPH (benign prostatic hyperplasia)    mild  . Depression   . Diabetes mellitus without complication ([place])    type II  . Heart murmur    with AVSC echo 2009  . History of pneumonia as a child   . Hypercholesteremia   . Hypothyroidism   . RLS (restless legs syndrome)   . Sleep apnea    does not use a cpap,primarily with upper airway resistance syndrome AHI 2.35/hr, RDI 10.2/hr ([name])  . Urinary frequency   . Wears glasses   . Wears hearing aid    both ears    Medications:  Medications Prior to Admission  Medication Sig Dispense Refill Last Dose  . aspirin 81 MG tablet Take 81 mg by mouth  daily.   12/16/2019 at Unknown time  . atorvastatin (LIPITOR) 20 MG tablet Take 20 mg by mouth daily.    12/16/2019 at Unknown time  . canagliflozin (INVOKANA) 100 MG TABS tablet Take 100 mg by mouth daily.   12/16/2019 at Unknown time  . cholestyramine light (PREVALITE) 4 GM/DOSE powder Take 1 g by mouth daily as needed (For stomach).   12/15/2019 at Unknown time  . doxazosin (CARDURA) 2 MG tablet Take 2 mg by mouth  every morning. Takes for urinary freq   12/16/2019 at Unknown time  . LEVEMIR FLEXTOUCH 100 UNIT/ML FlexPen Inject 20 Units into the skin at bedtime.   12/15/2019 at Unknown time  . levothyroxine (SYNTHROID, LEVOTHROID) 50 MCG tablet Take 50 mcg by mouth daily before breakfast.   12/16/2019 at Unknown time  . lisinopril (PRINIVIL,ZESTRIL) 10 MG tablet Take 10 mg by mouth daily.   12/16/2019 at Unknown time  . metFORMIN (GLUCOPHAGE) 1000 MG tablet Take 1,000 mg by mouth 2 (two) times daily with a meal.   12/16/2019 at Unknown time  . ONGLYZA 5 MG TABS tablet Take 5 mg by mouth.    12/16/2019 at Unknown time  . rOPINIRole (REQUIP) 1 MG tablet Take 2 mg by mouth at bedtime.   12/16/2019 at Unknown time    Assessment: 5 YOM who presents with chest heaviness and slight shortness of breath. Troponins elevated concerning for ACS. Pharmacy consulted to doseIV heparin.   He is now s/p cath and noted with 3VCAD (also with moderate AS) and for CABG consult. Heparin to restart 4 hours post sheath removal (removed ~ 8am)  Goal of Therapy:  Heparin level 0.3-0.7 units/ml Monitor platelets by anticoagulation protocol: Yes   Plan: -Restart heparin at 1250 units/hr at 12:30pm -Heparin level in 6 hours and daily wth CBC daily  Hildred Laser, PharmD Clinical Pharmacist **Pharmacist phone directory can now be found on [email or web address] ([email or web address]).  Listed under [place].

## 2019-12-19 NOTE — Progress Notes (Signed)
[facility] consulted for CABG evaluation. °

## 2019-12-19 NOTE — Progress Notes (Signed)
ANTICOAGULATION CONSULT NOTE  Pharmacy Consult for heparin Indication: chest pain/ACS  No Known Allergies  Patient Measurements: Height: 5\' 9"  (175.3 cm) Weight: 66.5 kg (146 lb 11.2 oz) IBW/kg (Calculated) : 70.7 Heparin Dosing Weight: 68 kg   Vital Signs: Temp: 98.7 F (37.1 C) (10/04 1938) Temp Source: Oral (10/04 1938) BP: 138/57 (10/04 1938) Pulse Rate: 68 (10/04 1938)  Labs: Recent Labs    12/16/19 2043 12/16/19 2043 12/16/19 2240 12/17/19 0215 12/17/19 0215 12/17/19 0553 12/17/19 1844 12/18/19 0344 12/18/19 1050 12/18/19 1808 12/19/19 0425 12/19/19 1921  HGB 13.6   < >  --  13.5   < > 13.0  --  14.3  --   --  14.5  --   HCT 41.2   < >  --  40.6   < > 40.1  --  43.3  --   --  42.9  --   PLT 229   < >  --  217   < > 222  --  237  --   --  223  --   LABPROT 12.8  --   --   --   --   --   --   --   --   --   --   --   INR 1.0  --   --   --   --   --   --   --   --   --   --   --   HEPARINUNFRC  --   --   --   --   --   --    < > 0.18*   < > 0.30 0.49 0.41  CREATININE 1.02   < >  --  0.95  --  0.79  --   --   --   --  0.94  --   TROPONINIHS 13   < > 59* 108*  --  136*  --   --   --   --   --   --    < > = values in this interval not displayed.    Estimated Creatinine Clearance: 69.8 mL/min (by C-G formula based on SCr of 0.94 mg/dL).   Medical History: Past Medical History:  Diagnosis Date  . Acid reflux   . Arthritis   . BPH (benign prostatic hyperplasia)    mild  . Depression   . Diabetes mellitus without complication ([place])    type II  . Heart murmur    with AVSC echo 2009  . History of pneumonia as a child   . Hypercholesteremia   . Hypothyroidism   . RLS (restless legs syndrome)   . Sleep apnea    does not use a cpap,primarily with upper airway resistance syndrome AHI 2.35/hr, RDI 10.2/hr ([name])  . Urinary frequency   . Wears glasses   . Wears hearing aid    both ears    Medications:  Medications Prior to Admission  Medication Sig  Dispense Refill Last Dose  . aspirin 81 MG tablet Take 81 mg by mouth daily.   12/16/2019 at Unknown time  . atorvastatin (LIPITOR) 20 MG tablet Take 20 mg by mouth daily.    12/16/2019 at Unknown time  . canagliflozin (INVOKANA) 100 MG TABS tablet Take 100 mg by mouth daily.   12/16/2019 at Unknown time  . cholestyramine light (PREVALITE) 4 GM/DOSE powder Take 1 g by mouth daily as needed (For stomach).   12/15/2019 at Unknown time  .  doxazosin (CARDURA) 2 MG tablet Take 2 mg by mouth every morning. Takes for urinary freq   12/16/2019 at Unknown time  . LEVEMIR FLEXTOUCH 100 UNIT/ML FlexPen Inject 20 Units into the skin at bedtime.   12/15/2019 at Unknown time  . levothyroxine (SYNTHROID, LEVOTHROID) 50 MCG tablet Take 50 mcg by mouth daily before breakfast.   12/16/2019 at Unknown time  . lisinopril (PRINIVIL,ZESTRIL) 10 MG tablet Take 10 mg by mouth daily.   12/16/2019 at Unknown time  . metFORMIN (GLUCOPHAGE) 1000 MG tablet Take 1,000 mg by mouth 2 (two) times daily with a meal.   12/16/2019 at Unknown time  . ONGLYZA 5 MG TABS tablet Take 5 mg by mouth.    12/16/2019 at Unknown time  . rOPINIRole (REQUIP) 1 MG tablet Take 2 mg by mouth at bedtime.   12/16/2019 at Unknown time    Assessment: 46 YOM who presents with chest heaviness and slight shortness of breath. Troponins elevated concerning for ACS. Pharmacy consulted to doseIV heparin.   He is now s/p cath and noted with 3VCAD (also with moderate AS) and for CABG consult. Heparin to restart 4 hours post sheath removal (removed ~ 8am). Initial heparin level therapeutic.  Goal of Therapy:  Heparin level 0.3-0.7 units/ml Monitor platelets by anticoagulation protocol: Yes   Plan: -Continue heparin at 1250 units/hr -Daily heparin level and CBC   Arrie Senate, PharmD, BCPS Clinical Pharmacist [phone] Please check AMION for all [place] numbers 12/19/2019

## 2019-12-19 NOTE — H&P (View-Only) (Signed)
Reason for Consult:3 vessel CAD, moderate AS Referring Physician: Dr. Lind Guest Steven Lawrence is an 69 y.o. male.  HPI: Steven Lawrence is a 69 year old gentleman with no prior cardiac history who presented with chief complaint of chest pain.  Steven Lawrence is a 69 year old man with a history of tobacco abuse, type 2 diabetes without complication, hypertension, hyperlipidemia, hypothyroidism, heart murmur, sleep apnea, restless leg syndrome, arthritis, reflux, and depression.  On Saturday he was mowing his yard and he had developed substernal chest pain.  This was a pressure sensation that lasted about 5 minutes and resolved with rest.  He then finished mowing the yard without any issues but later in the evening he developed retrosternal chest pressure and called EMS.  On arrival EMS gave him nitroglycerin and his pain resolved.  He was brought to the emergency room.  His EKG showed some nonspecific ST changes.  He ruled in for non-ST elevation MI with a peak high-sensitivity troponin of 136.  He has not had any further chest pain since admission.  Today he underwent cardiac catheterization where he was found to have three-vessel disease with a totally occluded RCA, 95% circumflex stenosis, and 80% LAD stenosis.  He also had an echocardiogram which showed moderate aortic stenosis with a mean gradient of 22 and a peak gradient of 37 mmHg.  Aortic valve area was 1.11 cm.  Past Medical History:  Diagnosis Date  . Acid reflux   . Arthritis   . BPH (benign prostatic hyperplasia)    mild  . Depression   . Diabetes mellitus without complication ([place])    type II  . Heart murmur    with AVSC echo 2009  . History of pneumonia as a child   . Hypercholesteremia   . Hypothyroidism   . RLS (restless legs syndrome)   . Sleep apnea    does not use a cpap,primarily with upper airway resistance syndrome AHI 2.35/hr, RDI 10.2/hr ([name])  . Urinary frequency   . Wears glasses   . Wears hearing aid    both ears     Past Surgical History:  Procedure Laterality Date  . ANTERIOR LAT LUMBAR FUSION Left 01/04/2015   Procedure: ANTERIOR LATERAL LUMBAR FUSION 1 LEVEL;  Surgeon: Phylliss Bob, MD;  Location: [facility];  Service: Orthopedics;  Laterality: Left;  Left sided lateral interbody fusion lumbar 3-4 with instrumentation and allograft  . COLONOSCOPY    . ESOPHAGOGASTRODUODENOSCOPY    . INGUINAL HERNIA REPAIR Right 08/12/2012   Procedure: HERNIA REPAIR INGUINAL ADULT;  Surgeon: Imogene Burn. Georgette Dover, MD;  Location: [facility];  Service: General;  Laterality: Right;  . INSERTION OF MESH Right 08/12/2012   Procedure: INSERTION OF MESH;  Surgeon: Imogene Burn. Georgette Dover, MD;  Location: [facility];  Service: General;  Laterality: Right;  . LEFT HEART CATH AND CORONARY ANGIOGRAPHY N/A 12/19/2019   Procedure: LEFT HEART CATH AND CORONARY ANGIOGRAPHY;  Surgeon: Lorretta Harp, MD;  Location: [facility];  Service: Cardiovascular;  Laterality: N/A;  . TONSILLECTOMY    . VASECTOMY  1985    Family History  Problem Relation Age of Onset  . Diabetes Mother   . Heart disease Mother   . Parkinson's disease Mother   . Heart disease Father   . Cancer Father        melonma/Lung  . Diabetes Sister   . Diabetes Brother   . Heart disease Brother   . Multiple sclerosis Daughter  Social History:  reports that he has been smoking. He has a 53.00 pack-year smoking history. He has never used smokeless tobacco. He reports current alcohol use. He reports that he does not use drugs.  Allergies: No Known Allergies  Medications:  Scheduled: . aspirin  81 mg Oral Daily  . atorvastatin  80 mg Oral Daily  . doxazosin  2 mg Oral q morning - 10a  . insulin aspart  0-9 Units Subcutaneous Q4H  . levothyroxine  75 mcg Oral Q0600  . lisinopril  10 mg Oral Daily  . metoprolol tartrate  12.5 mg Oral BID  . pramipexole  0.125 mg Oral QHS  . sodium chloride flush  3 mL Intravenous Q12H  . sodium  chloride flush  3 mL Intravenous Q12H    Results for orders placed or performed during the hospital encounter of 12/16/19 (from the past 48 hour(s))  Glucose, capillary     Status: Abnormal   Collection Time: 12/17/19  4:19 PM  Result Value Ref Range   Glucose-Capillary 163 (H) 70 - 99 mg/dL    Comment: Glucose reference range applies only to samples taken after fasting for at least 8 hours.   Comment 1 Notify RN    Comment 2 Document in Chart   Heparin level (unfractionated)     Status: Abnormal   Collection Time: 12/17/19  6:44 PM  Result Value Ref Range   Heparin Unfractionated 0.17 (L) 0.30 - 0.70 IU/mL    Comment: (NOTE) If heparin results are below expected values, and patient dosage has  been confirmed, suggest follow up testing of antithrombin III levels. Performed at [facility], [place] [address], [place], [place] [address]   Glucose, capillary     Status: Abnormal   Collection Time: 12/17/19  8:45 PM  Result Value Ref Range   Glucose-Capillary 151 (H) 70 - 99 mg/dL    Comment: Glucose reference range applies only to samples taken after fasting for at least 8 hours.  Glucose, capillary     Status: Abnormal   Collection Time: 12/17/19 11:35 PM  Result Value Ref Range   Glucose-Capillary 105 (H) 70 - 99 mg/dL    Comment: Glucose reference range applies only to samples taken after fasting for at least 8 hours.  Glucose, capillary     Status: Abnormal   Collection Time: 12/18/19  3:25 AM  Result Value Ref Range   Glucose-Capillary 142 (H) 70 - 99 mg/dL    Comment: Glucose reference range applies only to samples taken after fasting for at least 8 hours.  CBC     Status: None   Collection Time: 12/18/19  3:44 AM  Result Value Ref Range   WBC 7.3 4.0 - 10.5 K/uL   RBC 4.48 4.22 - 5.81 MIL/uL   Hemoglobin 14.3 13.0 - 17.0 g/dL   HCT 43.3 39 - 52 %   MCV 96.7 80.0 - 100.0 fL   MCH 31.9 26.0 - 34.0 pg   MCHC 33.0 30.0 - 36.0 g/dL   RDW 13.9 11.5 - 15.5 %   Platelets  237 150 - 400 K/uL   nRBC 0.0 0.0 - 0.2 %    Comment: Performed at [facility], [place] [address], [place], [place] [address]  Heparin level (unfractionated)     Status: Abnormal   Collection Time: 12/18/19  3:44 AM  Result Value Ref Range   Heparin Unfractionated 0.18 (L) 0.30 - 0.70 IU/mL    Comment: (NOTE) If heparin  results are below expected values, and patient dosage has  been confirmed, suggest follow up testing of antithrombin III levels. Performed at [facility], [place] [address] [place], [place] [address]   Glucose, capillary     Status: Abnormal   Collection Time: 12/18/19  7:47 AM  Result Value Ref Range   Glucose-Capillary 161 (H) 70 - 99 mg/dL    Comment: Glucose reference range applies only to samples taken after fasting for at least 8 hours.   Comment 1 Notify RN   Heparin level (unfractionated)     Status: None   Collection Time: 12/18/19 10:50 AM  Result Value Ref Range   Heparin Unfractionated 0.37 0.30 - 0.70 IU/mL    Comment: (NOTE) If heparin results are below expected values, and patient dosage has  been confirmed, suggest follow up testing of antithrombin III levels. Performed at [facility], [place] [address], [place], [place] [address]   Glucose, capillary     Status: Abnormal   Collection Time: 12/18/19 11:30 AM  Result Value Ref Range   Glucose-Capillary 142 (H) 70 - 99 mg/dL    Comment: Glucose reference range applies only to samples taken after fasting for at least 8 hours.   Comment 1 Notify RN   Glucose, capillary     Status: Abnormal   Collection Time: 12/18/19  4:36 PM  Result Value Ref Range   Glucose-Capillary 154 (H) 70 - 99 mg/dL    Comment: Glucose reference range applies only to samples taken after fasting for at least 8 hours.   Comment 1 Notify RN    Comment 2 Document in Chart   Heparin level (unfractionated)     Status: None   Collection Time: 12/18/19  6:08 PM  Result Value Ref Range   Heparin Unfractionated 0.30  0.30 - 0.70 IU/mL    Comment: (NOTE) If heparin results are below expected values, and patient dosage has  been confirmed, suggest follow up testing of antithrombin III levels. Performed at [facility], [place] [address] [place], [place] [address]   Glucose, capillary     Status: Abnormal   Collection Time: 12/18/19  8:12 PM  Result Value Ref Range   Glucose-Capillary 229 (H) 70 - 99 mg/dL    Comment: Glucose reference range applies only to samples taken after fasting for at least 8 hours.  Glucose, capillary     Status: Abnormal   Collection Time: 12/19/19 12:24 AM  Result Value Ref Range   Glucose-Capillary 161 (H) 70 - 99 mg/dL    Comment: Glucose reference range applies only to samples taken after fasting for at least 8 hours.  Heparin level (unfractionated)     Status: None   Collection Time: 12/19/19  4:25 AM  Result Value Ref Range   Heparin Unfractionated 0.49 0.30 - 0.70 IU/mL    Comment: (NOTE) If heparin results are below expected values, and patient dosage has  been confirmed, suggest follow up testing of antithrombin III levels. Performed at [facility], [place] [address], [place] [address]   CBC     Status: None   Collection Time: 12/19/19  4:25 AM  Result Value Ref Range   WBC 7.5 4.0 - 10.5 K/uL   RBC 4.43 4.22 - 5.81 MIL/uL   Hemoglobin 14.5 13.0 - 17.0 g/dL   HCT 42.9 39 - 52 %   MCV 96.8 80.0 - 100.0 fL   MCH 32.7 26.0 - 34.0 pg  MCHC 33.8 30.0 - 36.0 g/dL   RDW 14.0 11.5 - 15.5 %   Platelets 223 150 - 400 K/uL   nRBC 0.0 0.0 - 0.2 %    Comment: Performed at [facility], [place] [address] [place], [place] [address]  Basic metabolic panel     Status: Abnormal   Collection Time: 12/19/19  4:25 AM  Result Value Ref Range   Sodium 138 135 - 145 mmol/L   Potassium 3.7 3.5 - 5.1 mmol/L   Chloride 107 98 - 111 mmol/L   CO2 22 22 - 32 mmol/L   Glucose, Bld 135 (H) 70 - 99 mg/dL    Comment: Glucose reference range applies only to  samples taken after fasting for at least 8 hours.   BUN 19 8 - 23 mg/dL   Creatinine, Ser 0.94 0.61 - 1.24 mg/dL   Calcium 8.9 8.9 - 10.3 mg/dL   GFR calc non Af Amer >60 >60 mL/min   GFR calc Af Amer >60 >60 mL/min   Anion gap 9 5 - 15    Comment: Performed at [facility] [address], [place], [place] [address]  Glucose, capillary     Status: Abnormal   Collection Time: 12/19/19  4:25 AM  Result Value Ref Range   Glucose-Capillary 140 (H) 70 - 99 mg/dL    Comment: Glucose reference range applies only to samples taken after fasting for at least 8 hours.  Glucose, capillary     Status: Abnormal   Collection Time: 12/19/19  8:21 AM  Result Value Ref Range   Glucose-Capillary 144 (H) 70 - 99 mg/dL    Comment: Glucose reference range applies only to samples taken after fasting for at least 8 hours.  Glucose, capillary     Status: Abnormal   Collection Time: 12/19/19 11:35 AM  Result Value Ref Range   Glucose-Capillary 219 (H) 70 - 99 mg/dL    Comment: Glucose reference range applies only to samples taken after fasting for at least 8 hours.    CARDIAC CATHETERIZATION  Result Date: 12/19/2019  Ost RCA to Prox RCA lesion is 100% stenosed.  Prox Cx lesion is 95% stenosed.  Mid Cx lesion is 95% stenosed.  Mid LAD lesion is 80% stenosed.  Steven Lawrence is a 69 y.o. male  [id number] LOCATION:  FACILITY: [facility] PHYSICIAN: Quay Burow, M.D. 12-07-1950 DATE OF PROCEDURE:  12/19/2019 DATE OF DISCHARGE: CARDIAC CATHETERIZATION History obtained from chart review.69 year old male with past medical history of diabetes mellitus, hyperlipidemia, restless leg syndrome, benign prostatic hypertrophy, tobacco abuse admitted with non-ST elevation myocardial infarction.  Echocardiogram shows normal LV function, mild left ventricular hypertrophy, moderate aortic stenosis with mean gradient 22 mmHg.   Mr. Niemczyk has diffusely calcified vessels, three-vessel disease and preserved LV function.  He did not  have a pullback gradient across the aortic valve.  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  I believe the best therapy would be complete revascularization with CABG.  The sheath was removed and a TR band was placed on the right wrist to achieve patent hemostasis.  The patient left lab in stable condition.  Heparin will be restarted 4 hours after sheath removal.  T CTS will be notified. Quay Burow. MD, [facility] 12/19/2019 8:14 AM   ECHOCARDIOGRAM COMPLETE  Result Date: 12/17/2019    ECHOCARDIOGRAM REPORT   Patient Name:  Steven Lawrence Date of Exam: 12/17/2019 Medical Rec #:  [id number]    Height:       69.0 in Accession #:    [id number]   Weight:       150.0 lb Date of Birth:  [birth year]    BSA:          1.828 m Patient Age:    41 years     BP:           123/75 mmHg Patient Gender: M            HR:           60 bpm. Exam Location:  Inpatient Procedure: 2D Echo, Cardiac Doppler and Color Doppler Indications:    Chest Pain 786.50 / R07.9  History:        Patient has prior history of Echocardiogram examinations, most                 recent 08/23/2008. Risk Factors:Diabetes, Hypertension and                 Dyslipidemia.  Sonographer:    Alvino Chapel RCS Referring Phys: Ray  1. Left ventricular ejection fraction, by estimation, is 60 to 65%. The left ventricle has normal function. The left ventricle has no regional wall motion abnormalities. There is mild left ventricular hypertrophy. Left ventricular diastolic parameters were normal.  2. Right ventricular systolic function is normal. The right ventricular size is normal.  3. The mitral valve is normal in structure. Trivial mitral valve regurgitation. No evidence of mitral stenosis.  4. The aortic valve has an indeterminant number of cusps. There is moderate calcification of the aortic valve. There is  moderate thickening of the aortic valve. Aortic valve regurgitation is not visualized. Moderate aortic valve stenosis.  5. The inferior vena cava is normal in size with greater than 50% respiratory variability, suggesting right atrial pressure of 3 mmHg. FINDINGS  Left Ventricle: Left ventricular ejection fraction, by estimation, is 60 to 65%. The left ventricle has normal function. The left ventricle has no regional wall motion abnormalities. The left ventricular internal cavity size was normal in size. There is  mild left ventricular hypertrophy. Left ventricular diastolic parameters were normal. Right Ventricle: The right ventricular size is normal. No increase in right ventricular wall thickness. Right ventricular systolic function is normal. Left Atrium: Left atrial size was normal in size. Right Atrium: Right atrial size was normal in size. Pericardium: There is no evidence of pericardial effusion. Mitral Valve: The mitral valve is normal in structure. There is mild thickening of the mitral valve leaflet(s). There is mild calcification of the mitral valve leaflet(s). Mild mitral annular calcification. Trivial mitral valve regurgitation. No evidence  of mitral valve stenosis. Tricuspid Valve: The tricuspid valve is normal in structure. Tricuspid valve regurgitation is trivial. No evidence of tricuspid stenosis. Aortic Valve: The aortic valve has an indeterminant number of cusps. There is moderate calcification of the aortic valve. There is moderate thickening of the aortic valve. There is moderate aortic valve annular calcification. Aortic valve regurgitation is not visualized. Moderate aortic stenosis is present. Aortic valve mean gradient measures 22.1 mmHg. Aortic valve peak gradient measures 37.3 mmHg. Aortic valve area, by VTI measures 1.11 cm. Pulmonic Valve: The pulmonic valve was not well visualized. Pulmonic valve regurgitation is not visualized. No evidence of pulmonic stenosis. Aorta: The aortic  root is normal in size and structure. Pulmonary Artery: Indeterminant PASP, inadequate TR  jet. Venous: The inferior vena cava is normal in size with greater than 50% respiratory variability, suggesting right atrial pressure of 3 mmHg. IAS/Shunts: No atrial level shunt detected by color flow Doppler.  LEFT VENTRICLE PLAX 2D LVIDd:         4.40 cm  Diastology LVIDs:         2.10 cm  LV e' medial:    8.27 cm/s LV PW:         1.10 cm  LV E/e' medial:  12.9 LV IVS:        1.10 cm  LV e' lateral:   9.03 cm/s LVOT diam:     1.80 cm  LV E/e' lateral: 11.8 LV SV:         66 LV SV Index:   36 LVOT Area:     2.54 cm  RIGHT VENTRICLE RV S prime:     15.60 cm/s TAPSE (M-mode): 2.8 cm LEFT ATRIUM             Index       RIGHT ATRIUM           Index LA diam:        3.00 cm 1.64 cm/m  RA Area:     18.70 cm LA Vol (A2C):   53.1 ml 29.04 ml/m RA Volume:   54.50 ml  29.81 ml/m LA Vol (A4C):   53.0 ml 28.99 ml/m LA Biplane Vol: 54.4 ml 29.76 ml/m  AORTIC VALVE AV Area (Vmax):    0.98 cm AV Area (Vmean):   0.94 cm AV Area (VTI):     1.11 cm AV Vmax:           305.23 cm/s AV Vmean:          222.681 cm/s AV VTI:            0.595 m AV Peak Grad:      37.3 mmHg AV Mean Grad:      22.1 mmHg LVOT Vmax:         118.00 cm/s LVOT Vmean:        82.600 cm/s LVOT VTI:          0.260 m LVOT/AV VTI ratio: 0.44  AORTA Ao Root diam: 3.20 cm MITRAL VALVE MV Area (PHT): 2.99 cm     SHUNTS MV Decel Time: 254 msec     Systemic VTI:  0.26 m MV E velocity: 107.00 cm/s  Systemic Diam: 1.80 cm MV A velocity: 96.80 cm/s MV E/A ratio:  1.11 Steven Dolly MD Electronically signed by Steven Dolly MD Signature Date/Time: 12/17/2019/4:04:09 PM    Final   I personally reviewed the catheterization images and concur with the findings noted above.  Review of Systems  Constitutional: Positive for fatigue. Negative for activity change and unexpected weight change.  HENT: Negative for trouble swallowing and voice change.   Respiratory: Positive for  apnea and shortness of breath.   Cardiovascular: Positive for chest pain. Negative for palpitations.  Gastrointestinal: Positive for abdominal pain (Reflux). Negative for abdominal distention.  Genitourinary: Negative for difficulty urinating and dysuria.  Musculoskeletal: Positive for arthralgias and joint swelling.  Neurological: Negative for syncope and weakness.  Hematological: Negative for adenopathy. Does not bruise/bleed easily.  All other systems reviewed and are negative.  Blood pressure 124/69, pulse (!) 59, temperature 98 F (36.7 C), temperature source Oral, resp. rate 16, height 5\' 9"  (1.753 m), weight 66.5 kg, SpO2 95 %. Physical Exam Vitals reviewed.  Constitutional:  General: He is not in acute distress.    Appearance: Normal appearance.  HENT:     Head: Normocephalic and atraumatic.  Eyes:     General: No scleral icterus.    Extraocular Movements: Extraocular movements intact.  Neck:     Vascular: Carotid bruit (Transmitted murmur bilaterally) present.  Cardiovascular:     Rate and Rhythm: Normal rate and regular rhythm.     Heart sounds: Murmur (3/6 crescendo decrescendo systolic murmur right upper sternal border) heard.   Pulmonary:     Effort: Pulmonary effort is normal. No respiratory distress.     Breath sounds: Normal breath sounds. No wheezing or rales.  Abdominal:     General: There is no distension.     Palpations: Abdomen is soft.     Tenderness: There is no abdominal tenderness.  Musculoskeletal:        General: No swelling.     Cervical back: Neck supple.  Lymphadenopathy:     Cervical: No cervical adenopathy.  Skin:    General: Skin is warm and dry.  Neurological:     General: No focal deficit present.     Mental Status: He is alert and oriented to person, place, and time.     Cranial Nerves: No cranial nerve deficit.     Motor: No weakness.     Gait: Gait normal.     Assessment/Plan: Steven Lawrence is a 69 year old gentleman with  multiple cardiac risk factors but no prior history of coronary disease.  He presented with classic anginal symptoms and ruled in for a non-STEMI with high sensitivity troponin.  He has been found to have severe three-vessel coronary disease with heavily calcified vessels and moderately severe aortic stenosis with a valve area calculated at 1.11 cm.  He has preserved left ventricular function.  Coronary bypass grafting is indicated for survival benefit and relief of symptoms.  Given the need for coronary bypass, I think aortic valve replacement is appropriate as well.  I discussed the proposed procedure of aortic valve replacement and coronary bypass grafting with Steven Lawrence.  I informed him of the general nature of the procedure including the need for general anesthesia, the incisions to be used, the use of cardiopulmonary bypass, the use of drainage tubes postoperatively, the expected hospital stay, and the overall recovery.  I informed him of the indications, risks, benefits, and alternatives regarding each portion of the procedure.  He understands the risks include, but not limited to death, MI, DVT, PE, stroke, bleeding, possible need for transfusion, infection, cardiac arrhythmia, potential for heart block requiring pacemaker placement, respiratory or renal failure, as well as possibility of other unforeseeable complications.  We discussed mechanical versus tissue valves for treatment of his aortic stenosis.  At his age a tissue valve would be at the appropriate selection.  We did discuss the relative advantages and disadvantages of each.  He agrees to proceed with tissue aortic valve replacement.  Plan  AVR CABG on Wednesday, 12/21/2019.   Steven Lawrence 12/19/2019, 2:43 PM

## 2019-12-19 NOTE — Progress Notes (Signed)
PROGRESS NOTE    DEEP BONAWITZ  LOV:[id number] DOB: September 20, 1950 DOA: 12/16/2019 PCP: Lennie Odor, PA  Outpatient Specialists:   Brief Narrative:  Patient is a 69 year old male past medical history significant for tobacco use/abuse, diabetes mellitus type 2, hypertension, hypothyroidism and restless leg syndrome.  Patient was admitted with chest pain and elevated troponin.  Cardiology team has been consulted.  Cardiology is directing care.  Patient is not on heparin drip.  For likely cardiac catheterization on Monday, 12/19/2019.  No chest pain reported at the moment.    12/18/2019: Patient seen.  No further chest pain reported.  For cardiac catheterization tomorrow. Echocardiogram reviewed: 1. Left ventricular ejection fraction, by estimation, is 60 to 65%. The  left ventricle has normal function. The left ventricle has no regional  wall motion abnormalities. There is mild left ventricular hypertrophy.  Left ventricular diastolic parameters  were normal.  2. Right ventricular systolic function is normal. The right ventricular  size is normal.  3. The mitral valve is normal in structure. Trivial mitral valve  regurgitation. No evidence of mitral stenosis.  4. The aortic valve has an indeterminant number of cusps. There is  moderate calcification of the aortic valve. There is moderate thickening  of the aortic valve. Aortic valve regurgitation is not visualized.  Moderate aortic valve stenosis.  5. The inferior vena cava is normal in size with greater than 50%  respiratory variability, suggesting right atrial pressure of 3 mmHg.   12/19/2019: Patient underwent cardiac catheterization today.  Cardiac catheter revealed three-vessel disease.  Cardiothoracic team has been consulted.Marland Kitchen  CABG vascular work-up has been completed.  Plan is to proceed with CABG and aortic valve replacement.    Assessment & Plan:   Principal Problem:   Chest pain Active Problems:   Obstructive sleep apnea of  adult   DM (diabetes mellitus) (HCC)   Restless legs syndrome (RLS)   Essential hypertension   Hypothyroidism   NSTEMI (non-ST elevated myocardial infarction) ([place])  NSTEMI: -Chest pain has resolved.   -Cardiology input is appreciated.   -Heparin drip as per cardiology team. -Cardiac catheterization revealed three-vessel disease.   -Plan is for CABG and aortic valve replacement.   -Cardiothoracic team has been consulted.   -Pre-CABG vascular work-up has been completed.  Moderate aortic stenosis: -For CABG and aortic valve replacement.    Diabetes mellitus:  -Continue sliding scale insulin coverage.   -Continue to monitor closely.  Hypothyroidism: -Continue Synthroid 75 MCG p.o. once daily.  Hyperlipidemia: -Continue Lipitor 80 mg p.o. once daily.  Tobacco abuse: -Counseled.  DVT prophylaxis: IV heparin drip Code Status: Full code Family Communication:  Disposition Plan: This will depend on hospital course.   Consultants:   Cardiology  Procedures:   Cardiac catheterization is planned for on 12/19/2019.  Echocardiogram.  Antimicrobials:   None   Subjective: No chest pain. No shortness of breath.  Objective: Vitals:   12/19/19 0750 12/19/19 0755 12/19/19 0800 12/19/19 1136  BP: 129/75 118/76 125/75 124/69  Pulse: 62 64 62 (!) 59  Resp: 12 14 10 16   Temp:    98 F (36.7 C)  TempSrc:    Oral  SpO2: 100% 98% 99% 95%  Weight:      Height:        Intake/Output Summary (Last 24 hours) at 12/19/2019 1809 Last data filed at 12/19/2019 0813 Gross per 24 hour  Intake 0 ml  Output 900 ml  Net -900 ml   Filed Weights   12/16/19 2046  12/18/19 0328 12/19/19 0049  Weight: 68 kg 66.5 kg 66.5 kg    Examination:  General exam: Appears calm and comfortable  Respiratory system: Clear to auscultation.  Cardiovascular system: S1 & S2, with ejection systolic murmur.  Gastrointestinal system: Abdomen is nondistended, soft and nontender. No organomegaly or  masses felt. Normal bowel sounds heard. Central nervous system: Alert and oriented. No focal neurological deficits. Extremities: No leg edema.  Data Reviewed: I have personally reviewed following labs and imaging studies  CBC: Recent Labs  Lab 12/16/19 2043 12/17/19 0215 12/17/19 0553 12/18/19 0344 12/19/19 0425  WBC 7.0 6.7 6.9 7.3 7.5  NEUTROABS 3.7  --   --   --   --   HGB 13.6 13.5 13.0 14.3 14.5  HCT 41.2 40.6 40.1 43.3 42.9  MCV 96.0 97.6 97.1 96.7 96.8  PLT 229 217 222 237 213   Basic Metabolic Panel: Recent Labs  Lab 12/16/19 2043 12/17/19 0215 12/17/19 0553 12/19/19 0425  NA 138  --  137 138  K 4.0  --  4.3 3.7  CL 104  --  104 107  CO2 22  --  23 22  GLUCOSE 265*  --  167* 135*  BUN 20  --  19 19  CREATININE 1.02 0.95 0.79 0.94  CALCIUM 9.1  --  8.7* 8.9   GFR: Estimated Creatinine Clearance: 69.8 mL/min (by C-G formula based on SCr of 0.94 mg/dL). Liver Function Tests: Recent Labs  Lab 12/16/19 2043  AST 17  ALT 19  ALKPHOS 51  BILITOT 0.8  PROT 5.9*  ALBUMIN 3.7   Recent Labs  Lab 12/16/19 2043  LIPASE 48   No results for input(s): AMMONIA in the last 168 hours. Coagulation Profile: Recent Labs  Lab 12/16/19 2043  INR 1.0   Cardiac Enzymes: No results for input(s): CKTOTAL, CKMB, CKMBINDEX, TROPONINI in the last 168 hours. BNP (last 3 results) No results for input(s): PROBNP in the last 8760 hours. HbA1C: Recent Labs    12/17/19 1318  HGBA1C 7.1*   CBG: Recent Labs  Lab 12/19/19 0024 12/19/19 0425 12/19/19 0821 12/19/19 1135 12/19/19 1606  GLUCAP 161* 140* 144* 219* 144*   Lipid Profile: No results for input(s): CHOL, HDL, LDLCALC, TRIG, CHOLHDL, LDLDIRECT in the last 72 hours. Thyroid Function Tests: No results for input(s): TSH, T4TOTAL, FREET4, T3FREE, THYROIDAB in the last 72 hours. Anemia Panel: No results for input(s): VITAMINB12, FOLATE, FERRITIN, TIBC, IRON, RETICCTPCT in the last 72 hours. Urine analysis:      Component Value Date/Time   COLORURINE YELLOW 12/27/2014 [place] 12/27/2014 1438   LABSPEC 1.019 12/27/2014 1438   PHURINE 5.0 12/27/2014 1438   GLUCOSEU >1000 (A) 12/27/2014 1438   HGBUR NEGATIVE 12/27/2014 1438   [place] 12/27/2014 1438   KETONESUR NEGATIVE 12/27/2014 1438   PROTEINUR NEGATIVE 12/27/2014 1438   UROBILINOGEN 0.2 12/27/2014 1438   NITRITE NEGATIVE 12/27/2014 1438   LEUKOCYTESUR NEGATIVE 12/27/2014 1438   Sepsis Labs: @LABRCNTIP (procalcitonin:4,lacticidven:4)  ) Recent Results (from the past 240 hour(s))  Respiratory Panel by RT PCR (Flu A&B, Covid) - Nasopharyngeal Swab     Status: None   Collection Time: 12/16/19 10:26 PM   Specimen: Nasopharyngeal Swab  Result Value Ref Range Status   SARS Coronavirus 2 by RT PCR NEGATIVE NEGATIVE Final    Comment: (NOTE) SARS-CoV-2 target nucleic acids are NOT DETECTED.  The SARS-CoV-2 RNA is generally detectable in upper respiratoy specimens during the acute phase of infection. The  lowest concentration of SARS-CoV-2 viral copies this assay can detect is 131 copies/mL. A negative result does not preclude SARS-Cov-2 infection and should not be used as the sole basis for treatment or other patient management decisions. A negative result may occur with  improper specimen collection/handling, submission of specimen other than nasopharyngeal swab, presence of viral mutation(s) within the areas targeted by this assay, and inadequate number of viral copies (<131 copies/mL). A negative result must be combined with clinical observations, patient history, and epidemiological information. The expected result is Negative.  Fact Sheet for Patients:  PinkCheek.be  Fact Sheet for Healthcare Providers:  GravelBags.it  This test is no t yet approved or cleared by the [place] FDA and  has been authorized for detection and/or diagnosis of  SARS-CoV-2 by FDA under an Emergency Use Authorization (EUA). This EUA will remain  in effect (meaning this test can be used) for the duration of the COVID-19 declaration under Section 564(b)(1) of the Act, 21 U.S.C. section 360bbb-3(b)(1), unless the authorization is terminated or revoked sooner.     Influenza A by PCR NEGATIVE NEGATIVE Final   Influenza B by PCR NEGATIVE NEGATIVE Final    Comment: (NOTE) The Xpert Xpress SARS-CoV-2/FLU/RSV assay is intended as an aid in  the diagnosis of influenza from Nasopharyngeal swab specimens and  should not be used as a sole basis for treatment. Nasal washings and  aspirates are unacceptable for Xpert Xpress SARS-CoV-2/FLU/RSV  testing.  Fact Sheet for Patients: PinkCheek.be  Fact Sheet for Healthcare Providers: GravelBags.it  This test is not yet approved or cleared by the [place] FDA and  has been authorized for detection and/or diagnosis of SARS-CoV-2 by  FDA under an Emergency Use Authorization (EUA). This EUA will remain  in effect (meaning this test can be used) for the duration of the  Covid-19 declaration under Section 564(b)(1) of the Act, 21  U.S.C. section 360bbb-3(b)(1), unless the authorization is  terminated or revoked. Performed at [facility], [place] [address] [place], [place] [address]          Radiology Studies: CARDIAC CATHETERIZATION  Result Date: 12/19/2019  Ost RCA to Prox RCA lesion is 100% stenosed.  Prox Cx lesion is 95% stenosed.  Mid Cx lesion is 95% stenosed.  Mid LAD lesion is 80% stenosed.  JAHMIRE RUFFINS is a 69 y.o. male  [id number] LOCATION:  FACILITY: [facility] PHYSICIAN: Quay Burow, M.D. 1950-06-18 DATE OF PROCEDURE:  12/19/2019 DATE OF DISCHARGE: CARDIAC CATHETERIZATION History obtained from chart review.69 year old male with past medical history of diabetes mellitus, hyperlipidemia, restless leg syndrome, benign prostatic  hypertrophy, tobacco abuse admitted with non-ST elevation myocardial infarction.  Echocardiogram shows normal LV function, mild left ventricular hypertrophy, moderate aortic stenosis with mean gradient 22 mmHg.   Mr. Reiland has diffusely calcified vessels, three-vessel disease and preserved LV function.  He did not have a pullback gradient across the aortic valve.  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  I believe the best therapy would be complete revascularization with CABG.  The sheath was removed and a TR band was placed on the right wrist to achieve patent hemostasis.  The patient left lab in stable condition.  Heparin will be restarted 4 hours after sheath removal.  T CTS will be notified. Quay Burow. MD, [facility] 12/19/2019 8:14 AM   VAS US DOPPLER PRE CABG  Result  Date: 12/19/2019 PREOPERATIVE VASCULAR EVALUATION  Indications:      Pre-CABG. Risk Factors:     None. Comparison Study: No prior studies. Performing Technologist: Carlos Levering RVT  Examination Guidelines: A complete evaluation includes B-mode imaging, spectral Doppler, color Doppler, and power Doppler as needed of all accessible portions of each vessel. Bilateral testing is considered an integral part of a complete examination. Limited examinations for reoccurring indications may be performed as noted.  Right Carotid Findings: +----------+--------+--------+--------+-----------------------+--------+           PSV cm/sEDV cm/sStenosisDescribe               Comments +----------+--------+--------+--------+-----------------------+--------+ CCA Prox  91      18              smooth and heterogenous         +----------+--------+--------+--------+-----------------------+--------+ CCA Distal76      15              smooth and heterogenous          +----------+--------+--------+--------+-----------------------+--------+ ICA Prox  71      16              calcific                        +----------+--------+--------+--------+-----------------------+--------+ ICA Distal108     12              smooth and heterogenous         +----------+--------+--------+--------+-----------------------+--------+ ECA       152     16                                              +----------+--------+--------+--------+-----------------------+--------+ Portions of this table do not appear on this page. +----------+--------+-------+--------+------------+           PSV cm/sEDV cmsDescribeArm Pressure +----------+--------+-------+--------+------------+ Subclavian118                                 +----------+--------+-------+--------+------------+ +---------+--------+--+--------+-+ VertebralPSV cm/s46EDV cm/s6 +---------+--------+--+--------+-+ Left Carotid Findings: +----------+--------+--------+--------+-----------------------+--------+           PSV cm/sEDV cm/sStenosisDescribe               Comments +----------+--------+--------+--------+-----------------------+--------+ CCA Prox  108     16              smooth and heterogenous         +----------+--------+--------+--------+-----------------------+--------+ CCA Distal83      16              smooth and heterogenous         +----------+--------+--------+--------+-----------------------+--------+ ICA Prox  67      18              calcific                        +----------+--------+--------+--------+-----------------------+--------+ ICA Distal64      21                                              +----------+--------+--------+--------+-----------------------+--------+ ECA       100     10                                              +----------+--------+--------+--------+-----------------------+--------+  +----------+--------+--------+--------+------------+  SubclavianPSV cm/sEDV cm/sDescribeArm Pressure +----------+--------+--------+--------+------------+           129                                  +----------+--------+--------+--------+------------+ +---------+--------+--+--------+--+---------+ VertebralPSV cm/s50EDV cm/s15Antegrade +---------+--------+--+--------+--+---------+  ABI Findings: +--------+------------------+-----+---------+--------+ Right   Rt Pressure (mmHg)IndexWaveform Comment  +--------+------------------+-----+---------+--------+ [id number]                    triphasic         +--------+------------------+-----+---------+--------+ PTA     137               1.20 biphasic          +--------+------------------+-----+---------+--------+ DP      161               1.41 triphasic         +--------+------------------+-----+---------+--------+ +--------+------------------+-----+---------+-------+ Left    Lt Pressure (mmHg)IndexWaveform Comment +--------+------------------+-----+---------+-------+ [id number]                    triphasic        +--------+------------------+-----+---------+-------+ PTA     139               1.22 biphasic         +--------+------------------+-----+---------+-------+ DP      140               1.23 triphasic        +--------+------------------+-----+---------+-------+  Right Doppler Findings: +--------+--------+-----+---------+--------+ Site    PressureIndexDoppler  Comments +--------+--------+-----+---------+--------+ [id number]          triphasic         +--------+--------+-----+---------+--------+ Radial               triphasic         +--------+--------+-----+---------+--------+ Ulnar                triphasic         +--------+--------+-----+---------+--------+  Left Doppler Findings: +--------+--------+-----+---------+--------+ Site    PressureIndexDoppler  Comments  +--------+--------+-----+---------+--------+ [id number]          triphasic         +--------+--------+-----+---------+--------+ Radial               triphasic         +--------+--------+-----+---------+--------+ Ulnar                triphasic         +--------+--------+-----+---------+--------+  Summary: Right Carotid: Velocities in the right ICA are consistent with a 1-39% stenosis. Left Carotid: Velocities in the left ICA are consistent with a 1-39% stenosis. Vertebrals: Bilateral vertebral arteries demonstrate antegrade flow. Right ABI: Resting right ankle-brachial index indicates noncompressible right lower extremity arteries. Left ABI: Resting left ankle-brachial index is within normal range. No evidence of significant left lower extremity arterial disease. Right Upper Extremity: Doppler waveforms remain within normal limits with right radial compression. Doppler waveform obliterate with right ulnar compression. Left Upper Extremity: Doppler waveforms decrease <50% w left radial compression. Doppler waveforms decrease >50% with left ulnar compression.  Electronically signed by Ruta Hinds MD on 12/19/2019 at 4:53:58 PM.    Final         Scheduled Meds: . aspirin  81 mg Oral Daily  . atorvastatin  80 mg Oral Daily  . doxazosin  2 mg Oral q morning - 10a  . insulin aspart  0-9 Units Subcutaneous Q4H  .  levothyroxine  75 mcg Oral Q0600  . lisinopril  10 mg Oral Daily  . metoprolol tartrate  12.5 mg Oral BID  . pramipexole  0.125 mg Oral QHS  . sodium chloride flush  3 mL Intravenous Q12H  . sodium chloride flush  3 mL Intravenous Q12H   Continuous Infusions: . sodium chloride    . heparin 1,250 Units/hr (12/19/19 1239)     LOS: 2 days    Time spent: 25 minutes    Dana Allan, MD  [facility] Pager #: [phone] 7PM-7AM contact night coverage as above

## 2019-12-19 NOTE — Progress Notes (Signed)
Progress Note  Patient Name: AARIT KASHUBA Date of Encounter: 12/19/2019  [facility] Cardiologist: Fransico Him, MD   Subjective   No acute overnight events. No more chest pain or dyspnea.  Inpatient Medications    Scheduled Meds:  aspirin  81 mg Oral Daily   aspirin  81 mg Oral Pre-Cath   atorvastatin  80 mg Oral Daily   doxazosin  2 mg Oral q morning - 10a   insulin aspart  0-9 Units Subcutaneous Q4H   levothyroxine  75 mcg Oral Q0600   lisinopril  10 mg Oral Daily   metoprolol tartrate  12.5 mg Oral BID   pramipexole  0.125 mg Oral QHS   sodium chloride flush  3 mL Intravenous Q12H   Continuous Infusions:  sodium chloride     sodium chloride 1 mL/kg/hr (12/19/19 0506)   heparin 1,250 Units/hr (12/19/19 0236)   PRN Meds: sodium chloride, acetaminophen **OR** acetaminophen, nitroGLYCERIN, ondansetron **OR** ondansetron (ZOFRAN) IV, sodium chloride flush   Vital Signs    Vitals:   12/18/19 0829 12/18/19 1129 12/18/19 2016 12/19/19 0049  BP:  109/70 125/72   Pulse: 81 (!) 57 65   Resp:  16 18   Temp:  98.8 F (37.1 C) 98.8 F (37.1 C)   TempSrc:  Oral Oral   SpO2:  97% 98%   Weight:    66.5 kg  Height:    5\' 9"  (1.753 m)    Intake/Output Summary (Last 24 hours) at 12/19/2019 0638 Last data filed at 12/18/2019 1840 Gross per 24 hour  Intake 240 ml  Output 1800 ml  Net -1560 ml   Last 3 Weights 12/19/2019 12/18/2019 12/16/2019  Weight (lbs) 146 lb 11.2 oz 146 lb 11.2 oz 150 lb  Weight (kg) 66.543 kg 66.543 kg 68.04 kg      Telemetry    Sinus rhythm-bradycardia- Personally Reviewed  ECG    Sinus bradycardia, rate 56 bpm, with early repolarization in anterior leads but no acute ischemic changes. - Personally Reviewed  Physical Exam   Patient seen in the Cath Lab GEN: No acute distress.  Able to lie flat with no issues Neck: No JVD Cardiac: RRR with normal S1 and S2., 3/6 SEM at RUSB.  No rubs, or gallops.  Respiratory: Clear to  auscultation bilaterally. GI: Soft, nontender, non-distended  MS: No edema; No deformity. Neuro:  Nonfocal  Psych: Normal affect   Labs    High Sensitivity Troponin:   Recent Labs  Lab 12/16/19 2043 12/16/19 2240 12/17/19 0215 12/17/19 0553  TROPONINIHS 13 59* 108* 136*      Chemistry Recent Labs  Lab 12/16/19 2043 12/16/19 2043 12/17/19 0215 12/17/19 0553 12/19/19 0425  NA 138  --   --  137 138  K 4.0  --   --  4.3 3.7  CL 104  --   --  104 107  CO2 22  --   --  23 22  GLUCOSE 265*  --   --  167* 135*  BUN 20  --   --  19 19  CREATININE 1.02   < > 0.95 0.79 0.94  CALCIUM 9.1  --   --  8.7* 8.9  PROT 5.9*  --   --   --   --   ALBUMIN 3.7  --   --   --   --   AST 17  --   --   --   --   ALT 19  --   --   --   --  ALKPHOS 51  --   --   --   --   BILITOT 0.8  --   --   --   --   GFRNONAA >60   < > >60 >60 >60  GFRAA >60   < > >60 >60 >60  ANIONGAP 12  --   --  10 9   < > = values in this interval not displayed.     Hematology Recent Labs  Lab 12/17/19 0553 12/18/19 0344 12/19/19 0425  WBC 6.9 7.3 7.5  RBC 4.13* 4.48 4.43  HGB 13.0 14.3 14.5  HCT 40.1 43.3 42.9  MCV 97.1 96.7 96.8  MCH 31.5 31.9 32.7  MCHC 32.4 33.0 33.8  RDW 14.0 13.9 14.0  PLT 222 237 223    BNPNo results for input(s): BNP, PROBNP in the last 168 hours.   DDimer No results for input(s): DDIMER in the last 168 hours.   Radiology    ECHOCARDIOGRAM COMPLETE  Result Date: 12/17/2019    ECHOCARDIOGRAM REPORT   Patient Name:   TEDFORD BERG Date of Exam: 12/17/2019 Medical Rec #:  [id number]    Height:       69.0 in Accession #:    [id number]   Weight:       150.0 lb Date of Birth:  04-02-1950    BSA:          1.828 m Patient Age:    69 years     BP:           123/75 mmHg Patient Gender: M            HR:           60 bpm. Exam Location:  Inpatient Procedure: 2D Echo, Cardiac Doppler and Color Doppler Indications:    Chest Pain 786.50 / R07.9  History:        Patient has prior history of  Echocardiogram examinations, most                 recent 08/23/2008. Risk Factors:Diabetes, Hypertension and                 Dyslipidemia.  Sonographer:    Alvino Chapel RCS Referring Phys: [place]  1. Left ventricular ejection fraction, by estimation, is 60 to 65%. The left ventricle has normal function. The left ventricle has no regional wall motion abnormalities. There is mild left ventricular hypertrophy. Left ventricular diastolic parameters were normal.  2. Right ventricular systolic function is normal. The right ventricular size is normal.  3. The mitral valve is normal in structure. Trivial mitral valve regurgitation. No evidence of mitral stenosis.  4. The aortic valve has an indeterminant number of cusps. There is moderate calcification of the aortic valve. There is moderate thickening of the aortic valve. Aortic valve regurgitation is not visualized. Moderate aortic valve stenosis.  5. The inferior vena cava is normal in size with greater than 50% respiratory variability, suggesting right atrial pressure of 3 mmHg. FINDINGS  Left Ventricle: Left ventricular ejection fraction, by estimation, is 60 to 65%. The left ventricle has normal function. The left ventricle has no regional wall motion abnormalities. The left ventricular internal cavity size was normal in size. There is  mild left ventricular hypertrophy. Left ventricular diastolic parameters were normal. Right Ventricle: The right ventricular size is normal. No increase in right ventricular wall thickness. Right ventricular systolic function is normal. Left Atrium: Left atrial size was normal in size. Right  Atrium: Right atrial size was normal in size. Pericardium: There is no evidence of pericardial effusion. Mitral Valve: The mitral valve is normal in structure. There is mild thickening of the mitral valve leaflet(s). There is mild calcification of the mitral valve leaflet(s). Mild mitral annular calcification. Trivial  mitral valve regurgitation. No evidence  of mitral valve stenosis. Tricuspid Valve: The tricuspid valve is normal in structure. Tricuspid valve regurgitation is trivial. No evidence of tricuspid stenosis. Aortic Valve: The aortic valve has an indeterminant number of cusps. There is moderate calcification of the aortic valve. There is moderate thickening of the aortic valve. There is moderate aortic valve annular calcification. Aortic valve regurgitation is not visualized. Moderate aortic stenosis is present. Aortic valve mean gradient measures 22.1 mmHg. Aortic valve peak gradient measures 37.3 mmHg. Aortic valve area, by VTI measures 1.11 cm. Pulmonic Valve: The pulmonic valve was not well visualized. Pulmonic valve regurgitation is not visualized. No evidence of pulmonic stenosis. Aorta: The aortic root is normal in size and structure. Pulmonary Artery: Indeterminant PASP, inadequate TR jet. Venous: The inferior vena cava is normal in size with greater than 50% respiratory variability, suggesting right atrial pressure of 3 mmHg. IAS/Shunts: No atrial level shunt detected by color flow Doppler.  LEFT VENTRICLE PLAX 2D LVIDd:         4.40 cm  Diastology LVIDs:         2.10 cm  LV e' medial:    8.27 cm/s LV PW:         1.10 cm  LV E/e' medial:  12.9 LV IVS:        1.10 cm  LV e' lateral:   9.03 cm/s LVOT diam:     1.80 cm  LV E/e' lateral: 11.8 LV SV:         66 LV SV Index:   36 LVOT Area:     2.54 cm  RIGHT VENTRICLE RV S prime:     15.60 cm/s TAPSE (M-mode): 2.8 cm LEFT ATRIUM             Index       RIGHT ATRIUM           Index LA diam:        3.00 cm 1.64 cm/m  RA Area:     18.70 cm LA Vol (A2C):   53.1 ml 29.04 ml/m RA Volume:   54.50 ml  29.81 ml/m LA Vol (A4C):   53.0 ml 28.99 ml/m LA Biplane Vol: 54.4 ml 29.76 ml/m  AORTIC VALVE AV Area (Vmax):    0.98 cm AV Area (Vmean):   0.94 cm AV Area (VTI):     1.11 cm AV Vmax:           305.23 cm/s AV Vmean:          222.681 cm/s AV VTI:            0.595 m  AV Peak Grad:      37.3 mmHg AV Mean Grad:      22.1 mmHg LVOT Vmax:         118.00 cm/s LVOT Vmean:        82.600 cm/s LVOT VTI:          0.260 m LVOT/AV VTI ratio: 0.44  AORTA Ao Root diam: 3.20 cm MITRAL VALVE MV Area (PHT): 2.99 cm     SHUNTS MV Decel Time: 254 msec     Systemic VTI:  0.26 m MV E velocity: 107.00  cm/s  Systemic Diam: 1.80 cm MV A velocity: 96.80 cm/s MV E/A ratio:  1.11 Carlyle Dolly MD Electronically signed by Carlyle Dolly MD Signature Date/Time: 12/17/2019/4:04:09 PM    Final     Cardiac Studies    Echocardiogram 12/17/2019: LVEF 60 to 65%.  No R WMA.  Trivial MR.  Moderate aortic valve calcification with moderate aortic valve stenosis.  Normal RAP  Preliminary cath report 12/19/2019: Occluded RCA with left-to-right collaterals; diffuse moderate to severe mid LAD stenosis with severe proximal and mid LCx disease (just after AV groove branch that gives rise to L-R collaterals) --> MV CAD with moderate AS, recommend CABG (potential CABG AVR)   Patient Profile     69 y.o. male with a history of hyperlipidemia, diabetes mellitus, tobacco abuse, restless leg syndrome, BPH, and depression who was admitted with NSTEMI on 12/16/2019 after presenting with chest pain.  Assessment & Plan    NSTEMI  High sensitivity troponin 13 >> 59 >> 108 >> 136.   EKG shows what looks like early repolarization changes but no acute ischemic changes.   Echo showed LVEF of 60-65% with no wall motion abnormalities and moderate AS.  Restart IV heparin post cath 8 hours after sheath removal.  Continue aspirin, beta-blocker, and high-intensity statin => blood pressures have been relatively stable as of heart rates.  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  Moderate Aortic Stenosis  Noted on Echo.   With plan for CABG, defer decision on timing of AVR to  CVTS.  Hyperlipidemia  On Lipitor 20mg  daily at home but this was increased to 80mg  daily this admission. Continue higher dose.   Will check fasting lipid panel tomorrow.  Will also need repeat lipid panel and LFTs checked 6-8 weeks after increasing statin.  Diabetes Mellitus  Hemoglobin A1c 7.1 this admission.  Management per primary team [facility])  Essential Hypertension  Was on lisinopril at home, continue the 10 mg along with new addition of metoprolol 12.5 mg twice daily.  For questions or updates, please contact [place] Please consult [email or web address] for contact info under        Signed, Darreld Mclean, PA-C  12/19/2019, 6:38 AM    ATTENDING ATTESTATION  I have seen, examined and evaluated the patient this morning in the Cath Lab along with Sande Rives, PA.  After reviewing all the available data and chart, we discussed the patients laboratory, study & physical findings as well as symptoms in detail. I agree with her findings, examination as well as impression recommendations as per our discussion.    Attending adjustments noted in italics.   Patient presented non-STEMI.  Preserved EF on echo along with moderate AS.  Cardiac catheterization multivessel CAD.    Plan for now will be to restart IV heparin, continue current medications (potentially titrate beta-blocker)  CV consultation for adamant on CABG but likely CABG-AVR.    Glenetta Hew, M.D., M.S. Interventional Cardiologist   Pager # [phone] Phone # [phone] [address]. [place], [place] [address]

## 2019-12-20 ENCOUNTER — Encounter (HOSPITAL_COMMUNITY): Payer: Medicare Other

## 2019-12-20 DIAGNOSIS — G2581 Restless legs syndrome: Secondary | ICD-10-CM

## 2019-12-20 DIAGNOSIS — I214 Non-ST elevation (NSTEMI) myocardial infarction: Secondary | ICD-10-CM

## 2019-12-20 DIAGNOSIS — E118 Type 2 diabetes mellitus with unspecified complications: Secondary | ICD-10-CM

## 2019-12-20 DIAGNOSIS — I200 Unstable angina: Secondary | ICD-10-CM

## 2019-12-20 DIAGNOSIS — I25110 Atherosclerotic heart disease of native coronary artery with unstable angina pectoris: Secondary | ICD-10-CM

## 2019-12-20 DIAGNOSIS — E1169 Type 2 diabetes mellitus with other specified complication: Secondary | ICD-10-CM

## 2019-12-20 DIAGNOSIS — G4733 Obstructive sleep apnea (adult) (pediatric): Secondary | ICD-10-CM

## 2019-12-20 DIAGNOSIS — I350 Nonrheumatic aortic (valve) stenosis: Secondary | ICD-10-CM

## 2019-12-20 LAB — GLUCOSE, CAPILLARY
Glucose-Capillary: 135 mg/dL — ABNORMAL HIGH (ref 70–99)
Glucose-Capillary: 165 mg/dL — ABNORMAL HIGH (ref 70–99)
Glucose-Capillary: 175 mg/dL — ABNORMAL HIGH (ref 70–99)
Glucose-Capillary: 185 mg/dL — ABNORMAL HIGH (ref 70–99)
Glucose-Capillary: 188 mg/dL — ABNORMAL HIGH (ref 70–99)
Glucose-Capillary: 191 mg/dL — ABNORMAL HIGH (ref 70–99)
Glucose-Capillary: 229 mg/dL — ABNORMAL HIGH (ref 70–99)

## 2019-12-20 LAB — LIPID PANEL
Cholesterol: 124 mg/dL (ref 0–200)
HDL: 49 mg/dL (ref >40)
LDL Cholesterol: 66 mg/dL (ref 0–99)
Total CHOL/HDL Ratio: 2.5 RATIO
Triglycerides: 47 mg/dL (ref <150)
VLDL: 9 mg/dL (ref 0–40)

## 2019-12-20 LAB — PROTIME-INR
INR: 1.1 (ref 0.8–1.2)
Prothrombin Time: 13.3 seconds (ref 11.4–15.2)

## 2019-12-20 LAB — CBC
HCT: 43.7 % (ref 39.0–52.0)
Hemoglobin: 14.4 g/dL (ref 13.0–17.0)
MCH: 31.6 pg (ref 26.0–34.0)
MCHC: 33 g/dL (ref 30.0–36.0)
MCV: 96 fL (ref 80.0–100.0)
Platelets: 224 10*3/uL (ref 150–400)
RBC: 4.55 MIL/uL (ref 4.22–5.81)
RDW: 13.8 % (ref 11.5–15.5)
WBC: 6.5 10*3/uL (ref 4.0–10.5)
nRBC: 0 % (ref 0.0–0.2)

## 2019-12-20 LAB — BASIC METABOLIC PANEL
Anion gap: 10 (ref 5–15)
BUN: 14 mg/dL (ref 8–23)
CO2: 22 mmol/L (ref 22–32)
Calcium: 9.1 mg/dL (ref 8.9–10.3)
Chloride: 105 mmol/L (ref 98–111)
Creatinine, Ser: 0.93 mg/dL (ref 0.61–1.24)
GFR calc Af Amer: >60 mL/min (ref >60)
GFR calc non Af Amer: >60 mL/min (ref >60)
Glucose, Bld: 124 mg/dL — ABNORMAL HIGH (ref 70–99)
Potassium: 3.7 mmol/L (ref 3.5–5.1)
Sodium: 137 mmol/L (ref 135–145)

## 2019-12-20 LAB — URINALYSIS, ROUTINE W REFLEX MICROSCOPIC
Bacteria, UA: NONE SEEN
Bilirubin Urine: NEGATIVE
Glucose, UA: >=500 mg/dL — AB
Hgb urine dipstick: NEGATIVE
Ketones, ur: NEGATIVE mg/dL
Leukocytes,Ua: NEGATIVE
Nitrite: NEGATIVE
Protein, ur: NEGATIVE mg/dL
Specific Gravity, Urine: 1.01 (ref 1.005–1.030)
pH: 6 (ref 5.0–8.0)

## 2019-12-20 LAB — HEPARIN LEVEL (UNFRACTIONATED): Heparin Unfractionated: 0.49 IU/mL (ref 0.30–0.70)

## 2019-12-20 LAB — SURGICAL PCR SCREEN
MRSA, PCR: NEGATIVE
Staphylococcus aureus: NEGATIVE

## 2019-12-20 LAB — TYPE AND SCREEN
ABO/RH(D): O POS
Antibody Screen: NEGATIVE

## 2019-12-20 MED ORDER — MILRINONE LACTATE IN DEXTROSE 20-5 MG/100ML-% IV SOLN
0.3000 ug/kg/min | INTRAVENOUS | Status: DC
  Filled 2019-12-20: qty 100

## 2019-12-20 MED ORDER — MAGNESIUM SULFATE 50 % IJ SOLN
40.0000 meq | INTRAMUSCULAR | Status: DC
  Filled 2019-12-20: qty 9.85

## 2019-12-20 MED ORDER — VANCOMYCIN HCL 1250 MG/250ML IV SOLN
1250.0000 mg | INTRAVENOUS | Status: AC
  Administered 2019-12-21: 1250 mg via INTRAVENOUS
  Filled 2019-12-20: qty 250

## 2019-12-20 MED ORDER — BISACODYL 5 MG PO TBEC
5.0000 mg | DELAYED_RELEASE_TABLET | Freq: Once | ORAL | Status: AC
  Administered 2019-12-20: 5 mg via ORAL
  Filled 2019-12-20: qty 1

## 2019-12-20 MED ORDER — PHENYLEPHRINE HCL-NACL 20-0.9 MG/250ML-% IV SOLN
30.0000 ug/min | INTRAVENOUS | Status: AC
  Administered 2019-12-21: 30 ug/min via INTRAVENOUS
  Filled 2019-12-20: qty 250

## 2019-12-20 MED ORDER — SODIUM CHLORIDE 0.9 % IV SOLN
750.0000 mg | INTRAVENOUS | Status: AC
  Administered 2019-12-21: 750 mg via INTRAVENOUS
  Filled 2019-12-20: qty 750

## 2019-12-20 MED ORDER — TEMAZEPAM 15 MG PO CAPS
15.0000 mg | ORAL_CAPSULE | Freq: Once | ORAL | Status: AC | PRN
  Administered 2019-12-20: 15 mg via ORAL
  Filled 2019-12-20: qty 1

## 2019-12-20 MED ORDER — CHLORHEXIDINE GLUCONATE 0.12 % MT SOLN
15.0000 mL | Freq: Once | OROMUCOSAL | Status: AC
  Administered 2019-12-21: 15 mL via OROMUCOSAL
  Filled 2019-12-20: qty 15

## 2019-12-20 MED ORDER — SODIUM CHLORIDE 0.9 % IV SOLN
1.5000 g | INTRAVENOUS | Status: AC
  Administered 2019-12-21: 1.5 g via INTRAVENOUS
  Filled 2019-12-20: qty 1.5

## 2019-12-20 MED ORDER — DIAZEPAM 2 MG PO TABS
2.0000 mg | ORAL_TABLET | Freq: Once | ORAL | Status: AC
  Administered 2019-12-21: 2 mg via ORAL
  Filled 2019-12-20: qty 1

## 2019-12-20 MED ORDER — CHLORHEXIDINE GLUCONATE CLOTH 2 % EX PADS
6.0000 | MEDICATED_PAD | Freq: Once | CUTANEOUS | Status: AC
  Administered 2019-12-20: 6 via TOPICAL

## 2019-12-20 MED ORDER — TRANEXAMIC ACID 1000 MG/10ML IV SOLN
1.5000 mg/kg/h | INTRAVENOUS | Status: AC
  Administered 2019-12-21: 1.5 mg/kg/h via INTRAVENOUS
  Filled 2019-12-20: qty 25

## 2019-12-20 MED ORDER — METOPROLOL TARTRATE 12.5 MG HALF TABLET
12.5000 mg | ORAL_TABLET | Freq: Once | ORAL | Status: DC
  Filled 2019-12-20: qty 1

## 2019-12-20 MED ORDER — ACETAMINOPHEN 500 MG PO TABS
1000.0000 mg | ORAL_TABLET | Freq: Once | ORAL | Status: AC
  Administered 2019-12-20: 1000 mg via ORAL
  Filled 2019-12-20: qty 2

## 2019-12-20 MED ORDER — EPINEPHRINE HCL 5 MG/250ML IV SOLN IN NS
0.0000 ug/min | INTRAVENOUS | Status: DC
  Filled 2019-12-20: qty 250

## 2019-12-20 MED ORDER — NOREPINEPHRINE 4 MG/250ML-% IV SOLN
0.0000 ug/min | INTRAVENOUS | Status: DC
  Filled 2019-12-20: qty 250

## 2019-12-20 MED ORDER — INSULIN REGULAR(HUMAN) IN NACL 100-0.9 UT/100ML-% IV SOLN
INTRAVENOUS | Status: AC
  Administered 2019-12-21: 4.4 [IU]/h via INTRAVENOUS
  Filled 2019-12-20: qty 100

## 2019-12-20 MED ORDER — DEXMEDETOMIDINE HCL IN NACL 400 MCG/100ML IV SOLN
0.1000 ug/kg/h | INTRAVENOUS | Status: AC
  Administered 2019-12-21: .5 ug/kg/h via INTRAVENOUS
  Filled 2019-12-20: qty 100

## 2019-12-20 MED ORDER — CHLORHEXIDINE GLUCONATE CLOTH 2 % EX PADS
6.0000 | MEDICATED_PAD | Freq: Once | CUTANEOUS | Status: AC
  Administered 2019-12-21: 6 via TOPICAL

## 2019-12-20 MED ORDER — POTASSIUM CHLORIDE 2 MEQ/ML IV SOLN
80.0000 meq | INTRAVENOUS | Status: DC
  Filled 2019-12-20: qty 40

## 2019-12-20 MED ORDER — PLASMA-LYTE 148 IV SOLN
INTRAVENOUS | Status: DC
  Filled 2019-12-20: qty 2.5

## 2019-12-20 MED ORDER — SODIUM CHLORIDE 0.9 % IV SOLN
INTRAVENOUS | Status: DC
  Filled 2019-12-20: qty 30

## 2019-12-20 MED ORDER — TRANEXAMIC ACID (OHS) BOLUS VIA INFUSION
15.0000 mg/kg | INTRAVENOUS | Status: AC
  Administered 2019-12-21: 996 mg via INTRAVENOUS
  Filled 2019-12-20: qty 996

## 2019-12-20 MED ORDER — TRANEXAMIC ACID (OHS) PUMP PRIME SOLUTION
2.0000 mg/kg | INTRAVENOUS | Status: DC
  Filled 2019-12-20: qty 1.33

## 2019-12-20 MED ORDER — NITROGLYCERIN IN D5W 200-5 MCG/ML-% IV SOLN
2.0000 ug/min | INTRAVENOUS | Status: AC
  Administered 2019-12-21: 5 ug/min via INTRAVENOUS
  Filled 2019-12-20: qty 250

## 2019-12-20 NOTE — Progress Notes (Signed)
1 Day Post-Op Procedure(s) (LRB): LEFT HEART CATH AND CORONARY ANGIOGRAPHY (N/A) Subjective: No complaints  Objective: Vital signs in last 24 hours: Temp:  [97.5 F (36.4 C)-98.7 F (37.1 C)] 97.5 F (36.4 C) (10/05 0413) Pulse Rate:  [59-68] 61 (10/05 0413) Cardiac Rhythm: Sinus bradycardia (10/05 0700) Resp:  [16] 16 (10/04 1136) BP: (110-138)/(57-69) 110/68 (10/05 0413) SpO2:  [95 %] 95 % (10/05 0413) Weight:  [66.4 kg] 66.4 kg (10/05 0400)  Hemodynamic parameters for last 24 hours:    Intake/Output from previous day: 10/04 0701 - 10/05 0700 In: 441.8 [P.O.:240; I.V.:201.8] Out: -  Intake/Output this shift: No intake/output data recorded.  General appearance: alert, cooperative and no distress Neurologic: intact Heart: regular rate and rhythm Lungs: clear to auscultation bilaterally  Lab Results: Recent Labs    12/19/19 0425 12/20/19 0537  WBC 7.5 6.5  HGB 14.5 14.4  HCT 42.9 43.7  PLT 223 224   BMET:  Recent Labs    12/19/19 0425 12/20/19 0537  NA 138 137  K 3.7 3.7  CL 107 105  CO2 22 22  GLUCOSE 135* 124*  BUN 19 14  CREATININE 0.94 0.93  CALCIUM 8.9 9.1    PT/INR: No results for input(s): LABPROT, INR in the last 72 hours. ABG No results found for: PHART, HCO3, TCO2, ACIDBASEDEF, O2SAT CBG (last 3)  Recent Labs    12/20/19 0004 12/20/19 0418 12/20/19 0725  GLUCAP 188* 165* 135*    Assessment/Plan: S/P Procedure(s) (LRB): LEFT HEART CATH AND CORONARY ANGIOGRAPHY (N/A) -3 vessel CAD s/p non-STEMI, moderately severe AS For AVR CABG tomorrow All questions answered   LOS: 3 days    [place] Nakayama 12/20/2019

## 2019-12-20 NOTE — Anesthesia Preprocedure Evaluation (Addendum)
Anesthesia Evaluation  Patient identified by MRN, date of birth, ID band Patient awake    Reviewed: Allergy & Precautions, H&P , NPO status , Patient's Chart, lab work & pertinent test results  Airway Mallampati: II  TM Distance: >3 FB Neck ROM: Full    Dental no notable dental hx. (+) Teeth Intact, Dental Advisory Given   Pulmonary sleep apnea , Current Smoker and Patient abstained from smoking.,    Pulmonary exam normal breath sounds clear to auscultation       Cardiovascular Exercise Tolerance: Good hypertension, Pt. on medications + CAD and + Past MI  + Valvular Problems/Murmurs AS  Rhythm:Regular Rate:Normal     Neuro/Psych Depression negative neurological ROS     GI/Hepatic Neg liver ROS, GERD  ,  Endo/Other  diabetes, Oral Hypoglycemic AgentsHypothyroidism   Renal/GU negative Renal ROS  negative genitourinary   Musculoskeletal  (+) Arthritis ,   Abdominal   Peds  Hematology negative hematology ROS (+)   Anesthesia Other Findings   Reproductive/Obstetrics negative OB ROS                            Anesthesia Physical Anesthesia Plan  ASA: IV  Anesthesia Plan: General   Post-op Pain Management:    Induction: Intravenous  PONV Risk Score and Plan: 1 and Midazolam and Ondansetron  Airway Management Planned: Oral ETT  Additional Equipment: Arterial line, CVP, PA Cath, TEE, Ultrasound Guidance Line Placement and 3D TEE  Intra-op Plan:   Post-operative Plan: Post-operative intubation/ventilation  Informed Consent: I have reviewed the patients History and Physical, chart, labs and discussed the procedure including the risks, benefits and alternatives for the proposed anesthesia with the patient or authorized representative who has indicated his/her understanding and acceptance.     Dental advisory given  Plan Discussed with: CRNA  Anesthesia Plan Comments:         Anesthesia Quick Evaluation

## 2019-12-20 NOTE — Progress Notes (Signed)
CARDIAC REHAB PHASE I   PRE:  Rate/Rhythm: 82 SR after BR    BP: standing 118/55    SaO2:   MODE:  Ambulation: 400 ft   POST:  Rate/Rhythm: 79 SR    BP: sitting 124/65     SaO2:   Tolerated well, feels good. No c/o. Discussed IS (2500+ mL), sternal precautions, mobility post op and d/c planning. Pt receptive, appropriate questions. Gave materials to view/read. His wife will be with him at d/c. [place], ACSM 12/20/2019 2:33 PM

## 2019-12-20 NOTE — Progress Notes (Signed)
Progress Note  Patient Name: Steven Lawrence Date of Encounter: 12/20/2019  [facility] Cardiologist: Fransico Him, MD   Subjective   No acute overnight events. No recurrent chest pain. No shortness of breath. He ambulated with cardiac rehab today and did well with that. Plan is for CABG tomorrow.  Inpatient Medications    Scheduled Meds: . aspirin  81 mg Oral Daily  . atorvastatin  80 mg Oral Daily  . [START ON 12/21/2019] chlorhexidine  15 mL Mouth/Throat Once  . Chlorhexidine Gluconate Cloth  6 each Topical Once   And  . Chlorhexidine Gluconate Cloth  6 each Topical Once  . [START ON 12/21/2019] diazepam  2 mg Oral Once  . doxazosin  2 mg Oral q morning - 10a  . [START ON 12/21/2019] epinephrine  0-10 mcg/min Intravenous To OR  . [START ON 12/21/2019] heparin-papaverine-plasmalyte irrigation   Irrigation To OR  . insulin aspart  0-9 Units Subcutaneous Q4H  . [START ON 12/21/2019] insulin   Intravenous To OR  . levothyroxine  75 mcg Oral Q0600  . lisinopril  10 mg Oral Daily  . [START ON 12/21/2019] magnesium sulfate  40 mEq Other To OR  . metoprolol tartrate  12.5 mg Oral BID  . [START ON 12/21/2019] metoprolol tartrate  12.5 mg Oral Once  . [START ON 12/21/2019] phenylephrine  30-200 mcg/min Intravenous To OR  . [START ON 12/21/2019] potassium chloride  80 mEq Other To OR  . pramipexole  0.125 mg Oral QHS  . sodium chloride flush  3 mL Intravenous Q12H  . sodium chloride flush  3 mL Intravenous Q12H  . [START ON 12/21/2019] tranexamic acid  15 mg/kg Intravenous To OR  . [START ON 12/21/2019] tranexamic acid  2 mg/kg Intracatheter To OR   Continuous Infusions: . sodium chloride    . [START ON 12/21/2019] cefUROXime (ZINACEF)  IV    . [START ON 12/21/2019] cefUROXime (ZINACEF)  IV    . [START ON 12/21/2019] dexmedetomidine    . [START ON 12/21/2019] heparin 30,000 units/NS 1000 mL solution for CELLSAVER    . heparin 1,250 Units/hr (12/20/19 0211)  . [START ON 12/21/2019] milrinone     . [START ON 12/21/2019] nitroGLYCERIN    . [START ON 12/21/2019] norepinephrine    . [START ON 12/21/2019] tranexamic acid (CYKLOKAPRON) infusion ([facility])    . [START ON 12/21/2019] vancomycin     PRN Meds: sodium chloride, acetaminophen **OR** acetaminophen, morphine injection, nitroGLYCERIN, ondansetron **OR** ondansetron (ZOFRAN) IV, sodium chloride flush, temazepam   Vital Signs    Vitals:   12/20/19 0400 12/20/19 0413 12/20/19 0910 12/20/19 1109  BP:  110/68 122/65 118/70  Pulse:  61 70 63  Resp:   16 16  Temp:  (!) 97.5 F (36.4 C) 98.5 F (36.9 C) 97.9 F (36.6 C)  TempSrc:  Oral Oral   SpO2:  95% 98% 97%  Weight: 66.4 kg     Height:        Intake/Output Summary (Last 24 hours) at 12/20/2019 1421 Last data filed at 12/20/2019 1407 Gross per 24 hour  Intake 881.75 ml  Output --  Net 881.75 ml   Last 3 Weights 12/20/2019 12/19/2019 12/18/2019  Weight (lbs) 146 lb 6.4 oz 146 lb 11.2 oz 146 lb 11.2 oz  Weight (kg) 66.407 kg 66.543 kg 66.543 kg      Telemetry    Sinus rhythm with rates in the 60's at rest and up to the 90's (once in the 120's)  with activity/ambulation. - Personally Reviewed  ECG    No new ECG tracing today. - Personally Reviewed  Physical Exam   GEN: No acute distress.   Neck: Supple. Cardiac: RRR. II-III/VI systolic murmur. No rubs or gallops. Radial pulses 2+ and equal bilaterally. Right radial cath site soft with no signs of hematoma. Respiratory: Clear to auscultation bilaterally. No wheezes, rhonchi, or rales. GI: Soft, non-tender, non-distended.  MS: No lower extremity edema. No deformity. Skin: Warm and dry. Neuro:  No focal deficits. Psych: Normal affect. Responds appropriately.  Labs    High Sensitivity Troponin:   Recent Labs  Lab 12/16/19 2043 12/16/19 2240 12/17/19 0215 12/17/19 0553  TROPONINIHS 13 59* 108* 136*      Chemistry Recent Labs  Lab 12/16/19 2043 12/17/19 0215 12/17/19 0553 12/19/19 0425 12/20/19 0537  NA  138  --  137 138 137  K 4.0  --  4.3 3.7 3.7  CL 104  --  104 107 105  CO2 22  --  23 22 22   GLUCOSE 265*  --  167* 135* 124*  BUN 20  --  19 19 14   CREATININE 1.02   < > 0.79 0.94 0.93  CALCIUM 9.1  --  8.7* 8.9 9.1  PROT 5.9*  --   --   --   --   ALBUMIN 3.7  --   --   --   --   AST 17  --   --   --   --   ALT 19  --   --   --   --   ALKPHOS 51  --   --   --   --   BILITOT 0.8  --   --   --   --   GFRNONAA >60   < > >60 >60 >60  GFRAA >60   < > >60 >60 >60  ANIONGAP 12  --  10 9 10    < > = values in this interval not displayed.     Hematology Recent Labs  Lab 12/18/19 0344 12/19/19 0425 12/20/19 0537  WBC 7.3 7.5 6.5  RBC 4.48 4.43 4.55  HGB 14.3 14.5 14.4  HCT 43.3 42.9 43.7  MCV 96.7 96.8 96.0  MCH 31.9 32.7 31.6  MCHC 33.0 33.8 33.0  RDW 13.9 14.0 13.8  PLT 237 223 224    BNPNo results for input(s): BNP, PROBNP in the last 168 hours.   DDimer No results for input(s): DDIMER in the last 168 hours.   Radiology    CARDIAC CATHETERIZATION  Result Date: 12/19/2019  Ost RCA to Prox RCA lesion is 100% stenosed.  Prox Cx lesion is 95% stenosed.  Mid Cx lesion is 95% stenosed.  Mid LAD lesion is 80% stenosed.  Steven Lawrence is a 69 y.o. male  [id number] LOCATION:  FACILITY: [facility] PHYSICIAN: Quay Burow, M.D. May 20, 1950 DATE OF PROCEDURE:  12/19/2019 DATE OF DISCHARGE: CARDIAC CATHETERIZATION History obtained from chart review.69 year old male with past medical history of diabetes mellitus, hyperlipidemia, restless leg syndrome, benign prostatic hypertrophy, tobacco abuse admitted with non-ST elevation myocardial infarction.  Echocardiogram shows normal LV function, mild left ventricular hypertrophy, moderate aortic stenosis with mean gradient 22 mmHg.   Steven Lawrence has diffusely calcified vessels, three-vessel disease and preserved LV function.  He did not have a pullback gradient across the aortic valve.  His right coronary artery is totally occluded with left-to-right  collaterals and he has high-grade calcified tandem AV groove circumflex lesions as well  as a mid LAD lesion.  I believe the best therapy would be complete revascularization with CABG.  The sheath was removed and a TR band was placed on the right wrist to achieve patent hemostasis.  The patient left lab in stable condition.  Heparin will be restarted 4 hours after sheath removal.  T CTS will be notified. Quay Burow. MD, [facility] 12/19/2019 8:14 AM   VAS US DOPPLER PRE CABG  Result Date: 12/19/2019 PREOPERATIVE VASCULAR EVALUATION  Indications:      Pre-CABG. Risk Factors:     None. Comparison Study: No prior studies. Performing Technologist: Carlos Levering RVT  Examination Guidelines: A complete evaluation includes B-mode imaging, spectral Doppler, color Doppler, and power Doppler as needed of all accessible portions of each vessel. Bilateral testing is considered an integral part of a complete examination. Limited examinations for reoccurring indications may be performed as noted.  Right Carotid Findings: +----------+--------+--------+--------+-----------------------+--------+           PSV cm/sEDV cm/sStenosisDescribe               Comments +----------+--------+--------+--------+-----------------------+--------+ CCA Prox  91      18              smooth and heterogenous         +----------+--------+--------+--------+-----------------------+--------+ CCA Distal76      15              smooth and heterogenous         +----------+--------+--------+--------+-----------------------+--------+ ICA Prox  71      16              calcific                        +----------+--------+--------+--------+-----------------------+--------+ ICA Distal108     12              smooth and heterogenous         +----------+--------+--------+--------+-----------------------+--------+ ECA       152     16                                               +----------+--------+--------+--------+-----------------------+--------+ Portions of this table do not appear on this page. +----------+--------+-------+--------+------------+           PSV cm/sEDV cmsDescribeArm Pressure +----------+--------+-------+--------+------------+ Subclavian118                                 +----------+--------+-------+--------+------------+ +---------+--------+--+--------+-+ VertebralPSV cm/s46EDV cm/s6 +---------+--------+--+--------+-+ Left Carotid Findings: +----------+--------+--------+--------+-----------------------+--------+           PSV cm/sEDV cm/sStenosisDescribe               Comments +----------+--------+--------+--------+-----------------------+--------+ CCA Prox  108     16              smooth and heterogenous         +----------+--------+--------+--------+-----------------------+--------+ CCA Distal83      16              smooth and heterogenous         +----------+--------+--------+--------+-----------------------+--------+ ICA Prox  67      18              calcific                        +----------+--------+--------+--------+-----------------------+--------+  ICA Distal64      21                                              +----------+--------+--------+--------+-----------------------+--------+ ECA       100     10                                              +----------+--------+--------+--------+-----------------------+--------+ +----------+--------+--------+--------+------------+ SubclavianPSV cm/sEDV cm/sDescribeArm Pressure +----------+--------+--------+--------+------------+           129                                  +----------+--------+--------+--------+------------+ +---------+--------+--+--------+--+---------+ VertebralPSV cm/s50EDV cm/s15Antegrade +---------+--------+--+--------+--+---------+  ABI Findings: +--------+------------------+-----+---------+--------+ Right   Rt  Pressure (mmHg)IndexWaveform Comment  +--------+------------------+-----+---------+--------+ [id number]                    triphasic         +--------+------------------+-----+---------+--------+ PTA     137               1.20 biphasic          +--------+------------------+-----+---------+--------+ DP      161               1.41 triphasic         +--------+------------------+-----+---------+--------+ +--------+------------------+-----+---------+-------+ Left    Lt Pressure (mmHg)IndexWaveform Comment +--------+------------------+-----+---------+-------+ [id number]                    triphasic        +--------+------------------+-----+---------+-------+ PTA     139               1.22 biphasic         +--------+------------------+-----+---------+-------+ DP      140               1.23 triphasic        +--------+------------------+-----+---------+-------+  Right Doppler Findings: +--------+--------+-----+---------+--------+ Site    PressureIndexDoppler  Comments +--------+--------+-----+---------+--------+ [id number]          triphasic         +--------+--------+-----+---------+--------+ Radial               triphasic         +--------+--------+-----+---------+--------+ Ulnar                triphasic         +--------+--------+-----+---------+--------+  Left Doppler Findings: +--------+--------+-----+---------+--------+ Site    PressureIndexDoppler  Comments +--------+--------+-----+---------+--------+ [id number]          triphasic         +--------+--------+-----+---------+--------+ Radial               triphasic         +--------+--------+-----+---------+--------+ Ulnar                triphasic         +--------+--------+-----+---------+--------+  Summary: Right Carotid: Velocities in the right ICA are consistent with a 1-39% stenosis. Left Carotid: Velocities in the left ICA are consistent with a 1-39% stenosis.  Vertebrals: Bilateral vertebral arteries demonstrate antegrade flow. Right ABI: Resting right ankle-brachial index indicates noncompressible right lower extremity arteries. Left ABI: Resting left ankle-brachial index  is within normal range. No evidence of significant left lower extremity arterial disease. Right Upper Extremity: Doppler waveforms remain within normal limits with right radial compression. Doppler waveform obliterate with right ulnar compression. Left Upper Extremity: Doppler waveforms decrease <50% w left radial compression. Doppler waveforms decrease >50% with left ulnar compression.  Electronically signed by Ruta Hinds MD on 12/19/2019 at 4:53:58 PM.    Final     Cardiac Studies   Echocardiogram 12/17/2019: Impressions: 1. Left ventricular ejection fraction, by estimation, is 60 to 65%. The  left ventricle has normal function. The left ventricle has no regional  wall motion abnormalities. There is mild left ventricular hypertrophy.  Left ventricular diastolic parameters  were normal.  2. Right ventricular systolic function is normal. The right ventricular  size is normal.  3. The mitral valve is normal in structure. Trivial mitral valve  regurgitation. No evidence of mitral stenosis.  4. The aortic valve has an indeterminant number of cusps. There is  moderate calcification of the aortic valve. There is moderate thickening  of the aortic valve. Aortic valve regurgitation is not visualized.  Moderate aortic valve stenosis.  5. The inferior vena cava is normal in size with greater than 50%  respiratory variability, suggesting right atrial pressure of 3 mmHg. _______________  Left Heart Catheterization 12/19/2019:  Ost RCA to Prox RCA lesion is 100% stenosed.  Prox Cx lesion is 95% stenosed.  Mid Cx lesion is 95% stenosed.  Mid LAD lesion is 80% stenosed.   Impressions: Mr. Gough has diffusely calcified vessels, three-vessel disease and preserved LV function.  He  did not have a pullback gradient across the aortic valve.  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  I believe the best therapy would be complete revascularization with CABG.  The sheath was removed and a TR band was placed on the right wrist to achieve patent hemostasis.  The patient left lab in stable condition. Heparin will be restarted 4 hours after sheath removal.  T CTS will be notified.  Diagnostic Dominance: Right     Patient Profile     69 y.o. male with a history of hyperlipidemia, diabetes mellitus, tobacco abuse, restless leg syndrome, BPH, and depression who was admitted with NSTEMI on 12/16/2019 after presenting with chest pain. LHC showed multivessel CAD. Plan is for CABG on 12/21/2019.  Assessment & Plan    NSTEMI - High sensitivity troponin 13 >> 59 >> 108 >> 136.  - EKG shows what looks like early repolarization changes but no acute ischemic changes. - Echo showed LVEF of 60-65% with no wall motion abnormalities and moderate AS. - LHC showed severe multivessel CAD. CT surgery was consulted and plan is for CABG on 12/21/2019. - No recurrent chest pain. - Continue IV Heparin. - Continue aspirin, beta-blocker, and high-intensity statin.  Moderate Aortic Stenosis - Noted on Echo.  - CT surgery is planning for AVR at time of CABG.  Hypertension - BP well controlled - Continue Lisinopril 10mg  daily and Lopressor 12.5mg  twice daily.  Hyperlipidemia - Lipid panel this admission: Total Cholesterol 124, Triglycerides 47, HDL 49, LDL 66. - On Lipitor 20mg  daily at home but this was increased to 80mg  daily this admission. Continue higher dose.  - Will also need lipid panel and LFTs checked 6-8 weeks after increasing statin.  Diabetes Mellitus - Hemoglobin A1c 7.1 this admission. - Management per primary  team.   For questions or updates,  please contact [place] Please consult [email or web address] for contact info under        Signed, Darreld Mclean, PA-C  12/20/2019, 2:21 PM

## 2019-12-20 NOTE — Progress Notes (Signed)
ANTICOAGULATION CONSULT NOTE  Pharmacy Consult for heparin Indication: chest pain/ACS  No Known Allergies  Patient Measurements: Height: 5\' 9"  (175.3 cm) Weight: 66.4 kg (146 lb 6.4 oz) (B) IBW/kg (Calculated) : 70.7 Heparin Dosing Weight: 68 kg   Vital Signs: Temp: 97.5 F (36.4 C) (10/05 0413) Temp Source: Oral (10/05 0413) BP: 110/68 (10/05 0413) Pulse Rate: 61 (10/05 0413)  Labs: Recent Labs    12/18/19 0344 12/18/19 1050 12/19/19 0425 12/19/19 1921 12/20/19 0537  HGB 14.3  --  14.5  --  14.4  HCT 43.3  --  42.9  --  43.7  PLT 237  --  223  --  224  HEPARINUNFRC 0.18*   < > 0.49 0.41 0.49  CREATININE  --   --  0.94  --  0.93   < > = values in this interval not displayed.    Estimated Creatinine Clearance: 70.4 mL/min (by C-G formula based on SCr of 0.93 mg/dL).   Medical History: Past Medical History:  Diagnosis Date  . Acid reflux   . Arthritis   . BPH (benign prostatic hyperplasia)    mild  . Depression   . Diabetes mellitus without complication ([place])    type II  . Heart murmur    with AVSC echo 2009  . History of pneumonia as a child   . Hypercholesteremia   . Hypothyroidism   . RLS (restless legs syndrome)   . Sleep apnea    does not use a cpap,primarily with upper airway resistance syndrome AHI 2.35/hr, RDI 10.2/hr ([name])  . Urinary frequency   . Wears glasses   . Wears hearing aid    both ears    Medications:  Medications Prior to Admission  Medication Sig Dispense Refill Last Dose  . aspirin 81 MG tablet Take 81 mg by mouth daily.   12/16/2019 at Unknown time  . atorvastatin (LIPITOR) 20 MG tablet Take 20 mg by mouth daily.    12/16/2019 at Unknown time  . canagliflozin (INVOKANA) 100 MG TABS tablet Take 100 mg by mouth daily.   12/16/2019 at Unknown time  . cholestyramine light (PREVALITE) 4 GM/DOSE powder Take 1 g by mouth daily as needed (For stomach).   12/15/2019 at Unknown time  . doxazosin (CARDURA) 2 MG tablet Take 2 mg by mouth  every morning. Takes for urinary freq   12/16/2019 at Unknown time  . LEVEMIR FLEXTOUCH 100 UNIT/ML FlexPen Inject 20 Units into the skin at bedtime.   12/15/2019 at Unknown time  . levothyroxine (SYNTHROID, LEVOTHROID) 50 MCG tablet Take 50 mcg by mouth daily before breakfast.   12/16/2019 at Unknown time  . lisinopril (PRINIVIL,ZESTRIL) 10 MG tablet Take 10 mg by mouth daily.   12/16/2019 at Unknown time  . metFORMIN (GLUCOPHAGE) 1000 MG tablet Take 1,000 mg by mouth 2 (two) times daily with a meal.   12/16/2019 at Unknown time  . ONGLYZA 5 MG TABS tablet Take 5 mg by mouth.    12/16/2019 at Unknown time  . rOPINIRole (REQUIP) 1 MG tablet Take 2 mg by mouth at bedtime.   12/16/2019 at Unknown time    Assessment: 23 YOM who presents with chest heaviness and slight shortness of breath. Troponins elevated concerning for ACS. Pharmacy consulted to doseIV heparin.   He is now s/p cath and noted with 3VCAD (also with moderate AS) and for CABG/AVR on 10/6. -heparin level at goal   Goal of Therapy:  Heparin level 0.3-0.7 units/ml Monitor platelets by  anticoagulation protocol: Yes   Plan: -Continue heparin at 1250 units/hr -Daily heparin level and CBC  Hildred Laser, PharmD Clinical Pharmacist **Pharmacist phone directory can now be found on [email or web address] ([email or web address]).  Listed under [place].

## 2019-12-20 NOTE — Progress Notes (Signed)
Patient ID: Steven Lawrence, male   DOB: 04/11/1950, 70 y.o.   MRN: [id number]  PROGRESS NOTE    Steven Lawrence  NFA:[id number] DOB: 1950-07-24 DOA: 12/16/2019 PCP: Lennie Odor, PA   Brief Narrative:  69 year old male with history of tobacco abuse, diabetes mellitus type 2, hypertension, hypothyroidism, restless leg syndrome presented with chest pain and elevated troponin.  Echo showed EF of 60 to 65% with moderate aortic valve stenosis.  He underwent cardiac catheterization on 12/19/2019 which showed multi-vessel disease.  Cardiothoracic surgery was consulted.  Assessment & Plan:   Non-STEMI Multivessel CAD -Patient presented with chest pain and elevated troponin.  Echo showed EF of 60 to 65% with moderate aortic valve stenosis. - He underwent cardiac catheterization on 12/19/2019 which showed multi-vessel disease. -Cardiothoracic surgery was consulted and is planning for CABG on 12/21/2019.  Currently chest pain-free.  Continue aspirin, statin, metoprolol and heparin drip  Moderate aortic stenosis -Noted on echo.  For AVR tomorrow  Hyperlipidemia -Continue high-dose statin.  Hypertension -Continue lisinopril and metoprolol  Restless leg syndrome -Continue pramipexole  Tobacco abuse -Patient was counseled regarding tobacco cessation by prior hospitalist  Hypothyroidism  -continue levothyroxine  DVT prophylaxis: Heparin drip Code Status: Full  family Communication: None at bedside Disposition Plan: Status is: Inpatient  Remains inpatient appropriate because:Inpatient level of care appropriate due to severity of illness   Dispo: The patient is from: Home              Anticipated d/c is to: Home              Anticipated d/c date is: > 3 days              Patient currently is not medically stable to d/c.  Consultants: Cardiology/cardiothoracic surgery   Procedures:  Echocardiogram 12/17/2019: LVEF 60 to 65%.  No R WMA.  Trivial MR.  Moderate aortic valve calcification with  moderate aortic valve stenosis.  Normal RAP cath 12/19/2019: Occluded RCA with left-to-right collaterals; diffuse moderate to severe mid LAD stenosis with severe proximal and mid LCx disease (just after AV groove branch that gives rise to L-R collaterals) --> MV CAD with moderate AS, recommend CABG (potential CABG AVR)  Antimicrobials:  None  Subjective: Patient seen and examined at bedside.  He denies any current chest pain.  No overnight fever, nausea or vomiting. Objective: Vitals:   12/19/19 1938 12/20/19 0400 12/20/19 0413 12/20/19 0910  BP: (!) 138/57  110/68 122/65  Pulse: 68  61 70  Resp:    16  Temp: 98.7 F (37.1 C)  (!) 97.5 F (36.4 C) 98.5 F (36.9 C)  TempSrc: Oral  Oral Oral  SpO2: 95%  95% 98%  Weight:  66.4 kg    Height:        Intake/Output Summary (Last 24 hours) at 12/20/2019 0950 Last data filed at 12/20/2019 4696 Gross per 24 hour  Intake 661.75 ml  Output --  Net 661.75 ml   Filed Weights   12/18/19 0328 12/19/19 0049 12/20/19 0400  Weight: 66.5 kg 66.5 kg 66.4 kg    Examination:  General exam: No acute distress. Respiratory system: Bilateral decreased breath sounds at bases Cardiovascular system: S1 & S2 heard, Rate controlled Gastrointestinal system: Abdomen is nondistended, soft and nontender. Normal bowel sounds heard. Extremities: No cyanosis, clubbing, edema     Data Reviewed: I have personally reviewed following labs and imaging studies  CBC: Recent Labs  Lab 12/16/19 2043 12/16/19 2043 12/17/19 0215 12/17/19  4401 12/18/19 0344 12/19/19 0425 12/20/19 0537  WBC 7.0   < > 6.7 6.9 7.3 7.5 6.5  NEUTROABS 3.7  --   --   --   --   --   --   HGB 13.6   < > 13.5 13.0 14.3 14.5 14.4  HCT 41.2   < > 40.6 40.1 43.3 42.9 43.7  MCV 96.0   < > 97.6 97.1 96.7 96.8 96.0  PLT 229   < > 217 222 237 223 224   < > = values in this interval not displayed.   Basic Metabolic Panel: Recent Labs  Lab 12/16/19 2043 12/17/19 0215 12/17/19 0553  12/19/19 0425 12/20/19 0537  NA 138  --  137 138 137  K 4.0  --  4.3 3.7 3.7  CL 104  --  104 107 105  CO2 22  --  23 22 22   GLUCOSE 265*  --  167* 135* 124*  BUN 20  --  19 19 14   CREATININE 1.02 0.95 0.79 0.94 0.93  CALCIUM 9.1  --  8.7* 8.9 9.1   GFR: Estimated Creatinine Clearance: 70.4 mL/min (by C-G formula based on SCr of 0.93 mg/dL). Liver Function Tests: Recent Labs  Lab 12/16/19 2043  AST 17  ALT 19  ALKPHOS 51  BILITOT 0.8  PROT 5.9*  ALBUMIN 3.7   Recent Labs  Lab 12/16/19 2043  LIPASE 48   No results for input(s): AMMONIA in the last 168 hours. Coagulation Profile: Recent Labs  Lab 12/16/19 2043  INR 1.0   Cardiac Enzymes: No results for input(s): CKTOTAL, CKMB, CKMBINDEX, TROPONINI in the last 168 hours. BNP (last 3 results) No results for input(s): PROBNP in the last 8760 hours. HbA1C: Recent Labs    12/17/19 1318  HGBA1C 7.1*   CBG: Recent Labs  Lab 12/19/19 1606 12/19/19 1934 12/20/19 0004 12/20/19 0418 12/20/19 0725  GLUCAP 144* 192* 188* 165* 135*   Lipid Profile: Recent Labs    12/20/19 0537  CHOL 124  HDL 49  LDLCALC 66  TRIG 47  CHOLHDL 2.5   Thyroid Function Tests: No results for input(s): TSH, T4TOTAL, FREET4, T3FREE, THYROIDAB in the last 72 hours. Anemia Panel: No results for input(s): VITAMINB12, FOLATE, FERRITIN, TIBC, IRON, RETICCTPCT in the last 72 hours. Sepsis Labs: No results for input(s): PROCALCITON, LATICACIDVEN in the last 168 hours.  Recent Results (from the past 240 hour(s))  Respiratory Panel by RT PCR (Flu A&B, Covid) - Nasopharyngeal Swab     Status: None   Collection Time: 12/16/19 10:26 PM   Specimen: Nasopharyngeal Swab  Result Value Ref Range Status   SARS Coronavirus 2 by RT PCR NEGATIVE NEGATIVE Final    Comment: (NOTE) SARS-CoV-2 target nucleic acids are NOT DETECTED.  The SARS-CoV-2 RNA is generally detectable in upper respiratoy specimens during the acute phase of infection. The  lowest concentration of SARS-CoV-2 viral copies this assay can detect is 131 copies/mL. A negative result does not preclude SARS-Cov-2 infection and should not be used as the sole basis for treatment or other patient management decisions. A negative result may occur with  improper specimen collection/handling, submission of specimen other than nasopharyngeal swab, presence of viral mutation(s) within the areas targeted by this assay, and inadequate number of viral copies (<131 copies/mL). A negative result must be combined with clinical observations, patient history, and epidemiological information. The expected result is Negative.  Fact Sheet for Patients:  PinkCheek.be  Fact Sheet for Healthcare Providers:  GravelBags.it  This test is no t yet approved or cleared by the [place] and  has been authorized for detection and/or diagnosis of SARS-CoV-2 by FDA under an Emergency Use Authorization (EUA). This EUA will remain  in effect (meaning this test can be used) for the duration of the COVID-19 declaration under Section 564(b)(1) of the Act, 21 U.S.C. section 360bbb-3(b)(1), unless the authorization is terminated or revoked sooner.     Influenza A by PCR NEGATIVE NEGATIVE Final   Influenza B by PCR NEGATIVE NEGATIVE Final    Comment: (NOTE) The Xpert Xpress SARS-CoV-2/FLU/RSV assay is intended as an aid in  the diagnosis of influenza from Nasopharyngeal swab specimens and  should not be used as a sole basis for treatment. Nasal washings and  aspirates are unacceptable for Xpert Xpress SARS-CoV-2/FLU/RSV  testing.  Fact Sheet for Patients: PinkCheek.be  Fact Sheet for Healthcare Providers: GravelBags.it  This test is not yet approved or cleared by the [place] FDA and  has been authorized for detection and/or diagnosis of SARS-CoV-2 by  FDA under  an Emergency Use Authorization (EUA). This EUA will remain  in effect (meaning this test can be used) for the duration of the  Covid-19 declaration under Section 564(b)(1) of the Act, 21  U.S.C. section 360bbb-3(b)(1), unless the authorization is  terminated or revoked. Performed at [facility], [place] [address], [place], [place] [address]          Radiology Studies: CARDIAC CATHETERIZATION  Result Date: 12/19/2019  Ost RCA to Prox RCA lesion is 100% stenosed.  Prox Cx lesion is 95% stenosed.  Mid Cx lesion is 95% stenosed.  Mid LAD lesion is 80% stenosed.  SHELDON SEM is a 69 y.o. male  [id number] LOCATION:  FACILITY: [facility] PHYSICIAN: Quay Burow, M.D. [birth year] DATE OF PROCEDURE:  12/19/2019 DATE OF DISCHARGE: CARDIAC CATHETERIZATION History obtained from chart review.69 year old male with past medical history of diabetes mellitus, hyperlipidemia, restless leg syndrome, benign prostatic hypertrophy, tobacco abuse admitted with non-ST elevation myocardial infarction.  Echocardiogram shows normal LV function, mild left ventricular hypertrophy, moderate aortic stenosis with mean gradient 22 mmHg.   Mr. Suarez has diffusely calcified vessels, three-vessel disease and preserved LV function.  He did not have a pullback gradient across the aortic valve.  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  I believe the best therapy would be complete revascularization with CABG.  The sheath was removed and a TR band was placed on the right wrist to achieve patent hemostasis.  The patient left lab in stable condition.  Heparin will be restarted 4 hours after sheath removal.  T CTS will be notified. Quay Burow. MD, [facility] 12/19/2019 8:14 AM   VAS US DOPPLER PRE CABG  Result Date: 12/19/2019 PREOPERATIVE VASCULAR EVALUATION  Indications:      Pre-CABG. Risk Factors:      None. Comparison Study: No prior studies. Performing Technologist: Carlos Levering RVT  Examination Guidelines: A complete evaluation includes B-mode imaging, spectral Doppler, color Doppler, and power Doppler as needed of all accessible portions of each vessel. Bilateral testing is considered an integral part of a complete examination. Limited examinations for reoccurring indications may be performed as noted.  Right Carotid Findings: +----------+--------+--------+--------+-----------------------+--------+           PSV cm/sEDV cm/sStenosisDescribe  Comments +----------+--------+--------+--------+-----------------------+--------+ CCA Prox  91      18              smooth and heterogenous         +----------+--------+--------+--------+-----------------------+--------+ CCA Distal76      15              smooth and heterogenous         +----------+--------+--------+--------+-----------------------+--------+ ICA Prox  71      16              calcific                        +----------+--------+--------+--------+-----------------------+--------+ ICA Distal108     12              smooth and heterogenous         +----------+--------+--------+--------+-----------------------+--------+ ECA       152     16                                              +----------+--------+--------+--------+-----------------------+--------+ Portions of this table do not appear on this page. +----------+--------+-------+--------+------------+           PSV cm/sEDV cmsDescribeArm Pressure +----------+--------+-------+--------+------------+ Subclavian118                                 +----------+--------+-------+--------+------------+ +---------+--------+--+--------+-+ VertebralPSV cm/s46EDV cm/s6 +---------+--------+--+--------+-+ Left Carotid Findings: +----------+--------+--------+--------+-----------------------+--------+           PSV cm/sEDV cm/sStenosisDescribe                Comments +----------+--------+--------+--------+-----------------------+--------+ CCA Prox  108     16              smooth and heterogenous         +----------+--------+--------+--------+-----------------------+--------+ CCA Distal83      16              smooth and heterogenous         +----------+--------+--------+--------+-----------------------+--------+ ICA Prox  67      18              calcific                        +----------+--------+--------+--------+-----------------------+--------+ ICA Distal64      21                                              +----------+--------+--------+--------+-----------------------+--------+ ECA       100     10                                              +----------+--------+--------+--------+-----------------------+--------+ +----------+--------+--------+--------+------------+ SubclavianPSV cm/sEDV cm/sDescribeArm Pressure +----------+--------+--------+--------+------------+           129                                  +----------+--------+--------+--------+------------+ +---------+--------+--+--------+--+---------+ VertebralPSV cm/s50EDV cm/s15Antegrade +---------+--------+--+--------+--+---------+  ABI Findings: +--------+------------------+-----+---------+--------+ Right   Rt  Pressure (mmHg)IndexWaveform Comment  +--------+------------------+-----+---------+--------+ [id number]                    triphasic         +--------+------------------+-----+---------+--------+ PTA     137               1.20 biphasic          +--------+------------------+-----+---------+--------+ DP      161               1.41 triphasic         +--------+------------------+-----+---------+--------+ +--------+------------------+-----+---------+-------+ Left    Lt Pressure (mmHg)IndexWaveform Comment +--------+------------------+-----+---------+-------+ [id number]                    triphasic         +--------+------------------+-----+---------+-------+ PTA     139               1.22 biphasic         +--------+------------------+-----+---------+-------+ DP      140               1.23 triphasic        +--------+------------------+-----+---------+-------+  Right Doppler Findings: +--------+--------+-----+---------+--------+ Site    PressureIndexDoppler  Comments +--------+--------+-----+---------+--------+ [id number]          triphasic         +--------+--------+-----+---------+--------+ Radial               triphasic         +--------+--------+-----+---------+--------+ Ulnar                triphasic         +--------+--------+-----+---------+--------+  Left Doppler Findings: +--------+--------+-----+---------+--------+ Site    PressureIndexDoppler  Comments +--------+--------+-----+---------+--------+ [id number]          triphasic         +--------+--------+-----+---------+--------+ Radial               triphasic         +--------+--------+-----+---------+--------+ Ulnar                triphasic         +--------+--------+-----+---------+--------+  Summary: Right Carotid: Velocities in the right ICA are consistent with a 1-39% stenosis. Left Carotid: Velocities in the left ICA are consistent with a 1-39% stenosis. Vertebrals: Bilateral vertebral arteries demonstrate antegrade flow. Right ABI: Resting right ankle-brachial index indicates noncompressible right lower extremity arteries. Left ABI: Resting left ankle-brachial index is within normal range. No evidence of significant left lower extremity arterial disease. Right Upper Extremity: Doppler waveforms remain within normal limits with right radial compression. Doppler waveform obliterate with right ulnar compression. Left Upper Extremity: Doppler waveforms decrease <50% w left radial compression. Doppler waveforms decrease >50% with left ulnar compression.  Electronically signed by Ruta Hinds MD on 12/19/2019 at 4:53:58 PM.    Final         Scheduled Meds: . aspirin  81 mg Oral Daily  . atorvastatin  80 mg Oral Daily  . doxazosin  2 mg Oral q morning - 10a  . insulin aspart  0-9 Units Subcutaneous Q4H  . levothyroxine  75 mcg Oral Q0600  . lisinopril  10 mg Oral Daily  . metoprolol tartrate  12.5 mg Oral BID  . pramipexole  0.125 mg Oral QHS  . sodium chloride flush  3 mL Intravenous Q12H  . sodium chloride flush  3 mL Intravenous Q12H   Continuous Infusions: . sodium chloride    .  heparin 1,250 Units/hr (12/20/19 0211)          Aline August, MD [facility] 12/20/2019, 9:50 AM

## 2019-12-21 ENCOUNTER — Inpatient Hospital Stay (HOSPITAL_COMMUNITY)
Admission: EM | Disposition: A | Discharge status: Home / Self Care | Payer: Self-pay | Source: Home / Self Care | Attending: Thoracic Surgery (Cardiothoracic Vascular Surgery)

## 2019-12-21 ENCOUNTER — Inpatient Hospital Stay (HOSPITAL_COMMUNITY): Payer: Medicare Other | Admitting: Registered Nurse

## 2019-12-21 ENCOUNTER — Inpatient Hospital Stay (HOSPITAL_COMMUNITY): Payer: Medicare Other

## 2019-12-21 DIAGNOSIS — I25110 Atherosclerotic heart disease of native coronary artery with unstable angina pectoris: Secondary | ICD-10-CM

## 2019-12-21 DIAGNOSIS — I350 Nonrheumatic aortic (valve) stenosis: Secondary | ICD-10-CM

## 2019-12-21 DIAGNOSIS — Z951 Presence of aortocoronary bypass graft: Secondary | ICD-10-CM

## 2019-12-21 HISTORY — PX: AORTIC VALVE REPLACEMENT: SHX41

## 2019-12-21 HISTORY — PX: CORONARY ARTERY BYPASS GRAFT: SHX141

## 2019-12-21 HISTORY — PX: ENDOVEIN HARVEST OF GREATER SAPHENOUS VEIN: SHX5059

## 2019-12-21 HISTORY — PX: TEE WITHOUT CARDIOVERSION: SHX5443

## 2019-12-21 LAB — POCT I-STAT 7, (LYTES, BLD GAS, ICA,H+H)
Acid-Base Excess: 0 mmol/L (ref 0.0–2.0)
Acid-Base Excess: 1 mmol/L (ref 0.0–2.0)
Acid-Base Excess: 1 mmol/L (ref 0.0–2.0)
Acid-base deficit: 1 mmol/L (ref 0.0–2.0)
Acid-base deficit: 3 mmol/L — ABNORMAL HIGH (ref 0.0–2.0)
Acid-base deficit: 3 mmol/L — ABNORMAL HIGH (ref 0.0–2.0)
Acid-base deficit: 4 mmol/L — ABNORMAL HIGH (ref 0.0–2.0)
Bicarbonate: 25.4 mmol/L (ref 20.0–28.0)
Bicarbonate: 26.2 mmol/L (ref 20.0–28.0)
Bicarbonate: 25.1 mmol/L (ref 20.0–28.0)
Bicarbonate: 25.1 mmol/L (ref 20.0–28.0)
Bicarbonate: 22.6 mmol/L (ref 20.0–28.0)
Bicarbonate: 20.8 mmol/L (ref 20.0–28.0)
Bicarbonate: 20.8 mmol/L (ref 20.0–28.0)
Calcium, Ion: 1.01 mmol/L — ABNORMAL LOW (ref 1.15–1.40)
Calcium, Ion: 1.06 mmol/L — ABNORMAL LOW (ref 1.15–1.40)
Calcium, Ion: 1.04 mmol/L — ABNORMAL LOW (ref 1.15–1.40)
Calcium, Ion: 1.02 mmol/L — ABNORMAL LOW (ref 1.15–1.40)
Calcium, Ion: 1.08 mmol/L — ABNORMAL LOW (ref 1.15–1.40)
Calcium, Ion: 1.12 mmol/L — ABNORMAL LOW (ref 1.15–1.40)
Calcium, Ion: 1.14 mmol/L — ABNORMAL LOW (ref 1.15–1.40)
HCT: 29 % — ABNORMAL LOW (ref 39.0–52.0)
HCT: 30 % — ABNORMAL LOW (ref 39.0–52.0)
HCT: 30 % — ABNORMAL LOW (ref 39.0–52.0)
HCT: 28 % — ABNORMAL LOW (ref 39.0–52.0)
HCT: 29 % — ABNORMAL LOW (ref 39.0–52.0)
HCT: 27 % — ABNORMAL LOW (ref 39.0–52.0)
HCT: 27 % — ABNORMAL LOW (ref 39.0–52.0)
Hemoglobin: 10.2 g/dL — ABNORMAL LOW (ref 13.0–17.0)
Hemoglobin: 9.9 g/dL — ABNORMAL LOW (ref 13.0–17.0)
Hemoglobin: 10.2 g/dL — ABNORMAL LOW (ref 13.0–17.0)
Hemoglobin: 9.5 g/dL — ABNORMAL LOW (ref 13.0–17.0)
Hemoglobin: 9.9 g/dL — ABNORMAL LOW (ref 13.0–17.0)
Hemoglobin: 9.2 g/dL — ABNORMAL LOW (ref 13.0–17.0)
Hemoglobin: 9.2 g/dL — ABNORMAL LOW (ref 13.0–17.0)
O2 Saturation: 100 %
O2 Saturation: 89 %
O2 Saturation: 100 %
O2 Saturation: 100 %
O2 Saturation: 99 %
O2 Saturation: 99 %
O2 Saturation: 97 %
Patient temperature: 35.5
Patient temperature: 37.3
Potassium: 3.8 mmol/L (ref 3.5–5.1)
Potassium: 4.1 mmol/L (ref 3.5–5.1)
Potassium: 3.8 mmol/L (ref 3.5–5.1)
Potassium: 4.6 mmol/L (ref 3.5–5.1)
Potassium: 3.8 mmol/L (ref 3.5–5.1)
Potassium: 4.4 mmol/L (ref 3.5–5.1)
Potassium: 4.6 mmol/L (ref 3.5–5.1)
Sodium: 139 mmol/L (ref 135–145)
Sodium: 139 mmol/L (ref 135–145)
Sodium: 140 mmol/L (ref 135–145)
Sodium: 139 mmol/L (ref 135–145)
Sodium: 142 mmol/L (ref 135–145)
Sodium: 142 mmol/L (ref 135–145)
Sodium: 143 mmol/L (ref 135–145)
TCO2: 27 mmol/L (ref 22–32)
TCO2: 28 mmol/L (ref 22–32)
TCO2: 26 mmol/L (ref 22–32)
TCO2: 26 mmol/L (ref 22–32)
TCO2: 24 mmol/L (ref 22–32)
TCO2: 22 mmol/L (ref 22–32)
TCO2: 22 mmol/L (ref 22–32)
pCO2 arterial: 47.3 mmHg (ref 32.0–48.0)
pCO2 arterial: 47.8 mmHg (ref 32.0–48.0)
pCO2 arterial: 37.3 mmHg (ref 32.0–48.0)
pCO2 arterial: 37.1 mmHg (ref 32.0–48.0)
pCO2 arterial: 37.9 mmHg (ref 32.0–48.0)
pCO2 arterial: 33.2 mmHg (ref 32.0–48.0)
pCO2 arterial: 37.2 mmHg (ref 32.0–48.0)
pH, Arterial: 7.338 — ABNORMAL LOW (ref 7.350–7.450)
pH, Arterial: 7.347 — ABNORMAL LOW (ref 7.350–7.450)
pH, Arterial: 7.437 (ref 7.350–7.450)
pH, Arterial: 7.438 (ref 7.350–7.450)
pH, Arterial: 7.377 (ref 7.350–7.450)
pH, Arterial: 7.404 (ref 7.350–7.450)
pH, Arterial: 7.357 (ref 7.350–7.450)
pO2, Arterial: 362 mmHg — ABNORMAL HIGH (ref 83.0–108.0)
pO2, Arterial: 60 mmHg — ABNORMAL LOW (ref 83.0–108.0)
pO2, Arterial: 297 mmHg — ABNORMAL HIGH (ref 83.0–108.0)
pO2, Arterial: 308 mmHg — ABNORMAL HIGH (ref 83.0–108.0)
pO2, Arterial: 139 mmHg — ABNORMAL HIGH (ref 83.0–108.0)
pO2, Arterial: 140 mmHg — ABNORMAL HIGH (ref 83.0–108.0)
pO2, Arterial: 94 mmHg (ref 83.0–108.0)

## 2019-12-21 LAB — POCT I-STAT, CHEM 8
BUN: 15 mg/dL (ref 8–23)
BUN: 15 mg/dL (ref 8–23)
BUN: 14 mg/dL (ref 8–23)
BUN: 13 mg/dL (ref 8–23)
BUN: 12 mg/dL (ref 8–23)
Calcium, Ion: 1.24 mmol/L (ref 1.15–1.40)
Calcium, Ion: 1.28 mmol/L (ref 1.15–1.40)
Calcium, Ion: 1.14 mmol/L — ABNORMAL LOW (ref 1.15–1.40)
Calcium, Ion: 1.04 mmol/L — ABNORMAL LOW (ref 1.15–1.40)
Calcium, Ion: 1.03 mmol/L — ABNORMAL LOW (ref 1.15–1.40)
Chloride: 104 mmol/L (ref 98–111)
Chloride: 102 mmol/L (ref 98–111)
Chloride: 101 mmol/L (ref 98–111)
Chloride: 104 mmol/L (ref 98–111)
Chloride: 103 mmol/L (ref 98–111)
Creatinine, Ser: 0.7 mg/dL (ref 0.61–1.24)
Creatinine, Ser: 0.7 mg/dL (ref 0.61–1.24)
Creatinine, Ser: 0.7 mg/dL (ref 0.61–1.24)
Creatinine, Ser: 0.6 mg/dL — ABNORMAL LOW (ref 0.61–1.24)
Creatinine, Ser: 0.5 mg/dL — ABNORMAL LOW (ref 0.61–1.24)
Glucose, Bld: 168 mg/dL — ABNORMAL HIGH (ref 70–99)
Glucose, Bld: 154 mg/dL — ABNORMAL HIGH (ref 70–99)
Glucose, Bld: 126 mg/dL — ABNORMAL HIGH (ref 70–99)
Glucose, Bld: 116 mg/dL — ABNORMAL HIGH (ref 70–99)
Glucose, Bld: 115 mg/dL — ABNORMAL HIGH (ref 70–99)
HCT: 37 % — ABNORMAL LOW (ref 39.0–52.0)
HCT: 39 % (ref 39.0–52.0)
HCT: 31 % — ABNORMAL LOW (ref 39.0–52.0)
HCT: 28 % — ABNORMAL LOW (ref 39.0–52.0)
HCT: 27 % — ABNORMAL LOW (ref 39.0–52.0)
Hemoglobin: 12.6 g/dL — ABNORMAL LOW (ref 13.0–17.0)
Hemoglobin: 13.3 g/dL (ref 13.0–17.0)
Hemoglobin: 10.5 g/dL — ABNORMAL LOW (ref 13.0–17.0)
Hemoglobin: 9.5 g/dL — ABNORMAL LOW (ref 13.0–17.0)
Hemoglobin: 9.2 g/dL — ABNORMAL LOW (ref 13.0–17.0)
Potassium: 4 mmol/L (ref 3.5–5.1)
Potassium: 3.9 mmol/L (ref 3.5–5.1)
Potassium: 3.9 mmol/L (ref 3.5–5.1)
Potassium: 4 mmol/L (ref 3.5–5.1)
Potassium: 3.8 mmol/L (ref 3.5–5.1)
Sodium: 139 mmol/L (ref 135–145)
Sodium: 139 mmol/L (ref 135–145)
Sodium: 139 mmol/L (ref 135–145)
Sodium: 140 mmol/L (ref 135–145)
Sodium: 141 mmol/L (ref 135–145)
TCO2: 25 mmol/L (ref 22–32)
TCO2: 24 mmol/L (ref 22–32)
TCO2: 27 mmol/L (ref 22–32)
TCO2: 26 mmol/L (ref 22–32)
TCO2: 23 mmol/L (ref 22–32)

## 2019-12-21 LAB — CBC
HCT: 29.8 % — ABNORMAL LOW (ref 39.0–52.0)
HCT: 29.1 % — ABNORMAL LOW (ref 39.0–52.0)
Hemoglobin: 9.9 g/dL — ABNORMAL LOW (ref 13.0–17.0)
Hemoglobin: 9.6 g/dL — ABNORMAL LOW (ref 13.0–17.0)
MCH: 32.2 pg (ref 26.0–34.0)
MCH: 32.4 pg (ref 26.0–34.0)
MCHC: 33.2 g/dL (ref 30.0–36.0)
MCHC: 33 g/dL (ref 30.0–36.0)
MCV: 97.1 fL (ref 80.0–100.0)
MCV: 98.3 fL (ref 80.0–100.0)
Platelets: 68 10*3/uL — ABNORMAL LOW (ref 150–400)
Platelets: 125 10*3/uL — ABNORMAL LOW (ref 150–400)
RBC: 3.07 MIL/uL — ABNORMAL LOW (ref 4.22–5.81)
RBC: 2.96 MIL/uL — ABNORMAL LOW (ref 4.22–5.81)
RDW: 13.8 % (ref 11.5–15.5)
RDW: 14.1 % (ref 11.5–15.5)
WBC: 7.5 10*3/uL (ref 4.0–10.5)
WBC: 9.2 10*3/uL (ref 4.0–10.5)
nRBC: 0 % (ref 0.0–0.2)
nRBC: 0 % (ref 0.0–0.2)

## 2019-12-21 LAB — BLOOD GAS, ARTERIAL
Acid-base deficit: 0.8 mmol/L (ref 0.0–2.0)
Bicarbonate: 23.2 mmol/L (ref 20.0–28.0)
FIO2: 21
O2 Saturation: 97.9 %
Patient temperature: 37
pCO2 arterial: 37.3 mmHg (ref 32.0–48.0)
pH, Arterial: 7.411 (ref 7.350–7.450)
pO2, Arterial: 113 mmHg — ABNORMAL HIGH (ref 83.0–108.0)

## 2019-12-21 LAB — GLUCOSE, CAPILLARY
Glucose-Capillary: 163 mg/dL — ABNORMAL HIGH (ref 70–99)
Glucose-Capillary: 101 mg/dL — ABNORMAL HIGH (ref 70–99)
Glucose-Capillary: 120 mg/dL — ABNORMAL HIGH (ref 70–99)
Glucose-Capillary: 130 mg/dL — ABNORMAL HIGH (ref 70–99)
Glucose-Capillary: 125 mg/dL — ABNORMAL HIGH (ref 70–99)
Glucose-Capillary: 107 mg/dL — ABNORMAL HIGH (ref 70–99)
Glucose-Capillary: 126 mg/dL — ABNORMAL HIGH (ref 70–99)
Glucose-Capillary: 113 mg/dL — ABNORMAL HIGH (ref 70–99)
Glucose-Capillary: 127 mg/dL — ABNORMAL HIGH (ref 70–99)

## 2019-12-21 LAB — BASIC METABOLIC PANEL
Anion gap: 7 (ref 5–15)
BUN: 12 mg/dL (ref 8–23)
CO2: 20 mmol/L — ABNORMAL LOW (ref 22–32)
Calcium: 7.6 mg/dL — ABNORMAL LOW (ref 8.9–10.3)
Chloride: 111 mmol/L (ref 98–111)
Creatinine, Ser: 0.79 mg/dL (ref 0.61–1.24)
GFR calc non Af Amer: >60 mL/min (ref >60)
Glucose, Bld: 104 mg/dL — ABNORMAL HIGH (ref 70–99)
Potassium: 4.4 mmol/L (ref 3.5–5.1)
Sodium: 138 mmol/L (ref 135–145)

## 2019-12-21 LAB — ECHO INTRAOPERATIVE TEE
AR max vel: 0.94 cm2
AV Area VTI: 1.07 cm2
AV Area mean vel: 0.9 cm2
AV Mean grad: 14 mmHg
AV Peak grad: 28.9 mmHg
Ao pk vel: 2.69 m/s
Height: 69 in
S' Lateral: 2.5 cm
Weight: 2315.2 oz

## 2019-12-21 LAB — PLATELET COUNT: Platelets: 107 10*3/uL — ABNORMAL LOW (ref 150–400)

## 2019-12-21 LAB — PROTIME-INR
INR: 1.5 — ABNORMAL HIGH (ref 0.8–1.2)
Prothrombin Time: 17.8 seconds — ABNORMAL HIGH (ref 11.4–15.2)

## 2019-12-21 LAB — HEMOGLOBIN AND HEMATOCRIT, BLOOD
HCT: 26.7 % — ABNORMAL LOW (ref 39.0–52.0)
Hemoglobin: 8.7 g/dL — ABNORMAL LOW (ref 13.0–17.0)

## 2019-12-21 LAB — APTT: aPTT: 39 seconds — ABNORMAL HIGH (ref 24–36)

## 2019-12-21 LAB — MAGNESIUM: Magnesium: 3.1 mg/dL — ABNORMAL HIGH (ref 1.7–2.4)

## 2019-12-21 SURGERY — CORONARY ARTERY BYPASS GRAFTING (CABG)
Anesthesia: General | Site: Leg Upper

## 2019-12-21 MED ORDER — PRAMIPEXOLE DIHYDROCHLORIDE 0.125 MG PO TABS
0.1250 mg | ORAL_TABLET | Freq: Every day | ORAL | Status: DC
  Administered 2019-12-22 – 2019-12-25 (×4): 0.125 mg via ORAL
  Filled 2019-12-21 (×6): qty 1

## 2019-12-21 MED ORDER — ASPIRIN EC 325 MG PO TBEC
325.0000 mg | DELAYED_RELEASE_TABLET | Freq: Every day | ORAL | Status: DC
  Administered 2019-12-23 – 2019-12-25 (×3): 325 mg via ORAL
  Filled 2019-12-21 (×3): qty 1

## 2019-12-21 MED ORDER — SODIUM CHLORIDE 0.9% FLUSH
3.0000 mL | Freq: Two times a day (BID) | INTRAVENOUS | Status: DC
  Administered 2019-12-22 – 2019-12-25 (×6): 3 mL via INTRAVENOUS

## 2019-12-21 MED ORDER — LACTATED RINGERS IV SOLN: INTRAVENOUS | Status: DC | PRN

## 2019-12-21 MED ORDER — ASPIRIN 81 MG PO CHEW
324.0000 mg | CHEWABLE_TABLET | Freq: Every day | ORAL | Status: DC
  Administered 2019-12-22: 324 mg
  Filled 2019-12-21 (×2): qty 4

## 2019-12-21 MED ORDER — EPHEDRINE 5 MG/ML INJ
INTRAVENOUS | Status: AC
  Filled 2019-12-21: qty 10

## 2019-12-21 MED ORDER — OXYCODONE HCL 5 MG PO TABS
5.0000 mg | ORAL_TABLET | ORAL | Status: DC | PRN
  Administered 2019-12-22 – 2019-12-25 (×4): 5 mg via ORAL
  Filled 2019-12-21 (×4): qty 1

## 2019-12-21 MED ORDER — CHLORHEXIDINE GLUCONATE CLOTH 2 % EX PADS
6.0000 | MEDICATED_PAD | Freq: Every day | CUTANEOUS | Status: DC
  Administered 2019-12-22 – 2019-12-24 (×2): 6 via TOPICAL

## 2019-12-21 MED ORDER — LEVOTHYROXINE SODIUM 75 MCG PO TABS
75.0000 ug | ORAL_TABLET | Freq: Every day | ORAL | Status: DC
  Administered 2019-12-22 – 2019-12-26 (×5): 75 ug via ORAL
  Filled 2019-12-21 (×5): qty 1

## 2019-12-21 MED ORDER — HEPARIN SODIUM (PORCINE) 1000 UNIT/ML IJ SOLN
INTRAMUSCULAR | Status: AC
  Filled 2019-12-21: qty 1

## 2019-12-21 MED ORDER — INSULIN REGULAR(HUMAN) IN NACL 100-0.9 UT/100ML-% IV SOLN: INTRAVENOUS | Status: DC

## 2019-12-21 MED ORDER — ROCURONIUM BROMIDE 10 MG/ML (PF) SYRINGE
PREFILLED_SYRINGE | INTRAVENOUS | Status: AC
  Filled 2019-12-21: qty 20

## 2019-12-21 MED ORDER — ACETAMINOPHEN 160 MG/5ML PO SOLN: 650.0000 mg | Freq: Once | ORAL | Status: AC

## 2019-12-21 MED ORDER — ROCURONIUM BROMIDE 10 MG/ML (PF) SYRINGE
PREFILLED_SYRINGE | INTRAVENOUS | Status: DC | PRN
  Administered 2019-12-21: 20 mg via INTRAVENOUS
  Administered 2019-12-21: 30 mg via INTRAVENOUS
  Administered 2019-12-21 (×2): 50 mg via INTRAVENOUS
  Administered 2019-12-21: 100 mg via INTRAVENOUS

## 2019-12-21 MED ORDER — DOCUSATE SODIUM 100 MG PO CAPS
200.0000 mg | ORAL_CAPSULE | Freq: Every day | ORAL | Status: DC
  Administered 2019-12-22 – 2019-12-26 (×4): 200 mg via ORAL
  Filled 2019-12-21 (×5): qty 2

## 2019-12-21 MED ORDER — SODIUM CHLORIDE 0.9 % IV SOLN
250.0000 mL | INTRAVENOUS | Status: DC
  Administered 2019-12-21: 50 mL via INTRAVENOUS

## 2019-12-21 MED ORDER — VANCOMYCIN HCL IN DEXTROSE 1-5 GM/200ML-% IV SOLN
1000.0000 mg | Freq: Once | INTRAVENOUS | Status: AC
  Administered 2019-12-21: 1000 mg via INTRAVENOUS
  Filled 2019-12-21: qty 200

## 2019-12-21 MED ORDER — DEXTROSE 50 % IV SOLN: 0.0000 mL | INTRAVENOUS | Status: DC | PRN

## 2019-12-21 MED ORDER — SODIUM CHLORIDE 0.9% FLUSH: 3.0000 mL | INTRAVENOUS | Status: DC | PRN

## 2019-12-21 MED ORDER — ONDANSETRON HCL 4 MG/2ML IJ SOLN: 4.0000 mg | Freq: Four times a day (QID) | INTRAMUSCULAR | Status: DC | PRN

## 2019-12-21 MED ORDER — LACTATED RINGERS IV SOLN: 500.0000 mL | Freq: Once | INTRAVENOUS | Status: DC | PRN

## 2019-12-21 MED ORDER — MIDAZOLAM HCL 2 MG/2ML IJ SOLN: 2.0000 mg | INTRAMUSCULAR | Status: DC | PRN

## 2019-12-21 MED ORDER — BISACODYL 5 MG PO TBEC
10.0000 mg | DELAYED_RELEASE_TABLET | Freq: Every day | ORAL | Status: DC
  Administered 2019-12-22 – 2019-12-26 (×5): 10 mg via ORAL
  Filled 2019-12-21 (×5): qty 2

## 2019-12-21 MED ORDER — MORPHINE SULFATE (PF) 2 MG/ML IV SOLN: 1.0000 mg | INTRAVENOUS | Status: DC | PRN

## 2019-12-21 MED ORDER — FENTANYL CITRATE (PF) 250 MCG/5ML IJ SOLN
INTRAMUSCULAR | Status: DC | PRN
Start: 2019-12-21 — End: 2019-12-21
  Administered 2019-12-21 (×2): 100 ug via INTRAVENOUS
  Administered 2019-12-21: 150 ug via INTRAVENOUS
  Administered 2019-12-21: 100 ug via INTRAVENOUS
  Administered 2019-12-21: 50 ug via INTRAVENOUS
  Administered 2019-12-21: 450 ug via INTRAVENOUS
  Administered 2019-12-21: 50 ug via INTRAVENOUS
  Administered 2019-12-21: 100 ug via INTRAVENOUS
  Administered 2019-12-21: 150 ug via INTRAVENOUS

## 2019-12-21 MED ORDER — FAMOTIDINE IN NACL 20-0.9 MG/50ML-% IV SOLN
20.0000 mg | Freq: Two times a day (BID) | INTRAVENOUS | Status: AC
  Administered 2019-12-21: 20 mg via INTRAVENOUS

## 2019-12-21 MED ORDER — ATORVASTATIN CALCIUM 80 MG PO TABS
80.0000 mg | ORAL_TABLET | Freq: Every day | ORAL | Status: DC
  Administered 2019-12-22 – 2019-12-26 (×5): 80 mg via ORAL
  Filled 2019-12-21 (×5): qty 1

## 2019-12-21 MED ORDER — MIDAZOLAM HCL (PF) 10 MG/2ML IJ SOLN
INTRAMUSCULAR | Status: AC
  Filled 2019-12-21: qty 2

## 2019-12-21 MED ORDER — PROPOFOL 10 MG/ML IV BOLUS
INTRAVENOUS | Status: AC
  Filled 2019-12-21: qty 40

## 2019-12-21 MED ORDER — SODIUM CHLORIDE 0.9 % IV SOLN
1.5000 g | Freq: Two times a day (BID) | INTRAVENOUS | Status: AC
  Administered 2019-12-21 – 2019-12-23 (×4): 1.5 g via INTRAVENOUS
  Filled 2019-12-21 (×4): qty 1.5

## 2019-12-21 MED ORDER — NITROGLYCERIN IN D5W 200-5 MCG/ML-% IV SOLN: 0.0000 ug/min | INTRAVENOUS | Status: DC

## 2019-12-21 MED ORDER — EPHEDRINE SULFATE-NACL 50-0.9 MG/10ML-% IV SOSY
PREFILLED_SYRINGE | INTRAVENOUS | Status: DC | PRN
  Administered 2019-12-21 (×2): 10 mg via INTRAVENOUS

## 2019-12-21 MED ORDER — ACETAMINOPHEN 500 MG PO TABS
1000.0000 mg | ORAL_TABLET | Freq: Four times a day (QID) | ORAL | Status: DC
  Administered 2019-12-21 – 2019-12-26 (×16): 1000 mg via ORAL
  Filled 2019-12-21 (×14): qty 2

## 2019-12-21 MED ORDER — PHENYLEPHRINE 40 MCG/ML (10ML) SYRINGE FOR IV PUSH (FOR BLOOD PRESSURE SUPPORT)
PREFILLED_SYRINGE | INTRAVENOUS | Status: AC
  Filled 2019-12-21: qty 10

## 2019-12-21 MED ORDER — PROTAMINE SULFATE 10 MG/ML IV SOLN
INTRAVENOUS | Status: DC | PRN
  Administered 2019-12-21: 180 mg via INTRAVENOUS

## 2019-12-21 MED ORDER — CHLORHEXIDINE GLUCONATE 0.12 % MT SOLN: 15.0000 mL | OROMUCOSAL | Status: AC

## 2019-12-21 MED ORDER — MAGNESIUM SULFATE 4 GM/100ML IV SOLN
4.0000 g | Freq: Once | INTRAVENOUS | Status: AC
  Administered 2019-12-21: 4 g via INTRAVENOUS

## 2019-12-21 MED ORDER — ACETAMINOPHEN 650 MG RE SUPP
650.0000 mg | Freq: Once | RECTAL | Status: AC
  Administered 2019-12-21: 650 mg via RECTAL

## 2019-12-21 MED ORDER — MIDAZOLAM HCL 5 MG/5ML IJ SOLN
INTRAMUSCULAR | Status: DC | PRN
  Administered 2019-12-21: 2 mg via INTRAVENOUS
  Administered 2019-12-21: 1 mg via INTRAVENOUS
  Administered 2019-12-21 (×2): 2 mg via INTRAVENOUS
  Administered 2019-12-21: 1 mg via INTRAVENOUS
  Administered 2019-12-21: 2 mg via INTRAVENOUS

## 2019-12-21 MED ORDER — HEPARIN SODIUM (PORCINE) 1000 UNIT/ML IJ SOLN
INTRAMUSCULAR | Status: DC | PRN
  Administered 2019-12-21: 18000 [IU] via INTRAVENOUS
  Administered 2019-12-21: 2000 [IU] via INTRAVENOUS

## 2019-12-21 MED ORDER — DEXMEDETOMIDINE (PRECEDEX) IN NS 20 MCG/5ML (4 MCG/ML) IV SYRINGE
PREFILLED_SYRINGE | INTRAVENOUS | Status: AC
  Filled 2019-12-21: qty 5

## 2019-12-21 MED ORDER — POTASSIUM CHLORIDE 10 MEQ/50ML IV SOLN
10.0000 meq | Freq: Once | INTRAVENOUS | Status: AC
  Administered 2019-12-21: 10 meq via INTRAVENOUS

## 2019-12-21 MED ORDER — HEMOSTATIC AGENTS (NO CHARGE) OPTIME
TOPICAL | Status: DC | PRN
  Administered 2019-12-21 (×5): 1 via TOPICAL

## 2019-12-21 MED ORDER — METOPROLOL TARTRATE 12.5 MG HALF TABLET
12.5000 mg | ORAL_TABLET | Freq: Two times a day (BID) | ORAL | Status: DC
  Administered 2019-12-22 – 2019-12-25 (×7): 12.5 mg via ORAL
  Filled 2019-12-21 (×7): qty 1

## 2019-12-21 MED ORDER — TRAMADOL HCL 50 MG PO TABS
50.0000 mg | ORAL_TABLET | ORAL | Status: DC | PRN
  Administered 2019-12-21 – 2019-12-22 (×4): 100 mg via ORAL
  Administered 2019-12-23 (×2): 50 mg via ORAL
  Filled 2019-12-21 (×3): qty 2
  Filled 2019-12-21 (×2): qty 1
  Filled 2019-12-21: qty 2

## 2019-12-21 MED ORDER — ALBUMIN HUMAN 5 % IV SOLN: INTRAVENOUS | Status: DC | PRN

## 2019-12-21 MED ORDER — LACTATED RINGERS IV SOLN: INTRAVENOUS | Status: DC

## 2019-12-21 MED ORDER — PROPOFOL 10 MG/ML IV BOLUS
INTRAVENOUS | Status: DC | PRN
  Administered 2019-12-21: 50 mg via INTRAVENOUS

## 2019-12-21 MED ORDER — PANTOPRAZOLE SODIUM 40 MG PO TBEC
40.0000 mg | DELAYED_RELEASE_TABLET | Freq: Every day | ORAL | Status: DC
  Administered 2019-12-23 – 2019-12-26 (×4): 40 mg via ORAL
  Filled 2019-12-21 (×4): qty 1

## 2019-12-21 MED ORDER — PHENYLEPHRINE HCL-NACL 20-0.9 MG/250ML-% IV SOLN
0.0000 ug/min | INTRAVENOUS | Status: DC
  Administered 2019-12-21: 55 ug/min via INTRAVENOUS
  Filled 2019-12-21: qty 250

## 2019-12-21 MED ORDER — PROTAMINE SULFATE 10 MG/ML IV SOLN
INTRAVENOUS | Status: AC
  Filled 2019-12-21: qty 25

## 2019-12-21 MED ORDER — DEXMEDETOMIDINE HCL IN NACL 400 MCG/100ML IV SOLN: 0.0000 ug/kg/h | INTRAVENOUS | Status: DC

## 2019-12-21 MED ORDER — ACETAMINOPHEN 160 MG/5ML PO SOLN: 1000.0000 mg | Freq: Four times a day (QID) | ORAL | Status: DC

## 2019-12-21 MED ORDER — PLASMA-LYTE 148 IV SOLN
INTRAVENOUS | Status: DC | PRN
  Administered 2019-12-21: 500 mL

## 2019-12-21 MED ORDER — 0.9 % SODIUM CHLORIDE (POUR BTL) OPTIME
TOPICAL | Status: DC | PRN
  Administered 2019-12-21: 5000 mL

## 2019-12-21 MED ORDER — SODIUM CHLORIDE 0.9 % IV SOLN: INTRAVENOUS | Status: DC

## 2019-12-21 MED ORDER — ALBUMIN HUMAN 5 % IV SOLN
250.0000 mL | INTRAVENOUS | Status: AC | PRN
  Administered 2019-12-21 – 2019-12-22 (×4): 12.5 g via INTRAVENOUS
  Filled 2019-12-21 (×2): qty 250

## 2019-12-21 MED ORDER — FENTANYL CITRATE (PF) 250 MCG/5ML IJ SOLN
INTRAMUSCULAR | Status: AC
  Filled 2019-12-21: qty 25

## 2019-12-21 MED ORDER — SODIUM CHLORIDE (PF) 0.9 % IJ SOLN
INTRAMUSCULAR | Status: AC
  Filled 2019-12-21: qty 10

## 2019-12-21 MED ORDER — METOPROLOL TARTRATE 25 MG/10 ML ORAL SUSPENSION
12.5000 mg | Freq: Two times a day (BID) | ORAL | Status: DC
  Filled 2019-12-21 (×3): qty 5

## 2019-12-21 MED ORDER — ROCURONIUM BROMIDE 10 MG/ML (PF) SYRINGE
PREFILLED_SYRINGE | INTRAVENOUS | Status: AC
  Filled 2019-12-21: qty 10

## 2019-12-21 MED ORDER — METOPROLOL TARTRATE 5 MG/5ML IV SOLN: 2.5000 mg | INTRAVENOUS | Status: DC | PRN

## 2019-12-21 MED ORDER — SODIUM CHLORIDE 0.45 % IV SOLN: INTRAVENOUS | Status: DC | PRN

## 2019-12-21 MED ORDER — BISACODYL 10 MG RE SUPP: 10.0000 mg | Freq: Every day | RECTAL | Status: DC

## 2019-12-21 MED ORDER — POTASSIUM CHLORIDE 10 MEQ/50ML IV SOLN
10.0000 meq | INTRAVENOUS | Status: AC
  Administered 2019-12-21 (×3): 10 meq via INTRAVENOUS
  Filled 2019-12-21: qty 50

## 2019-12-21 SURGICAL SUPPLY — 113 items
ADAPTER CARDIO PERF ANTE/RETRO (ADAPTER) ×5 IMPLANT
ADH SKN CLS APL DERMABOND .7 (GAUZE/BANDAGES/DRESSINGS) ×3
ADH SRG 12 PREFL SYR 3 SPRDR (MISCELLANEOUS)
ADPR PRFSN 84XANTGRD RTRGD (ADAPTER) ×3
APL SWBSTK 6 STRL LF DISP (MISCELLANEOUS)
APPLICATOR COTTON TIP 6 STRL (MISCELLANEOUS) IMPLANT
APPLICATOR COTTON TIP 6IN STRL (MISCELLANEOUS) IMPLANT
BAG DECANTER FOR FLEXI CONT (MISCELLANEOUS) ×5 IMPLANT
BLADE CLIPPER SURG (BLADE) ×5 IMPLANT
BLADE STERNUM SYSTEM 6 (BLADE) ×5 IMPLANT
BLADE SURG 15 STRL LF DISP TIS (BLADE) ×3 IMPLANT
BLADE SURG 15 STRL SS (BLADE) ×5
BNDG CMPR MED 10X6 ELC LF (GAUZE/BANDAGES/DRESSINGS) ×3
BNDG ELASTIC 4X5.8 VLCR STR LF (GAUZE/BANDAGES/DRESSINGS) ×5 IMPLANT
BNDG ELASTIC 6X10 VLCR STRL LF (GAUZE/BANDAGES/DRESSINGS) ×2 IMPLANT
BNDG ELASTIC 6X5.8 VLCR STR LF (GAUZE/BANDAGES/DRESSINGS) ×5 IMPLANT
BNDG GAUZE ELAST 4 BULKY (GAUZE/BANDAGES/DRESSINGS) ×5 IMPLANT
CANISTER SUCT 3000ML PPV (MISCELLANEOUS) ×5 IMPLANT
CANNULA EZ GLIDE AORTIC 21FR (CANNULA) ×5 IMPLANT
CANNULA GUNDRY RCSP 15FR (MISCELLANEOUS) ×5 IMPLANT
CATH CPB KIT HENDRICKSON (MISCELLANEOUS) ×5 IMPLANT
CATH HEART VENT LEFT (CATHETERS) ×3 IMPLANT
CATH ROBINSON RED A/P 18FR (CATHETERS) ×12 IMPLANT
CATH THORACIC 36FR (CATHETERS) ×5 IMPLANT
CATH THORACIC 36FR RT ANG (CATHETERS) ×10 IMPLANT
CLIP FOGARTY SPRING 6M (CLIP) ×2 IMPLANT
CLIP VESOCCLUDE MED 24/CT (CLIP) IMPLANT
CLIP VESOCCLUDE SM WIDE 24/CT (CLIP) ×4 IMPLANT
CNTNR URN SCR LID CUP LEK RST (MISCELLANEOUS) IMPLANT
CONT SPEC 4OZ STRL OR WHT (MISCELLANEOUS) ×5
COVER MAYO STAND STRL (DRAPES) ×2 IMPLANT
DERMABOND ADVANCED (GAUZE/BANDAGES/DRESSINGS) ×2
DERMABOND ADVANCED .7 DNX12 (GAUZE/BANDAGES/DRESSINGS) IMPLANT
DRAPE CARDIOVASCULAR INCISE (DRAPES) ×5
DRAPE SLUSH/WARMER DISC (DRAPES) ×5 IMPLANT
DRAPE SRG 135X102X78XABS (DRAPES) ×3 IMPLANT
DRSG COVADERM 4X14 (GAUZE/BANDAGES/DRESSINGS) ×5 IMPLANT
ELECT REM PT RETURN 9FT ADLT (ELECTROSURGICAL) ×10
ELECTRODE REM PT RTRN 9FT ADLT (ELECTROSURGICAL) ×6 IMPLANT
FELT TEFLON 1X6 (MISCELLANEOUS) ×10 IMPLANT
GAUZE SPONGE 4X4 12PLY STRL (GAUZE/BANDAGES/DRESSINGS) ×10 IMPLANT
GLOVE BIO SURGEON STRL SZ 6.5 (GLOVE) ×2 IMPLANT
GLOVE BIO SURGEONS STRL SZ 6.5 (GLOVE) ×2
GLOVE SURG SIGNA 7.5 PF LTX (GLOVE) ×15 IMPLANT
GOWN STRL REUS W/ TWL LRG LVL3 (GOWN DISPOSABLE) ×12 IMPLANT
GOWN STRL REUS W/ TWL XL LVL3 (GOWN DISPOSABLE) ×6 IMPLANT
GOWN STRL REUS W/TWL LRG LVL3 (GOWN DISPOSABLE) ×45
GOWN STRL REUS W/TWL XL LVL3 (GOWN DISPOSABLE) ×10
HEMOSTAT POWDER SURGIFOAM 1G (HEMOSTASIS) ×15 IMPLANT
HEMOSTAT SURGICEL 2X14 (HEMOSTASIS) ×5 IMPLANT
INSERT FOGARTY XLG (MISCELLANEOUS) IMPLANT
KIT BASIN OR (CUSTOM PROCEDURE TRAY) ×5 IMPLANT
KIT SUCTION CATH 14FR (SUCTIONS) ×10 IMPLANT
KIT TURNOVER KIT B (KITS) ×5 IMPLANT
KIT VASOVIEW HEMOPRO 2 VH 4000 (KITS) ×5 IMPLANT
LINE VENT (MISCELLANEOUS) ×2 IMPLANT
MARKER GRAFT CORONARY BYPASS (MISCELLANEOUS) ×17 IMPLANT
NS IRRIG 1000ML POUR BTL (IV SOLUTION) ×25 IMPLANT
PACK E OPEN HEART (SUTURE) ×5 IMPLANT
PACK OPEN HEART (CUSTOM PROCEDURE TRAY) ×5 IMPLANT
PAD ARMBOARD 7.5X6 YLW CONV (MISCELLANEOUS) ×10 IMPLANT
PAD ELECT DEFIB RADIOL ZOLL (MISCELLANEOUS) ×5 IMPLANT
PENCIL BUTTON HOLSTER BLD 10FT (ELECTRODE) ×5 IMPLANT
POSITIONER HEAD DONUT 9IN (MISCELLANEOUS) ×5 IMPLANT
PUNCH AORTIC ROTATE 4.0MM (MISCELLANEOUS) ×2 IMPLANT
PUNCH AORTIC ROTATE 4.5MM 8IN (MISCELLANEOUS) IMPLANT
PUNCH AORTIC ROTATE 5MM 8IN (MISCELLANEOUS) IMPLANT
SET CARDIOPLEGIA MPS 5001102 (MISCELLANEOUS) ×2 IMPLANT
SUPPORT HEART JANKE-BARRON (MISCELLANEOUS) ×5 IMPLANT
SUT BONE WAX W31G (SUTURE) ×5 IMPLANT
SUT ETHIBON 2 0 V 52N 30 (SUTURE) ×10 IMPLANT
SUT ETHIBON EXCEL 2-0 V-5 (SUTURE) IMPLANT
SUT ETHIBOND 2 0 SH (SUTURE) ×5
SUT ETHIBOND 2 0 SH 36X2 (SUTURE) ×3 IMPLANT
SUT ETHIBOND 2 0 V4 (SUTURE) IMPLANT
SUT ETHIBOND 2 0V4 GREEN (SUTURE) IMPLANT
SUT ETHIBOND 4 0 RB 1 (SUTURE) IMPLANT
SUT ETHIBOND V-5 VALVE (SUTURE) IMPLANT
SUT MNCRL AB 4-0 PS2 18 (SUTURE) IMPLANT
SUT PROLENE 3 0 SH 1 (SUTURE) IMPLANT
SUT PROLENE 3 0 SH DA (SUTURE) ×5 IMPLANT
SUT PROLENE 4 0 RB 1 (SUTURE) ×30
SUT PROLENE 4 0 SH DA (SUTURE) IMPLANT
SUT PROLENE 4-0 RB1 .5 CRCL 36 (SUTURE) ×6 IMPLANT
SUT PROLENE 6 0 C 1 30 (SUTURE) ×10 IMPLANT
SUT PROLENE 7 0 BV1 MDA (SUTURE) ×7 IMPLANT
SUT PROLENE 8 0 BV175 6 (SUTURE) ×2 IMPLANT
SUT SILK  1 MH (SUTURE) ×10
SUT SILK 1 MH (SUTURE) ×3 IMPLANT
SUT STEEL 6MS V (SUTURE) ×5 IMPLANT
SUT STEEL STERNAL CCS#1 18IN (SUTURE) IMPLANT
SUT STEEL SZ 6 DBL 3X14 BALL (SUTURE) ×5 IMPLANT
SUT VIC AB 1 CTX 36 (SUTURE) ×15
SUT VIC AB 1 CTX36XBRD ANBCTR (SUTURE) ×6 IMPLANT
SUT VIC AB 2-0 CT1 27 (SUTURE)
SUT VIC AB 2-0 CT1 36 (SUTURE) ×2 IMPLANT
SUT VIC AB 2-0 CT1 TAPERPNT 27 (SUTURE) IMPLANT
SUT VIC AB 2-0 CTX 27 (SUTURE) IMPLANT
SUT VIC AB 3-0 SH 27 (SUTURE)
SUT VIC AB 3-0 SH 27X BRD (SUTURE) IMPLANT
SUT VIC AB 3-0 X1 27 (SUTURE) ×2 IMPLANT
SUT VICRYL 4-0 PS2 18IN ABS (SUTURE) IMPLANT
SYR 10ML KIT SKIN ADHESIVE (MISCELLANEOUS) IMPLANT
SYSTEM SAHARA CHEST DRAIN ATS (WOUND CARE) ×5 IMPLANT
TAPE CLOTH SURG 4X10 WHT LF (GAUZE/BANDAGES/DRESSINGS) ×2 IMPLANT
TOWEL GREEN STERILE (TOWEL DISPOSABLE) ×5 IMPLANT
TOWEL GREEN STERILE FF (TOWEL DISPOSABLE) ×5 IMPLANT
TRAY FOLEY SLVR 16FR TEMP STAT (SET/KITS/TRAYS/PACK) ×5 IMPLANT
TUBING LAP HI FLOW INSUFFLATIO (TUBING) ×5 IMPLANT
UNDERPAD 30X36 HEAVY ABSORB (UNDERPADS AND DIAPERS) ×5 IMPLANT
VALVE AORTIC SZ23 INSP/RESIL (Prosthesis & Implant Heart) ×2 IMPLANT
VENT LEFT HEART 12002 (CATHETERS) ×5
WATER STERILE IRR 1000ML POUR (IV SOLUTION) ×10 IMPLANT

## 2019-12-21 NOTE — Interval H&P Note (Signed)
History and Physical Interval Note:  12/21/2019 7:45 AM  Steven Lawrence  has presented today for surgery, with the diagnosis of CAD.  The various methods of treatment have been discussed with the patient and family. After consideration of risks, benefits and other options for treatment, the patient has consented to  Procedure(s): CORONARY ARTERY BYPASS GRAFTING (CABG) (N/A) AORTIC VALVE REPLACEMENT (AVR) (N/A) TRANSESOPHAGEAL ECHOCARDIOGRAM (TEE) (N/A) as a surgical intervention.  The patient's history has been reviewed, patient examined, no change in status, stable for surgery.  I have reviewed the patient's chart and labs.  Questions were answered to the patient's satisfaction.     Melrose Nakayama

## 2019-12-21 NOTE — Anesthesia Procedure Notes (Signed)
Procedure Name: Intubation Date/Time: 12/21/2019 8:42 AM Performed by: Trinna Post., CRNA Pre-anesthesia Checklist: Patient identified, Emergency Drugs available, Suction available, Patient being monitored and Timeout performed Patient Re-evaluated:Patient Re-evaluated prior to induction Oxygen Delivery Method: Circle system utilized Preoxygenation: Pre-oxygenation with 100% oxygen Induction Type: IV induction Ventilation: Mask ventilation without difficulty Laryngoscope Size: Mac and 4 Grade View: Grade I Tube type: Oral Tube size: 8.0 mm Number of attempts: 1 Airway Equipment and Method: Stylet Placement Confirmation: ETT inserted through vocal cords under direct vision,  positive ETCO2 and breath sounds checked- equal and bilateral Secured at: 22 cm Tube secured with: Tape Dental Injury: Teeth and Oropharynx as per pre-operative assessment

## 2019-12-21 NOTE — Anesthesia Procedure Notes (Signed)
Arterial Line Insertion Start/End10/08/2019 7:00 AM Performed by: Trinna Post., CRNA, CRNA  Patient location: Pre-op. Preanesthetic checklist: patient identified, IV checked, site marked, risks and benefits discussed, surgical consent, monitors and equipment checked, pre-op evaluation, timeout performed and anesthesia consent Lidocaine 1% used for infiltration and patient sedated Left, radial was placed Catheter size: 20 G Hand hygiene performed  and maximum sterile barriers used   Attempts: 1 Procedure performed without using ultrasound guided technique. Following insertion, dressing applied and Biopatch. Post procedure assessment: normal  Patient tolerated the procedure well with no immediate complications.

## 2019-12-21 NOTE — Brief Op Note (Signed)
12/16/2019 - 12/21/2019  2:53 PM  PATIENT:  Steven Lawrence  69 y.o. male  PRE-OPERATIVE DIAGNOSIS:  CAD  POST-OPERATIVE DIAGNOSIS:  CAD  PROCEDURE:  Procedure(s): CORONARY ARTERY BYPASS GRAFTING TIMES FIVE USING LEFT INTERNAL [employer] ARTERY AND ENDOSCOPICALLY HARVESTED RIGHT GREATER SAPHENOUS VEIN. (N/A) AORTIC VALVE REPLACEMENT WITH INSPIRIS RESILIA AORTIC VALVE SIZE 23MM (N/A) TRANSESOPHAGEAL ECHOCARDIOGRAM (TEE) (N/A) ENDOVEIN HARVEST OF RIGHT GREATER SAPHENOUS VEIN LIMA-LAD SEQ SVG-OM1-OM2 SVG-PD SVG-DIAG  SURGEON:  Surgeon(s) and Role:    * Melrose Nakayama, MD - Primary  PHYSICIAN ASSISTANT: Virgilio Broadhead PA-C  ASSISTANTS: STAFF   ANESTHESIA:   general  EBL:  819 mL   BLOOD ADMINISTERED:none  DRAINS: LEFT PLEURAL AND MEDIASTINAL CHEST TUBES   LOCAL MEDICATIONS USED:  NONE  SPECIMEN:  Source of Specimen:  AORTIC VALVE LEAFLETS  DISPOSITION OF SPECIMEN:  PATHOLOGY  COUNTS:  YES  TOURNIQUET:  * No tourniquets in log *  DICTATION: .Other Dictation: Dictation Number PENDING  PLAN OF CARE: Admit to inpatient   PATIENT DISPOSITION:  ICU - intubated and hemodynamically stable.   Delay start of Pharmacological VTE agent (>24hrs) due to surgical blood loss or risk of bleeding: yes  COMPLICATIONS: NO KNOWN

## 2019-12-21 NOTE — Anesthesia Procedure Notes (Signed)
Central Venous Catheter Insertion Performed by: Roderic Palau, MD, anesthesiologist Start/End10/08/2019 7:40 AM, 12/21/2019 7:55 AM Patient location: Pre-op. Preanesthetic checklist: patient identified, IV checked, site marked, risks and benefits discussed, surgical consent, monitors and equipment checked, pre-op evaluation, timeout performed and anesthesia consent Position: Trendelenburg Lidocaine 1% used for infiltration and patient sedated Hand hygiene performed , maximum sterile barriers used  and Seldinger technique used Catheter size: 9 Fr Total catheter length 10. Central line was placed.MAC introducer Procedure performed using ultrasound guided technique. Ultrasound Notes:anatomy identified, needle tip was noted to be adjacent to the nerve/plexus identified, no ultrasound evidence of intravascular and/or intraneural injection and image(s) printed for medical record Attempts: 1 Following insertion, line sutured, dressing applied and Biopatch. Post procedure assessment: blood return through all ports, free fluid flow and no air  Patient tolerated the procedure well with no immediate complications.

## 2019-12-21 NOTE — Transfer of Care (Signed)
Immediate Anesthesia Transfer of Care Note  Patient: Steven Lawrence  Procedure(s) Performed: CORONARY ARTERY BYPASS GRAFTING TIMES FIVE USING LEFT INTERNAL [employer] ARTERY AND ENDOSCOPICALLY HARVESTED RIGHT GREATER SAPHENOUS VEIN. (N/A Chest) AORTIC VALVE REPLACEMENT WITH INSPIRIS RESILIA AORTIC VALVE SIZE 23MM (N/A Chest) TRANSESOPHAGEAL ECHOCARDIOGRAM (TEE) (N/A Esophagus) ENDOVEIN HARVEST OF RIGHT GREATER SAPHENOUS VEIN (Leg Upper)  Patient Location: PACU and ICU  Anesthesia Type:General  Level of Consciousness: sedated and Patient remains intubated per anesthesia plan  Airway & Oxygen Therapy: Patient remains intubated per anesthesia plan and Patient placed on Ventilator (see vital sign flow sheet for setting)  Post-op Assessment: Report given to RN and Post -op Vital signs reviewed and stable  Post vital signs: Reviewed and stable  Last Vitals:  Vitals Value Taken Time  BP    Temp 35.5 C 12/21/19 1453  Pulse 66 12/21/19 1453  Resp 12 12/21/19 1453  SpO2 100 % 12/21/19 1453  Vitals shown include unvalidated device data.  Last Pain:  Vitals:   12/21/19 0434  TempSrc: Oral  PainSc:          Complications: No complications documented.

## 2019-12-21 NOTE — Procedures (Signed)
Extubation Procedure Note  Patient Details:   Name: IBRAHEEM VORIS DOB: [birth year] MRN: [id number]   Airway Documentation:    Vent end date: 12/21/19 Vent end time: 1819   Evaluation  O2 sats: stable throughout Complications: No apparent complications Patient did tolerate procedure well. Bilateral Breath Sounds: Clear, Diminished  NIF-30 FVC-1.0L Placed on 2l/min  Incentive spirometer 836ml   Yes  Revonda Standard 12/21/2019, 6:20 PM

## 2019-12-21 NOTE — Op Note (Signed)
NAME: Steven Lawrence, [facility] MEDICAL RECORD FU:[id number] ACCOUNT [id number] DATE OF BIRTH:1950/05/19 FACILITY: [facility] LOCATION: [facility] PHYSICIAN:Glendon Dunwoody C. Oleva Koo, MD  OPERATIVE REPORT  DATE OF PROCEDURE:  12/21/2019  PREOPERATIVE DIAGNOSIS:  Severe 3-vessel coronary artery disease, status post non-ST elevation myocardial infarction and moderately severe aortic stenosis.  POSTOPERATIVE DIAGNOSIS:  Severe 3-vessel coronary artery disease, status post non-ST elevation myocardial infarction and moderately severe aortic stenosis.  PROCEDURE:   Median sternotomy.  Extracorporeal circulation Coronary artery bypass grafting x 5  Left internal mammary artery to left anterior descending,  Saphenous vein graft to first diagonal,  Sequential saphenous vein graft to obtuse marginals 2 and 3,  Saphenous vein graft to posterior descending. Aortic valve replacement with 23 mm Inspiris Resilia bovine pericardial valve, serial #[id number]. Endoscopic vein harvest right leg.  SURGEON:  Modesto Charon, MD  ASSISTANT:  Jadene Pierini, PA  ANESTHESIA:  General.  FINDINGS:  Transesophageal echocardiography revealed preserved left ventricular function, moderately severe aortic stenosis with a valve area estimated at 0.8 cm2. Good function of the prosthetic valve with no paravalvular leaks and preserved left  ventricular function post-bypass.  PDA poor quality target.  OM2 fair quality.  Remaining targets good quality. Good quality conduits.  CLINICAL NOTE:  [name] is a 69 year old gentleman with multiple cardiac risk factors who presented with unstable chest pain.  He ruled in for a non-ST elevation MI.  An echocardiogram showed moderately severe aortic stenosis and cardiac  catheterization revealed 3-vessel coronary artery disease.  He was referred for coronary artery bypass grafting and aortic valve replacement.  The indications, risks, benefits, and alternatives were discussed in detail with the patient.   He understood  and accepted the risks and agreed to proceed.  DESCRIPTION OF PROCEDURE:  [name] was brought to the preoperative holding area on 12/21/2019.  Anesthesia placed a Swan-Ganz catheter and an arterial blood pressure monitoring line.  He was taken to the Operating Room, anesthetized and intubated.   Intravenous antibiotics were administered.  Transesophageal echocardiography was performed by Dr. Oren Bracket.  Please see his separately dictated note for full details.  Findings as noted above.  The chest, abdomen and legs were prepped and draped  in the usual sterile fashion.  A timeout was performed.  An incision was made in the medial aspect of the right leg at the level of the knee.  The greater saphenous vein then was harvested from mid-calf to groin endoscopically.  The saphenous vein was of good quality.  Simultaneously,  a median sternotomy was performed and the left internal mammary artery was harvested using standard technique.  It also was a good quality vessel with excellent flow when divided distally.  Two thousand units of heparin was administered during the  vessel harvest.  The remainder of the full heparin dose was given prior to opening the pericardium.  After harvesting the conduits, the pericardium was opened.  The ascending aorta was inspected.  There was no evidence of atherosclerotic disease.  The aorta was cannulated via concentric 2-0 Ethibond pledgeted pursestring sutures.  A dual stage venous  cannula was placed via a pursestring suture in the right atrial appendage.  After confirming adequate anticoagulation with ACT measurement, cardiopulmonary bypass was initiated.  Flows were maintained per protocol.  The patient was cooled to 30 degrees Celsius.  The  coronary arteries were inspected and anastomotic sites were chosen.  The conduits were inspected and cut to length.  A left ventricular vent was placed via a pursestring suture in  the right superior pulmonary  vein.  A retrograde cardioplegia cannula was  placed via a pursestring suture in the right atrium and directed into the coronary sinus and a cardioplegia cannula was placed in the ascending aorta.  The aorta was crossclamped.  The left ventricle was emptied via the aortic root vent.  Cardiac arrest was achieved with a combination of cold antegrade and retrograde blood cardioplegia and topical iced saline.  Initial 500 mL of cardioplegia was  administered antegrade.  There was a rapid diastolic arrest, 742 mL of retrograde cardioplegia was administered and there was septal cooling to 7 degrees Celsius.  A reversed saphenous vein graft was placed end-to-side to the posterior descending branch of the right coronary.  This vessel had significant plaque throughout, particularly proximal to the anastomosis.  It was a poor quality target vessel.  The vein was of  good quality.  The end-to-side anastomosis was performed with a running 7-0 Prolene suture.  Probe did pass distally after the completion of the anastomosis and all anastomoses were probed at their completion.  Cardioplegia was administered down the  graft and there was satisfactory flow and good hemostasis.  Next, a reversed saphenous vein graft was placed end-to-side to the large diagonal branch.  This branch was heavily calcified proximally, more so than was appreciated on catheterization.  There was about a 70% stenosis more distally.  It was grafted  relatively distally.  It was a 1.5 mm good quality target at the site of anastomosis.  The vein graft was of good quality.  It was of larger caliber than the other 2 veins.  Again, the anastomosis was performed with a running 7-0 Prolene suture and there  was good flow and good hemostasis.  After giving additional cardioplegia retrograde, the heart was elevated and the posterolateral wall was exposed.  The OM1 was too small to graft.  The OM2 and OM3 were both suitable for grafting.  The OM2 was a  1.5 mm vessel.  The OM3 was a 2 mm vessel.    Both were good quality.  A side-to-side anastomosis was performed to OM 2 and end-to-side to OM3.  There was excellent flow through the graft and good hemostasis with cardioplegia administration.  The left internal mammary artery was brought through a window in the pericardium.  The distal end was bevelled.  It was anastomosed end-to-side to the distal LAD.  The LAD was a 2 mm good quality target.  The mammary was a 1.5 mm good quality conduit.   The end-to-side anastomosis was performed with a running 8-0 Prolene suture.  At the completion of the anastomosis, the bulldog clamp was removed.  Rapid septal rewarming was noted.  The bulldog clamp was replaced.  The mammary pedicle was tacked to the  epicardial surface of the heart with 6-0 Prolene sutures.  Additional cardioplegia was administered via the retrograde cannula.  An aortotomy was performed and extended into the noncoronary sinus.  The aortic valve was inspected.  It was a trileaflet valve.  There was heavy calcification of the noncoronary cusp and  milder calcification of the left and right cusps.  The cusps were excised.  Annular calcium was debrided.  This was relatively mild calcification.  Care was taken to contain all calcific debris.  The annulus was copiously irrigated with iced saline.   It sized for a 23 mm Inspiris Resilia valve.  While the valve was being prepared per manufacturer's recommendations, 2-0 Ethibond horizontal mattress sutures with subannular  pledgets were placed circumferentially around the annulus; 15 sutures were  utilized.  Additional cardioplegia was administered at 20 minute intervals throughout the aortic valve portion of the procedure.  Carbon dioxide was insufflated into the operative field, beginning with the initiation of bypass and continuing until after  the proximal anastomoses were completed.  The sutures were  passed through the sewing ring of the valve.  The  valve was lowered into place and the sutures were sequentially tied.  The valve seated nicely.  There was no impingement of the coronary ostia.   There was heavy calcification around the right coronary ostium.  The annulus was probed with a fine-tipped right angle and no gaps were noted.  Rewarming was begun.  The aortotomy was closed in 2 layers.  The first layer was a running 4-0 Prolene  horizontal mattress suture, followed by running 4-0 Prolene simple suture.  The cardioplegia cannula was removed from the ascending aorta.  The proximal vein graft anastomoses were performed to 4.0 mm punch aortotomies with running 6-0 Prolene sutures.  At the completion of the final proximal anastomosis, a warm dose of  retrograde cardioplegia was administered.  Deairing was performed.  Lidocaine was administered.  The aortic root was de-aired and the aortic crossclamp was removed.  The total crossclamp time was 129 minutes.  The patient did not require defibrillation,  but initially was in heart block with a slow ventricular escape.  After rewarming was completed, all proximal and distal anastomoses and the aortotomy were inspected for hemostasis.  The retrograde cardioplegia cannula was removed.  After inspecting for air, the left ventricular vent was removed and there was no air  present on echocardiogram.  Epicardial pacing wires were placed on the right ventricle and right atrium.  When the patient had rewarmed to a core temperature of 37 degrees Celsius, he was weaned from cardiopulmonary bypass on the first attempt.  He was  DDD paced to 80 beats per minute and on no inotropic support at the time of separation from bypass.  Total bypass time was 172 minutes.  Initial cardiac index was greater than 2 L per minute per meter squared.  The post-bypass transesophageal  echocardiography showed no change in left ventricular function, other than some mild septal dyskinesis associated with pacing.  There was good  function of the prosthetic valve with no paravalvular leaks.  A test dose of protamine was administered and was well tolerated.  The atrial and aortic cannulae were removed.  The remainder of the protamine was administered without incident.  The chest was irrigated with warm saline.  Hemostasis was achieved.  Left  pleural and mediastinal chest tubes were placed through separate subcostal incisions.  The pericardium was reapproximated over the aortic root and base of the heart with interrupted 3-0 silk sutures.  It came together easily without tension or kinking  the underlying grafts.  The sternum was closed with a combination of single and double heavy gauge stainless steel wires.  Pectoralis fascia, subcutaneous tissue and skin were closed in standard fashion.  All sponge, needle and instrument counts were  correct at the end of the procedure.  The TEG assessment showed platelet and fibrinogen deficits.  Those will be corrected with the patient arrives in the ICU.  He was transported from the operating room to the Surgical Intensive Care Unit, intubated and  in good condition.  VN/NUANCE  D:12/21/2019 T:12/21/2019 JOB:[id number]

## 2019-12-21 NOTE — Progress Notes (Signed)
[facility] BRIEF SICU PROGRESS NOTE  Day of Surgery  S/P Procedure(s) (LRB): CORONARY ARTERY BYPASS GRAFTING TIMES FIVE USING LEFT INTERNAL [employer] ARTERY AND ENDOSCOPICALLY HARVESTED RIGHT GREATER SAPHENOUS VEIN. (N/A) AORTIC VALVE REPLACEMENT WITH INSPIRIS RESILIA AORTIC VALVE SIZE 23MM (N/A) TRANSESOPHAGEAL ECHOCARDIOGRAM (TEE) (N/A) ENDOVEIN HARVEST OF RIGHT GREATER SAPHENOUS VEIN   Sedated on vent AAI paced w/ stable hemodynamics O2 sats 100% Chest tube output low UOP adequate Labs okay  Plan: Continue routine early postop  Rexene Alberts, MD 12/21/2019 4:52 PM

## 2019-12-21 NOTE — Anesthesia Postprocedure Evaluation (Signed)
Anesthesia Post Note  Patient: Steven Lawrence  Procedure(s) Performed: CORONARY ARTERY BYPASS GRAFTING TIMES FIVE USING LEFT INTERNAL [employer] ARTERY AND ENDOSCOPICALLY HARVESTED RIGHT GREATER SAPHENOUS VEIN. (N/A Chest) AORTIC VALVE REPLACEMENT WITH INSPIRIS RESILIA AORTIC VALVE SIZE 23MM (N/A Chest) TRANSESOPHAGEAL ECHOCARDIOGRAM (TEE) (N/A Esophagus) ENDOVEIN HARVEST OF RIGHT GREATER SAPHENOUS VEIN (Leg Upper)     Patient location during evaluation: SICU Anesthesia Type: General Level of consciousness: sedated Pain management: pain level controlled Vital Signs Assessment: post-procedure vital signs reviewed and stable Respiratory status: patient remains intubated per anesthesia plan Cardiovascular status: stable Postop Assessment: no apparent nausea or vomiting Anesthetic complications: no   No complications documented.  Last Vitals:  Vitals:   12/21/19 1500 12/21/19 1551  BP: 134/66   Pulse: 72 65  Resp: 12 12  Temp: (!) 35.5 C (!) 36.2 C  SpO2: 100% 100%    Last Pain:  Vitals:   12/21/19 1500  TempSrc: Core (Comment)  PainSc:                  Merril Nagy,W. EDMOND

## 2019-12-21 NOTE — Anesthesia Procedure Notes (Signed)
Central Venous Catheter Insertion Performed by: Roderic Palau, MD, anesthesiologist Start/End10/08/2019 7:40 AM, 12/21/2019 7:55 AM Patient location: Pre-op. Preanesthetic checklist: patient identified, IV checked, site marked, risks and benefits discussed, surgical consent, monitors and equipment checked, pre-op evaluation, timeout performed and anesthesia consent Hand hygiene performed  and maximum sterile barriers used  PA cath was placed.Swan type:thermodilution PA Cath depth:50 Procedure performed without using ultrasound guided technique. Attempts: 1 Patient tolerated the procedure well with no immediate complications.

## 2019-12-22 ENCOUNTER — Encounter (HOSPITAL_COMMUNITY): Payer: Self-pay | Admitting: Thoracic Surgery (Cardiothoracic Vascular Surgery)

## 2019-12-22 ENCOUNTER — Inpatient Hospital Stay (HOSPITAL_COMMUNITY): Payer: Medicare Other

## 2019-12-22 ENCOUNTER — Other Ambulatory Visit: Payer: Self-pay | Admitting: Medical

## 2019-12-22 DIAGNOSIS — I350 Nonrheumatic aortic (valve) stenosis: Secondary | ICD-10-CM

## 2019-12-22 DIAGNOSIS — Z952 Presence of prosthetic heart valve: Secondary | ICD-10-CM

## 2019-12-22 LAB — BASIC METABOLIC PANEL
Anion gap: 8 (ref 5–15)
Anion gap: 8 (ref 5–15)
BUN: 14 mg/dL (ref 8–23)
BUN: 18 mg/dL (ref 8–23)
CO2: 20 mmol/L — ABNORMAL LOW (ref 22–32)
CO2: 21 mmol/L — ABNORMAL LOW (ref 22–32)
Calcium: 8.1 mg/dL — ABNORMAL LOW (ref 8.9–10.3)
Calcium: 8.4 mg/dL — ABNORMAL LOW (ref 8.9–10.3)
Chloride: 111 mmol/L (ref 98–111)
Chloride: 104 mmol/L (ref 98–111)
Creatinine, Ser: 0.83 mg/dL (ref 0.61–1.24)
Creatinine, Ser: 1.12 mg/dL (ref 0.61–1.24)
GFR calc non Af Amer: >60 mL/min (ref >60)
GFR calc non Af Amer: >60 mL/min (ref >60)
Glucose, Bld: 106 mg/dL — ABNORMAL HIGH (ref 70–99)
Glucose, Bld: 206 mg/dL — ABNORMAL HIGH (ref 70–99)
Potassium: 4.7 mmol/L (ref 3.5–5.1)
Potassium: 4.8 mmol/L (ref 3.5–5.1)
Sodium: 139 mmol/L (ref 135–145)
Sodium: 133 mmol/L — ABNORMAL LOW (ref 135–145)

## 2019-12-22 LAB — PREPARE PLATELET PHERESIS: Unit division: 0

## 2019-12-22 LAB — BPAM CRYOPRECIPITATE
Blood Product Expiration Date: 202110062020
ISSUE DATE / TIME: 202110061444
Unit Type and Rh: 5100

## 2019-12-22 LAB — GLUCOSE, CAPILLARY
Glucose-Capillary: 116 mg/dL — ABNORMAL HIGH (ref 70–99)
Glucose-Capillary: 114 mg/dL — ABNORMAL HIGH (ref 70–99)
Glucose-Capillary: 109 mg/dL — ABNORMAL HIGH (ref 70–99)
Glucose-Capillary: 115 mg/dL — ABNORMAL HIGH (ref 70–99)
Glucose-Capillary: 94 mg/dL (ref 70–99)
Glucose-Capillary: 89 mg/dL (ref 70–99)
Glucose-Capillary: 113 mg/dL — ABNORMAL HIGH (ref 70–99)
Glucose-Capillary: 198 mg/dL — ABNORMAL HIGH (ref 70–99)
Glucose-Capillary: 147 mg/dL — ABNORMAL HIGH (ref 70–99)
Glucose-Capillary: 128 mg/dL — ABNORMAL HIGH (ref 70–99)

## 2019-12-22 LAB — BPAM PLATELET PHERESIS
Blood Product Expiration Date: 202110072359
ISSUE DATE / TIME: 202110061423
Unit Type and Rh: 6200

## 2019-12-22 LAB — CBC
HCT: 27.9 % — ABNORMAL LOW (ref 39.0–52.0)
HCT: 29.3 % — ABNORMAL LOW (ref 39.0–52.0)
Hemoglobin: 9.2 g/dL — ABNORMAL LOW (ref 13.0–17.0)
Hemoglobin: 9.7 g/dL — ABNORMAL LOW (ref 13.0–17.0)
MCH: 32.3 pg (ref 26.0–34.0)
MCH: 33 pg (ref 26.0–34.0)
MCHC: 33 g/dL (ref 30.0–36.0)
MCHC: 33.1 g/dL (ref 30.0–36.0)
MCV: 97.9 fL (ref 80.0–100.0)
MCV: 99.7 fL (ref 80.0–100.0)
Platelets: 108 10*3/uL — ABNORMAL LOW (ref 150–400)
Platelets: 102 10*3/uL — ABNORMAL LOW (ref 150–400)
RBC: 2.85 MIL/uL — ABNORMAL LOW (ref 4.22–5.81)
RBC: 2.94 MIL/uL — ABNORMAL LOW (ref 4.22–5.81)
RDW: 14.2 % (ref 11.5–15.5)
RDW: 14.6 % (ref 11.5–15.5)
WBC: 7.4 10*3/uL (ref 4.0–10.5)
WBC: 8.8 10*3/uL (ref 4.0–10.5)
nRBC: 0 % (ref 0.0–0.2)
nRBC: 0 % (ref 0.0–0.2)

## 2019-12-22 LAB — PREPARE CRYOPRECIPITATE: Unit division: 0

## 2019-12-22 LAB — MAGNESIUM
Magnesium: 2.7 mg/dL — ABNORMAL HIGH (ref 1.7–2.4)
Magnesium: 2.1 mg/dL (ref 1.7–2.4)

## 2019-12-22 MED ORDER — ROPINIROLE HCL 1 MG PO TABS
2.0000 mg | ORAL_TABLET | Freq: Every day | ORAL | Status: DC
  Administered 2019-12-22 – 2019-12-25 (×4): 2 mg via ORAL
  Filled 2019-12-22 (×6): qty 2

## 2019-12-22 MED ORDER — ENOXAPARIN SODIUM 40 MG/0.4ML ~~LOC~~ SOLN
40.0000 mg | Freq: Every day | SUBCUTANEOUS | Status: DC
  Administered 2019-12-22: 40 mg via SUBCUTANEOUS
  Filled 2019-12-22: qty 0.4

## 2019-12-22 MED ORDER — FUROSEMIDE 10 MG/ML IJ SOLN
20.0000 mg | Freq: Once | INTRAMUSCULAR | Status: AC
  Administered 2019-12-22: 20 mg via INTRAVENOUS
  Filled 2019-12-22: qty 2

## 2019-12-22 MED ORDER — INSULIN ASPART 100 UNIT/ML ~~LOC~~ SOLN
0.0000 [IU] | SUBCUTANEOUS | Status: DC
  Administered 2019-12-22 (×2): 2 [IU] via SUBCUTANEOUS
  Administered 2019-12-22: 4 [IU] via SUBCUTANEOUS
  Administered 2019-12-23 (×2): 2 [IU] via SUBCUTANEOUS

## 2019-12-22 MED ORDER — INSULIN DETEMIR 100 UNIT/ML ~~LOC~~ SOLN
10.0000 [IU] | Freq: Every day | SUBCUTANEOUS | Status: DC
  Administered 2019-12-22 – 2019-12-26 (×5): 10 [IU] via SUBCUTANEOUS
  Filled 2019-12-22 (×5): qty 0.1

## 2019-12-22 MED FILL — Potassium Chloride Inj 2 mEq/ML: INTRAVENOUS | Qty: 40 | Status: AC

## 2019-12-22 MED FILL — Magnesium Sulfate Inj 50%: INTRAMUSCULAR | Qty: 10 | Status: AC

## 2019-12-22 MED FILL — Heparin Sodium (Porcine) Inj 1000 Unit/ML: INTRAMUSCULAR | Qty: 30 | Status: AC

## 2019-12-22 NOTE — Discharge Summary (Signed)
Physician Discharge Summary  Patient ID: FREDERICH MONTILLA MRN: [id number] DOB/AGE: November 11, 1950 69 y.o.  Admit date: 12/16/2019 Discharge date: 12/26/2019    Referring Physician: Dr. 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 S/P CABG x 5 12/21/2019  . Essential hypertension 12/17/2019  . Hypothyroidism 12/17/2019  . NSTEMI (non-ST elevated myocardial infarction) ([place]) 12/17/2019  . Chest pain 12/16/2019  . Encounter for screening for lung cancer 08/01/2019  . Radiculopathy 01/04/2015  . Obstructive sleep apnea of adult 12/16/2012  . DM (diabetes mellitus) ([place]) 12/16/2012  . Restless legs syndrome (RLS) 12/16/2012  . Depression 12/16/2012  . BPH (benign prostatic hyperplasia) 12/16/2012  . Right inguinal hernia 07/30/2012    HPI:AT TIME OF CONSULTATION Collier Bohnet Klett is an 69 y.o. male.  HPI: Mr. Kincaid is a 69 year old gentleman with no prior cardiac history who presented with chief complaint of chest pain.  Jamahl Lemmons is a 69 year old man with a history of tobacco abuse, type 2 diabetes without complication, hypertension, hyperlipidemia, hypothyroidism, heart murmur, sleep apnea, restless leg syndrome, arthritis, reflux, and depression.  On Saturday he was mowing his yard and he had developed substernal chest pain.  This was a pressure sensation that lasted about 5 minutes and resolved with rest.  He then finished mowing the yard without any issues but later in the evening he developed retrosternal chest pressure and called EMS.  On  arrival EMS gave him nitroglycerin and his pain resolved.  He was brought to the emergency room.  His EKG showed some nonspecific ST changes.  He ruled in for non-ST elevation MI with a peak high-sensitivity troponin of 136.  He has not had any further chest pain since admission.  Today he underwent cardiac catheterization where he was found to have three-vessel disease with a totally occluded RCA, 95% circumflex stenosis, and 80% LAD stenosis.  He also had an echocardiogram which showed moderate aortic stenosis with a mean gradient of 22 and a peak gradient of 37 mmHg.  Aortic valve area was 1.11 cm.  The patient and his studies were evaluated by Dr. Roxan Hockey who recommended proceeding with surgical intervention, specifically CABG/AVR. 3 Discharged Condition: stable   Hospital Course: The patient was admitted and underwent thorough cardiology evaluation including echocardiogram and cardiac catheterization as described below.  He was medically stabilized and on 12/21/2019 he was taken to the operating room where he underwent the below described procedure.  He tolerated well and was taken to the surgical intensive care unit in stable condition.  Postoperative hospital course:  The patient was extubated without difficulty using standard post cardiac surgical protocols.  He has remained hemodynamically stable and Swan-Ganz and arterial lines were removed on postoperative day #1.  He has been started on aspirin with plans to start Plavix prior to discharge.  He has required routine pulmonary toilet for atelectasis.  Renal function has remained within normal limits but he has had some postoperative volume overload requiring diuresis.  He does have an expected acute blood loss anemia which is stable.  Most recent  hemoglobin hematocrit dated 10/11 are 8.1 and 23%.  He has been started on low-dose beta-blocker statin.  Cardura has been resumed with plans to restart lisinopril prior to discharge if  creatinine remains stable.  Foley catheter was removed on postoperative day #2.  On postoperative day 2 he was noted to have a slightly worsening trend in his thrombocytopenia and Lovenox was discontinued.  He was continued on SCDs.  Blood sugars have been under adequate control using standard measures with gradual resumption of his Onglyza and Invokana as well as his Metformin.  Oxygen has been weaned and he maintains good saturations on room air.  Incisions are noted to be healing well without evidence of infection.  He is tolerating routine cardiac rehab modalities using standard protocols. He developed atrial fibrillation on post-op day 2 and was loaded with IV amiodarone. He converted back to SR but continued to have PAF so he was started on Eliquis. The amiodarone was converted to the oral form on POD4. Pacer wires were removed without complication on POD-4. He was ready for discharge on POD-5.   Consults: cardiology  Significant Diagnostic Studies: angiography: Cardiac catheterization, echocardiogram, routine postop serial labs and chest x-rays.  Treatments: surgery:  OPERATIVE REPORT  DATE OF PROCEDURE:  12/21/2019  PREOPERATIVE DIAGNOSIS:  Severe 3-vessel coronary artery disease, status post non-ST elevation myocardial infarction and moderately severe aortic stenosis.  POSTOPERATIVE DIAGNOSIS:  Severe 3-vessel coronary artery disease, status post non-ST elevation myocardial infarction and moderately severe aortic stenosis.  PROCEDURE:   1.  Median sternotomy.  2.   Extracorporeal circulation coronary artery bypass grafting x5 (left internal mammary artery to left anterior descending, saphenous vein graft to first diagonal, sequential saphenous vein graft to obtuse marginals 2 and, saphenous vein graft to  posterior descending). 2.   Aortic valve replacement with 23 mm Inspiris Resilia bovine pericardial valve, serial #[id number]. 3.  Endoscopic vein harvest, right leg.  SURGEON:   Modesto Charon, MD  ASSISTANT:  Jadene Pierini, PA  ANESTHESIA:  General. Discharge Exam: Blood pressure 108/70, pulse 66, temperature 98 F (36.7 C), temperature source Oral, resp. rate 20, height 5\' 9"  (1.753 m), weight 71.6 kg, SpO2 98 %.  General appearance: alert, cooperative and no distress Neurologic: intact Heart: regular rate and rhythm Lungs: diminished breath sounds bibasilar Extremities: edema trace Wound: clean and dry  Disposition:    ECHOCARDIOGRAM REPORT       Patient Name:  MALACHAI SCHALK Date of Exam: 12/17/2019  Medical Rec #: [id number]  Height:    69.0 in  Accession #:  [id number]  Weight:    150.0 lb  Date of Birth: 01/12/1951  BSA:     1.828 m  Patient Age:  64 years   BP:      123/75 mmHg  Patient Gender: M      HR:      60 bpm.  Exam Location: Inpatient   Procedure: 2D Echo, Cardiac Doppler and Color Doppler   Indications:  Chest Pain 786.50 / R07.9    History:    Patient has prior history of Echocardiogram examinations,  most         recent 08/23/2008. Risk Factors:Diabetes, Hypertension and         Dyslipidemia.    Sonographer:  Alvino Chapel RCS  Referring Phys: [place]    1. Left ventricular ejection fraction, by estimation, is 60 to 65%. The  left ventricle has normal function. The left ventricle has  no regional  wall motion abnormalities. There is mild left ventricular hypertrophy.  Left ventricular diastolic parameters  were normal.  2. Right ventricular systolic function is normal. The right ventricular  size is normal.  3. The mitral valve is normal in structure. Trivial mitral valve  regurgitation. No evidence of mitral stenosis.  4. The aortic valve has an indeterminant number of cusps. There is  moderate calcification of the aortic valve. There is moderate thickening  of the aortic valve. Aortic valve regurgitation is not  visualized.  Moderate aortic valve stenosis.  5. The inferior vena cava is normal in size with greater than 50%  respiratory variability, suggesting right atrial pressure of 3 mmHg.   FINDINGS  Left Ventricle: Left ventricular ejection fraction, by estimation, is 60  to 65%. The left ventricle has normal function. The left ventricle has no  regional wall motion abnormalities. The left ventricular internal cavity  size was normal in size. There is  mild left ventricular hypertrophy. Left ventricular diastolic parameters  were normal.   Right Ventricle: The right ventricular size is normal. No increase in  right ventricular wall thickness. Right ventricular systolic function is  normal.   Left Atrium: Left atrial size was normal in size.   Right Atrium: Right atrial size was normal in size.   Pericardium: There is no evidence of pericardial effusion.   Mitral Valve: The mitral valve is normal in structure. There is mild  thickening of the mitral valve leaflet(s). There is mild calcification of  the mitral valve leaflet(s). Mild mitral annular calcification. Trivial  mitral valve regurgitation. No evidence  of mitral valve stenosis.   Tricuspid Valve: The tricuspid valve is normal in structure. Tricuspid  valve regurgitation is trivial. No evidence of tricuspid stenosis.   Aortic Valve: The aortic valve has an indeterminant number of cusps. There  is moderate calcification of the aortic valve. There is moderate  thickening of the aortic valve. There is moderate aortic valve annular  calcification. Aortic valve regurgitation  is not visualized. Moderate aortic stenosis is present. Aortic valve mean  gradient measures 22.1 mmHg. Aortic valve peak gradient measures 37.3  mmHg. Aortic valve area, by VTI measures 1.11 cm.   Pulmonic Valve: The pulmonic valve was not well visualized. Pulmonic valve  regurgitation is not visualized. No evidence of pulmonic stenosis.   Aorta: The  aortic root is normal in size and structure.   Pulmonary Artery: Indeterminant PASP, inadequate TR jet.   Venous: The inferior vena cava is normal in size with greater than 50%  respiratory variability, suggesting right atrial pressure of 3 mmHg.   IAS/Shunts: No atrial level shunt detected by color flow Doppler.     LEFT VENTRICLE  PLAX 2D  LVIDd:     4.40 cm Diastology  LVIDs:     2.10 cm LV e' medial:  8.27 cm/s  LV PW:     1.10 cm LV E/e' medial: 12.9  LV IVS:    1.10 cm LV e' lateral:  9.03 cm/s  LVOT diam:   1.80 cm LV E/e' lateral: 11.8  LV SV:     66  LV SV Index:  36  LVOT Area:   2.54 cm     RIGHT VENTRICLE  RV S prime:   15.60 cm/s  TAPSE (M-mode): 2.8 cm   LEFT ATRIUM       Index    RIGHT ATRIUM      Index  LA diam:    3.00 cm  1.64 cm/m RA Area:   18.70 cm  LA Vol (A2C):  53.1 ml 29.04 ml/m RA Volume:  54.50 ml 29.81 ml/m  LA Vol (A4C):  53.0 ml 28.99 ml/m  LA Biplane Vol: 54.4 ml 29.76 ml/m  AORTIC VALVE  AV Area (Vmax):  0.98 cm  AV Area (Vmean):  0.94 cm  AV Area (VTI):   1.11 cm  AV Vmax:      305.23 cm/s  AV Vmean:     222.681 cm/s  AV VTI:      0.595 m  AV Peak Grad:   37.3 mmHg  AV Mean Grad:   22.1 mmHg  LVOT Vmax:     118.00 cm/s  LVOT Vmean:    82.600 cm/s  LVOT VTI:     0.260 m  LVOT/AV VTI ratio: 0.44    AORTA  Ao Root diam: 3.20 cm   MITRAL VALVE  MV Area (PHT): 2.99 cm   SHUNTS  MV Decel Time: 254 msec   Systemic VTI: 0.26 m  MV E velocity: 107.00 cm/s Systemic Diam: 1.80 cm  MV A velocity: 96.80 cm/s  MV E/A ratio: 1.11   Carlyle Dolly MD  Electronically signed by Carlyle Dolly MD  Signature Date/Time: 12/17/2019/4:04:09 PM    LEFT HEART CATH AND CORONARY ANGIOGRAPHY  Conclusion    Ost RCA to Prox RCA lesion is 100% stenosed.  Prox Cx lesion is 95% stenosed.  Mid Cx lesion is 95% stenosed.  Mid  LAD lesion is 80% stenosed.   AIME MELOCHE is a 69 y.o. male    [id number] LOCATION:  FACILITY: [facility]  PHYSICIAN: Quay Burow, M.D. [birth year]   DATE OF PROCEDURE:  12/19/2019  DATE OF DISCHARGE:     CARDIAC CATHETERIZATION     History obtained from chart review.69 year old male with past medical history of diabetes mellitus, hyperlipidemia, restless leg syndrome, benign prostatic hypertrophy, tobacco abuse admitted with non-ST elevation myocardial infarction.Echocardiogram shows normal LV function, mild left ventricular hypertrophy, moderate aortic stenosis with mean gradient 22 mmHg.   IMPRESSION: Mr. Hiney has diffusely calcified vessels, three-vessel disease and preserved LV function.  He did not have a pullback gradient across the aortic valve.  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  I believe the best therapy would be complete revascularization with CABG.  The sheath was removed and a TR band was placed on the right wrist to achieve patent hemostasis.  The patient left lab in stable condition.  Heparin will be restarted 4 hours after sheath removal.  T CTS will be notified.  Quay Burow. MD, [facility] 12/19/2019 8:14 AM     Recommendations  Antiplatelet/Anticoag Recommend Aspirin 81mg  daily for moderate CAD.  Surgeon Notes    12/21/2019 7:43 PM Operative Note filed by Melrose Nakayama, MD    12/21/2019 2:40 PM Operative Note - Scan signed by Default, Provider, MD  Indications  Non-STEMI (non-ST elevated myocardial infarction) ([place]) [I21.4 (ICD-10-CM)]  Procedural Details  Technical Details PROCEDURE DESCRIPTION:   The patient was brought to the second floor [facility]iac cath lab in the postabsorptive state. He was not premedicated . His right wrist was prepped and shaved in usual sterile fashion.  Xylocaine 1% was used  for local anesthesia. A 6 French sheath was inserted into the right radial artery using standard Seldinger technique. The patient received 3500 units  of heparin intravenously.  A 5 [place] TIG catheter and right Judkins catheters were used for selective coronary angiography and obtain left heart pressures.  Isovue dye was used for the entirety of the case.  Retrograde aorta, ventricular and pullback pressures were recorded.  Radial cocktail was administered via the SideArm sheath. Estimated blood loss <50 mL.   During this procedure no sedation was administered.  Medications (Filter: Administrations occurring from 0730 to 0812 on 12/19/19) (important) Continuous medications are totaled by the amount administered until 12/19/19 0812.  Heparin (Porcine) in NaCl 1000-0.9 UT/500ML-% SOLN (mL) Total volume:  1,000 mL  Date/Time  Rate/Dose/Volume Action  12/19/19 0747  500 mL Given  0747  500 mL Given    lidocaine (PF) (XYLOCAINE) 1 % injection (mL) Total volume:  2 mL  Date/Time  Rate/Dose/Volume Action  12/19/19 0751  2 mL Given    Radial Cocktail (Verapamil 2.5 mg, NTG, Lidocaine) (mL) Total volume:  5 mL  Date/Time  Rate/Dose/Volume Action  12/19/19 0751  5 mL Given    heparin sodium (porcine) injection (Units) Total dose:  3,500 Units  Date/Time  Rate/Dose/Volume Action  12/19/19 0753  3,500 Units Given    iohexol (OMNIPAQUE) 350 MG/ML injection (mL) Total volume:  55 mL  Date/Time  Rate/Dose/Volume Action  12/19/19 0802  55 mL Given    aspirin chewable tablet 81 mg (mg) Total dose:  Cannot be calculated*  *Administration dose not documented Date/Time  Rate/Dose/Volume Action  12/19/19 0730  *Not included in total MAR Hold    atorvastatin (LIPITOR) tablet 80 mg (mg) Total dose:  Cannot be calculated*  *Administration dose not documented Date/Time  Rate/Dose/Volume Action  12/19/19 0730  *Not included in total MAR Hold    doxazosin (CARDURA)  tablet 2 mg (mg) Total dose:  Cannot be calculated*  *Administration dose not documented Date/Time  Rate/Dose/Volume Action  12/19/19 0730  *Not included in total MAR Hold    insulin aspart (novoLOG) injection 0-9 Units (Units) Total dose:  Cannot be calculated* Dosing weight:  68  *Administration dose not documented Date/Time  Rate/Dose/Volume Action  12/19/19 0730  *Not included in total MAR Hold  0800  *Not included in total Automatically Held    levothyroxine (SYNTHROID) tablet 75 mcg (mcg) Total dose:  Cannot be calculated*  *Administration dose not documented Date/Time  Rate/Dose/Volume Action  12/19/19 0730  *Not included in total MAR Hold    lisinopril (ZESTRIL) tablet 10 mg (mg) Total dose:  Cannot be calculated*  *Administration dose not documented Date/Time  Rate/Dose/Volume Action  12/19/19 0730  *Not included in total MAR Hold    metoprolol tartrate (LOPRESSOR) tablet 12.5 mg (mg) Total dose:  Cannot be calculated* Dosing weight:  68  *Administration dose not documented Date/Time  Rate/Dose/Volume Action  12/19/19 0730  *Not included in total MAR Hold    nitroGLYCERIN (NITROSTAT) SL tablet 0.4 mg (mg) Total dose:  Cannot be calculated* Dosing weight:  68  *Administration dose not documented Date/Time  Rate/Dose/Volume Action  12/19/19 0730  *Not included in total MAR Hold    pramipexole (MIRAPEX) tablet 0.125 mg (mg) Total dose:  Cannot be calculated* Dosing weight:  68  *Administration dose not documented Date/Time  Rate/Dose/Volume Action  12/19/19 0730  *Not included in total MAR Hold    sodium chloride flush (NS) 0.9 % injection 3 mL (mL) Total dose:  Cannot be calculated* Dosing weight:  66.5  *Administration dose not documented Date/Time  Rate/Dose/Volume Action  12/19/19 0730  *Not included in total [facility]  Hold    Contrast  Medication Name Total Dose  iohexol (OMNIPAQUE) 350 MG/ML injection 55 mL    Radiation/Fluoro  Fluoro time: 2.4  (min) DAP: 8 (Gycm2) Cumulative Air Kerma: 157.6 (mGy)  Coronary Findings  Diagnostic Dominance: Right Left Anterior Descending  Mid LAD lesion is 80% stenosed.  Left Circumflex  Prox Cx lesion is 95% stenosed.  Mid Cx lesion is 95% stenosed.  Right Coronary Artery  Ost RCA to Prox RCA lesion is 100% stenosed.  Right Posterior Atrioventricular Artery  Collaterals  RPAV filled by collaterals from Prox Cx.    First Right Posterolateral Branch  Collaterals  1st RPL filled by collaterals from Dist LAD.    Intervention  No interventions have been documented. Coronary Diagrams  Diagnostic Dominance: Right  Intervention   Discharge Instructions    Amb Referral to Cardiac Rehabilitation   Complete by: As directed    Diagnosis:  CABG Valve Replacement     Valve: Aortic   CABG X ___: 5   After initial evaluation and assessments completed: Virtual Based Care may be provided alone or in conjunction with Phase 2 Cardiac Rehab based on patient barriers.: Yes     Allergies as of 12/26/2019   No Known Allergies     Medication List    STOP taking these medications   aspirin 81 MG tablet Replaced by: aspirin 81 MG EC tablet   lisinopril 10 MG tablet Commonly known as: ZESTRIL     TAKE these medications   amiodarone 200 MG tablet Commonly known as: Pacerone Take 2 tablets (400 mg total) by mouth 2 (two) times daily. For 5 days then decrease to 1 tablet twice daily for 10 days then decrease to 1 tablet daily.   apixaban 5 MG Tabs tablet Commonly known as: ELIQUIS Take 1 tablet (5 mg total) by mouth 2 (two) times daily.   aspirin 81 MG EC tablet Take 1 tablet (81 mg total) by mouth daily. Swallow whole. Start taking on: December 27, 2019 Replaces: aspirin 81 MG tablet   atorvastatin 20 MG tablet Commonly known as: LIPITOR Take 20 mg by mouth daily.   cholestyramine light 4 GM/DOSE powder Commonly known as: PREVALITE Take 1 g by mouth daily as needed (For  stomach).   doxazosin 2 MG tablet Commonly known as: CARDURA Take 2 mg by mouth every morning. Takes for urinary freq   furosemide 40 MG tablet Commonly known as: LASIX Take 1 tablet (40 mg total) by mouth daily. Start taking on: December 27, 2019   Invokana 100 MG Tabs tablet Generic drug: canagliflozin Take 100 mg by mouth daily.   Levemir FlexTouch 100 UNIT/ML FlexPen Generic drug: insulin detemir Inject 20 Units into the skin at bedtime.   levothyroxine 50 MCG tablet Commonly known as: SYNTHROID Take 50 mcg by mouth daily before breakfast.   metFORMIN 1000 MG tablet Commonly known as: GLUCOPHAGE Take 1,000 mg by mouth 2 (two) times daily with a meal.   metoprolol tartrate 25 MG tablet Commonly known as: LOPRESSOR Take 1 tablet (25 mg total) by mouth 2 (two) times daily.   Onglyza 5 MG Tabs tablet Generic drug: saxagliptin HCl Take 5 mg by mouth.   potassium chloride SA 20 MEQ tablet Commonly known as: KLOR-CON Take 1 tablet (20 mEq total) by mouth daily for 5 days. Start taking on: December 27, 2019   rOPINIRole 1 MG tablet Commonly known as: REQUIP Take 2 mg by mouth at bedtime.   traMADol 50 MG  tablet Commonly known as: ULTRAM Take 1 tablet (50 mg total) by mouth every 6 (six) hours as needed for up to 7 days for moderate pain.       Follow-up Information    Melrose Nakayama, MD. Go on 01/17/2020.   Specialty: Cardiothoracic Surgery Why: Your appointment is at 9:30am.  Also on the day you see Dr. Roxan Hockey please obtain a chest x-ray at [facility] 1/2-hour prior to appointment.  It is located in the same office complex on the first floor. Contact information: [place]  [address] [phone]        Charlie Pitter, PA-C. Go on 01/10/2020.   Specialties: Cardiology, Radiology Why: Your appointment is at 10:45. Contact information: [address] [employer] [place] [address] [phone]               The patient has been discharged on:   1.Beta Blocker:  Yes Blue.Reese   ]                              No   [   ]                              If No, reason:  2.Ace Inhibitor/ARB: Yes [  ]                                     No  [  x  ]                                     If No, reason: BP soft  3.Statin:   Yes [ y  ]                  No  [   ]                  If No, reason:  4.Ecasa:  Yes  [ y  ]                  No   [   ]                  If No, reason:  Signed: Dave Mannes GMichael Litter PA-C 12/26/2019, 9:05 AM

## 2019-12-22 NOTE — Addendum Note (Signed)
Addendum  created 12/22/19 0618 by Josephine Igo, CRNA   Order list changed

## 2019-12-22 NOTE — Progress Notes (Signed)
1 Day Post-Op Procedure(s) (LRB): CORONARY ARTERY BYPASS GRAFTING TIMES FIVE USING LEFT INTERNAL [employer] ARTERY AND ENDOSCOPICALLY HARVESTED RIGHT GREATER SAPHENOUS VEIN. (N/A) AORTIC VALVE REPLACEMENT WITH INSPIRIS RESILIA AORTIC VALVE SIZE 23MM (N/A) TRANSESOPHAGEAL ECHOCARDIOGRAM (TEE) (N/A) ENDOVEIN HARVEST OF RIGHT GREATER SAPHENOUS VEIN Subjective: Some incisional pain, denies nausea  Objective: Vital signs in last 24 hours: Temp:  [95.9 F (35.5 C)-99.7 F (37.6 C)] 99 F (37.2 C) (10/07 0700) Pulse Rate:  [50-88] 71 (10/07 0700) Cardiac Rhythm: Atrial paced (10/07 0400) Resp:  [8-27] 16 (10/07 0700) BP: (113-134)/(59-69) 126/61 (10/07 0700) SpO2:  [94 %-100 %] 95 % (10/07 0700) Arterial Line BP: (91-170)/(38-134) 144/50 (10/07 0700) FiO2 (%):  [40 %-50 %] 40 % (10/06 1728) Weight:  [73.2 kg] 73.2 kg (10/07 0600)  Hemodynamic parameters for last 24 hours: PAP: (13-34)/(6-14) 28/7 CO:  [2.7 L/min-4.8 L/min] 4.3 L/min CI:  [1.5 L/min/m2-2.7 L/min/m2] 2.4 L/min/m2  Intake/Output from previous day: 10/06 0701 - 10/07 0700 In: 5798.6 [I.V.:3055.8; Blood:923; IV Piggyback:1819.8] Out: 0093 [Urine:2490; Blood:819; Chest Tube:440] Intake/Output this shift: No intake/output data recorded.  General appearance: alert, cooperative and no distress Neurologic: intact Heart: regular rate and rhythm Lungs: diminished breath sounds bibasilar Abdomen: normal findings: soft, non-tender  Lab Results: Recent Labs    12/21/19 2002 12/22/19 0416  WBC 9.2 7.4  HGB 9.6* 9.2*  HCT 29.1* 27.9*  PLT 125* 108*   BMET:  Recent Labs    12/21/19 2002 12/22/19 0416  NA 138 139  K 4.4 4.7  CL 111 111  CO2 20* 20*  GLUCOSE 104* 106*  BUN 12 14  CREATININE 0.79 0.83  CALCIUM 7.6* 8.1*    PT/INR:  Recent Labs    12/21/19 1510  LABPROT 17.8*  INR 1.5*   ABG    Component Value Date/Time   PHART 7.357 12/21/2019 1945   HCO3 20.8 12/21/2019 1945   TCO2 22 12/21/2019 1945    ACIDBASEDEF 4.0 (H) 12/21/2019 1945   O2SAT 97.0 12/21/2019 1945   CBG (last 3)  Recent Labs    12/22/19 0134 12/22/19 0415 12/22/19 0633  GLUCAP 116* 114* 109*    Assessment/Plan: S/P Procedure(s) (LRB): CORONARY ARTERY BYPASS GRAFTING TIMES FIVE USING LEFT INTERNAL [employer] ARTERY AND ENDOSCOPICALLY HARVESTED RIGHT GREATER SAPHENOUS VEIN. (N/A) AORTIC VALVE REPLACEMENT WITH INSPIRIS RESILIA AORTIC VALVE SIZE 23MM (N/A) TRANSESOPHAGEAL ECHOCARDIOGRAM (TEE) (N/A) ENDOVEIN HARVEST OF RIGHT GREATER SAPHENOUS VEIN POD # 1  CV- in SR, good hemodynamics  Dc Swan and A line  ASA, will add plavix prior to DC  No coumadin unless has A fib RESP- IS for basilar atelectasis RENAL- creatinine and lytes Ok  Weight up- diurese ENDO- CBG well controlled  Transition to levemir + SSI   Resume oral meds tomorrow if Po intake Ok Anemia secondary to ABL- mild, follow Thrombocytopenia- monitor SCD + enoxaparin for DVT prophylaxis Cardiac rehab Dc chest tubes   LOS: 5 days    Melrose Nakayama 12/22/2019

## 2019-12-22 NOTE — Progress Notes (Signed)
CT surgery p.m. Rounds  Patient resting comfortably breathing room air Sinus rhythm P.m. labs satisfactory Continue current care

## 2019-12-23 ENCOUNTER — Inpatient Hospital Stay (HOSPITAL_COMMUNITY): Payer: Medicare Other

## 2019-12-23 LAB — BASIC METABOLIC PANEL
Anion gap: 7 (ref 5–15)
BUN: 19 mg/dL (ref 8–23)
CO2: 23 mmol/L (ref 22–32)
Calcium: 8.4 mg/dL — ABNORMAL LOW (ref 8.9–10.3)
Chloride: 104 mmol/L (ref 98–111)
Creatinine, Ser: 0.88 mg/dL (ref 0.61–1.24)
GFR calc non Af Amer: >60 mL/min (ref >60)
Glucose, Bld: 127 mg/dL — ABNORMAL HIGH (ref 70–99)
Potassium: 4.6 mmol/L (ref 3.5–5.1)
Sodium: 134 mmol/L — ABNORMAL LOW (ref 135–145)

## 2019-12-23 LAB — GLUCOSE, CAPILLARY
Glucose-Capillary: 120 mg/dL — ABNORMAL HIGH (ref 70–99)
Glucose-Capillary: 121 mg/dL — ABNORMAL HIGH (ref 70–99)
Glucose-Capillary: 130 mg/dL — ABNORMAL HIGH (ref 70–99)
Glucose-Capillary: 144 mg/dL — ABNORMAL HIGH (ref 70–99)
Glucose-Capillary: 152 mg/dL — ABNORMAL HIGH (ref 70–99)
Glucose-Capillary: 167 mg/dL — ABNORMAL HIGH (ref 70–99)

## 2019-12-23 LAB — CBC
HCT: 28.3 % — ABNORMAL LOW (ref 39.0–52.0)
Hemoglobin: 9.1 g/dL — ABNORMAL LOW (ref 13.0–17.0)
MCH: 31.8 pg (ref 26.0–34.0)
MCHC: 32.2 g/dL (ref 30.0–36.0)
MCV: 99 fL (ref 80.0–100.0)
Platelets: 96 10*3/uL — ABNORMAL LOW (ref 150–400)
RBC: 2.86 MIL/uL — ABNORMAL LOW (ref 4.22–5.81)
RDW: 14.6 % (ref 11.5–15.5)
WBC: 8.8 10*3/uL (ref 4.0–10.5)
nRBC: 0 % (ref 0.0–0.2)

## 2019-12-23 LAB — SURGICAL PATHOLOGY

## 2019-12-23 MED ORDER — CANAGLIFLOZIN 100 MG PO TABS
100.0000 mg | ORAL_TABLET | Freq: Every day | ORAL | Status: DC
  Administered 2019-12-23 – 2019-12-26 (×4): 100 mg via ORAL
  Filled 2019-12-23 (×4): qty 1

## 2019-12-23 MED ORDER — AMIODARONE HCL IN DEXTROSE 360-4.14 MG/200ML-% IV SOLN
30.0000 mg/h | INTRAVENOUS | Status: DC
  Administered 2019-12-24 – 2019-12-25 (×3): 30 mg/h via INTRAVENOUS
  Filled 2019-12-23 (×3): qty 200

## 2019-12-23 MED ORDER — INSULIN ASPART 100 UNIT/ML ~~LOC~~ SOLN
0.0000 [IU] | Freq: Every day | SUBCUTANEOUS | Status: DC
  Administered 2019-12-25: 2 [IU] via SUBCUTANEOUS

## 2019-12-23 MED ORDER — LINAGLIPTIN 5 MG PO TABS
5.0000 mg | ORAL_TABLET | Freq: Every day | ORAL | Status: DC
  Administered 2019-12-23 – 2019-12-25 (×3): 5 mg via ORAL
  Filled 2019-12-23 (×3): qty 1

## 2019-12-23 MED ORDER — AMIODARONE LOAD VIA INFUSION: 150.0000 mg | Freq: Once | INTRAVENOUS | Status: DC

## 2019-12-23 MED ORDER — DOXAZOSIN MESYLATE 2 MG PO TABS
2.0000 mg | ORAL_TABLET | Freq: Every morning | ORAL | Status: DC
  Administered 2019-12-23 – 2019-12-26 (×4): 2 mg via ORAL
  Filled 2019-12-23 (×4): qty 1

## 2019-12-23 MED ORDER — POTASSIUM CHLORIDE CRYS ER 20 MEQ PO TBCR
20.0000 meq | EXTENDED_RELEASE_TABLET | Freq: Every day | ORAL | Status: DC
  Administered 2019-12-23 – 2019-12-25 (×3): 20 meq via ORAL
  Filled 2019-12-23 (×3): qty 1

## 2019-12-23 MED ORDER — AMIODARONE LOAD VIA INFUSION
150.0000 mg | Freq: Once | INTRAVENOUS | Status: AC
  Administered 2019-12-23: 150 mg via INTRAVENOUS
  Filled 2019-12-23: qty 83.34

## 2019-12-23 MED ORDER — INSULIN ASPART 100 UNIT/ML ~~LOC~~ SOLN
0.0000 [IU] | Freq: Three times a day (TID) | SUBCUTANEOUS | Status: DC
  Administered 2019-12-23: 3 [IU] via SUBCUTANEOUS
  Administered 2019-12-23 (×2): 2 [IU] via SUBCUTANEOUS
  Administered 2019-12-24: 3 [IU] via SUBCUTANEOUS
  Administered 2019-12-24: 2 [IU] via SUBCUTANEOUS
  Administered 2019-12-24: 5 [IU] via SUBCUTANEOUS
  Administered 2019-12-25 (×2): 3 [IU] via SUBCUTANEOUS
  Administered 2019-12-25 – 2019-12-26 (×2): 2 [IU] via SUBCUTANEOUS

## 2019-12-23 MED ORDER — AMIODARONE HCL IN DEXTROSE 360-4.14 MG/200ML-% IV SOLN
60.0000 mg/h | INTRAVENOUS | Status: AC
  Administered 2019-12-23 (×2): 60 mg/h via INTRAVENOUS
  Filled 2019-12-23 (×2): qty 200

## 2019-12-23 MED ORDER — FUROSEMIDE 40 MG PO TABS
40.0000 mg | ORAL_TABLET | Freq: Every day | ORAL | Status: DC
  Administered 2019-12-23 – 2019-12-26 (×4): 40 mg via ORAL
  Filled 2019-12-23 (×4): qty 1

## 2019-12-23 MED FILL — Electrolyte-R (PH 7.4) Solution: INTRAVENOUS | Qty: 5000 | Status: AC

## 2019-12-23 MED FILL — Heparin Sodium (Porcine) Inj 1000 Unit/ML: INTRAMUSCULAR | Qty: 20 | Status: AC

## 2019-12-23 MED FILL — Sodium Bicarbonate IV Soln 8.4%: INTRAVENOUS | Qty: 50 | Status: AC

## 2019-12-23 MED FILL — Sodium Chloride IV Soln 0.9%: INTRAVENOUS | Qty: 2000 | Status: AC

## 2019-12-23 MED FILL — Albumin, Human Inj 5%: INTRAVENOUS | Qty: 250 | Status: AC

## 2019-12-23 MED FILL — Mannitol IV Soln 20%: INTRAVENOUS | Qty: 500 | Status: AC

## 2019-12-23 MED FILL — Lidocaine HCl Local Soln Prefilled Syringe 100 MG/5ML (2%): INTRAMUSCULAR | Qty: 5 | Status: AC

## 2019-12-23 NOTE — Progress Notes (Signed)
2 Days Post-Op Procedure(s) (LRB): CORONARY ARTERY BYPASS GRAFTING TIMES FIVE USING LEFT INTERNAL [employer] ARTERY AND ENDOSCOPICALLY HARVESTED RIGHT GREATER SAPHENOUS VEIN. (N/A) AORTIC VALVE REPLACEMENT WITH INSPIRIS RESILIA AORTIC VALVE SIZE 23MM (N/A) TRANSESOPHAGEAL ECHOCARDIOGRAM (TEE) (N/A) ENDOVEIN HARVEST OF RIGHT GREATER SAPHENOUS VEIN Subjective: No complaints  Objective: Vital signs in last 24 hours: Temp:  [98.1 F (36.7 C)-99.3 F (37.4 C)] 98.7 F (37.1 C) (10/08 1528) Pulse Rate:  [65-132] 79 (10/08 1500) Cardiac Rhythm: Atrial fibrillation (10/08 1140) Resp:  [12-24] 20 (10/08 1100) BP: (88-132)/(49-76) 88/54 (10/08 1500) SpO2:  [90 %-98 %] 94 % (10/08 1500) Weight:  [73.8 kg] 73.8 kg (10/08 0500)  Hemodynamic parameters for last 24 hours:    Intake/Output from previous day: 10/07 0701 - 10/08 0700 In: 1016.9 [P.O.:700; I.V.:116.7; IV Piggyback:200.1] Out: 1510 [Urine:1510] Intake/Output this shift: Total I/O In: 112.1 [I.V.:112.1] Out: 220 [Urine:220]  General appearance: alert and cooperative Neurologic: intact Heart: irregularly irregular rhythm Lungs: clear to auscultation bilaterally Abdomen: soft, non-tender; bowel sounds normal; no masses,  no organomegaly Extremities: warm Wound: c/d/i  Lab Results: Recent Labs    12/22/19 1622 12/23/19 0350  WBC 8.8 8.8  HGB 9.7* 9.1*  HCT 29.3* 28.3*  PLT 102* 96*   BMET:  Recent Labs    12/22/19 1622 12/23/19 0350  NA 133* 134*  K 4.8 4.6  CL 104 104  CO2 21* 23  GLUCOSE 206* 127*  BUN 18 19  CREATININE 1.12 0.88  CALCIUM 8.4* 8.4*    PT/INR:  Recent Labs    12/21/19 1510  LABPROT 17.8*  INR 1.5*   ABG    Component Value Date/Time   PHART 7.357 12/21/2019 1945   HCO3 20.8 12/21/2019 1945   TCO2 22 12/21/2019 1945   ACIDBASEDEF 4.0 (H) 12/21/2019 1945   O2SAT 97.0 12/21/2019 1945   CBG (last 3)  Recent Labs    12/23/19 0629 12/23/19 1127 12/23/19 1530  GLUCAP 130* 144*  167*    Assessment/Plan: S/P Procedure(s) (LRB): CORONARY ARTERY BYPASS GRAFTING TIMES FIVE USING LEFT INTERNAL [employer] ARTERY AND ENDOSCOPICALLY HARVESTED RIGHT GREATER SAPHENOUS VEIN. (N/A) AORTIC VALVE REPLACEMENT WITH INSPIRIS RESILIA AORTIC VALVE SIZE 23MM (N/A) TRANSESOPHAGEAL ECHOCARDIOGRAM (TEE) (N/A) ENDOVEIN HARVEST OF RIGHT GREATER SAPHENOUS VEIN amiodarone for af  Watch BP   LOS: 6 days    Wonda Olds 12/23/2019

## 2019-12-23 NOTE — Progress Notes (Signed)
  Amiodarone Drug - Drug Interaction Consult Note  Recommendations: No major DDIs of note. Continue with current regimen and continue to monitor.   Amiodarone is metabolized by the cytochrome P450 system and therefore has the potential to cause many drug interactions. Amiodarone has an average plasma half-life of 50 days (range 20 to 100 days).   There is potential for drug interactions to occur several weeks or months after stopping treatment and the onset of drug interactions may be slow after initiating amiodarone.   [x]  Statins: Increased risk of myopathy. Simvastatin- restrict dose to 20mg  daily. Other statins: counsel patients to report any muscle pain or weakness immediately.  []  Anticoagulants: Amiodarone can increase anticoagulant effect. Consider warfarin dose reduction. Patients should be monitored closely and the dose of anticoagulant altered accordingly, remembering that amiodarone levels take several weeks to stabilize.  []  Antiepileptics: Amiodarone can increase plasma concentration of phenytoin, the dose should be reduced. Note that small changes in phenytoin dose can result in large changes in levels. Monitor patient and counsel on signs of toxicity.  [x]  Beta blockers: increased risk of bradycardia, AV block and myocardial depression. Sotalol - avoid concomitant use.  []   Calcium channel blockers (diltiazem and verapamil): increased risk of bradycardia, AV block and myocardial depression.  []   Cyclosporine: Amiodarone increases levels of cyclosporine. Reduced dose of cyclosporine is recommended.  []  Digoxin dose should be halved when amiodarone is started.  [x]  Diuretics: increased risk of cardiotoxicity if hypokalemia occurs.  []  Oral hypoglycemic agents (glyburide, glipizide, glimepiride): increased risk of hypoglycemia. Patient's glucose levels should be monitored closely when initiating amiodarone therapy.   []  Drugs that prolong the QT interval:  Torsades de pointes  risk may be increased with concurrent use - avoid if possible.  Monitor QTc, also keep magnesium/potassium WNL if concurrent therapy can't be avoided. Marland Kitchen Antibiotics: e.g. fluoroquinolones, erythromycin. . Antiarrhythmics: e.g. quinidine, procainamide, disopyramide, sotalol. . Antipsychotics: e.g. phenothiazines, haloperidol.  . Lithium, tricyclic antidepressants, and methadone.  Thank You,   Claudina Lick, PharmD PGY1 Acute Care Pharmacy Resident 12/23/2019 1:09 PM  Please check [email or web address] for unit-specific pharmacy phone numbers.

## 2019-12-23 NOTE — Progress Notes (Signed)
CARDIAC REHAB PHASE I   PRE:  Rate/Rhythm: 99 afib with brief 133    BP: sitting 97/72    SaO2: 92 RA  MODE:  Ambulation: 370 ft   POST:  Rate/Rhythm: 105 afib     BP: sitting 94/47     SaO2: 100 RA  Pt with recent afib, on amio. Moved to EOB with verbal cues and min assist. Stood independently. Used RW in hall, steady pace. HR controlled. Rest x1 due to SOB. Finished walk and to recliner. Encouraged IS, VSS. Will f/u tomorrow. [place], ACSM 12/23/2019 2:35 PM

## 2019-12-23 NOTE — Progress Notes (Signed)
2 Days Post-Op Procedure(s) (LRB): CORONARY ARTERY BYPASS GRAFTING TIMES FIVE USING LEFT INTERNAL [employer] ARTERY AND ENDOSCOPICALLY HARVESTED RIGHT GREATER SAPHENOUS VEIN. (N/A) AORTIC VALVE REPLACEMENT WITH INSPIRIS RESILIA AORTIC VALVE SIZE 23MM (N/A) TRANSESOPHAGEAL ECHOCARDIOGRAM (TEE) (N/A) ENDOVEIN HARVEST OF RIGHT GREATER SAPHENOUS VEIN Subjective: A little sore after walk, appetite fair  Objective: Vital signs in last 24 hours: Temp:  [98.1 F (36.7 C)-99.3 F (37.4 C)] 98.7 F (37.1 C) (10/08 0630) Pulse Rate:  [65-97] 79 (10/08 0700) Cardiac Rhythm: Normal sinus rhythm (10/08 0400) Resp:  [12-24] 24 (10/08 0700) BP: (110-132)/(60-76) 130/68 (10/08 0700) SpO2:  [91 %-98 %] 97 % (10/08 0700) Arterial Line BP: (150)/(52) 150/52 (10/07 0800) Weight:  [73.8 kg] 73.8 kg (10/08 0500)  Hemodynamic parameters for last 24 hours:    Intake/Output from previous day: 10/07 0701 - 10/08 0700 In: 1016.9 [P.O.:700; I.V.:116.7; IV Piggyback:200.1] Out: 1510 [Urine:1510] Intake/Output this shift: No intake/output data recorded.  General appearance: alert, cooperative and no distress Neurologic: intact Heart: regular rate and rhythm and faint rub Lungs: diminished breath sounds bibasilar Abdomen: normal findings: soft, non-tender  Lab Results: Recent Labs    12/22/19 1622 12/23/19 0350  WBC 8.8 8.8  HGB 9.7* 9.1*  HCT 29.3* 28.3*  PLT 102* 96*   BMET:  Recent Labs    12/22/19 1622 12/23/19 0350  NA 133* 134*  K 4.8 4.6  CL 104 104  CO2 21* 23  GLUCOSE 206* 127*  BUN 18 19  CREATININE 1.12 0.88  CALCIUM 8.4* 8.4*    PT/INR:  Recent Labs    12/21/19 1510  LABPROT 17.8*  INR 1.5*   ABG    Component Value Date/Time   PHART 7.357 12/21/2019 1945   HCO3 20.8 12/21/2019 1945   TCO2 22 12/21/2019 1945   ACIDBASEDEF 4.0 (H) 12/21/2019 1945   O2SAT 97.0 12/21/2019 1945   CBG (last 3)  Recent Labs    12/22/19 2305 12/23/19 0349 12/23/19 0629  GLUCAP  128* 120* 130*    Assessment/Plan: S/P Procedure(s) (LRB): CORONARY ARTERY BYPASS GRAFTING TIMES FIVE USING LEFT INTERNAL [employer] ARTERY AND ENDOSCOPICALLY HARVESTED RIGHT GREATER SAPHENOUS VEIN. (N/A) AORTIC VALVE REPLACEMENT WITH INSPIRIS RESILIA AORTIC VALVE SIZE 23MM (N/A) TRANSESOPHAGEAL ECHOCARDIOGRAM (TEE) (N/A) ENDOVEIN HARVEST OF RIGHT GREATER SAPHENOUS VEIN -POD # 2 Doing well CV- in SR  ASA, add Plavix prior to dc  Beta blocker, statin  Resume Cardura, resume lisinopril in 24-48 hours RESP- IS for atelectasis RENAL- creatinine and lytes OK  DC Foley Anemia secondary to ABL- mild, follow Thrombocytopenia- PLT down slightly, ambulatory, will dc enoxaparin, continue SCD Cardiac rehab ENDO- CBG well controlled- change to [facility], HS  Resume Onglyza and Invokana, wait another day with metformin  On synthroid for hypothyroidism   LOS: 6 days    Steven Lawrence 12/23/2019

## 2019-12-24 LAB — GLUCOSE, CAPILLARY
Glucose-Capillary: 122 mg/dL — ABNORMAL HIGH (ref 70–99)
Glucose-Capillary: 152 mg/dL — ABNORMAL HIGH (ref 70–99)
Glucose-Capillary: 207 mg/dL — ABNORMAL HIGH (ref 70–99)
Glucose-Capillary: 107 mg/dL — ABNORMAL HIGH (ref 70–99)

## 2019-12-24 NOTE — Progress Notes (Signed)
3 Days Post-Op Procedure(s) (LRB): CORONARY ARTERY BYPASS GRAFTING TIMES FIVE USING LEFT INTERNAL [employer] ARTERY AND ENDOSCOPICALLY HARVESTED RIGHT GREATER SAPHENOUS VEIN. (N/A) AORTIC VALVE REPLACEMENT WITH INSPIRIS RESILIA AORTIC VALVE SIZE 23MM (N/A) TRANSESOPHAGEAL ECHOCARDIOGRAM (TEE) (N/A) ENDOVEIN HARVEST OF RIGHT GREATER SAPHENOUS VEIN Subjective: No complaints  Objective: Vital signs in last 24 hours: Temp:  [97.9 F (36.6 C)-98.9 F (37.2 C)] 98.8 F (37.1 C) (10/09 0086) Pulse Rate:  [62-132] 72 (10/09 0600) Cardiac Rhythm: Normal sinus rhythm (10/09 0400) Resp:  [17-20] 20 (10/08 1100) BP: (88-117)/(49-70) 117/57 (10/09 0600) SpO2:  [90 %-100 %] 97 % (10/09 0600) Weight:  [72.3 kg] 72.3 kg (10/09 0600)  Hemodynamic parameters for last 24 hours:    Intake/Output from previous day: 10/08 0701 - 10/09 0700 In: 468.9 [I.V.:468.9] Out: 220 [Urine:220] Intake/Output this shift: No intake/output data recorded.  General appearance: alert and cooperative Neurologic: intact Heart: regular rate and rhythm, S1, S2 normal, no murmur, click, rub or gallop Lungs: clear to auscultation bilaterally Abdomen: soft, non-tender; bowel sounds normal; no masses,  no organomegaly Extremities: extremities normal, atraumatic, no cyanosis or edema Wound: intact  Lab Results: Recent Labs    12/22/19 1622 12/23/19 0350  WBC 8.8 8.8  HGB 9.7* 9.1*  HCT 29.3* 28.3*  PLT 102* 96*   BMET:  Recent Labs    12/22/19 1622 12/23/19 0350  NA 133* 134*  K 4.8 4.6  CL 104 104  CO2 21* 23  GLUCOSE 206* 127*  BUN 18 19  CREATININE 1.12 0.88  CALCIUM 8.4* 8.4*    PT/INR:  Recent Labs    12/21/19 1510  LABPROT 17.8*  INR 1.5*   ABG    Component Value Date/Time   PHART 7.357 12/21/2019 1945   HCO3 20.8 12/21/2019 1945   TCO2 22 12/21/2019 1945   ACIDBASEDEF 4.0 (H) 12/21/2019 1945   O2SAT 97.0 12/21/2019 1945   CBG (last 3)  Recent Labs    12/23/19 1943 12/23/19 2342  12/24/19 0354  GLUCAP 121* 152* 122*    Assessment/Plan: S/P Procedure(s) (LRB): CORONARY ARTERY BYPASS GRAFTING TIMES FIVE USING LEFT INTERNAL [employer] ARTERY AND ENDOSCOPICALLY HARVESTED RIGHT GREATER SAPHENOUS VEIN. (N/A) AORTIC VALVE REPLACEMENT WITH INSPIRIS RESILIA AORTIC VALVE SIZE 23MM (N/A) TRANSESOPHAGEAL ECHOCARDIOGRAM (TEE) (N/A) ENDOVEIN HARVEST OF RIGHT GREATER SAPHENOUS VEIN txfer to [facility]  Hold off on eliquis for now as he is back in NSR   LOS: 7 days    Wonda Olds 12/24/2019

## 2019-12-24 NOTE — Progress Notes (Signed)
Pt was transferred to 4E and gave report to [employer], handed pt off with RN in the room, pt was stable, alert and no pain.

## 2019-12-24 NOTE — Progress Notes (Signed)
CARDIAC REHAB PHASE I   PRE:  Rate/Rhythm: 70 SR    BP: sitting 109/60    SaO2: 97 RA  MODE:  Ambulation: 660 ft   POST:  Rate/Rhythm: 88 SR    BP: sitting 129/60     SaO2: 98 RA  Assist x1 to get out of bed. Pt able to ambulate without RW, no real assist in hall. No c/o, steady, wife present. Maintained NSR. Return to bed. VSS. Discussed IS, sternal precautions, smoking cessation, diet, exercise, and CRPII with pt and wife. Very receptive. He is motivated to quit smoking. Will refer to [employer].  [place], ACSM 12/24/2019 11:22 AM

## 2019-12-24 NOTE — Plan of Care (Signed)

## 2019-12-25 ENCOUNTER — Inpatient Hospital Stay (HOSPITAL_COMMUNITY): Payer: Medicare Other

## 2019-12-25 DIAGNOSIS — I214 Non-ST elevation (NSTEMI) myocardial infarction: Secondary | ICD-10-CM | POA: Diagnosis not present

## 2019-12-25 DIAGNOSIS — E1165 Type 2 diabetes mellitus with hyperglycemia: Secondary | ICD-10-CM

## 2019-12-25 LAB — GLUCOSE, CAPILLARY
Glucose-Capillary: 155 mg/dL — ABNORMAL HIGH (ref 70–99)
Glucose-Capillary: 148 mg/dL — ABNORMAL HIGH (ref 70–99)
Glucose-Capillary: 165 mg/dL — ABNORMAL HIGH (ref 70–99)
Glucose-Capillary: 205 mg/dL — ABNORMAL HIGH (ref 70–99)

## 2019-12-25 LAB — BASIC METABOLIC PANEL
Anion gap: 11 (ref 5–15)
BUN: 19 mg/dL (ref 8–23)
CO2: 24 mmol/L (ref 22–32)
Calcium: 8.3 mg/dL — ABNORMAL LOW (ref 8.9–10.3)
Chloride: 102 mmol/L (ref 98–111)
Creatinine, Ser: 1.1 mg/dL (ref 0.61–1.24)
GFR, Estimated: >60 mL/min (ref >60)
Glucose, Bld: 152 mg/dL — ABNORMAL HIGH (ref 70–99)
Potassium: 3.8 mmol/L (ref 3.5–5.1)
Sodium: 137 mmol/L (ref 135–145)

## 2019-12-25 LAB — CBC
HCT: 25.5 % — ABNORMAL LOW (ref 39.0–52.0)
Hemoglobin: 8.6 g/dL — ABNORMAL LOW (ref 13.0–17.0)
MCH: 32.7 pg (ref 26.0–34.0)
MCHC: 33.7 g/dL (ref 30.0–36.0)
MCV: 97 fL (ref 80.0–100.0)
Platelets: 119 10*3/uL — ABNORMAL LOW (ref 150–400)
RBC: 2.63 MIL/uL — ABNORMAL LOW (ref 4.22–5.81)
RDW: 14.6 % (ref 11.5–15.5)
WBC: 6.7 10*3/uL (ref 4.0–10.5)
nRBC: 0 % (ref 0.0–0.2)

## 2019-12-25 MED ORDER — METOPROLOL TARTRATE 25 MG PO TABS
25.0000 mg | ORAL_TABLET | Freq: Two times a day (BID) | ORAL | Status: DC
  Administered 2019-12-25: 25 mg via ORAL
  Filled 2019-12-25: qty 1

## 2019-12-25 MED ORDER — AMIODARONE HCL IN DEXTROSE 360-4.14 MG/200ML-% IV SOLN
30.0000 mg/h | INTRAVENOUS | Status: DC
  Administered 2019-12-25: 30 mg/h via INTRAVENOUS
  Filled 2019-12-25: qty 200

## 2019-12-25 MED ORDER — AMIODARONE HCL 200 MG PO TABS
400.0000 mg | ORAL_TABLET | Freq: Two times a day (BID) | ORAL | Status: DC
  Administered 2019-12-25 – 2019-12-26 (×3): 400 mg via ORAL
  Filled 2019-12-25 (×2): qty 2

## 2019-12-25 MED ORDER — APIXABAN 5 MG PO TABS
5.0000 mg | ORAL_TABLET | Freq: Two times a day (BID) | ORAL | Status: DC
  Administered 2019-12-25 – 2019-12-26 (×2): 5 mg via ORAL
  Filled 2019-12-25 (×2): qty 1

## 2019-12-25 NOTE — Progress Notes (Addendum)
ANTICOAGULATION CONSULT NOTE - Initial Consult  Pharmacy Consult for apixaban Indication: atrial fibrillation  No Known Allergies  Patient Measurements: Height: 5\' 9"  (175.3 cm) Weight: 71.8 kg (158 lb 4.6 oz) IBW/kg (Calculated) : 70.7  Vital Signs: Temp: 98.2 F (36.8 C) (10/10 0315) Temp Source: Oral (10/10 0315) BP: 121/80 (10/10 0730) Pulse Rate: 91 (10/10 0730)  Labs: Recent Labs    12/22/19 1622 12/22/19 1622 12/23/19 0350 12/25/19 0109  HGB 9.7*   < > 9.1* 8.6*  HCT 29.3*  --  28.3* 25.5*  PLT 102*  --  96* 119*  CREATININE 1.12  --  0.88 1.10   < > = values in this interval not displayed.    Estimated Creatinine Clearance: 63.4 mL/min (by C-G formula based on SCr of 1.1 mg/dL).   Medical History: Past Medical History:  Diagnosis Date  . Acid reflux   . Arthritis   . BPH (benign prostatic hyperplasia)    mild  . Depression   . Diabetes mellitus without complication ([place])    type II  . Heart murmur    with AVSC echo 2009  . History of pneumonia as a child   . Hypercholesteremia   . Hypothyroidism   . RLS (restless legs syndrome)   . Sleep apnea    does not use a cpap,primarily with upper airway resistance syndrome AHI 2.35/hr, RDI 10.2/hr ([name])  . Urinary frequency   . Wears glasses   . Wears hearing aid    both ears    Medications:  Scheduled:  . acetaminophen  1,000 mg Oral Q6H   Or  . acetaminophen (TYLENOL) oral liquid 160 mg/5 mL  1,000 mg Per Tube Q6H  . aspirin EC  325 mg Oral Daily   Or  . aspirin  324 mg Per Tube Daily  . atorvastatin  80 mg Oral Daily  . bisacodyl  10 mg Oral Daily   Or  . bisacodyl  10 mg Rectal Daily  . canagliflozin  100 mg Oral Daily  . Chlorhexidine Gluconate Cloth  6 each Topical Daily  . docusate sodium  200 mg Oral Daily  . doxazosin  2 mg Oral q morning - 10a  . furosemide  40 mg Oral Daily  . insulin aspart  0-15 Units Subcutaneous TID WC  . insulin aspart  0-5 Units Subcutaneous QHS  .  insulin detemir  10 Units Subcutaneous Daily  . levothyroxine  75 mcg Oral Q0600  . linagliptin  5 mg Oral Daily  . metoprolol tartrate  12.5 mg Oral BID   Or  . metoprolol tartrate  12.5 mg Per Tube BID  . pantoprazole  40 mg Oral Daily  . potassium chloride SA  20 mEq Oral Daily  . pramipexole  0.125 mg Oral QHS  . rOPINIRole  2 mg Oral QHS  . sodium chloride flush  3 mL Intravenous Q12H    Assessment: 56 yoM starting apixaban for new-onset post-op Afib. PMH s/f aortic valve stenosis and also takes aspirin. CHADSVASC 4. Hgb low 8-9, PLT 119.   Goal of Therapy:  Monitor platelets by anticoagulation protocol: Yes   Plan:  Start apixaban 5mg  BID Monitor CBC, s/sx bleeding Counsel prior to discharge   Mercy Riding, PharmD PGY1 [place] Resident Please refer to [facility] for unit-specific pharmacist

## 2019-12-25 NOTE — Progress Notes (Addendum)
[address]       [place], [address]             [phone]      4 Days Post-Op Procedure(s) (LRB): CORONARY ARTERY BYPASS GRAFTING TIMES FIVE USING LEFT INTERNAL [employer] ARTERY AND ENDOSCOPICALLY HARVESTED RIGHT GREATER SAPHENOUS VEIN. (N/A) AORTIC VALVE REPLACEMENT WITH INSPIRIS RESILIA AORTIC VALVE SIZE 23MM (N/A) TRANSESOPHAGEAL ECHOCARDIOGRAM (TEE) (N/A) ENDOVEIN HARVEST OF RIGHT GREATER SAPHENOUS VEIN Subjective: Up walking around in his room this morning.  Feels like he is progressing and is anxious to go home.   Had a few mor episodes of PAF, Eliquis started this morning by Dr. Orvan Seen. He remains on amiodarone infusion.    Objective: Vital signs in last 24 hours: Temp:  [98.2 F (36.8 C)-98.8 F (37.1 C)] 98.2 F (36.8 C) (10/10 0315) Pulse Rate:  [70-100] 91 (10/10 0730) Cardiac Rhythm: Normal sinus rhythm (10/10 1018) Resp:  [17-19] 19 (10/10 0730) BP: (97-123)/(48-80) 121/80 (10/10 0730) SpO2:  [96 %-99 %] 98 % (10/10 0730) Weight:  [71.8 kg] 71.8 kg (10/10 0610)    Intake/Output from previous day: 10/09 0701 - 10/10 0700 In: 445.6 [P.O.:200; I.V.:245.6] Out: -  Intake/Output this shift: No intake/output data recorded.  General appearance: alert, cooperative and no distress Neurologic: intact Heart: Currently in SR. Lungs: Breath sounds clear. Sats good on RA.  Abdomen: soft, NT Extremities: no edema, EVH dressing is dry Wound: the sternal dressing was removed and the incision is intact and dry.   Lab Results: Recent Labs    12/23/19 0350 12/25/19 0109  WBC 8.8 6.7  HGB 9.1* 8.6*  HCT 28.3* 25.5*  PLT 96* 119*   BMET:  Recent Labs    12/23/19 0350 12/25/19 0109  NA 134* 137  K 4.6 3.8  CL 104 102  CO2 23 24  GLUCOSE 127* 152*  BUN 19 19  CREATININE 0.88 1.10  CALCIUM 8.4* 8.3*    PT/INR: No results for input(s): LABPROT, INR in the last 72 hours. ABG    Component Value Date/Time   PHART 7.357 12/21/2019  1945   HCO3 20.8 12/21/2019 1945   TCO2 22 12/21/2019 1945   ACIDBASEDEF 4.0 (H) 12/21/2019 1945   O2SAT 97.0 12/21/2019 1945   CBG (last 3)  Recent Labs    12/24/19 2116 12/25/19 0618 12/25/19 1121  GLUCAP 107* 155* 148*   EXAM: CHEST - 2 VIEW  COMPARISON:  December 23, 2019  FINDINGS: The cardiomediastinal silhouette is unchanged in contour.Status post median sternotomy and CABG as well as aortic valve replacement. Removal of RIGHT IJ CVC sheath. Small bilateral pleural effusions. No pneumothorax. Temporary cardiac pacing wires. Patchy bibasilar heterogeneous opacities, unchanged and likely reflecting atelectasis. Visualized abdomen is unremarkable. Multilevel degenerative changes of the thoracic spine.  IMPRESSION: Small bilateral pleural effusions and bibasilar atelectasis, unchanged.   Electronically Signed   By: Valentino Saxon MD   On: 12/25/2019 11:43   Assessment/Plan: S/P Procedure(s) (LRB): CORONARY ARTERY BYPASS GRAFTING TIMES FIVE USING LEFT INTERNAL [employer] ARTERY AND ENDOSCOPICALLY HARVESTED RIGHT GREATER SAPHENOUS VEIN. (N/A) AORTIC VALVE REPLACEMENT WITH INSPIRIS RESILIA AORTIC VALVE SIZE 23MM (N/A) TRANSESOPHAGEAL ECHOCARDIOGRAM (TEE) (N/A) ENDOVEIN HARVEST OF RIGHT GREATER SAPHENOUS VEIN  -POD-4 CABG for NSTEMI- progressing well, independent with mobility. Good oxygenation on RA. Continue ASA, metoprolol, Lipitor. D/C pacer wires.   -Post-op atrial fibrillation- currently in SR on IV amiodarone but has had several episodes of PAF. Eliquis started this AM. Will also convert  to oral amio since he only has a PIV.  -Type 2 DM- glucose control adequate on Levemir, SSI, and Tradjenta.   -Volume Excess- Wt ~3kg +. Continue diuresis.   -Disposition- anticipate discharge to home in 1-2 days if rhythm stable.     LOS: 8 days    Antony Odea, PA-C [phone] 12/25/2019 Pt seen and examined; agree with documentation. In/out AF, will  start Eliquis Saba Gomm Z. Orvan Seen, [place]

## 2019-12-25 NOTE — Plan of Care (Signed)

## 2019-12-25 NOTE — Progress Notes (Signed)
Pt ambulating hall with wife.  Appears to have converted to NSR during walk.  Will get EKG to confirm

## 2019-12-25 NOTE — Progress Notes (Signed)
Pacer wires pulled per order, all ends intact. Patient reminded to lay supine for an hour. BP 115/51, HR 77, O2 93%. Will monitor patient  Donnelly Angelica, RN

## 2019-12-25 NOTE — Progress Notes (Signed)
Progress Note  Patient Name: Steven Lawrence Date of Encounter: 12/25/2019  [facility] Cardiologist: Fransico Him, MD   Subjective   69 yo M with a history of Tobacco Abuse, HTN, HLD, T2DM with moderate AS and Multivessel CAD s/p 5 vessel bypass (LIMA to LAD, SVG to D1, OM2 X 2, PDA) and Inspiris 23 mm AV.    To floor 12/24/19.  Notable post operative atrial fibrillation, started on amiodarone 12/23/19.  No palpitations.  Patient feels well without CP and is looking forward to dc.  Inpatient Medications    Scheduled Meds: . acetaminophen  1,000 mg Oral Q6H   Or  . acetaminophen (TYLENOL) oral liquid 160 mg/5 mL  1,000 mg Per Tube Q6H  . aspirin EC  325 mg Oral Daily   Or  . aspirin  324 mg Per Tube Daily  . atorvastatin  80 mg Oral Daily  . bisacodyl  10 mg Oral Daily   Or  . bisacodyl  10 mg Rectal Daily  . canagliflozin  100 mg Oral Daily  . Chlorhexidine Gluconate Cloth  6 each Topical Daily  . docusate sodium  200 mg Oral Daily  . doxazosin  2 mg Oral q morning - 10a  . furosemide  40 mg Oral Daily  . insulin aspart  0-15 Units Subcutaneous TID WC  . insulin aspart  0-5 Units Subcutaneous QHS  . insulin detemir  10 Units Subcutaneous Daily  . levothyroxine  75 mcg Oral Q0600  . linagliptin  5 mg Oral Daily  . metoprolol tartrate  12.5 mg Oral BID   Or  . metoprolol tartrate  12.5 mg Per Tube BID  . pantoprazole  40 mg Oral Daily  . potassium chloride SA  20 mEq Oral Daily  . pramipexole  0.125 mg Oral QHS  . rOPINIRole  2 mg Oral QHS  . sodium chloride flush  3 mL Intravenous Q12H   Continuous Infusions: . sodium chloride Stopped (12/22/19 0757)  . sodium chloride 50 mL (12/21/19 1508)  . sodium chloride 10 mL/hr at 12/21/19 1524  . amiodarone 30 mg/hr (12/25/19 0309)  . lactated ringers    . lactated ringers    . lactated ringers Stopped (12/22/19 1135)  . nitroGLYCERIN    . phenylephrine (NEO-SYNEPHRINE) Adult infusion Stopped (12/22/19 0722)   PRN  Meds: sodium chloride, dextrose, lactated ringers, metoprolol tartrate, midazolam, morphine injection, ondansetron (ZOFRAN) IV, oxyCODONE, sodium chloride flush, traMADol   Vital Signs    Vitals:   12/25/19 0300 12/25/19 0315 12/25/19 0610 12/25/19 0730  BP:  104/68  121/80  Pulse:  100  91  Resp: 17 17  19   Temp:  98.2 F (36.8 C)    TempSrc:  Oral    SpO2:  97%  98%  Weight:   71.8 kg   Height:        Intake/Output Summary (Last 24 hours) at 12/25/2019 0918 Last data filed at 12/25/2019 0500 Gross per 24 hour  Intake 413.25 ml  Output --  Net 413.25 ml   Last 3 Weights 12/25/2019 12/24/2019 12/23/2019  Weight (lbs) 158 lb 4.6 oz 159 lb 6.3 oz 162 lb 11.2 oz  Weight (kg) 71.8 kg 72.3 kg 73.8 kg      Telemetry    Sinus bradycardia to atrial fibrillation, presently controlled to 94 bpm- Personally Reviewed  ECG    12/22/19 SR RBBB without q waves  Physical Exam   GEN: No acute distress.  Able to lie flat  with no issues Neck: No JVD Cardiac: RRR with normal S1 and S2., 3/6 SEM at RUSB.  No rubs, or gallops.  Respiratory: Clear to auscultation bilaterally. GI: Soft, nontender, non-distended  Skin: Paceer and CABG site c/d/i without soaked dressings Neuro:  Nonfocal  Psych: Normal affect   Labs    High Sensitivity Troponin:   Recent Labs  Lab 12/16/19 2043 12/16/19 2240 12/17/19 0215 12/17/19 0553  TROPONINIHS 13 59* 108* 136*      Chemistry Recent Labs  Lab 12/19/19 0425 12/19/19 0425 12/20/19 0537 12/21/19 0908 12/22/19 1622 12/23/19 0350 12/25/19 0109  NA 138   < > 137   < > 133* 134* 137  K 3.7   < > 3.7   < > 4.8 4.6 3.8  CL 107   < > 105   < > 104 104 102  CO2 22   < > 22   < > 21* 23 24  GLUCOSE 135*   < > 124*   < > 206* 127* 152*  BUN 19   < > 14   < > 18 19 19   CREATININE 0.94   < > 0.93   < > 1.12 0.88 1.10  CALCIUM 8.9   < > 9.1   < > 8.4* 8.4* 8.3*  GFRNONAA >60   < > >60   < > >60 >60 >60  GFRAA >60  --  >60  --   --   --   --    ANIONGAP 9   < > 10   < > 8 7 11    < > = values in this interval not displayed.     Hematology Recent Labs  Lab 12/22/19 1622 12/23/19 0350 12/25/19 0109  WBC 8.8 8.8 6.7  RBC 2.94* 2.86* 2.63*  HGB 9.7* 9.1* 8.6*  HCT 29.3* 28.3* 25.5*  MCV 99.7 99.0 97.0  MCH 33.0 31.8 32.7  MCHC 33.1 32.2 33.7  RDW 14.6 14.6 14.6  PLT 102* 96* 119*    Radiology    No results found.  Cardiac Studies    Echocardiogram 12/17/2019: LVEF 60 to 65%.  No R WMA.  Trivial MR.  Moderate aortic valve calcification with moderate aortic valve stenosis.  Normal RAP  Preliminary cath report 12/19/2019: Occluded RCA with left-to-right collaterals; diffuse moderate to severe mid LAD stenosis with severe proximal and mid LCx disease (just after AV groove branch that gives rise to L-R collaterals) --> MV CAD with moderate AS, recommend CABG (potential CABG AVR)   Patient Profile     69 y.o. male with a history of hyperlipidemia, diabetes mellitus, tobacco abuse, restless leg syndrome, BPH, and depression who was admitted with NSTEMI on 12/16/2019 after presenting with chest pain.  Assessment & Plan   Post operative atrial fibrillation  - will repeat EKG - reasonable to transition amiodarone to 400 mg TID for one week then 200 mg daily - CHADSVASC 4; AC when appropriate as per primary   CAD and NSTEMI s/p CABG and essential HT  Continue ASA  Baseline sinus brady 12.5 mg metoprolol tartrate reasonable while on amiodarone, but can increase if new rate control issues occur  Continue lisinopril 10 mg  Moderate Aortic Stenosis  Post Bioprosthetic valve  AS and AC per surgical team  Hyperlipidemia  Atorvastatin 80 mg; will need outpatient LFT/Lipid follow up 6-8 weeks out, may need secondary agent  Diabetes Mellitus  Hemoglobin A1c 7.1 this admission.  Management per primary team   Will follow  along and assist with peri-discharge cardiac medication  For questions or updates, please  contact [place] Please consult [email or web address] for contact info under        Signed, Werner Lean, MD  12/25/2019, 9:18 AM

## 2019-12-26 LAB — CBC
HCT: 23.9 % — ABNORMAL LOW (ref 39.0–52.0)
Hemoglobin: 8.1 g/dL — ABNORMAL LOW (ref 13.0–17.0)
MCH: 32.9 pg (ref 26.0–34.0)
MCHC: 33.9 g/dL (ref 30.0–36.0)
MCV: 97.2 fL (ref 80.0–100.0)
Platelets: 140 10*3/uL — ABNORMAL LOW (ref 150–400)
RBC: 2.46 MIL/uL — ABNORMAL LOW (ref 4.22–5.81)
RDW: 14.6 % (ref 11.5–15.5)
WBC: 6.6 10*3/uL (ref 4.0–10.5)
nRBC: 0 % (ref 0.0–0.2)

## 2019-12-26 LAB — BASIC METABOLIC PANEL
Anion gap: 10 (ref 5–15)
BUN: 21 mg/dL (ref 8–23)
CO2: 24 mmol/L (ref 22–32)
Calcium: 8.3 mg/dL — ABNORMAL LOW (ref 8.9–10.3)
Chloride: 103 mmol/L (ref 98–111)
Creatinine, Ser: 1.05 mg/dL (ref 0.61–1.24)
GFR, Estimated: >60 mL/min (ref >60)
Glucose, Bld: 124 mg/dL — ABNORMAL HIGH (ref 70–99)
Potassium: 3.5 mmol/L (ref 3.5–5.1)
Sodium: 137 mmol/L (ref 135–145)

## 2019-12-26 LAB — GLUCOSE, CAPILLARY: Glucose-Capillary: 141 mg/dL — ABNORMAL HIGH (ref 70–99)

## 2019-12-26 MED ORDER — ASPIRIN EC 81 MG PO TBEC
81.0000 mg | DELAYED_RELEASE_TABLET | Freq: Every day | ORAL | Status: DC
  Administered 2019-12-26: 81 mg via ORAL
  Filled 2019-12-26: qty 1

## 2019-12-26 MED ORDER — METOPROLOL TARTRATE 25 MG PO TABS: 25.0000 mg | ORAL_TABLET | Freq: Two times a day (BID) | ORAL | 2 refills | Status: DC

## 2019-12-26 MED ORDER — POTASSIUM CHLORIDE CRYS ER 20 MEQ PO TBCR: 20.0000 meq | EXTENDED_RELEASE_TABLET | Freq: Every day | ORAL | 0 refills | Status: DC

## 2019-12-26 MED ORDER — AMIODARONE HCL 200 MG PO TABS: 400.0000 mg | ORAL_TABLET | Freq: Two times a day (BID) | ORAL | 1 refills | Status: DC

## 2019-12-26 MED ORDER — FUROSEMIDE 40 MG PO TABS
40.0000 mg | ORAL_TABLET | Freq: Every day | ORAL | 0 refills | Status: DC
Start: 2019-12-27 — End: 2020-01-10

## 2019-12-26 MED ORDER — ASPIRIN 81 MG PO TBEC: 81.0000 mg | DELAYED_RELEASE_TABLET | Freq: Every day | ORAL | 11 refills | Status: AC

## 2019-12-26 MED ORDER — APIXABAN 5 MG PO TABS
5.0000 mg | ORAL_TABLET | Freq: Two times a day (BID) | ORAL | 2 refills | Status: DC
Start: 2019-12-26 — End: 2020-02-14

## 2019-12-26 MED ORDER — TRAMADOL HCL 50 MG PO TABS
50.0000 mg | ORAL_TABLET | Freq: Four times a day (QID) | ORAL | 0 refills | Status: AC | PRN
Start: 2019-12-26 — End: 2020-01-02

## 2019-12-26 MED ORDER — METFORMIN HCL 500 MG PO TABS
1000.0000 mg | ORAL_TABLET | Freq: Two times a day (BID) | ORAL | Status: DC
  Administered 2019-12-26: 1000 mg via ORAL
  Filled 2019-12-26: qty 2

## 2019-12-26 MED ORDER — POTASSIUM CHLORIDE CRYS ER 20 MEQ PO TBCR
40.0000 meq | EXTENDED_RELEASE_TABLET | Freq: Every day | ORAL | Status: DC
  Administered 2019-12-26: 40 meq via ORAL
  Filled 2019-12-26: qty 2

## 2019-12-26 NOTE — Discharge Instructions (Signed)
[facility] [phone]   Surgical Aortic Valve Replacement, Care After This sheet gives you information about how to care for yourself after your procedure. Your health care provider may also give you more specific instructions. If you have problems or questions, contact your health care provider. What can I expect after the procedure? After the procedure, it is common to have pain around your incision area. Follow these instructions at home: Medicines  Take over-the-counter and prescription medicines only as told by your health care provider.  If you were prescribed an antibiotic medicine, take it as told by your health care provider. Do not stop taking the antibiotic even if you start to feel better.  If you have a mechanical prosthesis, you may be given a blood thinner called warfarin. Follow instructions carefully on how to take this medicine.  Ask your health care provider if the medicine prescribed to you: ? Requires you to avoid driving or using heavy machinery. ? Can cause constipation. You may need to take actions to prevent or treat constipation, such as:  Take over-the-counter or prescription medicines.  Eat foods that are high in fiber, such as beans, whole grains, and fresh fruits and vegetables.  Limit foods that are high in fat and processed sugars, such as fried or sweet foods. Eating and drinking      Limit how much caffeine you drink. Caffeine can affect your heart's rate and rhythm.  Do not drink alcohol if: ? Your health care provider tells you not to drink. ? You are pregnant, may be pregnant, or are planning to become pregnant.  Drink enough fluid to keep your urine pale yellow.  Eat a heart-healthy diet that includes fruits, vegetables, whole grains, low-fat dairy products, and lean proteins like poultry and eggs. Incision care   Follow instructions from your health care provider about how to take care of your incision. Make sure you: ? Wash your hands  with soap and water before and after you change your bandage (dressing). If soap and water are not available, use hand sanitizer. ? Change your dressing as told by your health care provider. ? Leave stitches (sutures), skin glue, or adhesive strips in place. These skin closures may need to stay in place for 2 weeks or longer. If adhesive strip edges start to loosen and curl up, you may trim the loose edges. Do not remove adhesive strips completely unless your health care provider tells you to do that.  Check your incision area every day for signs of infection. Check for: ? Redness, swelling, or increasing pain. ? Fluid or blood. ? Warmth. ? Pus or a bad smell. Activity  Return to your normal activities as told by your health care provider. Most patients will need to limit any lifting or strenuous activity for 4-6 weeks.  Avoid sitting for a long time without moving. Get up to take short walks every 1-2 hours.  Do exercises as told by your health care provider.  Do not lift anything that is heavier than 10 lb (4.5 kg), or the limit that you are told, until your health care provider says that it is safe.  Avoid pushing or pulling things with your arms until your health care provider approves. This includes pulling on handrails to help you climb stairs. Lifestyle  Do not use any products that contain nicotine or tobacco, such as cigarettes, e-cigarettes, and chewing tobacco. These can delay incision healing after surgery. If you need help quitting, ask your health care provider.  Resume sexual activity as told by your health care provider. If you have erectile dysfunction, do not use medicines to treat this condition unless your health care provider approves.  Work with your health care provider to: ? Keep your blood pressure and cholesterol under control. ? Manage any other heart conditions that you have. ? Maintain a healthy weight. Driving and travel  Do not drive until your health  care provider approves. Ask your health care provider when it is safe for you to drive.  Avoid airplane travel for as long as told by your health care provider.  When you travel, bring a list of your medicines and a record of your medical history with you. Carry your medicines with you. General instructions  Do not take baths, swim, or use a hot tub until your health care provider approves.You may shower  Do not strain to have a bowel movement.  Avoid crossing your legs while sitting down.  Check your temperature every day for a fever. A fever may be a sign of infection.  If you are a woman and you plan to become pregnant, talk with your health care provider before you become pregnant.  Wear compression stockings as told by your health care provider. These stockings help to prevent blood clots and reduce swelling in your legs.  Tell all health care providers who care for you that you have an artificial (prosthetic) aortic valve. Also, tell them if you have or have had heart disease or endocarditis.  You will be given a card at discharge. The card indicates the type of prosthetic valve that you have. Keep this card in your wallet or purse for quick reference in case of an emergency.  Keep all follow-up visits as told by your health care provider. This is important. Contact a health care provider if:  You develop a skin rash.  You experience sudden, unexplained changes in your weight.  You have redness, swelling, or increasing pain around your incision.  You have fluid or blood coming from your incision.  Your incision feels warm to the touch.  You have pus or a bad smell coming from your incision.  You have a fever. Get help right away if you:  Develop chest pain that is different from the pain coming from your incision.  Develop shortness of breath or difficulty breathing.  Start to feel light-headed. These symptoms may represent a serious problem that is an emergency.  Do not wait to see if the symptoms will go away. Get medical help right away. Call your local emergency services (911 in the [place] Do not drive yourself to the hospital. Summary  After this procedure, it is common to have pain in the incision area.  Eat a heart-healthy diet. Follow instructions about alcohol use.  Get up to walk often. Avoid pushing and pulling with your arms. Ask what activities are safe for you.  Care for your incision as told by your health care provider. Check it daily for signs of infection.  Get help right away if you have chest pain that is different from your incisions, develop shortness of breath or difficulty breathing, or start to feel light-headed. This information is not intended to replace advice given to you by your health care provider. Make sure you discuss any questions you have with your health care provider. Document Revised: 11/26/2017 Document Reviewed: 11/26/2017 Elsevier Patient Education  [place]. Coronary Artery Bypass Grafting, Care After This sheet gives you information about how to  care for yourself after your procedure. Your doctor may also give you more specific instructions. If you have problems or questions, call your doctor. What can I expect after the procedure? After the procedure, it is common to:  Feel sick to your stomach (nauseous).  Not want to eat as much as normal (lack of appetite).  Have trouble pooping (constipation).  Have weakness and tiredness (fatigue).  Feel sad (depressed) or grouchy (irritable).  Have pain or discomfort around the cuts from surgery (incisions). Follow these instructions at home: Medicines  Take over-the-counter and prescription medicines only as told by your doctor. Do not stop taking medicines or start any new medicines unless your doctor says it is okay.  If you were prescribed an antibiotic medicine, take it as told by your doctor. Do not stop taking the antibiotic even if you  start to feel better. Incision care   Follow instructions from your doctor about how to take care of your cuts from surgery. Make sure you: ? Wash your hands with soap and water before and after you change your bandage (dressing). If you cannot use soap and water, use hand sanitizer. ? Change your bandage as told by your doctor. ? Leave stitches (sutures), skin glue, or skin tape (adhesive) strips in place. They may need to stay in place for 2 weeks or longer. If tape strips get loose and curl up, you may trim the loose edges. Do not remove tape strips completely unless your doctor says it is okay.  Make sure the surgery cuts are clean, dry, and protected.  Check your cut areas every day for signs of infection. Check for: ? More redness, swelling, or pain. ? More fluid or blood. ? Warmth. ? Pus or a bad smell.  If cuts were made in your legs: ? Avoid crossing your legs. ? Avoid sitting for long periods of time. Change positions every 30 minutes. ? Raise (elevate) your legs when you are sitting. Bathing  Do not take baths, swim, or use a hot tub until your doctor says it is okay.  You may shower.  Do not rub the cuts to dry.  Ask your doctor when you can shower. Eating and drinking   Eat foods that are high in fiber, such as beans, nuts, whole grains, and raw fruits and vegetables. Any meats you eat should be lean cut. Avoid canned, processed, and fried foods. This can help prevent trouble pooping. This is also a part of a heart-healthy diet.  Drink enough fluid to keep your pee (urine) pale yellow.  Do not drink alcohol until you are fully recovered. Ask your doctor when it is safe to drink alcohol. Activity  Rest and limit your activity as told by your doctor. You may be told to: ? Stop any activity right away if you have chest pain, shortness of breath, irregular heartbeats, or dizziness. Get help right away if you have any of these symptoms. ? Move around often for short  periods or take short walks as told by your doctor. Slowly increase your activities. ? Avoid lifting, pushing, or pulling anything that is heavier than 10 lb (4.5 kg) for at least 6 weeks or as told by your doctor.  Do physical therapy or a cardiac rehab (cardiac rehabilitation) program as told by your doctor. ? Physical therapy involves doing exercises to maintain movement and build strength and endurance. ? A cardiac rehab program includes:  Exercise training.  Education.  Counseling.  Do not drive until  your doctor says it is okay.  Ask your doctor when you can go back to work.  Ask your doctor when you can be sexually active. General instructions  Do not drive or use heavy machinery while taking prescription pain medicine.  Do not use any products that contain nicotine or tobacco. These include cigarettes, e-cigarettes, and chewing tobacco. If you need help quitting, ask your doctor.  Take 2-3 deep breaths every few hours during the day while you get better. This helps expand your lungs and prevent problems.  If you were given a device called an incentive spirometer, use it several times a day to practice deep breathing. Support your chest with a pillow or your arms when you take deep breaths or cough.  Wear compression stockings as told by your doctor.  Weigh yourself every day. This helps to see if your body is holding (retaining) fluid that may make your heart and lungs work harder.  Keep all follow-up visits as told by your doctor. This is important. Contact a doctor if:  You have more redness, swelling, or pain around any cut.  You have more fluid or blood coming from any cut.  Any cut feels warm to the touch.  You have pus or a bad smell coming from any cut.  You have a fever.  You have swelling in your ankles or legs.  You have pain in your legs.  You gain 2 lb (0.9 kg) or more a day.  You feel sick to your stomach or you throw up (vomit).  You have  watery poop (diarrhea). Get help right away if:  You have chest pain that goes to your jaw or arms.  You are short of breath.  You have a fast or irregular heartbeat.  You notice a "clicking" in your breastbone (sternum) when you move.  You have any signs of a stroke. "BE FAST" is an easy way to remember the main warning signs: ? B - Balance. Signs are dizziness, sudden trouble walking, or loss of balance. ? E - Eyes. Signs are trouble seeing or a change in how you see. ? F - Face. Signs are sudden weakness or loss of feeling of the face, or the face or eyelid drooping on one side. ? A - Arms. Signs are weakness or loss of feeling in an arm. This happens suddenly and usually on one side of the body. ? S - Speech. Signs are sudden trouble speaking, slurred speech, or trouble understanding what people say. ? T - Time. Time to call emergency services. Write down what time symptoms started.  You have other signs of a stroke, such as: ? A sudden, very bad headache with no known cause. ? Feeling sick to your stomach. ? Throwing up. ? Jerky movements you cannot control (seizure). These symptoms may be an emergency. Do not wait to see if the symptoms will go away. Get medical help right away. Call your local emergency services (911 in the [place] Do not drive yourself to the hospital. Summary  After the procedure, it is common to have pain or discomfort in the cuts from surgery (incisions).  Do not take baths, swim, or use a hot tub until your doctor says it is okay.  Slowly increase your activities. You may need physical therapy or cardiac rehab.  Weigh yourself every day. This helps to see if your body is holding fluid. This information is not intended to replace advice given to you by your health care provider.  Make sure you discuss any questions you have with your health care provider. Document Revised: 11/10/2017 Document Reviewed: 11/10/2017 Elsevier Patient Education  2020 [place] on my medicine - ELIQUIS (apixaban)  This medication education was reviewed with me or my healthcare representative as part of my discharge preparation.  The pharmacist that spoke with me during my hospital stay was:  Einar Grad, [facility]  Why was Eliquis prescribed for you? Eliquis was prescribed for you to reduce the risk of a blood clot forming that can cause a stroke if you have a medical condition called atrial fibrillation (a type of irregular heartbeat).  What do You need to know about Eliquis ? Take your Eliquis TWICE DAILY - one tablet in the morning and one tablet in the evening with or without food. If you have difficulty swallowing the tablet whole please discuss with your pharmacist how to take the medication safely.  Take Eliquis exactly as prescribed by your doctor and DO NOT stop taking Eliquis without talking to the doctor who prescribed the medication.  Stopping may increase your risk of developing a stroke.  Refill your prescription before you run out.  After discharge, you should have regular check-up appointments with your healthcare provider that is prescribing your Eliquis.  In the future your dose may need to be changed if your kidney function or weight changes by a significant amount or as you get older.  What do you do if you miss a dose? If you miss a dose, take it as soon as you remember on the same day and resume taking twice daily.  Do not take more than one dose of ELIQUIS at the same time to make up a missed dose.  Important Safety Information A possible side effect of Eliquis is bleeding. You should call your healthcare provider right away if you experience any of the following: ? Bleeding from an injury or your nose that does not stop. ? Unusual colored urine (red or dark brown) or unusual colored stools (red or black). ? Unusual bruising for unknown reasons. ? A serious fall or if you hit your head (even if there is no  bleeding).  Some medicines may interact with Eliquis and might increase your risk of bleeding or clotting while on Eliquis. To help avoid this, consult your healthcare provider or pharmacist prior to using any new prescription or non-prescription medications, including herbals, vitamins, non-steroidal anti-inflammatory drugs (NSAIDs) and supplements.  This website has more information on Eliquis (apixaban): http://www.eliquis.com/eliquis/home

## 2019-12-26 NOTE — Progress Notes (Signed)
5 Days Post-Op Procedure(s) (LRB): CORONARY ARTERY BYPASS GRAFTING TIMES FIVE USING LEFT INTERNAL [employer] ARTERY AND ENDOSCOPICALLY HARVESTED RIGHT GREATER SAPHENOUS VEIN. (N/A) AORTIC VALVE REPLACEMENT WITH INSPIRIS RESILIA AORTIC VALVE SIZE 23MM (N/A) TRANSESOPHAGEAL ECHOCARDIOGRAM (TEE) (N/A) ENDOVEIN HARVEST OF RIGHT GREATER SAPHENOUS VEIN Subjective: No complaints this AM  Objective: Vital signs in last 24 hours: Temp:  [97.7 F (36.5 C)-98.5 F (36.9 C)] 98.1 F (36.7 C) (10/11 0543) Pulse Rate:  [67-78] 67 (10/11 0543) Cardiac Rhythm: Normal sinus rhythm (10/11 0752) Resp:  [16-20] 18 (10/11 0543) BP: (108-124)/(49-69) 124/69 (10/11 0543) SpO2:  [94 %-100 %] 97 % (10/11 0543) Weight:  [71.6 kg] 71.6 kg (10/11 0543)  Hemodynamic parameters for last 24 hours:    Intake/Output from previous day: 10/10 0701 - 10/11 0700 In: 200 [P.O.:200] Out: -  Intake/Output this shift: No intake/output data recorded.  General appearance: alert, cooperative and no distress Neurologic: intact Heart: regular rate and rhythm Lungs: diminished breath sounds bibasilar Extremities: edema trace Wound: clean and dry  Lab Results: Recent Labs    12/25/19 0109 12/26/19 0216  WBC 6.7 6.6  HGB 8.6* 8.1*  HCT 25.5* 23.9*  PLT 119* 140*   BMET:  Recent Labs    12/25/19 0109 12/26/19 0216  NA 137 137  K 3.8 3.5  CL 102 103  CO2 24 24  GLUCOSE 152* 124*  BUN 19 21  CREATININE 1.10 1.05  CALCIUM 8.3* 8.3*    PT/INR: No results for input(s): LABPROT, INR in the last 72 hours. ABG    Component Value Date/Time   PHART 7.357 12/21/2019 1945   HCO3 20.8 12/21/2019 1945   TCO2 22 12/21/2019 1945   ACIDBASEDEF 4.0 (H) 12/21/2019 1945   O2SAT 97.0 12/21/2019 1945   CBG (last 3)  Recent Labs    12/25/19 1642 12/25/19 2203 12/26/19 0608  GLUCAP 165* 205* 141*    Assessment/Plan: S/P Procedure(s) (LRB): CORONARY ARTERY BYPASS GRAFTING TIMES FIVE USING LEFT INTERNAL [employer]  ARTERY AND ENDOSCOPICALLY HARVESTED RIGHT GREATER SAPHENOUS VEIN. (N/A) AORTIC VALVE REPLACEMENT WITH INSPIRIS RESILIA AORTIC VALVE SIZE 23MM (N/A) TRANSESOPHAGEAL ECHOCARDIOGRAM (TEE) (N/A) ENDOVEIN HARVEST OF RIGHT GREATER SAPHENOUS VEIN Plan for discharge: see discharge orders  Doing well No atrial fib for almost 24 hours now Plan to dc today on ASA (changed to 81 mg) and Eliquis Continue amiodarone and metoprolol Still above baseline weight- will continue Lasix and potassium x 5 more days   LOS: 9 days    Melrose Nakayama 12/26/2019

## 2019-12-26 NOTE — Progress Notes (Signed)
Pt received discharge instructions and currently does not have any questions or concerns. Pt is alert and oriented, reports he feels well and ready for discharge.

## 2019-12-26 NOTE — Progress Notes (Signed)
Pt ambulated 800 ft in RA without any assistive device, tolerated well. Hr 80's NSR while walking. Will continue to monitor.

## 2019-12-26 NOTE — Care Management Important Message (Signed)
Important Message  Patient Details  Name: Steven Lawrence MRN: [id number] Date of Birth: 1950-10-04   Medicare Important Message Given:  Yes     Shelda Altes 12/26/2019, 12:23 PM

## 2019-12-28 ENCOUNTER — Other Ambulatory Visit: Payer: Self-pay | Admitting: Physician Assistant

## 2019-12-28 ENCOUNTER — Telehealth: Payer: Self-pay | Admitting: *Deleted

## 2019-12-28 MED ORDER — ONDANSETRON HCL 8 MG PO TABS: 8.0000 mg | ORAL_TABLET | Freq: Three times a day (TID) | ORAL | 0 refills | Status: DC | PRN

## 2019-12-28 NOTE — Telephone Encounter (Signed)
Mr. Salvetti wife, Marcie Bal, contacted the office stating Mr. Nack was nauseous and had vomited bile like substances today. She stated he had only been able to eat half a ham and cheese sandwich but had been nauseous otherwise. He has not had any pain medication today. His reported blood pressure was 124/86, HR 70. She was concerned about him retching to vomit being he had bypass surgery 10/6. Pt reports passing gas without a bowel movement. E. Barrett, PA was contacted. Pt was advised to decrease his Amiodarone to 200mg  PO BID for 5 days then to 1 tablet everyday. A prescription for Zofran 8mg  was also sent in to pts preferred pharmacy [facility] on [address]. [place] for PRN nausea. Pt himself understands receipt. All questions answered.

## 2020-01-04 ENCOUNTER — Telehealth: Payer: Self-pay | Admitting: Cardiology

## 2020-01-04 DIAGNOSIS — R609 Edema, unspecified: Secondary | ICD-10-CM

## 2020-01-04 MED ORDER — FUROSEMIDE 20 MG PO TABS: 20.0000 mg | ORAL_TABLET | Freq: Every day | ORAL | 3 refills | Status: DC

## 2020-01-04 NOTE — Telephone Encounter (Signed)
Pt experiencing weight gain of 8 lbs over the last 1.5 weeks. Pt due to be seen by Sharrell Ku, PA-C on 10/26 and Dr. Roxan Hockey on 11/2. Will seek further instructions from Dr. Stanford Breed, who saw pt during hospitalization.

## 2020-01-04 NOTE — Telephone Encounter (Signed)
Pt c/o swelling: STAT is pt has developed SOB within 24 hours  1) How much weight have you gained and in what time span? 8 lbs in 1.5 weeks  2) If swelling, where is the swelling located? foot and ankle swelling (mainly in right foot/ankle)  3) Are you currently taking a fluid pill? Yes   4) Are you currently SOB? No   5) Do you have a/ log of your daily weights (if so, list)?  01/02/20: 159 lbs       01/04/20: 163 lbs  6) Have you gained 3 pounds in a day or 5 pounds in a week? Yes   7) Have you traveled recently? No

## 2020-01-04 NOTE — Telephone Encounter (Signed)
I have not seen this patient since 2014 and at that time only for OSA.  He saw Dr. Stanford Breed recently in hospital so I would recommend he followup with him and try to get him into office to see him or a PA at [facility]

## 2020-01-04 NOTE — Telephone Encounter (Signed)
Lasix 20 mg daily; bmet one week Kipper Buch  

## 2020-01-04 NOTE — Telephone Encounter (Signed)
Spoke with pt, Aware of dr crenshaw's recommendations. New script sent to the pharmacy and Lab orders mailed to the pt  

## 2020-01-04 NOTE — Telephone Encounter (Signed)
I have not met patient before or cared for him so would recommend reaching out to provider who has seen patient, thanks!

## 2020-01-05 ENCOUNTER — Encounter: Payer: Self-pay | Admitting: Physician Assistant

## 2020-01-05 NOTE — Progress Notes (Addendum)
Cardiology Office Note    Date:  01/10/2020   ID:  Steven Lawrence, DOB 1950-06-04, MRN [id number]  PCP:  Lennie Odor, PA  Cardiologist:  [name], MD  Electrophysiologist:  None   Chief Complaint: f/u CABG and AVR  History of Present Illness:   Steven Lawrence is a 69 y.o. male with history of tobacco abuse, type 2 diabetes, hyperlipidemia, hypothyroidism, heart murmur, sleep apnea, restless leg syndrome, arthritis, reflux, depression, recent NSTEMI with CAD and aortic stenosis s/p CABG and AVR 12/21/19 with post-op atrial fibrillation (baseline RBBB). He was recently hospitalized with chest pain and mild troponin elevation to 136. Cardiac cath demonstrated multivessel disease and workup also revealed moderately severe AS. He underwent CABGx5 (left internal mammary artery to left anterior descending, saphenous vein graft to first diagonal, sequential saphenous vein graft to obtuse marginals 2 and, saphenous vein graft to posterior descending) and aortic valve replacement with 23 mm Inspiris Resilia bovine pericardial valve. Post-op course was notable for PAF so he was started on amiodarone and Eliquis. He also received a short course of Lasix at dc. 2D echo 12/17/19 otherwise had shown normal EF 60-65% and mild LVH. Post procedure he called in with increased leg swelling and weight gain and was prescribed to restart Lasix at a lower dose of 20mg  daily.  He is seen back for follow-up with his wife doing much better. Edema has improved significantly (RLE, side of saphenous vein harvest). Overall he has no significant complaints. No angina, dyspnea, orthopnea, edema, dizziness, near-syncope, syncope, palpitations or bleeding. Incisions are healing well. There is some confusion with his medicine list. He does not believe he is on metoprolol as he does not recall this being on his hospital list but his AVS, DC summary, and rx were sent in with this on it. They also believe he is on atorvastatin 80mg   not 20mg . They will check when they get home today. Labs were obtained at PCP yesterday as below. His POx was 92% upon rooming today and 96% seated.  Labwork independently reviewed:  Labs reviewed from PCP visit yesterday include WBC 6.2, Hgb 11.2, Plt 541, K 4.4, Cr 0.97, Na 139, LFTs wnl, A1C 6.5, TSH 7.66.  12/2019 Hgb 8.1 and plt 140 post-op, K 3.5, Cr 1.05, glucose 124, Mg 2.1, LDL 66, A1C 7.1, LFTs Ok except Tprot 5.9   Past Medical History:  Diagnosis Date  . Acid reflux   . Arthritis   . BPH (benign prostatic hyperplasia)    mild  . CAD in native artery    a. NSTEMI 12/2019 s/p CABGx5 with bovine AVR.  Marland Kitchen Depression   . 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 History of pneumonia as a child   . Hypercholesteremia   . Hypothyroidism   . RLS (restless legs syndrome)   . S/P aortic valve replacement with bioprosthetic valve   . S/P CABG (coronary artery bypass graft)   . Sleep apnea    does not use a cpap,primarily with upper airway resistance syndrome AHI 2.35/hr, RDI 10.2/hr ([name])  . Urinary frequency   . Wears glasses   . Wears hearing aid    both ears    Past Surgical History:  Procedure Laterality Date  . ANTERIOR LAT LUMBAR FUSION Left 01/04/2015   Procedure: ANTERIOR LATERAL LUMBAR FUSION 1 LEVEL;  Surgeon: Phylliss Bob, MD;  Location: [place];  Service: Orthopedics;  Laterality: Left;  Left sided lateral interbody fusion lumbar  3-4 with instrumentation and allograft  . AORTIC VALVE REPLACEMENT N/A 12/21/2019   Procedure: AORTIC VALVE REPLACEMENT WITH INSPIRIS RESILIA AORTIC VALVE SIZE 23MM;  Surgeon: Melrose Nakayama, MD;  Location: [place];  Service: Open Heart Surgery;  Laterality: N/A;  . COLONOSCOPY    . CORONARY ARTERY BYPASS GRAFT N/A 12/21/2019   Procedure: CORONARY ARTERY BYPASS GRAFTING TIMES FIVE USING LEFT INTERNAL [employer] ARTERY AND ENDOSCOPICALLY HARVESTED RIGHT GREATER SAPHENOUS VEIN.;  Surgeon: Melrose Nakayama,  MD;  Location: [place];  Service: Open Heart Surgery;  Laterality: N/A;  . ENDOVEIN HARVEST OF GREATER SAPHENOUS VEIN  12/21/2019   Procedure: ENDOVEIN HARVEST OF RIGHT GREATER SAPHENOUS VEIN;  Surgeon: Melrose Nakayama, MD;  Location: [place];  Service: Open Heart Surgery;;  . ESOPHAGOGASTRODUODENOSCOPY    . INGUINAL HERNIA REPAIR Right 08/12/2012   Procedure: HERNIA REPAIR INGUINAL ADULT;  Surgeon: Imogene Burn. Georgette Dover, MD;  Location: [place];  Service: General;  Laterality: Right;  . INSERTION OF MESH Right 08/12/2012   Procedure: INSERTION OF MESH;  Surgeon: Imogene Burn. Georgette Dover, MD;  Location: [place];  Service: General;  Laterality: Right;  . LEFT HEART CATH AND CORONARY ANGIOGRAPHY N/A 12/19/2019   Procedure: LEFT HEART CATH AND CORONARY ANGIOGRAPHY;  Surgeon: Lorretta Harp, MD;  Location: [place] [facility];  Service: Cardiovascular;  Laterality: N/A;  . TEE WITHOUT CARDIOVERSION N/A 12/21/2019   Procedure: TRANSESOPHAGEAL ECHOCARDIOGRAM (TEE);  Surgeon: Melrose Nakayama, MD;  Location: [place];  Service: Open Heart Surgery;  Laterality: N/A;  . TONSILLECTOMY    . VASECTOMY  1985    Current Medications: Current Meds  Medication Sig  . amiodarone (PACERONE) 200 MG tablet Take 2 tablets (400 mg total) by mouth 2 (two) times daily. For 5 days then decrease to 1 tablet twice daily for 10 days then decrease to 1 tablet daily.  Marland Kitchen apixaban (ELIQUIS) 5 MG TABS tablet Take 1 tablet (5 mg total) by mouth 2 (two) times daily.  Marland Kitchen aspirin EC 81 MG EC tablet Take 1 tablet (81 mg total) by mouth daily. Swallow whole.  Marland Kitchen atorvastatin (LIPITOR) 20 MG tablet Take 20 mg by mouth daily.   . canagliflozin (INVOKANA) 100 MG TABS tablet Take 100 mg by mouth daily.  . cholestyramine light (PREVALITE) 4 GM/DOSE powder Take 1 g by mouth daily as needed (For stomach).  . doxazosin (CARDURA) 2 MG tablet Take 2 mg by mouth every morning. Takes for urinary freq  . furosemide  (LASIX) 20 MG tablet Take 1 tablet (20 mg total) by mouth daily.  Marland Kitchen LEVEMIR FLEXTOUCH 100 UNIT/ML FlexPen Inject 20 Units into the skin at bedtime.  Marland Kitchen levothyroxine (SYNTHROID, LEVOTHROID) 50 MCG tablet Take 50 mcg by mouth daily before breakfast.  . metFORMIN (GLUCOPHAGE) 1000 MG tablet Take 1,000 mg by mouth 2 (two) times daily with a meal.  . ondansetron (ZOFRAN) 8 MG tablet Take 1 tablet (8 mg total) by mouth every 8 (eight) hours as needed for nausea or vomiting.  . pramipexole (MIRAPEX) 0.125 MG tablet Take 0.125 mg by mouth as directed. RLS at Bedtime      Allergies:   Patient has no known allergies.   Social History   Socioeconomic History  . Marital status: Married    Spouse name: Not on file  . Number of children: Not on file  . Years of education: Not on file  . Highest education level: Not on file  Occupational History  .  Not on file  Tobacco Use  . Smoking status: Current Every Day Smoker    Packs/day: 1.00    Years: 53.00    Pack years: 53.00  . Smokeless tobacco: Never Used  Substance and Sexual Activity  . Alcohol use: Yes    Comment: occ  . Drug use: No  . Sexual activity: Not on file  Other Topics Concern  . Not on file  Social History Narrative  . Not on file   Social Determinants of Health   Financial Resource Strain:   . Difficulty of Paying Living Expenses: Not on file  Food Insecurity:   . Worried About Charity fundraiser in the Last Year: Not on file  . Ran Out of Food in the Last Year: Not on file  Transportation Needs:   . Lack of Transportation (Medical): Not on file  . Lack of Transportation (Non-Medical): Not on file  Physical Activity:   . Days of Exercise per Week: Not on file  . Minutes of Exercise per Session: Not on file  Stress:   . Feeling of Stress : Not on file  Social Connections:   . Frequency of Communication with Friends and Family: Not on file  . Frequency of Social Gatherings with Friends and Family: Not on file  .  Attends Religious Services: Not on file  . Active Member of Clubs or Organizations: Not on file  . Attends Archivist Meetings: Not on file  . Marital Status: Not on file     Family History:  The patient's family history includes Cancer in his father; Diabetes in his brother, mother, and sister; Heart disease in his brother, father, and mother; Multiple sclerosis in his daughter; Parkinson's disease in his mother.  ROS:   Please see the history of present illness. Presently sleeps in a recliner simply to keep himself from rolling over on his side.  All other systems are reviewed and otherwise negative.    EKGs/Labs/Other Studies Reviewed:    Studies reviewed are outlined and summarized above. Reports included below if pertinent.  Cardiac Cath 12/19/19 Conclusion    Ost RCA to Prox RCA lesion is 100% stenosed.  Prox Cx lesion is 95% stenosed.  Mid Cx lesion is 95% stenosed.  Mid LAD lesion is 80% stenosed.   Steven Lawrence is a 69 y.o. male    [id number] LOCATION:  FACILITY: [facility]  PHYSICIAN: Quay Burow, M.D. 08/18/1950   DATE OF PROCEDURE:  12/19/2019  DATE OF DISCHARGE:     CARDIAC CATHETERIZATION     History obtained from chart review.69 year old male with past medical history of diabetes mellitus, hyperlipidemia, restless leg syndrome, benign prostatic hypertrophy, tobacco abuse admitted with non-ST elevation myocardial infarction.Echocardiogram shows normal LV function, mild left ventricular hypertrophy, moderate aortic stenosis with mean gradient 22 mmHg.   IMPRESSION: Mr. Gorrell has diffusely calcified vessels, three-vessel disease and preserved LV function.  He did not have a pullback gradient across the aortic valve.  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  I believe the best therapy would be complete  revascularization with CABG.  The sheath was removed and a TR band was placed on the right wrist to achieve patent hemostasis.  The patient left lab in stable condition.  Heparin will be restarted 4 hours after sheath removal.  T CTS will be notified.  Quay Burow. MD, [facility] 12/19/2019  8:14 AM    2D echo 12/17/19   1. Left ventricular ejection fraction, by estimation, is 60 to 65%. The  left ventricle has normal function. The left ventricle has no regional  wall motion abnormalities. There is mild left ventricular hypertrophy.  Left ventricular diastolic parameters  were normal.  2. Right ventricular systolic function is normal. The right ventricular  size is normal.  3. The mitral valve is normal in structure. Trivial mitral valve  regurgitation. No evidence of mitral stenosis.  4. The aortic valve has an indeterminant number of cusps. There is  moderate calcification of the aortic valve. There is moderate thickening  of the aortic valve. Aortic valve regurgitation is not visualized.  Moderate aortic valve stenosis.  5. The inferior vena cava is normal in size with greater than 50%  respiratory variability, suggesting right atrial pressure of 3 mmHg.     EKG:  EKG is ordered today, personally reviewed, demonstrating NSR 72bpm, no acute STT changes  Recent Labs: 12/16/2019: ALT 19 12/22/2019: Magnesium 2.1 12/26/2019: BUN 21; Creatinine, Ser 1.05; Hemoglobin 8.1; Platelets 140; Potassium 3.5; Sodium 137  Recent Lipid Panel    Component Value Date/Time   CHOL 124 12/20/2019 0537   TRIG 47 12/20/2019 0537   HDL 49 12/20/2019 0537   CHOLHDL 2.5 12/20/2019 0537   VLDL 9 12/20/2019 0537   LDLCALC 66 12/20/2019 0537    PHYSICAL EXAM:    VS:  BP 102/60   Pulse 72   Ht 5\' 9"  (1.753 m)   Wt 156 lb 9.6 oz (71 kg)   SpO2 96%   BMI 23.13 kg/m   BMI: Body mass index is 23.13 kg/m.  GEN: Well nourished, well developed WM, in no acute distress HEENT: normocephalic,  atraumatic Neck: no JVD, carotid bruits, or masses Cardiac: RRR; soft SEM RUSB, no rubs or gallops, trace sockline RLE edema, symmetry of lower extremities, no erythema, warmth or palpable cords Respiratory: somewhat decreased BS at left base otherwise clear to auscultation bilaterally, normal work of breathing GI: soft, nontender, nondistended, + BS MS: no deformity or atrophy Skin: warm and dry, no rash, incisions c/d/i Neuro:  Alert and Oriented x 3, Strength and sensation are intact, follows commands Psych: euthymic mood, full affect  Wt Readings from Last 3 Encounters:  01/10/20 156 lb 9.6 oz (71 kg)  12/26/19 157 lb 13.6 oz (71.6 kg)  12/27/14 152 lb 11.2 oz (69.3 kg)     ASSESSMENT & PLAN:   1. CAD s/p recent CABG - clinically doing well. Continue ASA. He will clarify if he is on metoprolol and check statin dose when he gets home. He should be on high intensity atorvastatin which he thinks he is, but is not reflected in our notes. His BP is low normal so I would not start metoprolol if he isn't already on it. Would hold off provision of NTG script given soft BP at this time. No orthostatic signs or dizziness. He technically ruled in for NSTEMI but given need for Eliquis would not add Plavix at this time (reviewed with Dr. Stanford Breed who agrees). Can be reconsidered once he is off Eliquis. He did have some post-op volume overload issues requiring resumption of Lasix at 20mg  daily per phone notes. Edema has significantly improved. He is not tachycardic, tachypneic or SOB. He has mildly decreased BS at his left base and O2 sat 92-96% so would continue Lasix until seen by TCTS, can likely discontinue at that time if CXR OK. F/u  BMET reviewed from PCP yesterday was WNL with normal potassium and creatinine. 2. Aortic stenosis s/p bioprosthetic AVR - 6 week echo already scheduled end of November. Discussed SBE ppx. 3. Post-op atrial fib - maintaining NSR today. Continues on amiodarone (down to once  daily now) and Eliquis BID at this time. Addendum: I discussed duration with Dr. Stanford Breed. If atrial fibrillation was all postoperatively would continue apixaban/amiodarone for 8 weeks and if sinus is maintained can discontinue at that point. We can re-evaluate plan at time of follow-up. Further mgmt of TSH per primary care (obtained in their office yesterday). 4. Hyperlipidemia - clarifying statin as above. If he is only on 20mg  daily would increase to 80mg  daily with f/u liver/lipids at next OV. Otherwise if he's already on 80mg  no change needed. 5. OSA - he reports this was a remote dx when he weighed somewhat more. He was intolerant of CPAP at that time and has not had any issues since. We discussed that if afib became recurrent, would advise revisiting testing and therapy options. He currently does not really fit the body habitus for this otherwise at present time.  Disposition: F/u with Dr. Melynda Ripple team APP in 4 weeks. Was originally set up in the [facility] office because Dr. Radford Pax was listed as cardiologist but this was remotely in 2014 so no longer established with her.  Medication Adjustments/Labs and Tests Ordered: Current medicines are reviewed at length with the patient today.  Concerns regarding medicines are outlined above. Medication changes, Labs and Tests ordered today are summarized above and listed in the Patient Instructions accessible in Encounters.   Signed, Charlie Pitter, PA-C  01/10/2020 1:33 PM    [facility] [address], [place], [place]  [address] Phone: [phone]; Fax: [phone]

## 2020-01-09 DIAGNOSIS — I2510 Atherosclerotic heart disease of native coronary artery without angina pectoris: Secondary | ICD-10-CM | POA: Diagnosis not present

## 2020-01-09 DIAGNOSIS — E1169 Type 2 diabetes mellitus with other specified complication: Secondary | ICD-10-CM | POA: Diagnosis not present

## 2020-01-09 DIAGNOSIS — D649 Anemia, unspecified: Secondary | ICD-10-CM | POA: Diagnosis not present

## 2020-01-09 DIAGNOSIS — Z7984 Long term (current) use of oral hypoglycemic drugs: Secondary | ICD-10-CM | POA: Diagnosis not present

## 2020-01-09 DIAGNOSIS — E113293 Type 2 diabetes mellitus with mild nonproliferative diabetic retinopathy without macular edema, bilateral: Secondary | ICD-10-CM | POA: Diagnosis not present

## 2020-01-09 DIAGNOSIS — H3500 Unspecified background retinopathy: Secondary | ICD-10-CM | POA: Diagnosis not present

## 2020-01-09 DIAGNOSIS — E039 Hypothyroidism, unspecified: Secondary | ICD-10-CM | POA: Diagnosis not present

## 2020-01-09 DIAGNOSIS — G2581 Restless legs syndrome: Secondary | ICD-10-CM | POA: Diagnosis not present

## 2020-01-09 DIAGNOSIS — N400 Enlarged prostate without lower urinary tract symptoms: Secondary | ICD-10-CM | POA: Diagnosis not present

## 2020-01-09 DIAGNOSIS — E7800 Pure hypercholesterolemia, unspecified: Secondary | ICD-10-CM | POA: Diagnosis not present

## 2020-01-09 DIAGNOSIS — Z951 Presence of aortocoronary bypass graft: Secondary | ICD-10-CM | POA: Diagnosis not present

## 2020-01-09 DIAGNOSIS — I252 Old myocardial infarction: Secondary | ICD-10-CM | POA: Diagnosis not present

## 2020-01-09 DIAGNOSIS — Z952 Presence of prosthetic heart valve: Secondary | ICD-10-CM | POA: Diagnosis not present

## 2020-01-10 ENCOUNTER — Encounter: Payer: Self-pay | Admitting: Physician Assistant

## 2020-01-10 ENCOUNTER — Other Ambulatory Visit: Payer: Self-pay

## 2020-01-10 ENCOUNTER — Ambulatory Visit (INDEPENDENT_AMBULATORY_CARE_PROVIDER_SITE_OTHER): Payer: Medicare Other | Admitting: Physician Assistant

## 2020-01-10 VITALS — BP 102/60 | HR 72 | Ht 69.0 in | Wt 156.6 lb

## 2020-01-10 DIAGNOSIS — I200 Unstable angina: Secondary | ICD-10-CM

## 2020-01-10 DIAGNOSIS — I9789 Other postprocedural complications and disorders of the circulatory system, not elsewhere classified: Secondary | ICD-10-CM

## 2020-01-10 DIAGNOSIS — I2510 Atherosclerotic heart disease of native coronary artery without angina pectoris: Secondary | ICD-10-CM

## 2020-01-10 DIAGNOSIS — I4891 Unspecified atrial fibrillation: Secondary | ICD-10-CM

## 2020-01-10 DIAGNOSIS — Z09 Encounter for follow-up examination after completed treatment for conditions other than malignant neoplasm: Secondary | ICD-10-CM

## 2020-01-10 DIAGNOSIS — I350 Nonrheumatic aortic (valve) stenosis: Secondary | ICD-10-CM | POA: Diagnosis not present

## 2020-01-10 DIAGNOSIS — G4733 Obstructive sleep apnea (adult) (pediatric): Secondary | ICD-10-CM

## 2020-01-10 DIAGNOSIS — Z951 Presence of aortocoronary bypass graft: Secondary | ICD-10-CM | POA: Diagnosis not present

## 2020-01-10 DIAGNOSIS — E785 Hyperlipidemia, unspecified: Secondary | ICD-10-CM

## 2020-01-10 DIAGNOSIS — Z953 Presence of xenogenic heart valve: Secondary | ICD-10-CM | POA: Diagnosis not present

## 2020-01-10 NOTE — Patient Instructions (Addendum)
Medication Instructions:  Your physician recommends that you continue on your current medications as directed. Please refer to the Current Medication list given to you today.  *If you need a refill on your cardiac medications before your next appointment, please call your pharmacy*   Lab Work: None ordered   If you have labs (blood work) drawn today and your tests are completely normal, you will receive your results only by: Marland Kitchen MyChart Message (if you have MyChart) OR . A paper copy in the mail If you have any lab test that is abnormal or we need to change your treatment, we will call you to review the results.   Testing/Procedures: None ordered   Follow-Up: At [facility], you and your health needs are our priority.  As part of our continuing mission to provide you with exceptional heart care, we have created designated Provider Care Teams.  These Care Teams include your primary Cardiologist (physician) and Advanced Practice Providers (APPs -  Physician Assistants and Nurse Practitioners) who all work together to provide you with the care you need, when you need it.  We recommend signing up for the patient portal called "MyChart".  Sign up information is provided on this After Visit Summary.  MyChart is used to connect with patients for Virtual Visits (Telemedicine).  Patients are able to view lab/test results, encounter notes, upcoming appointments, etc.  Non-urgent messages can be sent to your provider as well.   To learn more about what you can do with MyChart, go to [email or web address].    Your next appointment:   4 week(s)  The format for your next appointment:   In Person  Provider:   You may see [name], MD or one of the following Advanced Practice Providers on your designated Care Team:    Melina Copa, PA-C  Ermalinda Barrios, PA-C    Other Instructions You can call Endocarditis Information  You may be at risk for developing endocarditis since you have an  artificial heart valve or a repaired heart valve. Endocarditis is an infection of the lining of the heart or heart valves. Certain surgical and dental procedures may put you at risk, such as teeth cleaning or other dental procedures or other medical procedures. Notify your doctor or dentist before having any invasive or surgical procedures. You will need to take antibiotics before certain procedures. To prevent endocarditis, maintain good oral health. Seek prompt medical attention for any mouth/gum, skin or urinary tract infections.

## 2020-01-11 ENCOUNTER — Telehealth: Payer: Self-pay | Admitting: Cardiology

## 2020-01-11 ENCOUNTER — Telehealth: Payer: Self-pay | Admitting: Physician Assistant

## 2020-01-11 NOTE — Telephone Encounter (Signed)
Hilda Blades - I saw your note while addressing another phone note from today. I saw this patient yesterday in clinic and he was doing well - had had labs by PCP just the day before that looked great. I dictated them into the note. No need for BMET since this satisfied the 1 week BMET request. I spoke with the patient to let him know.

## 2020-01-11 NOTE — Telephone Encounter (Signed)
Patient's wife states that they got a letter in the mail stating that they need to have lab work done prior to his appt with Dr. Stanford Breed on 11/30. She states he had lab work done at Dr. [employer] office and wants to know if these labs are really necessary again. Please advise.

## 2020-01-11 NOTE — Telephone Encounter (Signed)
Spoke with patient to clarify. He had picked up metoprolol rx after dc and reports he was on it about 2 weeks then stopped the day or so prior to our OV for unclear reasons, possibly the pill quantity. Therefore at recent [place] he was not on this medicine. Given low-normal BP at recent OV, as previously outlined, will hold off on restarting. This can be reconsidered in subsequent visits contingent on BP. Plan relayed to pt who is in agreement.

## 2020-01-11 NOTE — Telephone Encounter (Signed)
Patients wife is calling stating that the AVS from OV earlier this week with DD, PA did not have Metoprolol listed on the patients medication list.   Excerpt from DD, PA OV note: There is some confusion with his medicine list. He does not believe he is on metoprolol as he does not recall this being on his hospital list but his AVS, DC summary, and rx were sent in with this on it.   Patients wife confirms that the patient was not on this medication before being discharged from the hospital. He took it for approximately 2 weeks but has been off of it for two days because it was not on his recent OV AVS note. Patient has not had any symptoms and does not check his BP or HR regularly at home. However, they state that BP was low at recent OV (102/60).  Advised the patient and his wife that the patient could remain off of the Metoprolol if he continues to tolerate not being on it. Advised patient to monitor his HR and BP daily and keep a record for [place] to refer back to. They verbalized understanding and will call back if HR or BP begins to rise or if symptoms present.

## 2020-01-11 NOTE — Telephone Encounter (Signed)
Left message for patient wife, the lab work is needed because the patient was given lasix 30 mg daily for swelling and we need to check kidney function and potassium.

## 2020-01-11 NOTE — Telephone Encounter (Signed)
Pt c/o medication issue:  1. Name of Medication: Metoprolol Tartrate 25mg   2. How are you currently taking this medication (dosage and times per day)? Did not take today  3. Are you having a reaction (difficulty breathing--STAT)? no  4. What is your medication issue? Wife wants to know if patient should be taking this medication. She states she did not see it on his AVS from yesterday. She said he does not recall stating he was not taking medication. Please advise if he should stay on or stay off medication.

## 2020-01-16 ENCOUNTER — Other Ambulatory Visit: Payer: Self-pay | Admitting: Thoracic Surgery (Cardiothoracic Vascular Surgery)

## 2020-01-16 DIAGNOSIS — Z951 Presence of aortocoronary bypass graft: Secondary | ICD-10-CM

## 2020-01-17 ENCOUNTER — Other Ambulatory Visit: Payer: Self-pay

## 2020-01-17 ENCOUNTER — Encounter: Payer: Self-pay | Admitting: Thoracic Surgery (Cardiothoracic Vascular Surgery)

## 2020-01-17 ENCOUNTER — Ambulatory Visit
Admission: RE | Admit: 2020-01-17 | Discharge: 2020-01-17 | Disposition: A | Discharge status: Home / Self Care | Payer: Medicare Other | Source: Ambulatory Visit | Attending: Thoracic Surgery (Cardiothoracic Vascular Surgery) | Admitting: Thoracic Surgery (Cardiothoracic Vascular Surgery)

## 2020-01-17 ENCOUNTER — Ambulatory Visit (INDEPENDENT_AMBULATORY_CARE_PROVIDER_SITE_OTHER): Payer: Self-pay | Admitting: Thoracic Surgery (Cardiothoracic Vascular Surgery)

## 2020-01-17 ENCOUNTER — Other Ambulatory Visit: Payer: Self-pay | Admitting: Physician Assistant

## 2020-01-17 VITALS — BP 130/75 | HR 87 | Resp 20 | Ht 69.0 in | Wt 152.4 lb

## 2020-01-17 DIAGNOSIS — Z951 Presence of aortocoronary bypass graft: Secondary | ICD-10-CM | POA: Diagnosis not present

## 2020-01-17 DIAGNOSIS — Z952 Presence of prosthetic heart valve: Secondary | ICD-10-CM | POA: Diagnosis not present

## 2020-01-17 DIAGNOSIS — J90 Pleural effusion, not elsewhere classified: Secondary | ICD-10-CM | POA: Diagnosis not present

## 2020-01-17 DIAGNOSIS — J9811 Atelectasis: Secondary | ICD-10-CM | POA: Diagnosis not present

## 2020-01-17 MED ORDER — PREDNISONE 10 MG (21) PO TBPK: ORAL_TABLET | ORAL | 0 refills | Status: AC

## 2020-01-17 NOTE — Patient Instructions (Signed)
Pain tinea

## 2020-01-17 NOTE — Progress Notes (Signed)
[address]       [place] [address]             [phone]      HPI: Steven Lawrence returns for a scheduled follow-up visit  Steven Lawrence is a 69 year old man with a history of tobacco abuse, type 2 diabetes, hypertension, hyperlipidemia, hypothyroidism, heart murmur, sleep apnea, arthritis, restless leg syndrome, reflux, and depression.  He presented with chest pain and ruled in for non-ST elevation MI.  Cardiac catheterization he was found to have severe three-vessel coronary disease.  He also had moderately severe aortic stenosis with a valve area of 1.1 cm.  His mean gradient was 22 and peak gradient 37.  He underwent coronary artery bypass grafting x5 and aortic valve replacement with an Inspira Resilia bovine pericardial valve on 12/21/2019.  Postoperatively he had atrial fibrillation.  He converted to sinus rhythm with amiodarone.  He went home on amiodarone and Eliquis.  Since discharge he has been doing well.  He was gaining some weight and had some swelling so Dr. Stanford Breed started him on 20 mg of Lasix daily.  He is no longer taking any narcotics for pain.  He has had persistent nausea.  He denies any recurrent chest pain.  He also denies shortness of breath.  Current Outpatient Medications  Medication Sig Dispense Refill  . apixaban (ELIQUIS) 5 MG TABS tablet Take 1 tablet (5 mg total) by mouth 2 (two) times daily. 60 tablet 2  . aspirin EC 81 MG EC tablet Take 1 tablet (81 mg total) by mouth daily. Swallow whole. 30 tablet 11  . atorvastatin (LIPITOR) 20 MG tablet Take 80 mg by mouth daily.     . canagliflozin (INVOKANA) 100 MG TABS tablet Take 100 mg by mouth daily.    . cholestyramine light (PREVALITE) 4 GM/DOSE powder Take 1 g by mouth daily as needed (For stomach).    . doxazosin (CARDURA) 2 MG tablet Take 2 mg by mouth every morning. Takes for urinary freq    . furosemide (LASIX) 20 MG tablet Take 1 tablet (20 mg total) by mouth daily. 90 tablet 3  .  LEVEMIR FLEXTOUCH 100 UNIT/ML FlexPen Inject 20 Units into the skin at bedtime.    Marland Kitchen levothyroxine (SYNTHROID, LEVOTHROID) 50 MCG tablet Take 75 mcg by mouth daily before breakfast.     . metFORMIN (GLUCOPHAGE) 1000 MG tablet Take 1,000 mg by mouth 2 (two) times daily with a meal.    . ondansetron (ZOFRAN) 8 MG tablet Take 1 tablet (8 mg total) by mouth every 8 (eight) hours as needed for nausea or vomiting. 20 tablet 0  . pramipexole (MIRAPEX) 0.125 MG tablet Take 0.125 mg by mouth as directed. RLS at Bedtime    . predniSONE (STERAPRED UNI-PAK 21 TAB) 10 MG (21) TBPK tablet Take 5 tablets (50 mg total) by mouth daily for 1 day, THEN 4 tablets (40 mg total) daily for 1 day, THEN 3 tablets (30 mg total) daily for 1 day, THEN 2 tablets (20 mg total) daily for 1 day, THEN 1 tablet (10 mg total) daily for 1 day. 15 tablet 0   No current facility-administered medications for this visit.    Physical Exam BP 130/75 (BP Location: Right Arm, Patient Position: Sitting)   Pulse 87   Resp 20   Ht 5\' 9"  (1.753 m)   Wt 152 lb 6.4 oz (69.1 kg)   SpO2 97% Comment: RA with mask on  BMI 22.65 kg/m  69 year old man in no acute distress Alert and oriented x3 with no focal deficits Lungs diminished breath sounds at left base, otherwise clear Cardiac regular rate and rhythm with faint systolic murmur Sternum stable, incision well-healed Leg incisions healing well, trace edema  Diagnostic Tests: CHEST - 2 VIEW  COMPARISON:  12/25/2019  FINDINGS: Previous median sternotomy, CABG and aortic valve replacement. Small pleural effusion on the right mild right base atelectasis, similar to the previous study. Enlargement of the left effusion which is moderate in size and associated with partial collapse of the left lower lobe. No interstitial or alveolar edema seen in the aerated lungs.  IMPRESSION: 1. Enlarging left effusion, moderate in size, with partial collapse of the left lower lobe. 2. Small  right effusion and mild right base atelectasis, similar to the previous study.   Electronically Signed   By: Nelson Chimes M.D.   On: 01/17/2020 09:18 I personally reviewed the chest x-ray images and concur with the findings noted above  Impression: Steven Lawrence is a 69 year old gentleman with multiple cardiac risk factors who presented with a non-ST elevation MI.  He was found to have severe three-vessel disease and moderately severe aortic stenosis.  He underwent coronary bypass grafting x5 and aortic valve replacement with a bovine pericardial valve on 12/21/2019.  Doing well from a pain standpoint.  Not requiring any narcotics.  Atrial fibrillation-is regular rhythm today.  He is about a month out.  He is on amiodarone and Eliquis.  Stop amiodarone due to nausea.  Continue Eliquis for now.  Nausea-has been present since discharge.  Predated Lasix.  Did not resolve with stopping narcotics.  By far the most likely cause is amiodarone.  We will go ahead and stop that medication today.  Left pleural effusion-has a moderate effusion.  Since he is on Eliquis, will defer on thoracentesis for now.  We will try a prednisone taper and continue his low-dose Lasix.  We will repeat another chest x-ray in 2 weeks.  If still present we can do a thoracentesis then.  He may begin driving.  Appropriate precautions were discussed.  He is not to lift anything over 10 pounds for another 2 weeks, and nothing over 20 pounds for another 4 weeks.  Plan: Prednisone taper Stop amiodarone Return in 2 weeks with PA lateral chest x-ray  Melrose Nakayama, MD [facility] [phone]

## 2020-01-19 ENCOUNTER — Other Ambulatory Visit: Payer: Self-pay | Admitting: Physician Assistant

## 2020-01-30 ENCOUNTER — Other Ambulatory Visit: Payer: Self-pay | Admitting: Thoracic Surgery (Cardiothoracic Vascular Surgery)

## 2020-01-30 DIAGNOSIS — Z951 Presence of aortocoronary bypass graft: Secondary | ICD-10-CM

## 2020-01-31 ENCOUNTER — Other Ambulatory Visit: Payer: Self-pay

## 2020-01-31 ENCOUNTER — Ambulatory Visit
Admission: RE | Admit: 2020-01-31 | Discharge: 2020-01-31 | Disposition: A | Discharge status: Home / Self Care | Payer: Medicare Other | Source: Ambulatory Visit | Attending: Thoracic Surgery (Cardiothoracic Vascular Surgery) | Admitting: Thoracic Surgery (Cardiothoracic Vascular Surgery)

## 2020-01-31 ENCOUNTER — Ambulatory Visit (INDEPENDENT_AMBULATORY_CARE_PROVIDER_SITE_OTHER): Payer: Self-pay | Admitting: Thoracic Surgery (Cardiothoracic Vascular Surgery)

## 2020-01-31 ENCOUNTER — Encounter: Payer: Self-pay | Admitting: Thoracic Surgery (Cardiothoracic Vascular Surgery)

## 2020-01-31 ENCOUNTER — Other Ambulatory Visit: Payer: Self-pay | Admitting: Physician Assistant

## 2020-01-31 VITALS — BP 132/73 | HR 63 | Resp 18 | Ht 69.0 in | Wt 148.0 lb

## 2020-01-31 DIAGNOSIS — Z951 Presence of aortocoronary bypass graft: Secondary | ICD-10-CM

## 2020-01-31 DIAGNOSIS — R918 Other nonspecific abnormal finding of lung field: Secondary | ICD-10-CM | POA: Diagnosis not present

## 2020-01-31 NOTE — Progress Notes (Signed)
[address]       [place] [address]             [phone]    HPI: Mr. Bembenek returns for a scheduled follow-up visit  Austen Oyster is a 69 year old man with a history of tobacco abuse, type 2 diabetes, hypertension, hyperlipidemia, hypothyroidism, heart murmur, sleep apnea, arthritis, restless leg syndrome, reflux, and depression.  He presented with chest pain and ruled in for non-ST elevation MI.  He was found to have a three-vessel disease and moderately severe aortic stenosis.  We did coronary bypass grafting x5 and aortic valve replacement on 12/21/2019.  Postoperatively he had atrial fibrillation and went home on amiodarone and Eliquis.  I saw him back in the office on 01/17/2020.  He had a moderate pleural effusion.  He had just been started on 20 mg of Lasix daily.  I gave him a prednisone taper.  He also was having a lot of nausea so we stopped his amiodarone.  His nausea has improved but has not completely resolved.  He is not having any shortness of breath orthopnea.  He is anxious to increase his physical activities.  Past Medical History:  Diagnosis Date  . Acid reflux   . Arthritis   . BPH (benign prostatic hyperplasia)    mild  . CAD in native artery    a. NSTEMI 12/2019 s/p CABGx5 with bovine AVR.  Marland Kitchen Depression   . 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 History of pneumonia as a child   . Hypercholesteremia   . Hypothyroidism   . RLS (restless legs syndrome)   . S/P aortic valve replacement with bioprosthetic valve   . S/P CABG (coronary artery bypass graft)   . Sleep apnea    does not use a cpap,primarily with upper airway resistance syndrome AHI 2.35/hr, RDI 10.2/hr ([name])  . Urinary frequency   . Wears glasses   . Wears hearing aid    both ears    Current Outpatient Medications  Medication Sig Dispense Refill  . apixaban (ELIQUIS) 5 MG TABS tablet Take 1 tablet (5 mg total) by mouth 2 (two) times daily.  60 tablet 2  . aspirin EC 81 MG EC tablet Take 1 tablet (81 mg total) by mouth daily. Swallow whole. 30 tablet 11  . atorvastatin (LIPITOR) 20 MG tablet Take 80 mg by mouth daily.     . canagliflozin (INVOKANA) 100 MG TABS tablet Take 100 mg by mouth daily.    . cholestyramine light (PREVALITE) 4 GM/DOSE powder Take 1 g by mouth daily as needed (For stomach).    . doxazosin (CARDURA) 2 MG tablet Take 2 mg by mouth every morning. Takes for urinary freq    . furosemide (LASIX) 20 MG tablet Take 1 tablet (20 mg total) by mouth daily. 90 tablet 3  . LEVEMIR FLEXTOUCH 100 UNIT/ML FlexPen Inject 20 Units into the skin at bedtime.    Marland Kitchen levothyroxine (SYNTHROID, LEVOTHROID) 50 MCG tablet Take 75 mcg by mouth daily before breakfast.     . metFORMIN (GLUCOPHAGE) 1000 MG tablet Take 1,000 mg by mouth 2 (two) times daily with a meal.    . ondansetron (ZOFRAN) 8 MG tablet Take 1 tablet (8 mg total) by mouth every 8 (eight) hours as needed for nausea or vomiting. 20 tablet 0  . pramipexole (MIRAPEX) 0.125 MG tablet Take 0.125 mg by mouth as directed. x4 RLS at Bedtime  No current facility-administered medications for this visit.    Physical Exam BP 132/73 (BP Location: Right Arm, Patient Position: Sitting)   Pulse 63   Resp 18   Ht 5\' 9"  (1.753 m)   Wt 148 lb (67.1 kg)   SpO2 97% Comment: RA with mask on  BMI 21.88 kg/m  69 year old man in no acute distress Alert and oriented x3 with no focal deficits Lungs clear with equal breath sounds bilaterally Sternum stable, incision well-healed Cardiac regular rate and rhythm  Diagnostic Tests: CHEST - 2 VIEW  COMPARISON:  01/17/2020  FINDINGS: Cardiomediastinal silhouette unchanged in size and contour. Surgical changes of median sternotomy, CABG, aortic valve repair.  No pneumothorax.  Decreasing opacity at the left lung base with persistent left costophrenic angle blunting and meniscus. Improving aeration at the right lung base with  improved visualization of the right hemidiaphragm and resolution of prior pleural fluid.  No confluent airspace disease.  No acute displaced fracture.  IMPRESSION: Interval resolution of right pleural fluid and improving left pleural effusion.  Surgical changes of median sternotomy, CABG, aortic valve repair   Electronically Signed   By: Corrie Mckusick D.O.   On: 01/31/2020 12:42 I personally reviewed the chest x-ray images and concur with the findings noted above  Impression: Marlo Goodrich is a 69 year old gentleman with multiple cardiac risk factors but no prior history who presented with a non-ST elevation MI.  He had three-vessel disease and moderately severe aortic stenosis.  He underwent coronary bypass grafting x5 and aortic valve replacement with a tissue valve on 12/21/2019.  Postoperatively he had some atrial fibrillation but his course was otherwise uncomplicated.  Status post CABG/AVR-no recurrent angina.  Walking about an hour a day.  No longer any restrictions on his activities, but he was cautioned to build into new activities gradually.  Left pleural effusion-almost completely resolved.  I will check another chest x-ray on him in about a month just to be sure it does not recur.  Postoperative nausea-probably primarily due to amiodarone.  Stopping that has helped but did not completely resolve his nausea.  That should continue to improve with time.  Plan: Return in 1 month with PA and lateral chest x-ray  Melrose Nakayama, MD [facility] [phone]

## 2020-02-01 ENCOUNTER — Telehealth: Payer: Self-pay

## 2020-02-01 NOTE — Telephone Encounter (Signed)
-----   Message from Melrose Nakayama, MD sent at 02/01/2020  1:17 PM EST ----- Regarding: RE: Ondansetron refill yes ----- Message ----- From: Donnella Sham, RN Sent: 02/01/2020  12:27 PM EST To: Melrose Nakayama, MD Subject: Ondansetron refill                             Hey,  He is requesting a refill of Ondansetron.  Is this appropriate?    Thanks,  Caryl Pina

## 2020-02-02 NOTE — Progress Notes (Signed)
HPI: Follow-up coronary artery disease.  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  Patient hospitalized with non-ST elevation myocardial infarction October 2021.  Echocardiogram showed normal LV function, mild left ventricular hypertrophy, moderate aortic stenosis.  Cardiac catheterization showed an occluded RCA, 95% circumflex and 80% LAD.  Carotid Doppler showed 1 to 39% bilateral stenosis.  Patient subsequently had coronary artery bypass and graft with LIMA to the LAD, saphenous vein graft to the first diagonal, sequential saphenous vein graft to OM 2/OM 3 and saphenous vein graft to the PDA; aortic valve replacement with bovine pericardial valve.  Course complicated by atrial fibrillation treated with amiodarone and apixaban.  Follow-up echocardiogram February 08, 2020 showed normal LV function, moderate left ventricular hypertrophy, grade 2 diastolic dysfunction, previous aortic valve replacement with mean gradient 13 mmHg and no aortic insufficiency.  Since last seen there is no dyspnea, chest pain, palpitations or syncope.  No pedal edema.  Occasional dizziness with standing.  Current Outpatient Medications  Medication Sig Dispense Refill  . apixaban (ELIQUIS) 5 MG TABS tablet Take 1 tablet (5 mg total) by mouth 2 (two) times daily. 60 tablet 2  . aspirin EC 81 MG EC tablet Take 1 tablet (81 mg total) by mouth daily. Swallow whole. 30 tablet 11  . atorvastatin (LIPITOR) 80 MG tablet Take 80 mg by mouth daily.     . canagliflozin (INVOKANA) 100 MG TABS tablet Take 100 mg by mouth daily.    Marland Kitchen doxazosin (CARDURA) 2 MG tablet Take 2 mg by mouth every morning. Takes for urinary freq    . furosemide (LASIX) 20 MG tablet Take 1 tablet (20 mg total) by mouth daily. 90 tablet 3  . LEVEMIR FLEXTOUCH 100 UNIT/ML FlexPen Inject 20 Units into the skin at bedtime.    Marland Kitchen levothyroxine (SYNTHROID, LEVOTHROID) 50 MCG tablet Take 75 mcg by mouth daily before  breakfast.     . metFORMIN (GLUCOPHAGE) 1000 MG tablet Take 1,000 mg by mouth 2 (two) times daily with a meal.    . ondansetron (ZOFRAN) 8 MG tablet TAKE 1 TABLET BY MOUTH EVERY 8 HOURS AS NEEDED FOR NAUSEA OR VOMITING. 20 tablet 0  . pramipexole (MIRAPEX) 0.125 MG tablet Take 0.125 mg by mouth as directed. x4 RLS at Bedtime    . cholestyramine light (PREVALITE) 4 GM/DOSE powder Take 1 g by mouth daily as needed (For stomach). (Patient not taking: Reported on 02/14/2020)     No current facility-administered medications for this visit.     Past Medical History:  Diagnosis Date  . Acid reflux   . Arthritis   . BPH (benign prostatic hyperplasia)    mild  . CAD in native artery    a. NSTEMI 12/2019 s/p CABGx5 with bovine AVR.  Marland Kitchen Depression   . 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 History of pneumonia as a child   . Hypercholesteremia   . Hypothyroidism   . RLS (restless legs syndrome)   . S/P aortic valve replacement with bioprosthetic valve   . S/P CABG (coronary artery bypass graft)   . Sleep apnea    does not use a cpap,primarily with upper airway resistance syndrome AHI 2.35/hr, RDI 10.2/hr ([name])  . Urinary frequency   . Wears glasses   . Wears hearing aid    both ears    Past Surgical History:  Procedure Laterality Date  . ANTERIOR LAT LUMBAR FUSION Left 01/04/2015  Procedure: ANTERIOR LATERAL LUMBAR FUSION 1 LEVEL;  Surgeon: Phylliss Bob, MD;  Location: [facility];  Service: Orthopedics;  Laterality: Left;  Left sided lateral interbody fusion lumbar 3-4 with instrumentation and allograft  . AORTIC VALVE REPLACEMENT N/A 12/21/2019   Procedure: AORTIC VALVE REPLACEMENT WITH INSPIRIS RESILIA AORTIC VALVE SIZE 23MM;  Surgeon: Melrose Nakayama, MD;  Location: [facility];  Service: Open Heart Surgery;  Laterality: N/A;  . COLONOSCOPY    . CORONARY ARTERY BYPASS GRAFT N/A 12/21/2019   Procedure: CORONARY ARTERY BYPASS GRAFTING TIMES FIVE USING LEFT  INTERNAL [employer] ARTERY AND ENDOSCOPICALLY HARVESTED RIGHT GREATER SAPHENOUS VEIN.;  Surgeon: Melrose Nakayama, MD;  Location: [facility];  Service: Open Heart Surgery;  Laterality: N/A;  . ENDOVEIN HARVEST OF GREATER SAPHENOUS VEIN  12/21/2019   Procedure: ENDOVEIN HARVEST OF RIGHT GREATER SAPHENOUS VEIN;  Surgeon: Melrose Nakayama, MD;  Location: [facility];  Service: Open Heart Surgery;;  . ESOPHAGOGASTRODUODENOSCOPY    . INGUINAL HERNIA REPAIR Right 08/12/2012   Procedure: HERNIA REPAIR INGUINAL ADULT;  Surgeon: Imogene Burn. Georgette Dover, MD;  Location: [facility];  Service: General;  Laterality: Right;  . INSERTION OF MESH Right 08/12/2012   Procedure: INSERTION OF MESH;  Surgeon: Imogene Burn. Georgette Dover, MD;  Location: [facility];  Service: General;  Laterality: Right;  . LEFT HEART CATH AND CORONARY ANGIOGRAPHY N/A 12/19/2019   Procedure: LEFT HEART CATH AND CORONARY ANGIOGRAPHY;  Surgeon: Lorretta Harp, MD;  Location: [facility];  Service: Cardiovascular;  Laterality: N/A;  . TEE WITHOUT CARDIOVERSION N/A 12/21/2019   Procedure: TRANSESOPHAGEAL ECHOCARDIOGRAM (TEE);  Surgeon: Melrose Nakayama, MD;  Location: [facility];  Service: Open Heart Surgery;  Laterality: N/A;  . TONSILLECTOMY    . VASECTOMY  1985    Social History   Socioeconomic History  . Marital status: Married    Spouse name: Not on file  . Number of children: Not on file  . Years of education: Not on file  . Highest education level: Not on file  Occupational History  . Not on file  Tobacco Use  . Smoking status: Former Smoker    Packs/day: 1.00    Years: 53.00    Pack years: 53.00    Quit date: 12/16/2019    Years since quitting: 0.1  . Smokeless tobacco: Never Used  Substance and Sexual Activity  . Alcohol use: Yes    Comment: occ  . Drug use: No  . Sexual activity: Not on file  Other Topics Concern  . Not on file  Social History Narrative  . Not on file   Social Determinants of  Health   Financial Resource Strain:   . Difficulty of Paying Living Expenses: Not on file  Food Insecurity:   . Worried About Charity fundraiser in the Last Year: Not on file  . Ran Out of Food in the Last Year: Not on file  Transportation Needs:   . Lack of Transportation (Medical): Not on file  . Lack of Transportation (Non-Medical): Not on file  Physical Activity:   . Days of Exercise per Week: Not on file  . Minutes of Exercise per Session: Not on file  Stress:   . Feeling of Stress : Not on file  Social Connections:   . Frequency of Communication with Friends and Family: Not on file  . Frequency of Social Gatherings with Friends and Family: Not on file  . Attends Religious Services: Not on file  .  Active Member of Clubs or Organizations: Not on file  . Attends Archivist Meetings: Not on file  . Marital Status: Not on file  Intimate Partner Violence:   . Fear of Current or Ex-Partner: Not on file  . Emotionally Abused: Not on file  . Physically Abused: Not on file  . Sexually Abused: Not on file    Family History  Problem Relation Age of Onset  . Diabetes Mother   . Heart disease Mother   . Parkinson's disease Mother   . Heart disease Father   . Cancer Father        melonma/Lung  . Diabetes Sister   . Diabetes Brother   . Heart disease Brother   . Multiple sclerosis Daughter     ROS: no fevers or chills, productive cough, hemoptysis, dysphasia, odynophagia, melena, hematochezia, dysuria, hematuria, rash, seizure activity, orthopnea, PND, pedal edema, claudication. Remaining systems are negative.  Physical Exam: Well-developed well-nourished in no acute distress.  Skin is warm and dry.  HEENT is normal.  Neck is supple.  Chest is clear to auscultation with normal expansion.  Cardiovascular exam is regular rate and rhythm.  1/6 systolic murmur left sternal border.  No diastolic murmur. Abdominal exam nontender or distended. No masses  palpated. Extremities show no edema. neuro grossly intact  ECG-sinus rhythm at a rate of 84, left atrial enlargement, nonspecific ST changes.  Personally reviewed  A/P  1 coronary artery disease status post coronary artery bypass graft-continue aspirin and statin.  2 status post aortic valve replacement-Continue SBE prophylaxis.  3 postoperative atrial fibrillation-patient remains in sinus rhythm on examination.  We will plan to discontinue apixaban.  Amiodarone discontinued previously.  4 hyperlipidemia-continue statin.  Check lipids and liver in 8 weeks.  5 tobacco abuse-patient has discontinued.  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  [name], MD

## 2020-02-07 ENCOUNTER — Telehealth (HOSPITAL_COMMUNITY): Payer: Self-pay

## 2020-02-07 NOTE — Telephone Encounter (Signed)
Called patient to see if he was interested in participating in the Cardiac Rehab Program. Patient stated yes. Patient will come in for orientation on 03/13/20 @ 9AM and will attend the 9AM exercise class.  Tourist information centre manager.

## 2020-02-08 ENCOUNTER — Other Ambulatory Visit: Payer: Self-pay

## 2020-02-08 ENCOUNTER — Ambulatory Visit (HOSPITAL_COMMUNITY): Payer: Medicare Other | Attending: Cardiology

## 2020-02-08 DIAGNOSIS — Z952 Presence of prosthetic heart valve: Secondary | ICD-10-CM | POA: Diagnosis not present

## 2020-02-08 DIAGNOSIS — I350 Nonrheumatic aortic (valve) stenosis: Secondary | ICD-10-CM | POA: Diagnosis not present

## 2020-02-08 LAB — ECHOCARDIOGRAM COMPLETE
AR max vel: 1.53 cm2
AV Area VTI: 1.66 cm2
AV Area mean vel: 1.47 cm2
AV Mean grad: 11.5 mmHg
AV Peak grad: 23.2 mmHg
Ao pk vel: 2.41 m/s
Area-P 1/2: 3.5 cm2
S' Lateral: 2.3 cm

## 2020-02-13 ENCOUNTER — Encounter: Payer: Self-pay | Admitting: *Deleted

## 2020-02-14 ENCOUNTER — Ambulatory Visit (INDEPENDENT_AMBULATORY_CARE_PROVIDER_SITE_OTHER): Payer: Medicare Other | Admitting: Cardiology

## 2020-02-14 ENCOUNTER — Encounter: Payer: Self-pay | Admitting: Cardiology

## 2020-02-14 ENCOUNTER — Other Ambulatory Visit: Payer: Self-pay

## 2020-02-14 VITALS — BP 130/70 | HR 84 | Ht 69.0 in | Wt 153.0 lb

## 2020-02-14 DIAGNOSIS — E785 Hyperlipidemia, unspecified: Secondary | ICD-10-CM

## 2020-02-14 DIAGNOSIS — I200 Unstable angina: Secondary | ICD-10-CM | POA: Diagnosis not present

## 2020-02-14 DIAGNOSIS — Z87891 Personal history of nicotine dependence: Secondary | ICD-10-CM

## 2020-02-14 DIAGNOSIS — R0989 Other specified symptoms and signs involving the circulatory and respiratory systems: Secondary | ICD-10-CM | POA: Diagnosis not present

## 2020-02-14 DIAGNOSIS — Z951 Presence of aortocoronary bypass graft: Secondary | ICD-10-CM

## 2020-02-14 NOTE — Patient Instructions (Signed)
Medication Instructions:   STOP ELIQUIS  *If you need a refill on your cardiac medications before your next appointment, please call your pharmacy*   Lab Work:  Your physician recommends that you return for lab work in [place]  If you have labs (blood work) drawn today and your tests are completely normal, you will receive your results only by: Marland Kitchen MyChart Message (if you have MyChart) OR . A paper copy in the mail If you have any lab test that is abnormal or we need to change your treatment, we will call you to review the results.   Testing/Procedures:  Your physician has requested that you have an abdominal aorta duplex. During this test, an ultrasound is used to evaluate the aorta. Allow 30 minutes for this exam. Do not eat after midnight the day before and avoid carbonated beverages [facility]   Follow-Up: At [facility], you and your health needs are our priority.  As part of our continuing mission to provide you with exceptional heart care, we have created designated Provider Care Teams.  These Care Teams include your primary Cardiologist (physician) and Advanced Practice Providers (APPs -  Physician Assistants and Nurse Practitioners) who all work together to provide you with the care you need, when you need it.  We recommend signing up for the patient portal called "MyChart".  Sign up information is provided on this After Visit Summary.  MyChart is used to connect with patients for Virtual Visits (Telemedicine).  Patients are able to view lab/test results, encounter notes, upcoming appointments, etc.  Non-urgent messages can be sent to your provider as well.   To learn more about what you can do with MyChart, go to [email or web address].    Your next appointment:   6 month(s)  The format for your next appointment:   In Person  Provider:   [name], MD

## 2020-02-21 DIAGNOSIS — Z125 Encounter for screening for malignant neoplasm of prostate: Secondary | ICD-10-CM | POA: Diagnosis not present

## 2020-02-21 DIAGNOSIS — Z1211 Encounter for screening for malignant neoplasm of colon: Secondary | ICD-10-CM | POA: Diagnosis not present

## 2020-02-21 DIAGNOSIS — E1169 Type 2 diabetes mellitus with other specified complication: Secondary | ICD-10-CM | POA: Diagnosis not present

## 2020-02-21 DIAGNOSIS — Z0000 Encounter for general adult medical examination without abnormal findings: Secondary | ICD-10-CM | POA: Diagnosis not present

## 2020-02-21 DIAGNOSIS — E039 Hypothyroidism, unspecified: Secondary | ICD-10-CM | POA: Diagnosis not present

## 2020-02-21 DIAGNOSIS — I700 Atherosclerosis of aorta: Secondary | ICD-10-CM | POA: Diagnosis not present

## 2020-02-21 DIAGNOSIS — E7800 Pure hypercholesterolemia, unspecified: Secondary | ICD-10-CM | POA: Diagnosis not present

## 2020-02-27 ENCOUNTER — Other Ambulatory Visit: Payer: Self-pay | Admitting: Thoracic Surgery (Cardiothoracic Vascular Surgery)

## 2020-02-27 DIAGNOSIS — Z951 Presence of aortocoronary bypass graft: Secondary | ICD-10-CM

## 2020-02-28 ENCOUNTER — Other Ambulatory Visit: Payer: Self-pay

## 2020-02-28 ENCOUNTER — Encounter: Payer: Self-pay | Admitting: Thoracic Surgery (Cardiothoracic Vascular Surgery)

## 2020-02-28 ENCOUNTER — Ambulatory Visit
Admission: RE | Admit: 2020-02-28 | Discharge: 2020-02-28 | Disposition: A | Discharge status: Home / Self Care | Payer: Medicare Other | Source: Ambulatory Visit | Attending: Thoracic Surgery (Cardiothoracic Vascular Surgery) | Admitting: Thoracic Surgery (Cardiothoracic Vascular Surgery)

## 2020-02-28 ENCOUNTER — Ambulatory Visit (INDEPENDENT_AMBULATORY_CARE_PROVIDER_SITE_OTHER): Payer: Self-pay | Admitting: Thoracic Surgery (Cardiothoracic Vascular Surgery)

## 2020-02-28 VITALS — BP 151/76 | HR 87 | Temp 98.1°F | Resp 18 | Wt 156.4 lb

## 2020-02-28 DIAGNOSIS — Z951 Presence of aortocoronary bypass graft: Secondary | ICD-10-CM

## 2020-02-28 DIAGNOSIS — J90 Pleural effusion, not elsewhere classified: Secondary | ICD-10-CM | POA: Diagnosis not present

## 2020-02-28 DIAGNOSIS — Z952 Presence of prosthetic heart valve: Secondary | ICD-10-CM | POA: Diagnosis not present

## 2020-02-28 NOTE — Progress Notes (Signed)
[address]       [place] [address]             [phone]     HPI: Steven Lawrence returns for a scheduled follow-up visit  Steven Lawrence is a 69 year old man with a history of tobacco abuse, type 2 diabetes, hypertension, hyperlipidemia, hypothyroidism, heart murmur, sleep apnea, arthritis, restless leg syndrome, reflux, and depression.  He presented with non-ST elevation MI.  Work-up revealed three-vessel coronary disease and moderately severe aortic stenosis.  I did an aortic valve replacement and coronary bypass grafting x5 on 12/21/2019.  Postoperatively he had atrial fibrillation, but otherwise did well.  Saw him back in the office on 01/17/2020.  He had a moderate pleural effusion.  He was treated with Lasix and a prednisone taper.  We stopped his amiodarone due to nausea.  He saw Dr. Stanford Breed recently and his apixaban was discontinued.  In the interim since his last visit he has continued to improve.  He is gradually feeling stronger and getting more energy.  He is no longer having nausea but does still feel a little "queasy."  He has some occasional discomfort around the sternum but is not requiring any medication for that.  Past Medical History:  Diagnosis Date  . Acid reflux   . Arthritis   . BPH (benign prostatic hyperplasia)    mild  . CAD in native artery    a. NSTEMI 12/2019 s/p CABGx5 with bovine AVR.  Marland Kitchen Depression   . 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 History of pneumonia as a child   . Hypercholesteremia   . Hypothyroidism   . RLS (restless legs syndrome)   . S/P aortic valve replacement with bioprosthetic valve   . S/P CABG (coronary artery bypass graft)   . Sleep apnea    does not use a cpap,primarily with upper airway resistance syndrome AHI 2.35/hr, RDI 10.2/hr ([name])  . Urinary frequency   . Wears glasses   . Wears hearing aid    both ears    Current Outpatient Medications  Medication Sig Dispense  Refill  . aspirin EC 81 MG EC tablet Take 1 tablet (81 mg total) by mouth daily. Swallow whole. 30 tablet 11  . atorvastatin (LIPITOR) 80 MG tablet Take 80 mg by mouth daily.     . canagliflozin (INVOKANA) 100 MG TABS tablet Take 100 mg by mouth daily.    . cholestyramine light (PREVALITE) 4 GM/DOSE powder Take 1 g by mouth daily as needed (For stomach).    . doxazosin (CARDURA) 2 MG tablet Take 2 mg by mouth every morning. Takes for urinary freq    . LEVEMIR FLEXTOUCH 100 UNIT/ML FlexPen Inject 20 Units into the skin at bedtime.    Marland Kitchen levothyroxine (SYNTHROID, LEVOTHROID) 50 MCG tablet Take 75 mcg by mouth daily before breakfast.     . metFORMIN (GLUCOPHAGE) 1000 MG tablet Take 1,000 mg by mouth 2 (two) times daily with a meal.    . ondansetron (ZOFRAN) 8 MG tablet TAKE 1 TABLET BY MOUTH EVERY 8 HOURS AS NEEDED FOR NAUSEA OR VOMITING. 20 tablet 0  . pramipexole (MIRAPEX) 0.125 MG tablet Take 0.125 mg by mouth as directed. x4 RLS at Bedtime     No current facility-administered medications for this visit.    Physical Exam BP (!) 151/76 (BP Location: Right Arm, Patient Position: Sitting, Cuff Size: Normal)   Pulse 87   Temp  98.1 F (36.7 C) (Skin)   Resp 18   Wt 156 lb 6.4 oz (70.9 kg)   SpO2 96% Comment: RA  BMI 23.19 kg/m  69 year old man in no acute distress Alert and oriented x3 with no focal deficits Lungs clear with equal breath sounds bilaterally Cardiac regular rate and rhythm normal S1 and S2, faint systolic murmur No peripheral edema  Diagnostic Tests: CHEST - 2 VIEW  COMPARISON:  Two-view chest x-ray 01/31/2020  FINDINGS: Heart size normal. Left pleural effusion continues to decrease. Residual airspace opacities are present at the left base, likely atelectasis or potentially scarring. The right is clear. CABG and aortic valve replacement noted.  IMPRESSION: 1. Continued decrease in left pleural effusion. 2. Residual airspace disease at the left base, likely  atelectasis or potentially scarring.   Electronically Signed   By: San Morelle M.D.   On: 02/28/2020 13:40  I personally reviewed the chest x-ray images and concur with the findings noted above  Impression: Steven Lawrence is a 69 year old man with a history of tobacco abuse, type 2 diabetes, hypertension, hyperlipidemia, hypothyroidism, heart murmur, sleep apnea, arthritis, restless leg syndrome, reflux, and depression.  He presented with non-ST elevation MI.  Work-up revealed three-vessel coronary disease and moderately severe aortic stenosis.  I did an aortic valve replacement and coronary bypass grafting x5 on 12/21/2019.  Postoperatively he had atrial fibrillation, but otherwise did well.  Postoperative atrial fibrillation-treated with amiodarone and apixaban. Both of these medications in sinus rhythm.  Left pleural effusion, postoperative-resolved  Postoperative nausea-significant improvement since his last visit.  Likely at least partially due to amiodarone.  Should continue to resolve.  He is already driving.  He is now 8 weeks out from surgery.  There are no restrictions on his activities, but he was advised to build into new activities gradually.  Plan: Follow-up with Dr. Stanford Breed I would be happy to see Mr. Skolnik back anytime in the future if I can be of any further assistance with his care  Steven Nakayama, Steven Lawrence [facility] [phone]

## 2020-03-01 ENCOUNTER — Telehealth (HOSPITAL_COMMUNITY): Payer: Self-pay | Admitting: Student-PharmD

## 2020-03-01 NOTE — Telephone Encounter (Signed)
Cardiac Rehab Medication Review by a Pharmacist  Does the patient  feel that his/her medications are working for him/her?  yes  Has the patient been experiencing any side effects to the medications prescribed?  Patient reports having some ongoing nausea after his surgery a couple of months ago, but does not attribute this to his medications. He states that it is decreasing and that his Zofran helps when he does experience it.   Does the patient measure his/her own blood pressure or blood glucose at home?  yes  Daily blood glucose (usually between 100-120 mg/dL), has stopped checking blood pressure regularly - just occasionally, but when he does it is normal.  Does the patient have any problems obtaining medications due to transportation or finances?   no  Understanding of regimen: good Understanding of indications: good Potential of compliance: good  Pharmacist Intervention: None needed at this time  Rebbeca Paul, PharmD PGY1 Pharmacy Resident 03/01/2020 11:58 AM

## 2020-03-02 ENCOUNTER — Ambulatory Visit (HOSPITAL_COMMUNITY)
Admission: RE | Admit: 2020-03-02 | Discharge: 2020-03-02 | Disposition: A | Discharge status: Home / Self Care | Payer: Medicare Other | Source: Ambulatory Visit | Attending: Cardiovascular Disease | Admitting: Cardiovascular Disease

## 2020-03-02 ENCOUNTER — Other Ambulatory Visit: Payer: Self-pay

## 2020-03-02 DIAGNOSIS — Z87891 Personal history of nicotine dependence: Secondary | ICD-10-CM | POA: Insufficient documentation

## 2020-03-02 DIAGNOSIS — R0989 Other specified symptoms and signs involving the circulatory and respiratory systems: Secondary | ICD-10-CM | POA: Insufficient documentation

## 2020-03-05 DIAGNOSIS — Z23 Encounter for immunization: Secondary | ICD-10-CM | POA: Diagnosis not present

## 2020-03-06 ENCOUNTER — Telehealth (HOSPITAL_COMMUNITY): Payer: Self-pay

## 2020-03-06 NOTE — Telephone Encounter (Signed)
Called made to patient Steven Lawrence to confirm appointment for 03/13/2020 @ 9:00 am. Pt confirms appointment for date/time.Health history completed with patient. COVID-19 screening completed and patient is negative at this time. Instructed to call C/R department with any symptoms or contact with know/suspected COVID-19 patient before coming to appointment. Reviewed directions and parking. Pt verbalized understanding.  Lesly Rubenstein MS, ACSM-EP-C, CCRP

## 2020-03-13 ENCOUNTER — Encounter (HOSPITAL_COMMUNITY)
Admission: RE | Admit: 2020-03-13 | Discharge: 2020-03-13 | Disposition: A | Discharge status: Home / Self Care | Payer: Medicare Other | Source: Ambulatory Visit | Attending: Cardiology | Admitting: Cardiology

## 2020-03-13 ENCOUNTER — Encounter (HOSPITAL_COMMUNITY): Payer: Self-pay

## 2020-03-13 ENCOUNTER — Other Ambulatory Visit: Payer: Self-pay

## 2020-03-13 VITALS — BP 122/60 | HR 86 | Resp 18 | Ht 69.25 in | Wt 157.6 lb

## 2020-03-13 DIAGNOSIS — Z951 Presence of aortocoronary bypass graft: Secondary | ICD-10-CM | POA: Insufficient documentation

## 2020-03-13 NOTE — Progress Notes (Signed)
Cardiac Individual Treatment Plan  Patient Details  Name: Steven Lawrence MRN: [id number] Date of Birth: Sep 09, 1950 Referring Provider:   Flowsheet Row CARDIAC REHAB PHASE II ORIENTATION from 03/13/2020 in [place]  Referring Provider Denice Bors. Stanford Breed, MD      Initial Encounter Date:  Flowsheet Row CARDIAC REHAB PHASE II ORIENTATION from 03/13/2020 in [place]  Date 03/13/20      Visit Diagnosis: S/P CABG x 5  Patient's Home Medications on Admission:  Current Outpatient Medications:  .  aspirin EC 81 MG EC tablet, Take 1 tablet (81 mg total) by mouth daily. Swallow whole., Disp: 30 tablet, Rfl: 11 .  atorvastatin (LIPITOR) 80 MG tablet, Take 80 mg by mouth daily. , Disp: , Rfl:  .  canagliflozin (INVOKANA) 100 MG TABS tablet, Take 300 mg by mouth daily., Disp: , Rfl:  .  cholestyramine light (PREVALITE) 4 GM/DOSE powder, Take 1 g by mouth daily as needed (For stomach)., Disp: , Rfl:  .  doxazosin (CARDURA) 2 MG tablet, Take 2 mg by mouth every morning. Takes for urinary freq, Disp: , Rfl:  .  LEVEMIR FLEXTOUCH 100 UNIT/ML FlexPen, Inject 20 Units into the skin at bedtime., Disp: , Rfl:  .  levothyroxine (SYNTHROID, LEVOTHROID) 50 MCG tablet, Take 75 mcg by mouth daily before breakfast. , Disp: , Rfl:  .  lisinopril (ZESTRIL) 5 MG tablet, Take 5 mg by mouth daily., Disp: , Rfl:  .  metFORMIN (GLUCOPHAGE) 1000 MG tablet, Take 1,000 mg by mouth 2 (two) times daily with a meal., Disp: , Rfl:  .  ondansetron (ZOFRAN) 8 MG tablet, TAKE 1 TABLET BY MOUTH EVERY 8 HOURS AS NEEDED FOR NAUSEA OR VOMITING., Disp: 20 tablet, Rfl: 0 .  pramipexole (MIRAPEX) 0.125 MG tablet, Take 0.125 mg by mouth as directed. x4 RLS at Bedtime, Disp: , Rfl:   Past Medical History: Past Medical History:  Diagnosis Date  . Acid reflux   . Arthritis   . BPH (benign prostatic hyperplasia)    mild  . CAD in native artery    a. NSTEMI 12/2019 s/p  CABGx5 with bovine AVR.  Marland Kitchen Depression   . 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 History of pneumonia as a child   . Hypercholesteremia   . Hypothyroidism   . RLS (restless legs syndrome)   . S/P aortic valve replacement with bioprosthetic valve   . S/P CABG (coronary artery bypass graft)   . Sleep apnea    does not use a cpap,primarily with upper airway resistance syndrome AHI 2.35/hr, RDI 10.2/hr ([name])  . Urinary frequency   . Wears glasses   . Wears hearing aid    both ears    Tobacco Use: Social History   Tobacco Use  Smoking Status Former Smoker  . Packs/day: 1.00  . Years: 53.00  . Pack years: 53.00  . Quit date: 12/16/2019  . Years since quitting: 0.2  Smokeless Tobacco Never Used    Labs: Recent Review Scientist, physiological    Labs for ITP Cardiac and Pulmonary Rehab Latest Ref Rng & Units 12/21/2019 12/21/2019 12/21/2019 12/21/2019 12/21/2019   Cholestrol 0 - 200 mg/dL - - - - -   LDLCALC 0 - 99 mg/dL - - - - -   HDL >40 mg/dL - - - - -   Trlycerides <150 mg/dL - - - - -   Hemoglobin A1c 4.8 - 5.6 % - - - - -  PHART 7.350 - 7.450 7.438 - 7.377 7.404 7.357   PCO2ART 32.0 - 48.0 mmHg 37.1 - 37.9 33.2 37.2   HCO3 20.0 - 28.0 mmol/L 25.1 - 22.6 20.8 20.8   TCO2 22 - 32 mmol/L 26 23 24 22 22    ACIDBASEDEF 0.0 - 2.0 mmol/L - - 3.0(H) 3.0(H) 4.0(H)   O2SAT % 100.0 - 99.0 99.0 97.0      Capillary Blood Glucose: Lab Results  Component Value Date   GLUCAP 141 (H) 12/26/2019   GLUCAP 205 (H) 12/25/2019   GLUCAP 165 (H) 12/25/2019   GLUCAP 148 (H) 12/25/2019   GLUCAP 155 (H) 12/25/2019    POCT Glucose    Row Name 03/13/20 0929             POCT Blood Glucose   Pre-Exercise 144 mg/dL  patient reported fasting am blood sugar              Exercise Target Goals: Exercise Program Goal: Individual exercise prescription set using results from initial 6 min walk test and THRR while considering  patient's activity barriers and safety.    Exercise Prescription Goal: Initial exercise prescription builds to 30-45 minutes a day of aerobic activity, 2-3 days per week.  Home exercise guidelines will be given to patient during program as part of exercise prescription that the participant will acknowledge.  Activity Barriers & Risk Stratification:  Activity Barriers & Cardiac Risk Stratification - 03/13/20 1020      Activity Barriers & Cardiac Risk Stratification   Activity Barriers Arthritis;Neck/Spine Problems;Joint Problems;Balance Concerns    Cardiac Risk Stratification High           6 Minute Walk:  6 Minute Walk    Row Name 03/13/20 0945         6 Minute Walk   Phase Initial     Distance 1861 feet     Walk Time 6 minutes     # of Rest Breaks 0     MPH 3.52     METS 4.56     RPE 13     Perceived Dyspnea  0     VO2 Peak 15.98     Symptoms Yes (comment)     Comments Bilateral chronic knee pain 4/10     Resting HR 80 bpm     Resting BP 122/60     Resting Oxygen Saturation  96 %     Exercise Oxygen Saturation  during 6 min walk 97 %     Max Ex. HR 123 bpm     Max Ex. BP 142/70     2 Minute Post BP 120/66            Oxygen Initial Assessment:   Oxygen Re-Evaluation:   Oxygen Discharge (Final Oxygen Re-Evaluation):   Initial Exercise Prescription:  Initial Exercise Prescription - 03/13/20 1000      Date of Initial Exercise RX and Referring Provider   Date 03/13/20    Referring Provider Denice Bors. Stanford Breed, MD    Expected Discharge Date 05/11/20      T5 Nustep   Level 3    SPM 85    Minutes 15    METs 2.4      Track   Laps 12    Minutes 15    METs 2.39      Prescription Details   Frequency (times per week) 3    Duration Progress to 30 minutes of continuous aerobic without signs/symptoms of physical distress  Intensity   THRR 40-80% of Max Heartrate 60-121    Ratings of Perceived Exertion 11-13    Perceived Dyspnea 0-4      Progression   Progression Continue progressive  overload as per policy without signs/symptoms or physical distress.      Resistance Training   Training Prescription Yes    Weight 4 lbs    Reps 10-15           Perform Capillary Blood Glucose checks as needed.  Exercise Prescription Changes:   Exercise Comments:   Exercise Goals and Review:   Exercise Goals    Row Name 03/13/20 1021             Exercise Goals   Increase Physical Activity Yes       Intervention Provide advice, education, support and counseling about physical activity/exercise needs.;Develop an individualized exercise prescription for aerobic and resistive training based on initial evaluation findings, risk stratification, comorbidities and participant's personal goals.       Expected Outcomes Short Term: Attend rehab on a regular basis to increase amount of physical activity.;Long Term: Add in home exercise to make exercise part of routine and to increase amount of physical activity.;Long Term: Exercising regularly at least 3-5 days a week.       Increase Strength and Stamina Yes       Intervention Provide advice, education, support and counseling about physical activity/exercise needs.;Develop an individualized exercise prescription for aerobic and resistive training based on initial evaluation findings, risk stratification, comorbidities and participant's personal goals.       Expected Outcomes Short Term: Increase workloads from initial exercise prescription for resistance, speed, and METs.;Short Term: Perform resistance training exercises routinely during rehab and add in resistance training at home;Long Term: Improve cardiorespiratory fitness, muscular endurance and strength as measured by increased METs and functional capacity (6MWT)       Able to understand and use rate of perceived exertion (RPE) scale Yes       Intervention Provide education and explanation on how to use RPE scale       Expected Outcomes Short Term: Able to use RPE daily in rehab to  express subjective intensity level;Long Term:  Able to use RPE to guide intensity level when exercising independently       Knowledge and understanding of Target Heart Rate Range (THRR) Yes       Intervention Provide education and explanation of THRR including how the numbers were predicted and where they are located for reference       Expected Outcomes Short Term: Able to state/look up THRR;Short Term: Able to use daily as guideline for intensity in rehab;Long Term: Able to use THRR to govern intensity when exercising independently       Understanding of Exercise Prescription Yes       Intervention Provide education, explanation, and written materials on patient's individual exercise prescription       Expected Outcomes Short Term: Able to explain program exercise prescription;Long Term: Able to explain home exercise prescription to exercise independently              Exercise Goals Re-Evaluation :   Discharge Exercise Prescription (Final Exercise Prescription Changes):   Nutrition:  Target Goals: Understanding of nutrition guidelines, daily intake of sodium 1500mg , cholesterol 200mg , calories 30% from fat and 7% or less from saturated fats, daily to have 5 or more servings of fruits and vegetables.  Biometrics:  Pre Biometrics - 03/13/20 [id number]  Pre Biometrics   Height 5' 9.25" (1.759 m)    Waist Circumference 36.5 inches    Hip Circumference 38.5 inches    Waist to Hip Ratio 0.95 %    BMI (Calculated) 23.11    Triceps Skinfold 11 mm    % Body Fat 22.9 %    Grip Strength 30 kg    Flexibility 17.5 in    Single Leg Stand 7.56 seconds   moderate fall risk           Nutrition Therapy Plan and Nutrition Goals:   Nutrition Assessments:  MEDIFICTS Score Key:  ?70 Need to make dietary changes   40-70 Heart Healthy Diet  ? 40 Therapeutic Level Cholesterol Diet    Picture Your Plate Scores:  <37 Unhealthy dietary pattern with much room for improvement.  41-50  Dietary pattern unlikely to meet recommendations for good health and room for improvement.  51-60 More healthful dietary pattern, with some room for improvement.   >60 Healthy dietary pattern, although there may be some specific behaviors that could be improved.    Nutrition Goals Re-Evaluation:   Nutrition Goals Re-Evaluation:   Nutrition Goals Discharge (Final Nutrition Goals Re-Evaluation):   Psychosocial: Target Goals: Acknowledge presence or absence of significant depression and/or stress, maximize coping skills, provide positive support system. Participant is able to verbalize types and ability to use techniques and skills needed for reducing stress and depression.  Initial Review & Psychosocial Screening:  Initial Psych Review & Screening - 03/13/20 0922      Initial Review   Current issues with None Identified      Family Dynamics   Good Support System? Yes    Comments Mr. Behler presents to cardiac rehab orientation with a positive attitude and outlook. He admits to a strong support system including his wife, daughter, grandchildren, and friends. He is walking an hour a day outdoors which he enjoys. His hobbies are yard work and Psychologist, educational. He has resumed minimal yard work which he is enthused about. He denies psychosocial barriers to self health managment or participation in cardiac rehab. No interventions needed at this time.      Barriers   Psychosocial barriers to participate in program There are no identifiable barriers or psychosocial needs.      Screening Interventions   Interventions Encouraged to exercise           Quality of Life Scores:  Quality of Life - 03/13/20 1033      Quality of Life   Select Quality of Life      Quality of Life Scores   Health/Function Pre 27.17 %    Socioeconomic Pre 26.29 %    Psych/Spiritual Pre 28.57 %    Family Pre 27.1 %    GLOBAL Pre 27.26 %          Scores of 19 and below usually indicate a poorer quality of  life in these areas.  A difference of  2-3 points is a clinically meaningful difference.  A difference of 2-3 points in the total score of the Quality of Life Index has been associated with significant improvement in overall quality of life, self-image, physical symptoms, and general health in studies assessing change in quality of life.  PHQ-9: Recent Review Flowsheet Data    Depression screen [facility] 2/9 03/13/2020   Decreased Interest 0   Down, Depressed, Hopeless 0   PHQ - 2 Score 0     Interpretation of Total Score  Total Score  Depression Severity:  1-4 = Minimal depression, 5-9 = Mild depression, 10-14 = Moderate depression, 15-19 = Moderately severe depression, 20-27 = Severe depression   Psychosocial Evaluation and Intervention:   Psychosocial Re-Evaluation:   Psychosocial Discharge (Final Psychosocial Re-Evaluation):   Vocational Rehabilitation: Provide vocational rehab assistance to qualifying candidates.   Vocational Rehab Evaluation & Intervention:  Vocational Rehab - 03/13/20 1100      Initial Vocational Rehab Evaluation & Intervention   Assessment shows need for Vocational Rehabilitation No           Education: Education Goals: Education classes will be provided on a weekly basis, covering required topics. Participant will state understanding/return demonstration of topics presented.  Learning Barriers/Preferences:  Learning Barriers/Preferences - 03/13/20 1024      Learning Barriers/Preferences   Learning Barriers Sight;Hearing   wears glasses; bilateral hearing aids   Learning Preferences Written Material;Pictoral;Computer/Internet           Education Topics: Count Your Pulse:  -Group instruction provided by verbal instruction, demonstration, patient participation and written materials to support subject.  Instructors address importance of being able to find your pulse and how to count your pulse when at home without a heart monitor.  Patients get hands  on experience counting their pulse with staff help and individually.   Heart Attack, Angina, and Risk Factor Modification:  -Group instruction provided by verbal instruction, video, and written materials to support subject.  Instructors address signs and symptoms of angina and heart attacks.    Also discuss risk factors for heart disease and how to make changes to improve heart health risk factors.   Functional Fitness:  -Group instruction provided by verbal instruction, demonstration, patient participation, and written materials to support subject.  Instructors address safety measures for doing things around the house.  Discuss how to get up and down off the floor, how to pick things up properly, how to safely get out of a chair without assistance, and balance training.   Meditation and Mindfulness:  -Group instruction provided by verbal instruction, patient participation, and written materials to support subject.  Instructor addresses importance of mindfulness and meditation practice to help reduce stress and improve awareness.  Instructor also leads participants through a meditation exercise.    Stretching for Flexibility and Mobility:  -Group instruction provided by verbal instruction, patient participation, and written materials to support subject.  Instructors lead participants through series of stretches that are designed to increase flexibility thus improving mobility.  These stretches are additional exercise for major muscle groups that are typically performed during regular warm up and cool down.   Hands Only CPR:  -Group verbal, video, and participation provides a basic overview of AHA guidelines for community CPR. Role-play of emergencies allow participants the opportunity to practice calling for help and chest compression technique with discussion of AED use.   Hypertension: -Group verbal and written instruction that provides a basic overview of hypertension including the most  recent diagnostic guidelines, risk factor reduction with self-care instructions and medication management.    Nutrition I class: Heart Healthy Eating:  -Group instruction provided by PowerPoint slides, verbal discussion, and written materials to support subject matter. The instructor gives an explanation and review of the Therapeutic Lifestyle Changes diet recommendations, which includes a discussion on lipid goals, dietary fat, sodium, fiber, plant stanol/sterol esters, sugar, and the components of a well-balanced, healthy diet.   Nutrition II class: Lifestyle Skills:  -Group instruction provided by PowerPoint slides, verbal discussion, and written materials to support  subject matter. The instructor gives an explanation and review of label reading, grocery shopping for heart health, heart healthy recipe modifications, and ways to make healthier choices when eating out.   Diabetes Question & Answer:  -Group instruction provided by PowerPoint slides, verbal discussion, and written materials to support subject matter. The instructor gives an explanation and review of diabetes co-morbidities, pre- and post-prandial blood glucose goals, pre-exercise blood glucose goals, signs, symptoms, and treatment of hypoglycemia and hyperglycemia, and foot care basics.   Diabetes Blitz:  -Group instruction provided by PowerPoint slides, verbal discussion, and written materials to support subject matter. The instructor gives an explanation and review of the physiology behind type 1 and type 2 diabetes, diabetes medications and rational behind using different medications, pre- and post-prandial blood glucose recommendations and Hemoglobin A1c goals, diabetes diet, and exercise including blood glucose guidelines for exercising safely.    Portion Distortion:  -Group instruction provided by PowerPoint slides, verbal discussion, written materials, and food models to support subject matter. The instructor gives an  explanation of serving size versus portion size, changes in portions sizes over the last 20 years, and what consists of a serving from each food group.   Stress Management:  -Group instruction provided by verbal instruction, video, and written materials to support subject matter.  Instructors review role of stress in heart disease and how to cope with stress positively.     Exercising on Your Own:  -Group instruction provided by verbal instruction, power point, and written materials to support subject.  Instructors discuss benefits of exercise, components of exercise, frequency and intensity of exercise, and end points for exercise.  Also discuss use of nitroglycerin and activating EMS.  Review options of places to exercise outside of rehab.  Review guidelines for sex with heart disease.   Cardiac Drugs I:  -Group instruction provided by verbal instruction and written materials to support subject.  Instructor reviews cardiac drug classes: antiplatelets, anticoagulants, beta blockers, and statins.  Instructor discusses reasons, side effects, and lifestyle considerations for each drug class.   Cardiac Drugs II:  -Group instruction provided by verbal instruction and written materials to support subject.  Instructor reviews cardiac drug classes: angiotensin converting enzyme inhibitors (ACE-I), angiotensin II receptor blockers (ARBs), nitrates, and calcium channel blockers.  Instructor discusses reasons, side effects, and lifestyle considerations for each drug class.   Anatomy and Physiology of the Circulatory System:  Group verbal and written instruction and models provide basic cardiac anatomy and physiology, with the coronary electrical and arterial systems. Review of: AMI, Angina, Valve disease, Heart Failure, Peripheral Artery Disease, Cardiac Arrhythmia, Pacemakers, and the ICD.   Other Education:  -Group or individual verbal, written, or video instructions that support the educational  goals of the cardiac rehab program.   Holiday Eating Survival Tips:  -Group instruction provided by PowerPoint slides, verbal discussion, and written materials to support subject matter. The instructor gives patients tips, tricks, and techniques to help them not only survive but enjoy the holidays despite the onslaught of food that accompanies the holidays.   Knowledge Questionnaire Score:  Knowledge Questionnaire Score - 03/13/20 1033      Knowledge Questionnaire Score   Pre Score 23/24           Core Components/Risk Factors/Patient Goals at Admission:  Personal Goals and Risk Factors at Admission - 03/13/20 1057      Core Components/Risk Factors/Patient Goals on Admission    Weight Management Weight Maintenance    Diabetes Yes  Intervention Provide education about signs/symptoms and action to take for hypo/hyperglycemia.;Provide education about proper nutrition, including hydration, and aerobic/resistive exercise prescription along with prescribed medications to achieve blood glucose in normal ranges: Fasting glucose 65-99 mg/dL    Expected Outcomes Short Term: Participant verbalizes understanding of the signs/symptoms and immediate care of hyper/hypoglycemia, proper foot care and importance of medication, aerobic/resistive exercise and nutrition plan for blood glucose control.;Long Term: Attainment of HbA1C < 7%.    Hypertension Yes    Intervention Provide education on lifestyle modifcations including regular physical activity/exercise, weight management, moderate sodium restriction and increased consumption of fresh fruit, vegetables, and low fat dairy, alcohol moderation, and smoking cessation.;Monitor prescription use compliance.    Expected Outcomes Short Term: Continued assessment and intervention until BP is < 140/60mm HG in hypertensive participants. < 130/84mm HG in hypertensive participants with diabetes, heart failure or chronic kidney disease.;Long Term: Maintenance of  blood pressure at goal levels.    Lipids Yes    Intervention Provide education and support for participant on nutrition & aerobic/resistive exercise along with prescribed medications to achieve LDL 70mg , HDL >40mg .    Expected Outcomes Short Term: Participant states understanding of desired cholesterol values and is compliant with medications prescribed. Participant is following exercise prescription and nutrition guidelines.;Long Term: Cholesterol controlled with medications as prescribed, with individualized exercise RX and with personalized nutrition plan. Value goals: LDL < 70mg , HDL > 40 mg.           Core Components/Risk Factors/Patient Goals Review:    Core Components/Risk Factors/Patient Goals at Discharge (Final Review):    ITP Comments:  ITP Comments    Row Name 03/13/20 0913           ITP Comments Dr. Fransico Him, Medical Director Cardiac Re[facility]              Comments: Patient attended orientation on 03/13/2020 to review rules and guidelines for program.  Completed 6 minute walk test, Intitial ITP, and exercise prescription.  VSS. Telemetry-NSR.  Asymptomatic. Safety measures and social distancing in place per CDC guidelines.

## 2020-03-14 ENCOUNTER — Encounter: Payer: Self-pay | Admitting: *Deleted

## 2020-03-19 ENCOUNTER — Encounter (HOSPITAL_COMMUNITY)
Admission: RE | Admit: 2020-03-19 | Discharge: 2020-03-19 | Disposition: A | Discharge status: Home / Self Care | Payer: Medicare Other | Source: Ambulatory Visit | Attending: Cardiology | Admitting: Cardiology

## 2020-03-19 ENCOUNTER — Other Ambulatory Visit: Payer: Self-pay

## 2020-03-19 DIAGNOSIS — Z951 Presence of aortocoronary bypass graft: Secondary | ICD-10-CM | POA: Diagnosis not present

## 2020-03-19 LAB — GLUCOSE, CAPILLARY
Glucose-Capillary: 171 mg/dL — ABNORMAL HIGH (ref 70–99)
Glucose-Capillary: 138 mg/dL — ABNORMAL HIGH (ref 70–99)

## 2020-03-19 NOTE — Progress Notes (Signed)
Daily Session Note  Patient Details  Name: Steven Lawrence MRN: [id number] Date of Birth: 05-Jun-1950 Referring Provider:   Flowsheet Row CARDIAC REHAB PHASE II ORIENTATION from 03/13/2020 in [place]  Referring Provider Denice Bors. Stanford Breed, MD      Encounter Date: 03/19/2020  Check In:  Session Check In - 03/19/20 0849      Check-In   Supervising physician immediately available to respond to emergencies [facility] immediately available    Physician(s) Dr. Florene Glen    Location [facility]    Staff Present Seward Carol, MS, ACSM CEP, Exercise Physiologist;David Lilyan Punt, MS, EP-C, CCRP;Other;Kamir Selover Wilber Oliphant, RN, BSN    Virtual Visit No    Medication changes reported     No    Fall or balance concerns reported    No    Tobacco Cessation No Change    Warm-up and Cool-down Performed on first and last piece of equipment    Resistance Training Performed Yes    VAD Patient? No    PAD/SET Patient? No      Pain Assessment   Currently in Pain? No/denies    Multiple Pain Sites No           Capillary Blood Glucose: Results for orders placed or performed during the hospital encounter of 03/19/20 (from the past 24 hour(s))  Glucose, capillary     Status: Abnormal   Collection Time: 03/19/20  8:53 AM  Result Value Ref Range   Glucose-Capillary 171 (H) 70 - 99 mg/dL  Glucose, capillary     Status: Abnormal   Collection Time: 03/19/20  9:56 AM  Result Value Ref Range   Glucose-Capillary 138 (H) 70 - 99 mg/dL     Exercise Prescription Changes - 03/19/20 1000      Response to Exercise   Blood Pressure (Admit) 118/54    Blood Pressure (Exercise) 146/72    Blood Pressure (Exit) 110/68    Heart Rate (Admit) 88 bpm    Heart Rate (Exercise) 109 bpm    Heart Rate (Exit) 78 bpm    Rating of Perceived Exertion (Exercise) 13    Symptoms None    Comments Pt's first day of exercise in the [facility]2 program    Duration Progress to 30  minutes of  aerobic without signs/symptoms of physical distress    Intensity THRR unchanged      Progression   Progression Continue to progress workloads to maintain intensity without signs/symptoms of physical distress.    Average METs 2.65      Resistance Training   Training Prescription Yes    Weight 4 lbs    Reps 10-15    Time 10 Minutes      Interval Training   Interval Training No      T5 Nustep   Level 3    SPM 90    Minutes 15    METs 2.1      Track   Laps 19    Minutes 15    METs 3.21           Social History   Tobacco Use  Smoking Status Former Smoker  . Packs/day: 1.00  . Years: 53.00  . Pack years: 53.00  . Quit date: 12/16/2019  . Years since quitting: 0.2  Smokeless Tobacco Never Used    Goals Met:  Proper associated with RPD/PD & O2 Sat Exercise tolerated well No report of cardiac concerns or symptoms Strength training completed today  Goals Unmet:  Not Applicable  Comments: Pt in today for his first day of exercise in Cardiac Rehab.  NSR with no noted ectopy.  Pt tolerated well with no complaints.   Maurice Small RN, BSN Cardiac and Pulmonary Rehab Nurse Navigator     Dr. Fransico Him is Medical Director for Cardiac Rehab at [facility].

## 2020-03-21 ENCOUNTER — Other Ambulatory Visit: Payer: Self-pay

## 2020-03-21 ENCOUNTER — Encounter (HOSPITAL_COMMUNITY)
Admission: RE | Admit: 2020-03-21 | Discharge: 2020-03-21 | Disposition: A | Discharge status: Home / Self Care | Payer: Medicare Other | Source: Ambulatory Visit | Attending: Cardiology | Admitting: Cardiology

## 2020-03-21 DIAGNOSIS — Z951 Presence of aortocoronary bypass graft: Secondary | ICD-10-CM | POA: Diagnosis not present

## 2020-03-21 DIAGNOSIS — H6502 Acute serous otitis media, left ear: Secondary | ICD-10-CM | POA: Diagnosis not present

## 2020-03-21 LAB — GLUCOSE, CAPILLARY: Glucose-Capillary: 113 mg/dL — ABNORMAL HIGH (ref 70–99)

## 2020-03-23 ENCOUNTER — Other Ambulatory Visit: Payer: Self-pay

## 2020-03-23 ENCOUNTER — Encounter (HOSPITAL_COMMUNITY)
Admission: RE | Admit: 2020-03-23 | Discharge: 2020-03-23 | Disposition: A | Discharge status: Home / Self Care | Payer: Medicare Other | Source: Ambulatory Visit | Attending: Cardiology | Admitting: Cardiology

## 2020-03-23 DIAGNOSIS — Z951 Presence of aortocoronary bypass graft: Secondary | ICD-10-CM

## 2020-03-23 NOTE — Progress Notes (Signed)
Cardiac Individual Treatment Plan  Patient Details  Name: Steven Lawrence MRN: [id number] Date of Birth: November 07, 1950 Referring Provider:   Flowsheet Row CARDIAC REHAB PHASE II ORIENTATION from 03/13/2020 in [facility]  Referring Provider Denice Bors. Stanford Breed, MD      Initial Encounter Date:  Flowsheet Row CARDIAC REHAB PHASE II ORIENTATION from 03/13/2020 in [facility]  Date 03/13/20      Visit Diagnosis: S/P CABG x 5  Patient's Home Medications on Admission:  Current Outpatient Medications:    aspirin EC 81 MG EC tablet, Take 1 tablet (81 mg total) by mouth daily. Swallow whole., Disp: 30 tablet, Rfl: 11   atorvastatin (LIPITOR) 80 MG tablet, Take 80 mg by mouth daily. , Disp: , Rfl:    canagliflozin (INVOKANA) 100 MG TABS tablet, Take 300 mg by mouth daily., Disp: , Rfl:    cholestyramine light (PREVALITE) 4 GM/DOSE powder, Take 1 g by mouth daily as needed (For stomach)., Disp: , Rfl:    doxazosin (CARDURA) 2 MG tablet, Take 2 mg by mouth every morning. Takes for urinary freq, Disp: , Rfl:    LEVEMIR FLEXTOUCH 100 UNIT/ML FlexPen, Inject 20 Units into the skin at bedtime., Disp: , Rfl:    levothyroxine (SYNTHROID, LEVOTHROID) 50 MCG tablet, Take 75 mcg by mouth daily before breakfast. , Disp: , Rfl:    lisinopril (ZESTRIL) 5 MG tablet, Take 5 mg by mouth daily., Disp: , Rfl:    metFORMIN (GLUCOPHAGE) 1000 MG tablet, Take 1,000 mg by mouth 2 (two) times daily with a meal., Disp: , Rfl:    ondansetron (ZOFRAN) 8 MG tablet, TAKE 1 TABLET BY MOUTH EVERY 8 HOURS AS NEEDED FOR NAUSEA OR VOMITING., Disp: 20 tablet, Rfl: 0   pramipexole (MIRAPEX) 0.125 MG tablet, Take 0.125 mg by mouth as directed. x4 RLS at Bedtime, Disp: , Rfl:   Past Medical History: Past Medical History:  Diagnosis Date   Acid reflux    Arthritis    BPH (benign prostatic hyperplasia)    mild   CAD in native artery    a. NSTEMI 12/2019 s/p  CABGx5 with bovine AVR.   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, RDI 10.2/hr ([name])   Urinary frequency    Wears glasses    Wears hearing aid    both ears    Tobacco Use: Social History   Tobacco Use  Smoking Status Former Smoker   Packs/day: 1.00   Years: 53.00   Pack years: 53.00   Quit date: 12/16/2019   Years since quitting: 0.2  Smokeless Tobacco Never Used    Labs: Recent Review Scientist, physiological    Labs for ITP Cardiac and Pulmonary Rehab Latest Ref Rng & Units 12/21/2019 12/21/2019 12/21/2019 12/21/2019 12/21/2019   Cholestrol 0 - 200 mg/dL - - - - -   LDLCALC 0 - 99 mg/dL - - - - -   HDL >40 mg/dL - - - - -   Trlycerides <150 mg/dL - - - - -   Hemoglobin A1c 4.8 - 5.6 % - - - - -  PHART 7.350 - 7.450 7.438 - 7.377 7.404 7.357   PCO2ART 32.0 - 48.0 mmHg 37.1 - 37.9 33.2 37.2   HCO3 20.0 - 28.0 mmol/L 25.1 - 22.6 20.8 20.8   TCO2 22 - 32 mmol/L 26 23 24 22 22    ACIDBASEDEF 0.0 - 2.0 mmol/L - - 3.0(H) 3.0(H) 4.0(H)   O2SAT % 100.0 - 99.0 99.0 97.0      Capillary Blood Glucose: Lab Results  Component Value Date   GLUCAP 113 (H) 03/21/2020   GLUCAP 138 (H) 03/19/2020   GLUCAP 171 (H) 03/19/2020   GLUCAP 141 (H) 12/26/2019   GLUCAP 205 (H) 12/25/2019    POCT Glucose    Row Name 03/13/20 0929             POCT Blood Glucose   Pre-Exercise 144 mg/dL  patient reported fasting am blood sugar              Exercise Target Goals: Exercise Program Goal: Individual exercise prescription set using results from initial 6 min walk test and THRR while considering  patients activity barriers and safety.    Exercise Prescription Goal: Starting with aerobic activity 30 plus minutes a day, 3 days per week for initial exercise prescription. Provide home exercise prescription and guidelines that participant acknowledges understanding prior to discharge.  Activity Barriers & Risk Stratification:  Activity Barriers & Cardiac Risk Stratification - 03/13/20 1020      Activity Barriers & Cardiac Risk Stratification   Activity Barriers Arthritis;Neck/Spine Problems;Joint Problems;Balance Concerns    Cardiac Risk Stratification High           6 Minute Walk:  6 Minute Walk    Row Name 03/13/20 0945         6 Minute Walk   Phase Initial     Distance 1861 feet     Walk Time 6 minutes     # of Rest Breaks 0     MPH 3.52     METS 4.56     RPE 13     Perceived Dyspnea  0     VO2 Peak 15.98     Symptoms Yes (comment)     Comments Bilateral chronic knee pain 4/10     Resting HR 80 bpm     Resting BP 122/60     Resting Oxygen Saturation  96 %     Exercise Oxygen Saturation  during 6 min walk 97 %     Max Ex. HR 123 bpm     Max Ex. BP 142/70     2 Minute Post BP 120/66            Oxygen Initial Assessment:   Oxygen Re-Evaluation:   Oxygen Discharge (Final Oxygen Re-Evaluation):   Initial Exercise Prescription:  Initial Exercise Prescription - 03/13/20 1000      Date of Initial Exercise RX and Referring Provider   Date 03/13/20    Referring Provider Denice Bors. Stanford Breed, MD    Expected Discharge Date 05/11/20      T5 Nustep   Level 3    SPM 85    Minutes 15    METs 2.4      Track   Laps 12    Minutes 15    METs 2.39      Prescription Details   Frequency (times per week) 3    Duration Progress to 30 minutes of continuous aerobic without signs/symptoms of physical distress      Intensity  THRR 40-80% of Max Heartrate 60-121    Ratings of Perceived Exertion 11-13    Perceived Dyspnea 0-4      Progression   Progression Continue progressive overload as per policy  without signs/symptoms or physical distress.      Resistance Training   Training Prescription Yes    Weight 4 lbs    Reps 10-15           Perform Capillary Blood Glucose checks as needed.  Exercise Prescription Changes:   Exercise Prescription Changes    Row Name 03/19/20 1000             Response to Exercise   Blood Pressure (Admit) 118/54       Blood Pressure (Exercise) 146/72       Blood Pressure (Exit) 110/68       Heart Rate (Admit) 88 bpm       Heart Rate (Exercise) 109 bpm       Heart Rate (Exit) 78 bpm       Rating of Perceived Exertion (Exercise) 13       Symptoms None       Comments Pt's first day of exercise in the CRP2 program       Duration Progress to 30 minutes of  aerobic without signs/symptoms of physical distress       Intensity THRR unchanged               Progression   Progression Continue to progress workloads to maintain intensity without signs/symptoms of physical distress.       Average METs 2.65               Resistance Training   Training Prescription Yes       Weight 4 lbs       Reps 10-15       Time 10 Minutes               Interval Training   Interval Training No               T5 Nustep   Level 3       SPM 90       Minutes 15       METs 2.1               Track   Laps 19       Minutes 15       METs 3.21              Exercise Comments:   Exercise Comments    Row Name 03/19/20 1012           Exercise Comments Pt's first day of exercise in the CRP2 program. Pt tolerated session well with no complaints.              Exercise Goals and Review:   Exercise Goals    Row Name 03/13/20 1021             Exercise Goals   Increase Physical Activity Yes       Intervention Provide advice, education, support and counseling about physical activity/exercise needs.;Develop an individualized exercise prescription for aerobic and resistive training based on initial evaluation findings, risk stratification,  comorbidities and participant's personal goals.       Expected Outcomes Short Term: Attend rehab on a regular basis to increase amount of physical activity.;Long Term: Add in home exercise to make exercise part of routine and to increase amount of  physical activity.;Long Term: Exercising regularly at least 3-5 days a week.       Increase Strength and Stamina Yes       Intervention Provide advice, education, support and counseling about physical activity/exercise needs.;Develop an individualized exercise prescription for aerobic and resistive training based on initial evaluation findings, risk stratification, comorbidities and participant's personal goals.       Expected Outcomes Short Term: Increase workloads from initial exercise prescription for resistance, speed, and METs.;Short Term: Perform resistance training exercises routinely during rehab and add in resistance training at home;Long Term: Improve cardiorespiratory fitness, muscular endurance and strength as measured by increased METs and functional capacity (6MWT)       Able to understand and use rate of perceived exertion (RPE) scale Yes       Intervention Provide education and explanation on how to use RPE scale       Expected Outcomes Short Term: Able to use RPE daily in rehab to express subjective intensity level;Long Term:  Able to use RPE to guide intensity level when exercising independently       Knowledge and understanding of Target Heart Rate Range (THRR) Yes       Intervention Provide education and explanation of THRR including how the numbers were predicted and where they are located for reference       Expected Outcomes Short Term: Able to state/look up THRR;Short Term: Able to use daily as guideline for intensity in rehab;Long Term: Able to use THRR to govern intensity when exercising independently       Understanding of Exercise Prescription Yes       Intervention Provide education, explanation, and written materials on patient's  individual exercise prescription       Expected Outcomes Short Term: Able to explain program exercise prescription;Long Term: Able to explain home exercise prescription to exercise independently              Exercise Goals Re-Evaluation :  Exercise Goals Re-Evaluation    Row Name 03/19/20 1009             Exercise Goal Re-Evaluation   Exercise Goals Review Increase Physical Activity;Increase Strength and Stamina;Able to understand and use rate of perceived exertion (RPE) scale;Knowledge and understanding of Target Heart Rate Range (THRR);Able to check pulse independently;Understanding of Exercise Prescription       Comments Pt's first day of exercise in the CRP2 program. Pt understands the RPE scale, THRR, and his exercise Rx.       Expected Outcomes Will continue to monitor patient and progress as tolerated.               Discharge Exercise Prescription (Final Exercise Prescription Changes):  Exercise Prescription Changes - 03/19/20 1000      Response to Exercise   Blood Pressure (Admit) 118/54    Blood Pressure (Exercise) 146/72    Blood Pressure (Exit) 110/68    Heart Rate (Admit) 88 bpm    Heart Rate (Exercise) 109 bpm    Heart Rate (Exit) 78 bpm    Rating of Perceived Exertion (Exercise) 13    Symptoms None    Comments Pt's first day of exercise in the CRP2 program    Duration Progress to 30 minutes of  aerobic without signs/symptoms of physical distress    Intensity THRR unchanged      Progression   Progression Continue to progress workloads to maintain intensity without signs/symptoms of physical distress.    Average METs 2.65  Resistance Training   Training Prescription Yes    Weight 4 lbs    Reps 10-15    Time 10 Minutes      Interval Training   Interval Training No      T5 Nustep   Level 3    SPM 90    Minutes 15    METs 2.1      Track   Laps 19    Minutes 15    METs 3.21           Nutrition:  Target Goals: Understanding of nutrition  guidelines, daily intake of sodium 1500mg , cholesterol 200mg , calories 30% from fat and 7% or less from saturated fats, daily to have 5 or more servings of fruits and vegetables.  Biometrics:  Pre Biometrics - 03/13/20 0918      Pre Biometrics   Height 5' 9.25" (1.759 m)    Waist Circumference 36.5 inches    Hip Circumference 38.5 inches    Waist to Hip Ratio 0.95 %    BMI (Calculated) 23.11    Triceps Skinfold 11 mm    % Body Fat 22.9 %    Grip Strength 30 kg    Flexibility 17.5 in    Single Leg Stand 7.56 seconds   moderate fall risk           Nutrition Therapy Plan and Nutrition Goals:  Nutrition Therapy & Goals - 03/26/20 1123      Personal Nutrition Goals   Nutrition Goal Pt to build a healthy plate including vegetables, fruits, whole grains, and low-fat dairy products in a heart healthy meal plan.    Personal Goal #2 Pt to keep foodlog to determine carbs per meal    Personal Goal #3 Pt to learn to modify carb intake at meals and snacks      Intervention Plan   Intervention Prescribe, educate and counsel regarding individualized specific dietary modifications aiming towards targeted core components such as weight, hypertension, lipid management, diabetes, heart failure and other comorbidities.;Nutrition handout(s) given to patient.    Expected Outcomes Short Term Goal: A plan has been developed with personal nutrition goals set during dietitian appointment.;Long Term Goal: Adherence to prescribed nutrition plan.           Nutrition Assessments:  MEDIFICTS Score Key:  ?70 Need to make dietary changes   40-70 Heart Healthy Diet  ? 40 Therapeutic Level Cholesterol Diet   Picture Your Plate Scores:  [id number] Unhealthy dietary pattern with much room for improvement.  41-50 Dietary pattern unlikely to meet recommendations for good health and room for improvement.  51-60 More healthful dietary pattern, with some room for improvement.   >60 Healthy dietary pattern,  although there may be some specific behaviors that could be improved.    Nutrition Goals Re-Evaluation:  Nutrition Goals Re-Evaluation    [place] Name 03/26/20 1124             Goals   Current Weight 157 lb (71.2 kg)       Nutrition Goal Pt to build a healthy plate including vegetables, fruits, whole grains, and low-fat dairy products in a heart healthy meal plan.               Personal Goal #2 Re-Evaluation   Personal Goal #2 Pt to keep foodlog to determine carbs per meal               Personal Goal #3 Re-Evaluation   Personal Goal #3 Pt to learn  to modify carb intake at meals and snacks              Nutrition Goals Discharge (Final Nutrition Goals Re-Evaluation):  Nutrition Goals Re-Evaluation - 03/26/20 1124      Goals   Current Weight 157 lb (71.2 kg)    Nutrition Goal Pt to build a healthy plate including vegetables, fruits, whole grains, and low-fat dairy products in a heart healthy meal plan.      Personal Goal #2 Re-Evaluation   Personal Goal #2 Pt to keep foodlog to determine carbs per meal      Personal Goal #3 Re-Evaluation   Personal Goal #3 Pt to learn to modify carb intake at meals and snacks           Psychosocial: Target Goals: Acknowledge presence or absence of significant depression and/or stress, maximize coping skills, provide positive support system. Participant is able to verbalize types and ability to use techniques and skills needed for reducing stress and depression.  Initial Review & Psychosocial Screening:  Initial Psych Review & Screening - 03/13/20 0922      Initial Review   Current issues with None Identified      Family Dynamics   Good Support System? Yes    Comments Mr. Misch presents to cardiac rehab orientation with a positive attitude and outlook. He admits to a strong support system including his wife, daughter, grandchildren, and friends. He is walking an hour a day outdoors which he enjoys. His hobbies are yard work and Therapist, art. He has resumed minimal yard work which he is enthused about. He denies psychosocial barriers to self health managment or participation in cardiac rehab. No interventions needed at this time.      Barriers   Psychosocial barriers to participate in program There are no identifiable barriers or psychosocial needs.      Screening Interventions   Interventions Encouraged to exercise           Quality of Life Scores:  Quality of Life - 03/13/20 1033      Quality of Life   Select Quality of Life      Quality of Life Scores   Health/Function Pre 27.17 %    Socioeconomic Pre 26.29 %    Psych/Spiritual Pre 28.57 %    Family Pre 27.1 %    GLOBAL Pre 27.26 %          Scores of 19 and below usually indicate a poorer quality of life in these areas.  A difference of  2-3 points is a clinically meaningful difference.  A difference of 2-3 points in the total score of the Quality of Life Index has been associated with significant improvement in overall quality of life, self-image, physical symptoms, and general health in studies assessing change in quality of life.  PHQ-9: Recent Review Flowsheet Data    Depression screen [facility] 2/9 03/13/2020   Decreased Interest 0   Down, Depressed, Hopeless 0   PHQ - 2 Score 0     Interpretation of Total Score  Total Score Depression Severity:  1-4 = Minimal depression, 5-9 = Mild depression, 10-14 = Moderate depression, 15-19 = Moderately severe depression, 20-27 = Severe depression   Psychosocial Evaluation and Intervention:   Psychosocial Re-Evaluation:  Psychosocial Re-Evaluation    [place] Name 03/22/20 1201             Psychosocial Re-Evaluation   Current issues with None Identified       Interventions Encouraged  to attend Cardiac Rehabilitation for the exercise       Continue Psychosocial Services  No Follow up required              Psychosocial Discharge (Final Psychosocial Re-Evaluation):  Psychosocial Re-Evaluation -  03/22/20 1201      Psychosocial Re-Evaluation   Current issues with None Identified    Interventions Encouraged to attend Cardiac Rehabilitation for the exercise    Continue Psychosocial Services  No Follow up required           Vocational Rehabilitation: Provide vocational rehab assistance to qualifying candidates.   Vocational Rehab Evaluation & Intervention:  Vocational Rehab - 03/13/20 1100      Initial Vocational Rehab Evaluation & Intervention   Assessment shows need for Vocational Rehabilitation No           Education: Education Goals: Education classes will be provided on a weekly basis, covering required topics. Participant will state understanding/return demonstration of topics presented.  Learning Barriers/Preferences:  Learning Barriers/Preferences - 03/13/20 1024      Learning Barriers/Preferences   Learning Barriers Sight;Hearing   wears glasses; bilateral hearing aids   Learning Preferences Written Material;Pictoral;Computer/Internet           Education Topics: Hypertension, Hypertension Reduction -Define heart disease and high blood pressure. Discus how high blood pressure affects the body and ways to reduce high blood pressure.   Exercise and Your Heart -Discuss why it is important to exercise, the FITT principles of exercise, normal and abnormal responses to exercise, and how to exercise safely.   Angina -Discuss definition of angina, causes of angina, treatment of angina, and how to decrease risk of having angina.   Cardiac Medications -Review what the following cardiac medications are used for, how they affect the body, and side effects that may occur when taking the medications.  Medications include Aspirin, Beta blockers, calcium channel blockers, ACE Inhibitors, angiotensin receptor blockers, diuretics, digoxin, and antihyperlipidemics.   Congestive Heart Failure -Discuss the definition of CHF, how to live with CHF, the signs and symptoms  of CHF, and how keep track of weight and sodium intake.   Heart Disease and Intimacy -Discus the effect sexual activity has on the heart, how changes occur during intimacy as we age, and safety during sexual activity.   Smoking Cessation / COPD -Discuss different methods to quit smoking, the health benefits of quitting smoking, and the definition of COPD.   Nutrition I: Fats -Discuss the types of cholesterol, what cholesterol does to the heart, and how cholesterol levels can be controlled.   Nutrition II: Labels -Discuss the different components of food labels and how to read food label   Heart Parts/Heart Disease and PAD -Discuss the anatomy of the heart, the pathway of blood circulation through the heart, and these are affected by heart disease.   Stress I: Signs and Symptoms -Discuss the causes of stress, how stress may lead to anxiety and depression, and ways to limit stress.   Stress II: Relaxation -Discuss different types of relaxation techniques to limit stress.   Warning Signs of Stroke / TIA -Discuss definition of a stroke, what the signs and symptoms are of a stroke, and how to identify when someone is having stroke.   Knowledge Questionnaire Score:  Knowledge Questionnaire Score - 03/13/20 1033      Knowledge Questionnaire Score   Pre Score 23/24           Core Components/Risk Factors/Patient Goals at Admission:  Personal Goals  and Risk Factors at Admission - 03/13/20 1057      Core Components/Risk Factors/Patient Goals on Admission    Weight Management Weight Maintenance    Diabetes Yes    Intervention Provide education about signs/symptoms and action to take for hypo/hyperglycemia.;Provide education about proper nutrition, including hydration, and aerobic/resistive exercise prescription along with prescribed medications to achieve blood glucose in normal ranges: Fasting glucose 65-99 mg/dL    Expected Outcomes Short Term: Participant verbalizes  understanding of the signs/symptoms and immediate care of hyper/hypoglycemia, proper foot care and importance of medication, aerobic/resistive exercise and nutrition plan for blood glucose control.;Long Term: Attainment of HbA1C < 7%.    Hypertension Yes    Intervention Provide education on lifestyle modifcations including regular physical activity/exercise, weight management, moderate sodium restriction and increased consumption of fresh fruit, vegetables, and low fat dairy, alcohol moderation, and smoking cessation.;Monitor prescription use compliance.    Expected Outcomes Short Term: Continued assessment and intervention until BP is < 140/26mm HG in hypertensive participants. < 130/45mm HG in hypertensive participants with diabetes, heart failure or chronic kidney disease.;Long Term: Maintenance of blood pressure at goal levels.    Lipids Yes    Intervention Provide education and support for participant on nutrition & aerobic/resistive exercise along with prescribed medications to achieve LDL 70mg , HDL >40mg .    Expected Outcomes Short Term: Participant states understanding of desired cholesterol values and is compliant with medications prescribed. Participant is following exercise prescription and nutrition guidelines.;Long Term: Cholesterol controlled with medications as prescribed, with individualized exercise RX and with personalized nutrition plan. Value goals: LDL < 70mg , HDL > 40 mg.           Core Components/Risk Factors/Patient Goals Review:   Goals and Risk Factor Review    Row Name 03/22/20 1202             Core Components/Risk Factors/Patient Goals Review   Personal Goals Review Weight Management/Obesity;Lipids;Diabetes;Hypertension       Review Carvell is off to a good start to exercise. Pearley's vital signs and CBG's have been stable       Expected Outcomes Octavian will continue to participate in phase 2 cardiac rehab for exercise, nutrition and life style modifications               Core Components/Risk Factors/Patient Goals at Discharge (Final Review):   Goals and Risk Factor Review - 03/22/20 1202      Core Components/Risk Factors/Patient Goals Review   Personal Goals Review Weight Management/Obesity;Lipids;Diabetes;Hypertension    Review Cara is off to a good start to exercise. Mays's vital signs and CBG's have been stable    Expected Outcomes Quinterrius will continue to participate in phase 2 cardiac rehab for exercise, nutrition and life style modifications           ITP Comments:  ITP Comments    Row Name 03/13/20 0913 03/22/20 1157         ITP Comments Dr. Fransico Him, Medical Director Cardiac Rehab [place] 30 Day ITP Review. Takuma is off to a good start to exercise this week.             Comments: See ITP comments.Barnet Pall, RN,BSN 03/27/2020 1:55 PM

## 2020-03-26 ENCOUNTER — Other Ambulatory Visit: Payer: Self-pay

## 2020-03-26 ENCOUNTER — Encounter (HOSPITAL_COMMUNITY)
Admission: RE | Admit: 2020-03-26 | Discharge: 2020-03-26 | Disposition: A | Discharge status: Home / Self Care | Payer: Medicare Other | Source: Ambulatory Visit | Attending: Cardiology | Admitting: Cardiology

## 2020-03-26 DIAGNOSIS — Z951 Presence of aortocoronary bypass graft: Secondary | ICD-10-CM | POA: Diagnosis not present

## 2020-03-26 NOTE — Progress Notes (Addendum)
Steven Lawrence 70 y.o. male Nutrition Note  Visit Diagnosis: S/P CABG x 5  Past Medical History:  Diagnosis Date  . Acid reflux   . Arthritis   . BPH (benign prostatic hyperplasia)    mild  . CAD in native artery    a. NSTEMI 12/2019 s/p CABGx5 with bovine AVR.  Marland Kitchen Depression   . 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 History of pneumonia as a child   . Hypercholesteremia   . Hypothyroidism   . RLS (restless legs syndrome)   . S/P aortic valve replacement with bioprosthetic valve   . S/P CABG (coronary artery bypass graft)   . Sleep apnea    does not use a cpap,primarily with upper airway resistance syndrome AHI 2.35/hr, RDI 10.2/hr ([name])  . Urinary frequency   . Wears glasses   . Wears hearing aid    both ears     Medications reviewed.  Pt knows diabetes medication regimen for diabetes self management. Lipitor noted.   Current Outpatient Medications:  .  aspirin EC 81 MG EC tablet, Take 1 tablet (81 mg total) by mouth daily. Swallow whole., Disp: 30 tablet, Rfl: 11 .  atorvastatin (LIPITOR) 80 MG tablet, Take 80 mg by mouth daily. , Disp: , Rfl:  .  canagliflozin (INVOKANA) 100 MG TABS tablet, Take 300 mg by mouth daily., Disp: , Rfl:  .  cholestyramine light (PREVALITE) 4 GM/DOSE powder, Take 1 g by mouth daily as needed (For stomach)., Disp: , Rfl:  .  doxazosin (CARDURA) 2 MG tablet, Take 2 mg by mouth every morning. Takes for urinary freq, Disp: , Rfl:  .  LEVEMIR FLEXTOUCH 100 UNIT/ML FlexPen, Inject 20 Units into the skin at bedtime., Disp: , Rfl:  .  levothyroxine (SYNTHROID, LEVOTHROID) 50 MCG tablet, Take 75 mcg by mouth daily before breakfast. , Disp: , Rfl:  .  lisinopril (ZESTRIL) 5 MG tablet, Take 5 mg by mouth daily., Disp: , Rfl:  .  metFORMIN (GLUCOPHAGE) 1000 MG tablet, Take 1,000 mg by mouth 2 (two) times daily with a meal., Disp: , Rfl:  .  ondansetron (ZOFRAN) 8 MG tablet, TAKE 1 TABLET BY MOUTH EVERY 8 HOURS AS NEEDED FOR  NAUSEA OR VOMITING., Disp: 20 tablet, Rfl: 0 .  pramipexole (MIRAPEX) 0.125 MG tablet, Take 0.125 mg by mouth as directed. x4 RLS at Bedtime, Disp: , Rfl:    Ht Readings from Last 1 Encounters:  03/13/20 5' 9.25" (1.759 m)     Wt Readings from Last 3 Encounters:  03/13/20 157 lb 10.1 oz (71.5 kg)  02/28/20 156 lb 6.4 oz (70.9 kg)  02/14/20 153 lb (69.4 kg)     There is no height or weight on file to calculate BMI.   Social History   Tobacco Use  Smoking Status Former Smoker  . Packs/day: 1.00  . Years: 53.00  . Pack years: 53.00  . Quit date: 12/16/2019  . Years since quitting: 0.2  Smokeless Tobacco Never Used     Lab Results  Component Value Date   CHOL 124 12/20/2019   Lab Results  Component Value Date   HDL 49 12/20/2019   Lab Results  Component Value Date   LDLCALC 66 12/20/2019   Lab Results  Component Value Date   TRIG 47 12/20/2019     Lab Results  Component Value Date   HGBA1C 7.1 (H) 12/17/2019     CBG (last 3)  No results  for input(s): GLUCAP in the last 72 hours.   Nutrition Note  Spoke with pt. Nutrition Plan and Nutrition Survey goals reviewed with pt.  Pt has room for diet improvement.  Per survey and pt report, he eats red meat and fried foods often. He does focus on getting greens and fruits daily. He typically chooses refined grains.   Pt has Type 2 Diabetes. Last A1c indicates blood glucose well-controlled. Diagnosed in 1995. He had diabetes education in 1995. No reported hypoglycemia. Pt checks CBG's 1-2 times a day. Fasting CBG's reportedly 100-130 mg/dL.  Reviewed cholesterol. WNL. Pt checks BP at home - WNL. Pt has a general knowledge of how his diet affects heart health and diabetes. He was able to explain the impact of fats on cholesterol and carbs on diabetes.  He limits sodium by reducing salt shaker use. He eats out ~4 times per week. He tries to buy low sodium canned foods and/or limits canned foods.   Both he and his wife  cook and grocery shop.  He is interested in reducing CBGs by modifying diet in hopes of reducing diabetes medications. He states Invokana price is high and would like to talk with MD about changing this or coming off at next appointment.   Pt expressed understanding of the information reviewed.   Nutrition Diagnosis ? Excessive carbohydrate intake related to food preferences as evidenced by diet recall of  Large porions of starch at dinner and sandwich with fries and pie for lunches.   Nutrition Intervention ? Pt's individual nutrition plan reviewed with pt. ? Carbohydrate modified diet  ? Continue client-centered nutrition education by RD, as part of interdisciplinary care.  Goal(s) ? Pt to build a healthy plate including vegetables, fruits, whole grains, and low-fat dairy products in a heart healthy meal plan. ? Pt to keep foodlog to determine carbs per meal  ? Pt to learn to modify carb intake at meals and snacks Plan:   Will provide client-centered nutrition education as part of interdisciplinary care  Monitor and evaluate progress toward nutrition goal with team.   Michaele Offer, MS, RDN, LDN

## 2020-03-28 ENCOUNTER — Other Ambulatory Visit: Payer: Self-pay

## 2020-03-28 ENCOUNTER — Encounter (HOSPITAL_COMMUNITY)
Admission: RE | Admit: 2020-03-28 | Discharge: 2020-03-28 | Disposition: A | Discharge status: Home / Self Care | Payer: Medicare Other | Source: Ambulatory Visit | Attending: Cardiology | Admitting: Cardiology

## 2020-03-28 DIAGNOSIS — Z951 Presence of aortocoronary bypass graft: Secondary | ICD-10-CM

## 2020-03-30 ENCOUNTER — Encounter (HOSPITAL_COMMUNITY)
Admission: RE | Admit: 2020-03-30 | Discharge: 2020-03-30 | Disposition: A | Discharge status: Home / Self Care | Payer: Medicare Other | Source: Ambulatory Visit | Attending: Cardiology | Admitting: Cardiology

## 2020-03-30 ENCOUNTER — Other Ambulatory Visit: Payer: Self-pay

## 2020-03-30 DIAGNOSIS — Z951 Presence of aortocoronary bypass graft: Secondary | ICD-10-CM

## 2020-04-02 ENCOUNTER — Encounter (HOSPITAL_COMMUNITY): Payer: Medicare Other

## 2020-04-04 ENCOUNTER — Encounter (HOSPITAL_COMMUNITY)
Admission: RE | Admit: 2020-04-04 | Discharge: 2020-04-04 | Disposition: A | Discharge status: Home / Self Care | Payer: Medicare Other | Source: Ambulatory Visit | Attending: Cardiology | Admitting: Cardiology

## 2020-04-04 ENCOUNTER — Other Ambulatory Visit: Payer: Self-pay

## 2020-04-04 DIAGNOSIS — Z951 Presence of aortocoronary bypass graft: Secondary | ICD-10-CM | POA: Diagnosis not present

## 2020-04-04 NOTE — Progress Notes (Signed)
Nutrition Note - Follow up  Reviewed food log. Fasting CBGs 80-132 mg/dl. Pt eats balanced meals with a variety of protein/carb/fat sources. We reviewed ways to reduce refined carbohydrates and incorporate fiber rich carbs.  Reinforced reduced carb plan. Provided education on diabetes meal planning with plate method. Reinforced with handout. Pt verbalizes understanding.    Michaele Offer, MS, RDN, LDN

## 2020-04-06 ENCOUNTER — Encounter (HOSPITAL_COMMUNITY)
Admission: RE | Admit: 2020-04-06 | Discharge: 2020-04-06 | Disposition: A | Discharge status: Home / Self Care | Payer: Medicare Other | Source: Ambulatory Visit | Attending: Cardiology | Admitting: Cardiology

## 2020-04-06 ENCOUNTER — Other Ambulatory Visit: Payer: Self-pay

## 2020-04-06 DIAGNOSIS — Z951 Presence of aortocoronary bypass graft: Secondary | ICD-10-CM | POA: Diagnosis not present

## 2020-04-09 ENCOUNTER — Encounter (HOSPITAL_COMMUNITY)
Admission: RE | Admit: 2020-04-09 | Discharge: 2020-04-09 | Disposition: A | Discharge status: Home / Self Care | Payer: Medicare Other | Source: Ambulatory Visit | Attending: Cardiology | Admitting: Cardiology

## 2020-04-09 ENCOUNTER — Other Ambulatory Visit: Payer: Self-pay

## 2020-04-09 DIAGNOSIS — Z951 Presence of aortocoronary bypass graft: Secondary | ICD-10-CM

## 2020-04-10 DIAGNOSIS — E785 Hyperlipidemia, unspecified: Secondary | ICD-10-CM | POA: Diagnosis not present

## 2020-04-10 DIAGNOSIS — Z951 Presence of aortocoronary bypass graft: Secondary | ICD-10-CM | POA: Diagnosis not present

## 2020-04-10 LAB — HEPATIC FUNCTION PANEL
ALT: 13 IU/L (ref 0–44)
AST: 18 IU/L (ref 0–40)
Albumin: 4.2 g/dL (ref 3.8–4.8)
Alkaline Phosphatase: 77 IU/L (ref 44–121)
Bilirubin Total: 0.2 mg/dL (ref 0.0–1.2)
Bilirubin, Direct: <0.1 mg/dL (ref 0.00–0.40)
Total Protein: 6.6 g/dL (ref 6.0–8.5)

## 2020-04-10 LAB — LIPID PANEL
Chol/HDL Ratio: 2.9 ratio (ref 0.0–5.0)
Cholesterol, Total: 135 mg/dL (ref 100–199)
HDL: 47 mg/dL (ref >39)
LDL Chol Calc (NIH): 75 mg/dL (ref 0–99)
Triglycerides: 63 mg/dL (ref 0–149)
VLDL Cholesterol Cal: 13 mg/dL (ref 5–40)

## 2020-04-10 NOTE — Progress Notes (Signed)
Reviewed home exercise Rx today. Pt walks or uses his exercise bike at home. We discussed exercise at home 2-3 x/week for 30-45 minutes. Encouraged warm-up, cool-down, and stretching. Reviewed the THRR of 60-121 and keeping the RPE between 11-13 during exercise. Encouraged hydration before, during and after activity. Discussed weather parameters for temperature and humidity for safe exercise outdoors. S/S to terminate exercise reviewed as well and when to call MD vs 911. Pt verbalized understanding of the home exercise Rx and was provided a copy.  Lesly Rubenstein MS, ACSM-EP-C, CCRP

## 2020-04-11 ENCOUNTER — Other Ambulatory Visit: Payer: Self-pay

## 2020-04-11 ENCOUNTER — Telehealth: Payer: Self-pay | Admitting: Cardiology

## 2020-04-11 ENCOUNTER — Encounter (HOSPITAL_COMMUNITY)
Admission: RE | Admit: 2020-04-11 | Discharge: 2020-04-11 | Disposition: A | Discharge status: Home / Self Care | Payer: Medicare Other | Source: Ambulatory Visit | Attending: Cardiology | Admitting: Cardiology

## 2020-04-11 DIAGNOSIS — Z951 Presence of aortocoronary bypass graft: Secondary | ICD-10-CM | POA: Diagnosis not present

## 2020-04-11 NOTE — Telephone Encounter (Signed)
Conni Elliot with [facility] called and requested to speak with Dr. Stanford Breed or his RN. She states she will send a message in epic and is also sending a fax to our [facility] office.

## 2020-04-11 NOTE — Progress Notes (Signed)
Patient noted to have intermittent PAC's and exceeding his target heart rate by as much as 10 beats with exercise at cardiac rehab. Medications reviewed. Steven Lawrence is not taking a beta blocker at this time. Patient is in Sinus Rhythm. Steven Lawrence admits to drinking two cups of coffee before coming to cardiac rehab. Patient asymptomatic and says he will cut back on his caffeine intake. Will send ECG tracings and exercise flow sheets to Dr Jacalyn Lefevre office for review. Resting heart rates are from the upper 70's to the 90's. Resting BP's have been within normal limits.Barnet Pall, RN,BSN 04/11/2020 10:42 AM

## 2020-04-11 NOTE — Telephone Encounter (Signed)
  Good afternoon Dr Stanford Breed,   I want to bring it to your attention that Steven Lawrence was noted to have intermittent PAC's and exceeding his target heart rate by as much as 10 beats with exercise at cardiac rehab. Medications reviewed. Steven Lawrence is not taking a beta blocker at this time. Patient is in Sinus Rhythm. Steven Lawrence admits to drinking two cups of coffee before coming to cardiac rehab. Patient asymptomatic and says he will cut back on his caffeine intake.   I wanted to bring this to your attention as Steven Lawrence has a history of post of Afib.   I faxed Steven Lawrence's ECG tracings to the [facility] office for your review. Steven Lawrence's vital signs have been stable.   Thanks for your input!   Sincerely,  Barnet Pall  [facility]

## 2020-04-12 NOTE — Telephone Encounter (Signed)
Will review strips when available; no change in meds [name]

## 2020-04-13 ENCOUNTER — Encounter (HOSPITAL_COMMUNITY)
Admission: RE | Admit: 2020-04-13 | Discharge: 2020-04-13 | Disposition: A | Discharge status: Home / Self Care | Payer: Medicare Other | Source: Ambulatory Visit | Attending: Cardiology | Admitting: Cardiology

## 2020-04-13 ENCOUNTER — Encounter: Payer: Self-pay | Admitting: *Deleted

## 2020-04-13 ENCOUNTER — Other Ambulatory Visit: Payer: Self-pay

## 2020-04-13 DIAGNOSIS — Z951 Presence of aortocoronary bypass graft: Secondary | ICD-10-CM | POA: Diagnosis not present

## 2020-04-13 NOTE — Telephone Encounter (Signed)
Strips received and will show dr Stanford Breed when he returns to the office 04-23-20.

## 2020-04-16 ENCOUNTER — Encounter (HOSPITAL_COMMUNITY): Payer: Medicare Other

## 2020-04-18 ENCOUNTER — Encounter (HOSPITAL_COMMUNITY)
Admission: RE | Admit: 2020-04-18 | Discharge: 2020-04-18 | Disposition: A | Discharge status: Home / Self Care | Payer: Medicare Other | Source: Ambulatory Visit | Attending: Cardiology | Admitting: Cardiology

## 2020-04-18 ENCOUNTER — Other Ambulatory Visit: Payer: Self-pay

## 2020-04-18 DIAGNOSIS — Z87891 Personal history of nicotine dependence: Secondary | ICD-10-CM | POA: Insufficient documentation

## 2020-04-18 DIAGNOSIS — Z953 Presence of xenogenic heart valve: Secondary | ICD-10-CM | POA: Diagnosis not present

## 2020-04-18 DIAGNOSIS — Z79890 Hormone replacement therapy (postmenopausal): Secondary | ICD-10-CM | POA: Diagnosis not present

## 2020-04-18 DIAGNOSIS — Z7984 Long term (current) use of oral hypoglycemic drugs: Secondary | ICD-10-CM | POA: Diagnosis not present

## 2020-04-18 DIAGNOSIS — Z951 Presence of aortocoronary bypass graft: Secondary | ICD-10-CM | POA: Diagnosis not present

## 2020-04-18 DIAGNOSIS — I252 Old myocardial infarction: Secondary | ICD-10-CM | POA: Diagnosis not present

## 2020-04-18 DIAGNOSIS — Z7982 Long term (current) use of aspirin: Secondary | ICD-10-CM | POA: Diagnosis not present

## 2020-04-18 DIAGNOSIS — Z79899 Other long term (current) drug therapy: Secondary | ICD-10-CM | POA: Diagnosis not present

## 2020-04-20 ENCOUNTER — Other Ambulatory Visit: Payer: Self-pay

## 2020-04-20 ENCOUNTER — Encounter (HOSPITAL_COMMUNITY)
Admission: RE | Admit: 2020-04-20 | Discharge: 2020-04-20 | Disposition: A | Discharge status: Home / Self Care | Payer: Medicare Other | Source: Ambulatory Visit | Attending: Cardiology | Admitting: Cardiology

## 2020-04-20 DIAGNOSIS — Z79890 Hormone replacement therapy (postmenopausal): Secondary | ICD-10-CM | POA: Diagnosis not present

## 2020-04-20 DIAGNOSIS — I252 Old myocardial infarction: Secondary | ICD-10-CM | POA: Diagnosis not present

## 2020-04-20 DIAGNOSIS — Z951 Presence of aortocoronary bypass graft: Secondary | ICD-10-CM

## 2020-04-20 DIAGNOSIS — Z7982 Long term (current) use of aspirin: Secondary | ICD-10-CM | POA: Diagnosis not present

## 2020-04-20 DIAGNOSIS — Z953 Presence of xenogenic heart valve: Secondary | ICD-10-CM | POA: Diagnosis not present

## 2020-04-20 DIAGNOSIS — Z79899 Other long term (current) drug therapy: Secondary | ICD-10-CM | POA: Diagnosis not present

## 2020-04-23 ENCOUNTER — Other Ambulatory Visit: Payer: Self-pay

## 2020-04-23 ENCOUNTER — Encounter (HOSPITAL_COMMUNITY)
Admission: RE | Admit: 2020-04-23 | Discharge: 2020-04-23 | Disposition: A | Discharge status: Home / Self Care | Payer: Medicare Other | Source: Ambulatory Visit | Attending: Cardiology | Admitting: Cardiology

## 2020-04-23 DIAGNOSIS — Z79890 Hormone replacement therapy (postmenopausal): Secondary | ICD-10-CM | POA: Diagnosis not present

## 2020-04-23 DIAGNOSIS — Z951 Presence of aortocoronary bypass graft: Secondary | ICD-10-CM | POA: Diagnosis not present

## 2020-04-23 DIAGNOSIS — I252 Old myocardial infarction: Secondary | ICD-10-CM | POA: Diagnosis not present

## 2020-04-23 DIAGNOSIS — Z79899 Other long term (current) drug therapy: Secondary | ICD-10-CM | POA: Diagnosis not present

## 2020-04-23 DIAGNOSIS — Z953 Presence of xenogenic heart valve: Secondary | ICD-10-CM | POA: Diagnosis not present

## 2020-04-23 DIAGNOSIS — Z7982 Long term (current) use of aspirin: Secondary | ICD-10-CM | POA: Diagnosis not present

## 2020-04-23 NOTE — Progress Notes (Signed)
Cardiac Individual Treatment Plan  Patient Details  Name: Steven Lawrence MRN: [id number] Date of Birth: Jun 05, 1950 Referring Provider:   Flowsheet Row CARDIAC REHAB PHASE II ORIENTATION from 03/13/2020 in [facility]  Referring Provider Denice Bors. Stanford Breed, MD      Initial Encounter Date:  Flowsheet Row CARDIAC REHAB PHASE II ORIENTATION from 03/13/2020 in [place]  Date 03/13/20      Visit Diagnosis: S/P CABG x 5  Patient's Home Medications on Admission:  Current Outpatient Medications:  .  aspirin EC 81 MG EC tablet, Take 1 tablet (81 mg total) by mouth daily. Swallow whole., Disp: 30 tablet, Rfl: 11 .  atorvastatin (LIPITOR) 80 MG tablet, Take 80 mg by mouth daily. , Disp: , Rfl:  .  canagliflozin (INVOKANA) 100 MG TABS tablet, Take 300 mg by mouth daily., Disp: , Rfl:  .  cholestyramine light (PREVALITE) 4 GM/DOSE powder, Take 1 g by mouth daily as needed (For stomach)., Disp: , Rfl:  .  doxazosin (CARDURA) 2 MG tablet, Take 2 mg by mouth every morning. Takes for urinary freq, Disp: , Rfl:  .  LEVEMIR FLEXTOUCH 100 UNIT/ML FlexPen, Inject 20 Units into the skin at bedtime., Disp: , Rfl:  .  levothyroxine (SYNTHROID, LEVOTHROID) 50 MCG tablet, Take 75 mcg by mouth daily before breakfast. , Disp: , Rfl:  .  lisinopril (ZESTRIL) 5 MG tablet, Take 5 mg by mouth daily., Disp: , Rfl:  .  metFORMIN (GLUCOPHAGE) 1000 MG tablet, Take 1,000 mg by mouth 2 (two) times daily with a meal., Disp: , Rfl:  .  ondansetron (ZOFRAN) 8 MG tablet, TAKE 1 TABLET BY MOUTH EVERY 8 HOURS AS NEEDED FOR NAUSEA OR VOMITING. (Patient not taking: Reported on 04/11/2020), Disp: 20 tablet, Rfl: 0 .  pramipexole (MIRAPEX) 0.125 MG tablet, Take 0.125 mg by mouth as directed. x4 RLS at Bedtime, Disp: , Rfl:   Past Medical History: Past Medical History:  Diagnosis Date  . Acid reflux   . Arthritis   . BPH (benign prostatic hyperplasia)    mild  . CAD  in native artery    a. NSTEMI 12/2019 s/p CABGx5 with bovine AVR.  Marland Kitchen Depression   . 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 History of pneumonia as a child   . Hypercholesteremia   . Hypothyroidism   . RLS (restless legs syndrome)   . S/P aortic valve replacement with bioprosthetic valve   . S/P CABG (coronary artery bypass graft)   . Sleep apnea    does not use a cpap,primarily with upper airway resistance syndrome AHI 2.35/hr, RDI 10.2/hr ([name])  . Urinary frequency   . Wears glasses   . Wears hearing aid    both ears    Tobacco Use: Social History   Tobacco Use  Smoking Status Former Smoker  . Packs/day: 1.00  . Years: 53.00  . Pack years: 53.00  . Quit date: 12/16/2019  . Years since quitting: 0.3  Smokeless Tobacco Never Used    Labs: Recent Review Flowsheet Data    Labs for ITP Cardiac and Pulmonary Rehab Latest Ref Rng & Units 12/21/2019 12/21/2019 12/21/2019 12/21/2019 04/10/2020   Cholestrol 100 - 199 mg/dL - - - - 135   LDLCALC 0 - 99 mg/dL - - - - 75   HDL >39 mg/dL - - - - 47   Trlycerides 0 - 149 mg/dL - - - -  63   Hemoglobin A1c 4.8 - 5.6 % - - - - -   PHART 7.350 - 7.450 - 7.377 7.404 7.357 -   PCO2ART 32.0 - 48.0 mmHg - 37.9 33.2 37.2 -   HCO3 20.0 - 28.0 mmol/L - 22.6 20.8 20.8 -   TCO2 22 - 32 mmol/L 23 24 22 22  -   ACIDBASEDEF 0.0 - 2.0 mmol/L - 3.0(H) 3.0(H) 4.0(H) -   O2SAT % - 99.0 99.0 97.0 -      Capillary Blood Glucose: Lab Results  Component Value Date   GLUCAP 113 (H) 03/21/2020   GLUCAP 138 (H) 03/19/2020   GLUCAP 171 (H) 03/19/2020   GLUCAP 141 (H) 12/26/2019   GLUCAP 205 (H) 12/25/2019    POCT Glucose    Row Name 03/13/20 0929             POCT Blood Glucose   Pre-Exercise 144 mg/dL  patient reported fasting am blood sugar              Exercise Target Goals: Exercise Program Goal: Individual exercise prescription set using results from initial 6 min walk test and THRR while considering   patient's activity barriers and safety.   Exercise Prescription Goal: Starting with aerobic activity 30 plus minutes a day, 3 days per week for initial exercise prescription. Provide home exercise prescription and guidelines that participant acknowledges understanding prior to discharge.  Activity Barriers & Risk Stratification:  Activity Barriers & Cardiac Risk Stratification - 03/13/20 1020      Activity Barriers & Cardiac Risk Stratification   Activity Barriers Arthritis;Neck/Spine Problems;Joint Problems;Balance Concerns    Cardiac Risk Stratification High           6 Minute Walk:  6 Minute Walk    Row Name 03/13/20 0945         6 Minute Walk   Phase Initial     Distance 1861 feet     Walk Time 6 minutes     # of Rest Breaks 0     MPH 3.52     METS 4.56     RPE 13     Perceived Dyspnea  0     VO2 Peak 15.98     Symptoms Yes (comment)     Comments Bilateral chronic knee pain 4/10     Resting HR 80 bpm     Resting BP 122/60     Resting Oxygen Saturation  96 %     Exercise Oxygen Saturation  during 6 min walk 97 %     Max Ex. HR 123 bpm     Max Ex. BP 142/70     2 Minute Post BP 120/66            Oxygen Initial Assessment:   Oxygen Re-Evaluation:   Oxygen Discharge (Final Oxygen Re-Evaluation):   Initial Exercise Prescription:  Initial Exercise Prescription - 03/13/20 1000      Date of Initial Exercise RX and Referring Provider   Date 03/13/20    Referring Provider Denice Bors. Stanford Breed, MD    Expected Discharge Date 05/11/20      T5 Nustep   Level 3    SPM 85    Minutes 15    METs 2.4      Track   Laps 12    Minutes 15    METs 2.39      Prescription Details   Frequency (times per week) 3    Duration Progress to 30 minutes  of continuous aerobic without signs/symptoms of physical distress      Intensity   THRR 40-80% of Max Heartrate 60-121    Ratings of Perceived Exertion 11-13    Perceived Dyspnea 0-4      Progression   Progression  Continue progressive overload as per policy without signs/symptoms or physical distress.      Resistance Training   Training Prescription Yes    Weight 4 lbs    Reps 10-15           Perform Capillary Blood Glucose checks as needed.  Exercise Prescription Changes:   Exercise Prescription Changes    Row Name 03/19/20 1000 04/09/20 0950 04/23/20 1000         Response to Exercise   Blood Pressure (Admit) 118/54 122/63 120/62     Blood Pressure (Exercise) 146/72 160/62 148/70     Blood Pressure (Exit) 110/68 98/60 104/52     Heart Rate (Admit) 88 bpm 88 bpm 88 bpm     Heart Rate (Exercise) 109 bpm 126 bpm 120 bpm     Heart Rate (Exit) 78 bpm 76 bpm 96 bpm     Rating of Perceived Exertion (Exercise) 13 11 11      Symptoms None None None     Comments Pt's first day of exercise in the CRP2 program Reviewed Home exercise Rx and METS Reviewed Goals     Duration Progress to 30 minutes of  aerobic without signs/symptoms of physical distress Continue with 30 min of aerobic exercise without signs/symptoms of physical distress. Continue with 30 min of aerobic exercise without signs/symptoms of physical distress.     Intensity THRR unchanged THRR unchanged THRR unchanged           Progression   Progression Continue to progress workloads to maintain intensity without signs/symptoms of physical distress. Continue to progress workloads to maintain intensity without signs/symptoms of physical distress. Continue to progress workloads to maintain intensity without signs/symptoms of physical distress.     Average METs 2.65 3.2 3.4           Resistance Training   Training Prescription Yes Yes Yes     Weight 4 lbs 5 lbs 6 lbs     Reps 10-15 10-15 10-15     Time 10 Minutes 10 Minutes 10 Minutes           Interval Training   Interval Training No No No           T5 Nustep   Level 3 4 5      SPM 90 100 100     Minutes 15 15 15      METs 2.1 3.1 3.3           Track   Laps 19 20 22       Minutes 15 15 15      METs 3.21 3.32 3.55           Home Exercise Plan   Plans to continue exercise at -- Home (comment) Home (comment)     Frequency -- Add 2 additional days to program exercise sessions. Add 2 additional days to program exercise sessions.     Initial Home Exercises Provided -- 04/09/20 04/09/20            Exercise Comments:   Exercise Comments    Row Name 03/19/20 1012 04/09/20 1015 04/23/20 1030       Exercise Comments Pt's first day of exercise in the [facility] program. Pt tolerated session well with  no complaints. Reviewed home exercise Rx with patient. Pt verbalized understanding of the home exercise Rx and was provided a copy. Reviewed goals with pt. Pt expresses that hs is making good progress, exercising 6-7x/week and feeling stronger.            Exercise Goals and Review:   Exercise Goals    Row Name 03/13/20 1021             Exercise Goals   Increase Physical Activity Yes       Intervention Provide advice, education, support and counseling about physical activity/exercise needs.;Develop an individualized exercise prescription for aerobic and resistive training based on initial evaluation findings, risk stratification, comorbidities and participant's personal goals.       Expected Outcomes Short Term: Attend rehab on a regular basis to increase amount of physical activity.;Long Term: Add in home exercise to make exercise part of routine and to increase amount of physical activity.;Long Term: Exercising regularly at least 3-5 days a week.       Increase Strength and Stamina Yes       Intervention Provide advice, education, support and counseling about physical activity/exercise needs.;Develop an individualized exercise prescription for aerobic and resistive training based on initial evaluation findings, risk stratification, comorbidities and participant's personal goals.       Expected Outcomes Short Term: Increase workloads from initial exercise  prescription for resistance, speed, and METs.;Short Term: Perform resistance training exercises routinely during rehab and add in resistance training at home;Long Term: Improve cardiorespiratory fitness, muscular endurance and strength as measured by increased METs and functional capacity (6MWT)       Able to understand and use rate of perceived exertion (RPE) scale Yes       Intervention Provide education and explanation on how to use RPE scale       Expected Outcomes Short Term: Able to use RPE daily in rehab to express subjective intensity level;Long Term:  Able to use RPE to guide intensity level when exercising independently       Knowledge and understanding of Target Heart Rate Range (THRR) Yes       Intervention Provide education and explanation of THRR including how the numbers were predicted and where they are located for reference       Expected Outcomes Short Term: Able to state/look up THRR;Short Term: Able to use daily as guideline for intensity in rehab;Long Term: Able to use THRR to govern intensity when exercising independently       Understanding of Exercise Prescription Yes       Intervention Provide education, explanation, and written materials on patient's individual exercise prescription       Expected Outcomes Short Term: Able to explain program exercise prescription;Long Term: Able to explain home exercise prescription to exercise independently              Exercise Goals Re-Evaluation :  Exercise Goals Re-Evaluation    [place] Name 03/19/20 1009 04/09/20 1015 04/23/20 1028         Exercise Goal Re-Evaluation   Exercise Goals Review Increase Physical Activity;Increase Strength and Stamina;Able to understand and use rate of perceived exertion (RPE) scale;Knowledge and understanding of Target Heart Rate Range (THRR);Able to check pulse independently;Understanding of Exercise Prescription Increase Physical Activity;Increase Strength and Stamina;Able to understand and use rate of  perceived exertion (RPE) scale;Knowledge and understanding of Target Heart Rate Range (THRR);Able to check pulse independently;Understanding of Exercise Prescription Increase Physical Activity;Increase Strength and Stamina;Able to understand and use rate  of perceived exertion (RPE) scale;Knowledge and understanding of Target Heart Rate Range (THRR);Able to check pulse independently;Understanding of Exercise Prescription     Comments Pt's first day of exercise in the CRP2 program. Pt understands the RPE scale, THRR, and his exercise Rx. Reviewed home exercise Rx and METs. Pt is making good progress in the CRP2 program. Pt will walk at home or use his exercise bike 2-3x/week for 30-45 minutes to improve his CV fitness. Reviewed goals. Pt voices he feels he is making good progress toward his goals and walks or rides his bike 3-4x/week on his off days from the [place] program.     Expected Outcomes Will continue to monitor patient and progress as tolerated. Pt will exercise at home 2-3x/week for 30-45 minutes. Will continue monitor patient as progress as tolerated.             Discharge Exercise Prescription (Final Exercise Prescription Changes):  Exercise Prescription Changes - 04/23/20 1000      Response to Exercise   Blood Pressure (Admit) 120/62    Blood Pressure (Exercise) 148/70    Blood Pressure (Exit) 104/52    Heart Rate (Admit) 88 bpm    Heart Rate (Exercise) 120 bpm    Heart Rate (Exit) 96 bpm    Rating of Perceived Exertion (Exercise) 11    Symptoms None    Comments Reviewed Goals    Duration Continue with 30 min of aerobic exercise without signs/symptoms of physical distress.    Intensity THRR unchanged      Progression   Progression Continue to progress workloads to maintain intensity without signs/symptoms of physical distress.    Average METs 3.4      Resistance Training   Training Prescription Yes    Weight 6 lbs    Reps 10-15    Time 10 Minutes      Interval Training    Interval Training No      T5 Nustep   Level 5    SPM 100    Minutes 15    METs 3.3      Track   Laps 22    Minutes 15    METs 3.55      Home Exercise Plan   Plans to continue exercise at Home (comment)    Frequency Add 2 additional days to program exercise sessions.    Initial Home Exercises Provided 04/09/20           Nutrition:  Target Goals: Understanding of nutrition guidelines, daily intake of sodium 1500mg , cholesterol 200mg , calories 30% from fat and 7% or less from saturated fats, daily to have 5 or more servings of fruits and vegetables.  Biometrics:  Pre Biometrics - 03/13/20 0918      Pre Biometrics   Height 5' 9.25" (1.759 m)    Waist Circumference 36.5 inches    Hip Circumference 38.5 inches    Waist to Hip Ratio 0.95 %    BMI (Calculated) 23.11    Triceps Skinfold 11 mm    % Body Fat 22.9 %    Grip Strength 30 kg    Flexibility 17.5 in    Single Leg Stand 7.56 seconds   moderate fall risk           Nutrition Therapy Plan and Nutrition Goals:  Nutrition Therapy & Goals - 03/26/20 1123      Personal Nutrition Goals   Nutrition Goal Pt to build a healthy plate including vegetables, fruits, whole grains, and low-fat dairy  products in a heart healthy meal plan.    Personal Goal #2 Pt to keep foodlog to determine carbs per meal    Personal Goal #3 Pt to learn to modify carb intake at meals and snacks      Intervention Plan   Intervention Prescribe, educate and counsel regarding individualized specific dietary modifications aiming towards targeted core components such as weight, hypertension, lipid management, diabetes, heart failure and other comorbidities.;Nutrition handout(s) given to patient.    Expected Outcomes Short Term Goal: A plan has been developed with personal nutrition goals set during dietitian appointment.;Long Term Goal: Adherence to prescribed nutrition plan.           Nutrition Assessments:  MEDIFICTS Score Key:  ?70 Need to  make dietary changes   40-70 Heart Healthy Diet  ? 40 Therapeutic Level Cholesterol Diet  Flowsheet Row CARDIAC REHAB PHASE II EXERCISE from 03/26/2020 in [facility]  Picture Your Plate Total Score on Admission 57     Picture Your Plate Scores:  [id number] Unhealthy dietary pattern with much room for improvement.  41-50 Dietary pattern unlikely to meet recommendations for good health and room for improvement.  51-60 More healthful dietary pattern, with some room for improvement.   >60 Healthy dietary pattern, although there may be some specific behaviors that could be improved.    Nutrition Goals Re-Evaluation:  Nutrition Goals Re-Evaluation    [place] Name 03/26/20 1124 04/23/20 1019           Goals   Current Weight 157 lb (71.2 kg) 156 lb 15.5 oz (71.2 kg)      Nutrition Goal Pt to build a healthy plate including vegetables, fruits, whole grains, and low-fat dairy products in a heart healthy meal plan. Pt to build a healthy plate including vegetables, fruits, whole grains, and low-fat dairy products in a heart healthy meal plan.             Personal Goal #2 Re-Evaluation   Personal Goal #2 Pt to keep foodlog to determine carbs per meal Pt to keep foodlog to determine carbs per meal             Personal Goal #3 Re-Evaluation   Personal Goal #3 Pt to learn to modify carb intake at meals and snacks Pt to learn to modify carb intake at meals and snacks             Nutrition Goals Discharge (Final Nutrition Goals Re-Evaluation):  Nutrition Goals Re-Evaluation - 04/23/20 1019      Goals   Current Weight 156 lb 15.5 oz (71.2 kg)    Nutrition Goal Pt to build a healthy plate including vegetables, fruits, whole grains, and low-fat dairy products in a heart healthy meal plan.      Personal Goal #2 Re-Evaluation   Personal Goal #2 Pt to keep foodlog to determine carbs per meal      Personal Goal #3 Re-Evaluation   Personal Goal #3 Pt to learn to  modify carb intake at meals and snacks           Psychosocial: Target Goals: Acknowledge presence or absence of significant depression and/or stress, maximize coping skills, provide positive support system. Participant is able to verbalize types and ability to use techniques and skills needed for reducing stress and depression.  Initial Review & Psychosocial Screening:  Initial Psych Review & Screening - 03/13/20 [id number]      Initial Review   Current issues with None Identified  Family Dynamics   Good Support System? Yes    Comments Mr. Steinmetz presents to cardiac rehab orientation with a positive attitude and outlook. He admits to a strong support system including his wife, daughter, grandchildren, and friends. He is walking an hour a day outdoors which he enjoys. 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 He has resumed minimal yard work which he is enthused about. He denies psychosocial barriers to self health managment or participation in cardiac rehab. No interventions needed at this time.      Barriers   Psychosocial barriers to participate in program There are no identifiable barriers or psychosocial needs.      Screening Interventions   Interventions Encouraged to exercise           Quality of Life Scores:  Quality of Life - 03/13/20 1033      Quality of Life   Select Quality of Life      Quality of Life Scores   Health/Function Pre 27.17 %    Socioeconomic Pre 26.29 %    Psych/Spiritual Pre 28.57 %    Family Pre 27.1 %    GLOBAL Pre 27.26 %          Scores of 19 and below usually indicate a poorer quality of life in these areas.  A difference of  2-3 points is a clinically meaningful difference.  A difference of 2-3 points in the total score of the Quality of Life Index has been associated with significant improvement in overall quality of life, self-image, physical symptoms, and general health in studies assessing change in quality of life.  PHQ-9: Recent  Review Flowsheet Data    Depression screen [facility] 2/9 03/13/2020   Decreased Interest 0   Down, Depressed, Hopeless 0   PHQ - 2 Score 0     Interpretation of Total Score  Total Score Depression Severity:  1-4 = Minimal depression, 5-9 = Mild depression, 10-14 = Moderate depression, 15-19 = Moderately severe depression, 20-27 = Severe depression   Psychosocial Evaluation and Intervention:   Psychosocial Re-Evaluation:  Psychosocial Re-Evaluation    [place] Name 03/22/20 1201 04/20/20 0946           Psychosocial Re-Evaluation   Current issues with None Identified None Identified      Interventions Encouraged to attend Cardiac Rehabilitation for the exercise Encouraged to attend Cardiac Rehabilitation for the exercise      Continue Psychosocial Services  No Follow up required No Follow up required             Psychosocial Discharge (Final Psychosocial Re-Evaluation):  Psychosocial Re-Evaluation - 04/20/20 0946      Psychosocial Re-Evaluation   Current issues with None Identified    Interventions Encouraged to attend Cardiac Rehabilitation for the exercise    Continue Psychosocial Services  No Follow up required           Vocational Rehabilitation: Provide vocational rehab assistance to qualifying candidates.   Vocational Rehab Evaluation & Intervention:  Vocational Rehab - 03/13/20 1100      Initial Vocational Rehab Evaluation & Intervention   Assessment shows need for Vocational Rehabilitation No           Education: Education Goals: Education classes will be provided on a weekly basis, covering required topics. Participant will state understanding/return demonstration of topics presented.  Learning Barriers/Preferences:  Learning Barriers/Preferences - 03/13/20 1024      Learning Barriers/Preferences   Learning Barriers Sight;Hearing   wears glasses; bilateral hearing  aids   Learning Preferences Written Material;Pictoral;Computer/Internet            Education Topics: Hypertension, Hypertension Reduction -Define heart disease and high blood pressure. Discus how high blood pressure affects the body and ways to reduce high blood pressure.   Exercise and Your Heart -Discuss why it is important to exercise, the FITT principles of exercise, normal and abnormal responses to exercise, and how to exercise safely.   Angina -Discuss definition of angina, causes of angina, treatment of angina, and how to decrease risk of having angina.   Cardiac Medications -Review what the following cardiac medications are used for, how they affect the body, and side effects that may occur when taking the medications.  Medications include Aspirin, Beta blockers, calcium channel blockers, ACE Inhibitors, angiotensin receptor blockers, diuretics, digoxin, and antihyperlipidemics.   Congestive Heart Failure -Discuss the definition of CHF, how to live with CHF, the signs and symptoms of CHF, and how keep track of weight and sodium intake.   Heart Disease and Intimacy -Discus the effect sexual activity has on the heart, how changes occur during intimacy as we age, and safety during sexual activity.   Smoking Cessation / COPD -Discuss different methods to quit smoking, the health benefits of quitting smoking, and the definition of COPD.   Nutrition I: Fats -Discuss the types of cholesterol, what cholesterol does to the heart, and how cholesterol levels can be controlled.   Nutrition II: Labels -Discuss the different components of food labels and how to read food label   Heart Parts/Heart Disease and PAD -Discuss the anatomy of the heart, the pathway of blood circulation through the heart, and these are affected by heart disease.   Stress I: Signs and Symptoms -Discuss the causes of stress, how stress may lead to anxiety and depression, and ways to limit stress.   Stress II: Relaxation -Discuss different types of relaxation techniques to limit  stress.   Warning Signs of Stroke / TIA -Discuss definition of a stroke, what the signs and symptoms are of a stroke, and how to identify when someone is having stroke.   Knowledge Questionnaire Score:  Knowledge Questionnaire Score - 03/13/20 1033      Knowledge Questionnaire Score   Pre Score 23/24           Core Components/Risk Factors/Patient Goals at Admission:  Personal Goals and Risk Factors at Admission - 03/13/20 1057      Core Components/Risk Factors/Patient Goals on Admission    Weight Management Weight Maintenance    Diabetes Yes    Intervention Provide education about signs/symptoms and action to take for hypo/hyperglycemia.;Provide education about proper nutrition, including hydration, and aerobic/resistive exercise prescription along with prescribed medications to achieve blood glucose in normal ranges: Fasting glucose 65-99 mg/dL    Expected Outcomes Short Term: Participant verbalizes understanding of the signs/symptoms and immediate care of hyper/hypoglycemia, proper foot care and importance of medication, aerobic/resistive exercise and nutrition plan for blood glucose control.;Long Term: Attainment of HbA1C < 7%.    Hypertension Yes    Intervention Provide education on lifestyle modifcations including regular physical activity/exercise, weight management, moderate sodium restriction and increased consumption of fresh fruit, vegetables, and low fat dairy, alcohol moderation, and smoking cessation.;Monitor prescription use compliance.    Expected Outcomes Short Term: Continued assessment and intervention until BP is < 140/41mm HG in hypertensive participants. < 130/27mm HG in hypertensive participants with diabetes, heart failure or chronic kidney disease.;Long Term: Maintenance of blood pressure at goal levels.  Lipids Yes    Intervention Provide education and support for participant on nutrition & aerobic/resistive exercise along with prescribed medications to achieve  LDL 70mg , HDL >40mg .    Expected Outcomes Short Term: Participant states understanding of desired cholesterol values and is compliant with medications prescribed. Participant is following exercise prescription and nutrition guidelines.;Long Term: Cholesterol controlled with medications as prescribed, with individualized exercise RX and with personalized nutrition plan. Value goals: LDL < 70mg , HDL > 40 mg.           Core Components/Risk Factors/Patient Goals Review:   Goals and Risk Factor Review    Row Name 03/22/20 1202 04/20/20 0946           Core Components/Risk Factors/Patient Goals Review   Personal Goals Review Weight Management/Obesity;Lipids;Diabetes;Hypertension Weight Management/Obesity;Lipids;Diabetes;Hypertension      Review Argil is off to a good start to exercise. Clayden's vital signs and CBG's have been stable Jyles has been doing good with exercise. Oscar has cut back on his caffeine intake as he was going over his target heart rate. Lorren's has been staying within his target on recent sessions      Expected Outcomes Indy will continue to participate in phase 2 cardiac rehab for exercise, nutrition and life style modifications Auston will continue to participate in phase 2 cardiac rehab for exercise, nutrition and life style modifications             Core Components/Risk Factors/Patient Goals at Discharge (Final Review):   Goals and Risk Factor Review - 04/20/20 0946      Core Components/Risk Factors/Patient Goals Review   Personal Goals Review Weight Management/Obesity;Lipids;Diabetes;Hypertension    Review Baruc has been doing good with exercise. Cherif has cut back on his caffeine intake as he was going over his target heart rate. Hance's has been staying within his target on recent sessions    Expected Outcomes Lamart will continue to participate in phase 2 cardiac rehab for exercise, nutrition and life style modifications           ITP Comments:  ITP Comments    Row Name  03/13/20 0913 03/22/20 1157 04/20/20 0945       ITP Comments Dr. Fransico Him, Medical Director Cardiac Rehab [facility] 30 Day ITP Review. Armari is off to a good start to exercise this week. 30 Day ITP Review. Lamberto has good attendance and participation in phase 2 cardiac rehab            Comments: See ITP comments.Barnet Pall, RN,BSN 04/24/2020 1:02 PM

## 2020-04-25 ENCOUNTER — Other Ambulatory Visit: Payer: Self-pay

## 2020-04-25 ENCOUNTER — Encounter (HOSPITAL_COMMUNITY)
Admission: RE | Admit: 2020-04-25 | Discharge: 2020-04-25 | Disposition: A | Discharge status: Home / Self Care | Payer: Medicare Other | Source: Ambulatory Visit | Attending: Cardiology | Admitting: Cardiology

## 2020-04-25 DIAGNOSIS — Z953 Presence of xenogenic heart valve: Secondary | ICD-10-CM | POA: Diagnosis not present

## 2020-04-25 DIAGNOSIS — Z7982 Long term (current) use of aspirin: Secondary | ICD-10-CM | POA: Diagnosis not present

## 2020-04-25 DIAGNOSIS — Z951 Presence of aortocoronary bypass graft: Secondary | ICD-10-CM | POA: Diagnosis not present

## 2020-04-25 DIAGNOSIS — I252 Old myocardial infarction: Secondary | ICD-10-CM | POA: Diagnosis not present

## 2020-04-25 DIAGNOSIS — Z79899 Other long term (current) drug therapy: Secondary | ICD-10-CM | POA: Diagnosis not present

## 2020-04-25 DIAGNOSIS — Z79890 Hormone replacement therapy (postmenopausal): Secondary | ICD-10-CM | POA: Diagnosis not present

## 2020-04-27 ENCOUNTER — Other Ambulatory Visit: Payer: Self-pay

## 2020-04-27 ENCOUNTER — Encounter (HOSPITAL_COMMUNITY)
Admission: RE | Admit: 2020-04-27 | Discharge: 2020-04-27 | Disposition: A | Discharge status: Home / Self Care | Payer: Medicare Other | Source: Ambulatory Visit | Attending: Cardiology | Admitting: Cardiology

## 2020-04-27 DIAGNOSIS — Z7982 Long term (current) use of aspirin: Secondary | ICD-10-CM | POA: Diagnosis not present

## 2020-04-27 DIAGNOSIS — Z951 Presence of aortocoronary bypass graft: Secondary | ICD-10-CM | POA: Diagnosis not present

## 2020-04-27 DIAGNOSIS — Z79899 Other long term (current) drug therapy: Secondary | ICD-10-CM | POA: Diagnosis not present

## 2020-04-27 DIAGNOSIS — Z953 Presence of xenogenic heart valve: Secondary | ICD-10-CM | POA: Diagnosis not present

## 2020-04-27 DIAGNOSIS — Z79890 Hormone replacement therapy (postmenopausal): Secondary | ICD-10-CM | POA: Diagnosis not present

## 2020-04-27 DIAGNOSIS — I252 Old myocardial infarction: Secondary | ICD-10-CM | POA: Diagnosis not present

## 2020-04-30 ENCOUNTER — Encounter (HOSPITAL_COMMUNITY)
Admission: RE | Admit: 2020-04-30 | Discharge: 2020-04-30 | Disposition: A | Discharge status: Home / Self Care | Payer: Medicare Other | Source: Ambulatory Visit | Attending: Cardiology | Admitting: Cardiology

## 2020-04-30 ENCOUNTER — Other Ambulatory Visit: Payer: Self-pay

## 2020-04-30 DIAGNOSIS — Z951 Presence of aortocoronary bypass graft: Secondary | ICD-10-CM | POA: Diagnosis not present

## 2020-04-30 DIAGNOSIS — Z79899 Other long term (current) drug therapy: Secondary | ICD-10-CM | POA: Diagnosis not present

## 2020-04-30 DIAGNOSIS — Z79890 Hormone replacement therapy (postmenopausal): Secondary | ICD-10-CM | POA: Diagnosis not present

## 2020-04-30 DIAGNOSIS — Z7982 Long term (current) use of aspirin: Secondary | ICD-10-CM | POA: Diagnosis not present

## 2020-04-30 DIAGNOSIS — Z953 Presence of xenogenic heart valve: Secondary | ICD-10-CM | POA: Diagnosis not present

## 2020-04-30 DIAGNOSIS — I252 Old myocardial infarction: Secondary | ICD-10-CM | POA: Diagnosis not present

## 2020-05-02 ENCOUNTER — Other Ambulatory Visit: Payer: Self-pay

## 2020-05-02 ENCOUNTER — Encounter (HOSPITAL_COMMUNITY)
Admission: RE | Admit: 2020-05-02 | Discharge: 2020-05-02 | Disposition: A | Discharge status: Home / Self Care | Payer: Medicare Other | Source: Ambulatory Visit | Attending: Cardiology | Admitting: Cardiology

## 2020-05-02 DIAGNOSIS — Z7982 Long term (current) use of aspirin: Secondary | ICD-10-CM | POA: Diagnosis not present

## 2020-05-02 DIAGNOSIS — Z79890 Hormone replacement therapy (postmenopausal): Secondary | ICD-10-CM | POA: Diagnosis not present

## 2020-05-02 DIAGNOSIS — Z953 Presence of xenogenic heart valve: Secondary | ICD-10-CM | POA: Diagnosis not present

## 2020-05-02 DIAGNOSIS — Z951 Presence of aortocoronary bypass graft: Secondary | ICD-10-CM

## 2020-05-02 DIAGNOSIS — Z79899 Other long term (current) drug therapy: Secondary | ICD-10-CM | POA: Diagnosis not present

## 2020-05-02 DIAGNOSIS — I252 Old myocardial infarction: Secondary | ICD-10-CM | POA: Diagnosis not present

## 2020-05-04 ENCOUNTER — Encounter (HOSPITAL_COMMUNITY)
Admission: RE | Admit: 2020-05-04 | Discharge: 2020-05-04 | Disposition: A | Discharge status: Home / Self Care | Payer: Medicare Other | Source: Ambulatory Visit | Attending: Cardiology | Admitting: Cardiology

## 2020-05-04 ENCOUNTER — Other Ambulatory Visit: Payer: Self-pay

## 2020-05-04 DIAGNOSIS — Z951 Presence of aortocoronary bypass graft: Secondary | ICD-10-CM

## 2020-05-04 DIAGNOSIS — Z7982 Long term (current) use of aspirin: Secondary | ICD-10-CM | POA: Diagnosis not present

## 2020-05-04 DIAGNOSIS — Z79899 Other long term (current) drug therapy: Secondary | ICD-10-CM | POA: Diagnosis not present

## 2020-05-04 DIAGNOSIS — Z79890 Hormone replacement therapy (postmenopausal): Secondary | ICD-10-CM | POA: Diagnosis not present

## 2020-05-04 DIAGNOSIS — Z953 Presence of xenogenic heart valve: Secondary | ICD-10-CM | POA: Diagnosis not present

## 2020-05-04 DIAGNOSIS — I252 Old myocardial infarction: Secondary | ICD-10-CM | POA: Diagnosis not present

## 2020-05-07 ENCOUNTER — Other Ambulatory Visit: Payer: Self-pay

## 2020-05-07 ENCOUNTER — Encounter (HOSPITAL_COMMUNITY)
Admission: RE | Admit: 2020-05-07 | Discharge: 2020-05-07 | Disposition: A | Discharge status: Home / Self Care | Payer: Medicare Other | Source: Ambulatory Visit | Attending: Cardiology | Admitting: Cardiology

## 2020-05-07 DIAGNOSIS — Z79899 Other long term (current) drug therapy: Secondary | ICD-10-CM | POA: Diagnosis not present

## 2020-05-07 DIAGNOSIS — Z7982 Long term (current) use of aspirin: Secondary | ICD-10-CM | POA: Diagnosis not present

## 2020-05-07 DIAGNOSIS — I252 Old myocardial infarction: Secondary | ICD-10-CM | POA: Diagnosis not present

## 2020-05-07 DIAGNOSIS — Z79890 Hormone replacement therapy (postmenopausal): Secondary | ICD-10-CM | POA: Diagnosis not present

## 2020-05-07 DIAGNOSIS — Z953 Presence of xenogenic heart valve: Secondary | ICD-10-CM | POA: Diagnosis not present

## 2020-05-07 DIAGNOSIS — Z951 Presence of aortocoronary bypass graft: Secondary | ICD-10-CM

## 2020-05-09 ENCOUNTER — Other Ambulatory Visit: Payer: Self-pay

## 2020-05-09 ENCOUNTER — Encounter (HOSPITAL_COMMUNITY)
Admission: RE | Admit: 2020-05-09 | Discharge: 2020-05-09 | Disposition: A | Discharge status: Home / Self Care | Payer: Medicare Other | Source: Ambulatory Visit | Attending: Cardiology | Admitting: Cardiology

## 2020-05-09 VITALS — Ht 69.25 in | Wt 159.4 lb

## 2020-05-09 DIAGNOSIS — Z951 Presence of aortocoronary bypass graft: Secondary | ICD-10-CM | POA: Diagnosis not present

## 2020-05-09 DIAGNOSIS — Z7982 Long term (current) use of aspirin: Secondary | ICD-10-CM | POA: Diagnosis not present

## 2020-05-09 DIAGNOSIS — I252 Old myocardial infarction: Secondary | ICD-10-CM | POA: Diagnosis not present

## 2020-05-09 DIAGNOSIS — Z79890 Hormone replacement therapy (postmenopausal): Secondary | ICD-10-CM | POA: Diagnosis not present

## 2020-05-09 DIAGNOSIS — Z953 Presence of xenogenic heart valve: Secondary | ICD-10-CM | POA: Diagnosis not present

## 2020-05-09 DIAGNOSIS — Z79899 Other long term (current) drug therapy: Secondary | ICD-10-CM | POA: Diagnosis not present

## 2020-05-10 NOTE — Progress Notes (Signed)
Discharge Progress Report  Patient Details  Name: Steven Lawrence MRN: [id number] Date of Birth: 06-Nov-1950 Referring Provider:   Flowsheet Row CARDIAC REHAB PHASE II ORIENTATION from 03/13/2020 in [place]  Referring Provider Denice Bors. Stanford Breed, MD        Number of Visits: 22  Reason for Discharge:  Patient reached a stable level of exercise. Patient independent in their exercise. Patient has met program and personal goals.  Smoking History:  Social History   Tobacco Use  Smoking Status Former Smoker  . Packs/day: 1.00  . Years: 53.00  . Pack years: 53.00  . Quit date: 12/16/2019  . Years since quitting: 0.4  Smokeless Tobacco Never Used    Diagnosis:  S/P CABG x 5  ADL UCSD:    Initial Exercise Prescription:  Initial Exercise Prescription - 03/13/20 1000       Date of Initial Exercise RX and Referring Provider   Date 03/13/20    Referring Provider Denice Bors. Stanford Breed, MD    Expected Discharge Date 05/11/20      T5 Nustep   Level 3    SPM 85    Minutes 15    METs 2.4      Track   Laps 12    Minutes 15    METs 2.39      Prescription Details   Frequency (times per week) 3    Duration Progress to 30 minutes of continuous aerobic without signs/symptoms of physical distress      Intensity   THRR 40-80% of Max Heartrate 60-121    Ratings of Perceived Exertion 11-13    Perceived Dyspnea 0-4      Progression   Progression Continue progressive overload as per policy without signs/symptoms or physical distress.      Resistance Training   Training Prescription Yes    Weight 4 lbs    Reps 10-15             Discharge Exercise Prescription (Final Exercise Prescription Changes):  Exercise Prescription Changes - 05/11/20 1000       Response to Exercise   Blood Pressure (Admit) 118/58    Blood Pressure (Exercise) 164/76    Blood Pressure (Exit) 118/60    Heart Rate (Admit) 86 bpm    Heart Rate (Exercise) 119 bpm     Heart Rate (Exit) 95 bpm    Rating of Perceived Exertion (Exercise) 12    Symptoms None    Comments Pt's graduated from the CRP2 program today    Duration Progress to 30 minutes of  aerobic without signs/symptoms of physical distress    Intensity THRR unchanged      Progression   Progression Continue to progress workloads to maintain intensity without signs/symptoms of physical distress.    Average METs 3.5      Resistance Training   Training Prescription Yes    Weight 6 lbs    Reps 10-15    Time 10 Minutes      Interval Training   Interval Training No      T5 Nustep   Level 6    SPM 100    Minutes 15    METs 3.5      Track   Laps 21    Minutes 15    METs 3.44      Home Exercise Plan   Plans to continue exercise at Home (comment)    Frequency Add 2 additional days to program exercise sessions.  Initial Home Exercises Provided 04/09/20             Functional Capacity:  6 Minute Walk     Row Name 03/13/20 0945 05/09/20 0945       6 Minute Walk   Phase Initial Discharge    Distance 1861 feet 1970 feet    Distance % Change -- 5.86 %    Distance Feet Change -- 109 ft    Walk Time 6 minutes 6 minutes    # of Rest Breaks 0 0    MPH 3.52 3.73    METS 4.56 4.65    RPE 13 13    Perceived Dyspnea  0 0    VO2 Peak 15.98 16.28    Symptoms Yes (comment) No    Comments Bilateral chronic knee pain 4/10 --    Resting HR 80 bpm 84 bpm    Resting BP 122/60 106/54    Resting Oxygen Saturation  96 % 98 %    Exercise Oxygen Saturation  during 6 min walk 97 % 99 %    Max Ex. HR 123 bpm 110 bpm    Max Ex. BP 142/70 148/80    2 Minute Post BP 120/66 --             Psychological, QOL, Others - Outcomes: PHQ 2/9: Depression screen [facility] 2/9 05/10/2020 03/13/2020  Decreased Interest 0 0  Down, Depressed, Hopeless 0 0  PHQ - 2 Score 0 0    Quality of Life:  Quality of Life - 05/07/20 1030       Quality of Life Scores   Health/Function Post 28.07 %     Socioeconomic Post 26.79 %    Psych/Spiritual Post 26.14 %    Family Post 27.6 %    GLOBAL Post 27.34 %             Personal Goals: Goals established at orientation with interventions provided to work toward goal.  Personal Goals and Risk Factors at Admission - 03/13/20 1057       Core Components/Risk Factors/Patient Goals on Admission    Weight Management Weight Maintenance    Diabetes Yes    Intervention Provide education about signs/symptoms and action to take for hypo/hyperglycemia.;Provide education about proper nutrition, including hydration, and aerobic/resistive exercise prescription along with prescribed medications to achieve blood glucose in normal ranges: Fasting glucose 65-99 mg/dL    Expected Outcomes Short Term: Participant verbalizes understanding of the signs/symptoms and immediate care of hyper/hypoglycemia, proper foot care and importance of medication, aerobic/resistive exercise and nutrition plan for blood glucose control.;Long Term: Attainment of HbA1C < 7%.    Hypertension Yes    Intervention Provide education on lifestyle modifcations including regular physical activity/exercise, weight management, moderate sodium restriction and increased consumption of fresh fruit, vegetables, and low fat dairy, alcohol moderation, and smoking cessation.;Monitor prescription use compliance.    Expected Outcomes Short Term: Continued assessment and intervention until BP is < 140/49m HG in hypertensive participants. < 130/865mHG in hypertensive participants with diabetes, heart failure or chronic kidney disease.;Long Term: Maintenance of blood pressure at goal levels.    Lipids Yes    Intervention Provide education and support for participant on nutrition & aerobic/resistive exercise along with prescribed medications to achieve LDL <7039mHDL >36m20m  Expected Outcomes Short Term: Participant states understanding of desired cholesterol values and is compliant with medications  prescribed. Participant is following exercise prescription and nutrition guidelines.;Long Term: Cholesterol controlled with medications as prescribed,  with individualized exercise RX and with personalized nutrition plan. Value goals: LDL < 65m, HDL > 40 mg.              Personal Goals Discharge:  Goals and Risk Factor Review     Row Name 03/22/20 1202 04/20/20 0946 05/10/20 1323         Core Components/Risk Factors/Patient Goals Review   Personal Goals Review Weight Management/Obesity;Lipids;Diabetes;Hypertension Weight Management/Obesity;Lipids;Diabetes;Hypertension Weight Management/Obesity;Lipids;Diabetes;Hypertension     Review BLandyis off to a good start to exercise. Jt's vital signs and CBG's have been stable BArifhas been doing good with exercise. BGracehas cut back on his caffeine intake as he was going over his target heart rate. Tavis's has been staying within his target on recent sessions BOsbyhas been doing good with exercise. Jairen completes exercise at cardiac rehab on 05/11/20. Master's vital signs and CBg's have been stable     Expected Outcomes Abriel will continue to participate in phase 2 cardiac rehab for exercise, nutrition and life style modifications BFatewill continue to participate in phase 2 cardiac rehab for exercise, nutrition and life style modifications BJafethwill continue to exercise upon completion of phase 2 cardiac rehab follow exercise and lifestyle modificatons              Exercise Goals and Review:  Exercise Goals     Row Name 03/13/20 1021             Exercise Goals   Increase Physical Activity Yes       Intervention Provide advice, education, support and counseling about physical activity/exercise needs.;Develop an individualized exercise prescription for aerobic and resistive training based on initial evaluation findings, risk stratification, comorbidities and participant's personal goals.       Expected Outcomes Short Term: Attend rehab on a  regular basis to increase amount of physical activity.;Long Term: Add in home exercise to make exercise part of routine and to increase amount of physical activity.;Long Term: Exercising regularly at least 3-5 days a week.       Increase Strength and Stamina Yes       Intervention Provide advice, education, support and counseling about physical activity/exercise needs.;Develop an individualized exercise prescription for aerobic and resistive training based on initial evaluation findings, risk stratification, comorbidities and participant's personal goals.       Expected Outcomes Short Term: Increase workloads from initial exercise prescription for resistance, speed, and METs.;Short Term: Perform resistance training exercises routinely during rehab and add in resistance training at home;Long Term: Improve cardiorespiratory fitness, muscular endurance and strength as measured by increased METs and functional capacity (6MWT)       Able to understand and use rate of perceived exertion (RPE) scale Yes       Intervention Provide education and explanation on how to use RPE scale       Expected Outcomes Short Term: Able to use RPE daily in rehab to express subjective intensity level;Long Term:  Able to use RPE to guide intensity level when exercising independently       Knowledge and understanding of Target Heart Rate Range (THRR) Yes       Intervention Provide education and explanation of THRR including how the numbers were predicted and where they are located for reference       Expected Outcomes Short Term: Able to state/look up THRR;Short Term: Able to use daily as guideline for intensity in rehab;Long Term: Able to use THRR to govern intensity when exercising independently  Understanding of Exercise Prescription Yes       Intervention Provide education, explanation, and written materials on patient's individual exercise prescription       Expected Outcomes Short Term: Able to explain program exercise  prescription;Long Term: Able to explain home exercise prescription to exercise independently                Exercise Goals Re-Evaluation:  Exercise Goals Re-Evaluation     Row Name 03/19/20 1009 04/09/20 1015 04/23/20 1028 05/11/20 1013       Exercise Goal Re-Evaluation   Exercise Goals Review Increase Physical Activity;Increase Strength and Stamina;Able to understand and use rate of perceived exertion (RPE) scale;Knowledge and understanding of Target Heart Rate Range (THRR);Able to check pulse independently;Understanding of Exercise Prescription Increase Physical Activity;Increase Strength and Stamina;Able to understand and use rate of perceived exertion (RPE) scale;Knowledge and understanding of Target Heart Rate Range (THRR);Able to check pulse independently;Understanding of Exercise Prescription Increase Physical Activity;Increase Strength and Stamina;Able to understand and use rate of perceived exertion (RPE) scale;Knowledge and understanding of Target Heart Rate Range (THRR);Able to check pulse independently;Understanding of Exercise Prescription Increase Physical Activity;Increase Strength and Stamina;Able to understand and use rate of perceived exertion (RPE) scale;Knowledge and understanding of Target Heart Rate Range (THRR);Able to check pulse independently;Understanding of Exercise Prescription    Comments Pt's first day of exercise in the CRP2 program. Pt understands the RPE scale, THRR, and his exercise Rx. Reviewed home exercise Rx and METs. Pt is making good progress in the CRP2 program. Pt will walk at home or use his exercise bike 2-3x/week for 30-45 minutes to improve his CV fitness. Reviewed goals. Pt voices he feels he is making good progress toward his goals and walks or rides his bike 3-4x/week on his off days from the CRP2 program. Pt graduated from the [place] program today. Pt progressed well throught the ptogram and had an average MET level of 3.5. Pt plans to continue  exercising at home by walking and going to the [facility].    Expected Outcomes Will continue to monitor patient and progress as tolerated. Pt will exercise at home 2-3x/week for 30-45 minutes. Will continue monitor patient as progress as tolerated. Pt will continue to exercise at home/gym on his own.             Nutrition & Weight - Outcomes:  Pre Biometrics - 03/13/20 0918       Pre Biometrics   Height 5' 9.25" (1.759 m)    Waist Circumference 36.5 inches    Hip Circumference 38.5 inches    Waist to Hip Ratio 0.95 %    BMI (Calculated) 23.11    Triceps Skinfold 11 mm    % Body Fat 22.9 %    Grip Strength 30 kg    Flexibility 17.5 in    Single Leg Stand 7.56 seconds   moderate fall risk            Post Biometrics - 05/09/20 1219        Post  Biometrics   Height 5' 9.25" (1.759 m)    Weight 72.3 kg    Waist Circumference 36 inches    Hip Circumference 38 inches    Waist to Hip Ratio 0.95 %    BMI (Calculated) 23.37    Triceps Skinfold 10 mm    % Body Fat 22.4 %    Grip Strength 40 kg    Flexibility 16.5 in    Single Leg Stand 7.81 seconds  Nutrition:  Nutrition Therapy & Goals - 03/26/20 1123       Personal Nutrition Goals   Nutrition Goal Pt to build a healthy plate including vegetables, fruits, whole grains, and low-fat dairy products in a heart healthy meal plan.    Personal Goal #2 Pt to keep foodlog to determine carbs per meal    Personal Goal #3 Pt to learn to modify carb intake at meals and snacks      Intervention Plan   Intervention Prescribe, educate and counsel regarding individualized specific dietary modifications aiming towards targeted core components such as weight, hypertension, lipid management, diabetes, heart failure and other comorbidities.;Nutrition handout(s) given to patient.    Expected Outcomes Short Term Goal: A plan has been developed with personal nutrition goals set during dietitian appointment.;Long Term Goal:  Adherence to prescribed nutrition plan.             Nutrition Discharge:    Education Questionnaire Score:  Knowledge Questionnaire Score - 05/07/20 1030       Knowledge Questionnaire Score   Post Score 24/24             Goals reviewed with patient; copy given to patient.Pt graduated from cardiac rehab program today with completion of  exercise sessions in Phase II. Pt maintained good attendance and progressed nicely during his participation in rehab as evidenced by increased MET level.   Medication list reconciled. Repeat  PHQ score- 0 .  Pt has made significant lifestyle changes and should be commended for his success. Pt feels he has achieved his goals during cardiac rehab.   Pt plans to continue exercise by walking and attending the [facility] for strength training. Kendrell increased his distance on his post exercise walk test by 109 feet. We are proud of Malcomb's progress. Barnet Pall, RN,BSN 05/22/2020 9:04 AM

## 2020-05-11 ENCOUNTER — Encounter (HOSPITAL_COMMUNITY)
Admission: RE | Admit: 2020-05-11 | Discharge: 2020-05-11 | Disposition: A | Discharge status: Home / Self Care | Payer: Medicare Other | Source: Ambulatory Visit | Attending: Cardiology | Admitting: Cardiology

## 2020-05-11 ENCOUNTER — Other Ambulatory Visit: Payer: Self-pay

## 2020-05-11 DIAGNOSIS — Z953 Presence of xenogenic heart valve: Secondary | ICD-10-CM | POA: Diagnosis not present

## 2020-05-11 DIAGNOSIS — Z79899 Other long term (current) drug therapy: Secondary | ICD-10-CM | POA: Diagnosis not present

## 2020-05-11 DIAGNOSIS — Z951 Presence of aortocoronary bypass graft: Secondary | ICD-10-CM

## 2020-05-11 DIAGNOSIS — Z79890 Hormone replacement therapy (postmenopausal): Secondary | ICD-10-CM | POA: Diagnosis not present

## 2020-05-11 DIAGNOSIS — I252 Old myocardial infarction: Secondary | ICD-10-CM | POA: Diagnosis not present

## 2020-05-11 DIAGNOSIS — Z7982 Long term (current) use of aspirin: Secondary | ICD-10-CM | POA: Diagnosis not present

## 2020-05-14 DIAGNOSIS — L570 Actinic keratosis: Secondary | ICD-10-CM | POA: Diagnosis not present

## 2020-05-14 DIAGNOSIS — Z86018 Personal history of other benign neoplasm: Secondary | ICD-10-CM | POA: Diagnosis not present

## 2020-05-14 DIAGNOSIS — D225 Melanocytic nevi of trunk: Secondary | ICD-10-CM | POA: Diagnosis not present

## 2020-05-14 DIAGNOSIS — L309 Dermatitis, unspecified: Secondary | ICD-10-CM | POA: Diagnosis not present

## 2020-05-14 DIAGNOSIS — L578 Other skin changes due to chronic exposure to nonionizing radiation: Secondary | ICD-10-CM | POA: Diagnosis not present

## 2020-05-14 DIAGNOSIS — L821 Other seborrheic keratosis: Secondary | ICD-10-CM | POA: Diagnosis not present

## 2020-05-14 DIAGNOSIS — L814 Other melanin hyperpigmentation: Secondary | ICD-10-CM | POA: Diagnosis not present

## 2020-06-03 ENCOUNTER — Other Ambulatory Visit: Payer: Self-pay | Admitting: Physician Assistant

## 2020-07-09 ENCOUNTER — Encounter: Payer: Self-pay | Admitting: Cardiology

## 2020-08-20 ENCOUNTER — Ambulatory Visit: Payer: Medicare Other | Admitting: Cardiology

## 2020-08-22 NOTE — Progress Notes (Signed)
HPI: Follow-up coronary artery disease.  Patient hospitalized with non-ST elevation myocardial infarction October 2021.  Echocardiogram showed normal LV function, mild left ventricular hypertrophy, moderate aortic stenosis.  Cardiac catheterization showed an occluded RCA, 95% circumflex and 80% LAD.  Carotid Doppler showed 1 to 39% bilateral stenosis.  Patient subsequently had coronary artery bypass and graft with LIMA to the LAD, saphenous vein graft to the first diagonal, sequential saphenous vein graft to OM 2/OM 3 and saphenous vein graft to the PDA; aortic valve replacement with bovine pericardial valve. Course complicated by atrial fibrillation treated with amiodarone and apixaban.  Follow-up echocardiogram February 08, 2020 showed normal LV function, moderate left ventricular hypertrophy, grade 2 diastolic dysfunction, previous aortic valve replacement with mean gradient 13 mmHg and no aortic insufficiency.  Abdominal ultrasound December 2021 showed no aneurysm. Since last seen patient denies dyspnea, chest pain, palpitations or syncope.  Current Outpatient Medications  Medication Sig Dispense Refill   aspirin EC 81 MG EC tablet Take 1 tablet (81 mg total) by mouth daily. Swallow whole. 30 tablet 11   atorvastatin (LIPITOR) 80 MG tablet Take 80 mg by mouth daily.      canagliflozin (INVOKANA) 100 MG TABS tablet Take 300 mg by mouth daily.     cholestyramine light (PREVALITE) 4 GM/DOSE powder Take 1 g by mouth daily as needed (For stomach).     doxazosin (CARDURA) 2 MG tablet Take 2 mg by mouth every morning. Takes for urinary freq     LEVEMIR FLEXTOUCH 100 UNIT/ML FlexPen Inject 20 Units into the skin at bedtime.     levothyroxine (SYNTHROID, LEVOTHROID) 50 MCG tablet Take 75 mcg by mouth daily before breakfast.      lisinopril (ZESTRIL) 5 MG tablet Take 5 mg by mouth daily.     metFORMIN (GLUCOPHAGE) 1000 MG tablet Take 1,000 mg by mouth 2 (two) times daily with a meal.     ondansetron  (ZOFRAN) 8 MG tablet TAKE 1 TABLET BY MOUTH EVERY 8 HOURS AS NEEDED FOR NAUSEA OR VOMITING. 20 tablet 0   pramipexole (MIRAPEX) 0.125 MG tablet Take 0.125 mg by mouth as directed. x4 RLS at Bedtime     No current facility-administered medications for this visit.     Past Medical History:  Diagnosis Date   Acid reflux    Arthritis    BPH (benign prostatic hyperplasia)    mild   CAD in native artery    a. NSTEMI 12/2019 s/p CABGx5 with bovine AVR.   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, RDI 10.2/hr ([name])   Urinary frequency    Wears glasses    Wears hearing aid    both ears    Past Surgical History:  Procedure Laterality Date   ANTERIOR LAT LUMBAR FUSION Left 01/04/2015   Procedure: ANTERIOR LATERAL LUMBAR FUSION 1 LEVEL;  Surgeon: Phylliss Bob, MD;  Location: [place];  Service: Orthopedics;  Laterality: Left;  Left sided lateral interbody fusion lumbar 3-4 with instrumentation and allograft   AORTIC VALVE REPLACEMENT N/A 12/21/2019   Procedure: AORTIC VALVE REPLACEMENT  WITH INSPIRIS RESILIA AORTIC VALVE SIZE 23MM;  Surgeon: Melrose Nakayama, MD;  Location: [place];  Service: Open Heart Surgery;  Laterality: N/A;   COLONOSCOPY     CORONARY ARTERY BYPASS GRAFT N/A 12/21/2019   Procedure: CORONARY ARTERY BYPASS GRAFTING TIMES FIVE USING LEFT INTERNAL MAMMART ARTERY AND ENDOSCOPICALLY HARVESTED RIGHT GREATER SAPHENOUS VEIN.;  Surgeon: Melrose Nakayama, MD;  Location: [place];  Service: Open Heart Surgery;  Laterality: N/A;   ENDOVEIN HARVEST OF GREATER SAPHENOUS VEIN  12/21/2019   Procedure: ENDOVEIN HARVEST OF RIGHT GREATER SAPHENOUS  VEIN;  Surgeon: Melrose Nakayama, MD;  Location: [place];  Service: Open Heart Surgery;;   ESOPHAGOGASTRODUODENOSCOPY     INGUINAL HERNIA REPAIR Right 08/12/2012   Procedure: HERNIA REPAIR INGUINAL ADULT;  Surgeon: Imogene Burn. Georgette Dover, MD;  Location: [place];  Service: General;  Laterality: Right;   INSERTION OF MESH Right 08/12/2012   Procedure: INSERTION OF MESH;  Surgeon: Imogene Burn. Georgette Dover, MD;  Location: [place];  Service: General;  Laterality: Right;   LEFT HEART CATH AND CORONARY ANGIOGRAPHY N/A 12/19/2019   Procedure: LEFT HEART CATH AND CORONARY ANGIOGRAPHY;  Surgeon: Lorretta Harp, MD;  Location: [facility];  Service: Cardiovascular;  Laterality: N/A;   TEE WITHOUT CARDIOVERSION N/A 12/21/2019   Procedure: TRANSESOPHAGEAL ECHOCARDIOGRAM (TEE);  Surgeon: Melrose Nakayama, MD;  Location: [place];  Service: Open Heart Surgery;  Laterality: N/A;   TONSILLECTOMY     VASECTOMY  1985    Social History   Socioeconomic History   Marital status: Married    Spouse name: Not on file   Number of children: Not on file   Years of education: Not on file   Highest education level: Not on file  Occupational History   Not on file  Tobacco Use   Smoking status: Former    Packs/day: 1.00    Years: 53.00    Pack years: 53.00    Types: Cigarettes    Quit date: 12/16/2019    Years since quitting: 0.7   Smokeless tobacco: Never  Substance and Sexual Activity   Alcohol use: Yes    Comment: occ   Drug use: No   Sexual activity: Not on file  Other Topics Concern   Not on file  Social History Narrative   Not on file   Social Determinants of Health   Financial Resource Strain: Not on file  Food Insecurity: Not on file  Transportation Needs: Not on file  Physical Activity: Not on file  Stress: Not on file  Social Connections: Not on file  Intimate Partner Violence: Not on file    Family History  Problem Relation Age of Onset   Diabetes  Mother    Heart disease Mother    Parkinson's disease Mother    Heart disease Father    Cancer Father        melonma/Lung   Diabetes Sister    Diabetes Brother    Heart disease Brother    Multiple sclerosis Daughter     ROS: no fevers or chills, productive cough, hemoptysis, dysphasia, odynophagia, melena, hematochezia, dysuria, hematuria, rash, seizure activity, orthopnea, PND, pedal edema, claudication. Remaining systems are negative.  Physical Exam: Well-developed well-nourished in no acute distress.  Skin is warm and dry.  HEENT is normal.  Neck is supple.  Chest is clear to auscultation with normal expansion.  Cardiovascular exam is regular rate and rhythm.  2/6 systolic murmur left sternal border.  Abdominal exam nontender or distended. No masses palpated. Extremities show no edema. neuro grossly intact   A/P  1 coronary artery disease status post coronary artery bypass graft-patient denies chest pain.  Continue medical therapy with aspirin and statin.  2 previous aortic valve replacement-continue SBE prophylaxis.  3 hyperlipidemia-continue statin.  Last LDL not at goal.  We will recheck today.  If greater than 70 we will likely add Zetia to medical regimen.  4 tobacco abuse-patient discontinued previously.  [name], MD

## 2020-09-05 ENCOUNTER — Other Ambulatory Visit: Payer: Self-pay

## 2020-09-05 ENCOUNTER — Encounter: Payer: Self-pay | Admitting: Cardiology

## 2020-09-05 ENCOUNTER — Ambulatory Visit (INDEPENDENT_AMBULATORY_CARE_PROVIDER_SITE_OTHER): Payer: Medicare Other | Admitting: Cardiology

## 2020-09-05 VITALS — BP 120/60 | HR 72 | Ht 69.0 in | Wt 161.0 lb

## 2020-09-05 DIAGNOSIS — Z951 Presence of aortocoronary bypass graft: Secondary | ICD-10-CM

## 2020-09-05 DIAGNOSIS — E785 Hyperlipidemia, unspecified: Secondary | ICD-10-CM | POA: Diagnosis not present

## 2020-09-05 DIAGNOSIS — I2510 Atherosclerotic heart disease of native coronary artery without angina pectoris: Secondary | ICD-10-CM

## 2020-09-05 DIAGNOSIS — Z952 Presence of prosthetic heart valve: Secondary | ICD-10-CM

## 2020-09-05 DIAGNOSIS — I10 Essential (primary) hypertension: Secondary | ICD-10-CM | POA: Diagnosis not present

## 2020-09-05 LAB — HEPATIC FUNCTION PANEL
ALT: 17 IU/L (ref 0–44)
AST: 18 IU/L (ref 0–40)
Albumin: 4.5 g/dL (ref 3.8–4.8)
Alkaline Phosphatase: 66 IU/L (ref 44–121)
Bilirubin Total: 0.6 mg/dL (ref 0.0–1.2)
Bilirubin, Direct: 0.18 mg/dL (ref 0.00–0.40)
Total Protein: 6.4 g/dL (ref 6.0–8.5)

## 2020-09-05 LAB — LIPID PANEL
Chol/HDL Ratio: 2.1 ratio (ref 0.0–5.0)
Cholesterol, Total: 115 mg/dL (ref 100–199)
HDL: 55 mg/dL (ref >39)
LDL Chol Calc (NIH): 48 mg/dL (ref 0–99)
Triglycerides: 48 mg/dL (ref 0–149)
VLDL Cholesterol Cal: 12 mg/dL (ref 5–40)

## 2020-09-05 NOTE — Patient Instructions (Signed)

## 2020-09-13 DIAGNOSIS — I2510 Atherosclerotic heart disease of native coronary artery without angina pectoris: Secondary | ICD-10-CM | POA: Diagnosis not present

## 2020-09-13 DIAGNOSIS — Z7984 Long term (current) use of oral hypoglycemic drugs: Secondary | ICD-10-CM | POA: Diagnosis not present

## 2020-09-13 DIAGNOSIS — E1169 Type 2 diabetes mellitus with other specified complication: Secondary | ICD-10-CM | POA: Diagnosis not present

## 2020-09-13 DIAGNOSIS — R911 Solitary pulmonary nodule: Secondary | ICD-10-CM | POA: Diagnosis not present

## 2020-09-13 DIAGNOSIS — Z1211 Encounter for screening for malignant neoplasm of colon: Secondary | ICD-10-CM | POA: Diagnosis not present

## 2020-09-13 DIAGNOSIS — I700 Atherosclerosis of aorta: Secondary | ICD-10-CM | POA: Diagnosis not present

## 2020-09-13 DIAGNOSIS — D6869 Other thrombophilia: Secondary | ICD-10-CM | POA: Diagnosis not present

## 2020-09-13 DIAGNOSIS — E7800 Pure hypercholesterolemia, unspecified: Secondary | ICD-10-CM | POA: Diagnosis not present

## 2020-09-13 DIAGNOSIS — I252 Old myocardial infarction: Secondary | ICD-10-CM | POA: Diagnosis not present

## 2020-09-13 DIAGNOSIS — E039 Hypothyroidism, unspecified: Secondary | ICD-10-CM | POA: Diagnosis not present

## 2020-09-13 DIAGNOSIS — G2581 Restless legs syndrome: Secondary | ICD-10-CM | POA: Diagnosis not present

## 2020-09-13 DIAGNOSIS — Z952 Presence of prosthetic heart valve: Secondary | ICD-10-CM | POA: Diagnosis not present

## 2020-10-10 ENCOUNTER — Other Ambulatory Visit: Payer: Self-pay | Admitting: *Deleted

## 2020-10-10 DIAGNOSIS — F17210 Nicotine dependence, cigarettes, uncomplicated: Secondary | ICD-10-CM

## 2020-10-10 DIAGNOSIS — Z87891 Personal history of nicotine dependence: Secondary | ICD-10-CM

## 2020-10-17 ENCOUNTER — Other Ambulatory Visit: Payer: Self-pay | Admitting: *Deleted

## 2020-11-01 DIAGNOSIS — Z1211 Encounter for screening for malignant neoplasm of colon: Secondary | ICD-10-CM | POA: Diagnosis not present

## 2020-11-06 ENCOUNTER — Other Ambulatory Visit: Payer: Self-pay

## 2020-11-06 ENCOUNTER — Ambulatory Visit
Admission: RE | Admit: 2020-11-06 | Discharge: 2020-11-06 | Disposition: A | Discharge status: Home / Self Care | Payer: Medicare Other | Source: Ambulatory Visit | Attending: Acute Care | Admitting: Acute Care

## 2020-11-06 DIAGNOSIS — F17210 Nicotine dependence, cigarettes, uncomplicated: Secondary | ICD-10-CM

## 2020-11-06 DIAGNOSIS — J929 Pleural plaque without asbestos: Secondary | ICD-10-CM | POA: Diagnosis not present

## 2020-11-06 DIAGNOSIS — I2510 Atherosclerotic heart disease of native coronary artery without angina pectoris: Secondary | ICD-10-CM | POA: Diagnosis not present

## 2020-11-06 DIAGNOSIS — J948 Other specified pleural conditions: Secondary | ICD-10-CM | POA: Diagnosis not present

## 2020-11-06 DIAGNOSIS — Z87891 Personal history of nicotine dependence: Secondary | ICD-10-CM | POA: Diagnosis not present

## 2020-11-13 DIAGNOSIS — E113293 Type 2 diabetes mellitus with mild nonproliferative diabetic retinopathy without macular edema, bilateral: Secondary | ICD-10-CM | POA: Diagnosis not present

## 2020-11-13 DIAGNOSIS — H353112 Nonexudative age-related macular degeneration, right eye, intermediate dry stage: Secondary | ICD-10-CM | POA: Diagnosis not present

## 2020-11-13 DIAGNOSIS — H26493 Other secondary cataract, bilateral: Secondary | ICD-10-CM | POA: Diagnosis not present

## 2020-11-19 NOTE — Progress Notes (Signed)
Please call patient and let them  know their  low dose Ct was read as a Lung RADS 2: nodules that are benign in appearance and behavior with a very low likelihood of becoming a clinically active cancer due to size or lack of growth. Recommendation per radiology is for a repeat LDCT in 12 months. .Please let them  know we will order and schedule their  annual screening scan for 10/2021. Please let them  know there was notation of CAD on their  scan.  Please remind the patient  that this is a non-gated exam therefore degree or severity of disease  cannot be determined. Please have them  follow up with their PCP regarding potential risk factor modification, dietary therapy or pharmacologic therapy if clinically indicated. Pt.  is  currently on statin therapy. Please place order for annual  screening scan for  10/2021 and fax results to PCP. Thanks so much. + CAD, Post CABG, On statin, followed by cardiology

## 2020-11-20 ENCOUNTER — Other Ambulatory Visit: Payer: Self-pay | Admitting: *Deleted

## 2020-11-20 DIAGNOSIS — Z87891 Personal history of nicotine dependence: Secondary | ICD-10-CM

## 2021-01-21 DIAGNOSIS — Z23 Encounter for immunization: Secondary | ICD-10-CM | POA: Diagnosis not present

## 2021-01-31 DIAGNOSIS — J069 Acute upper respiratory infection, unspecified: Secondary | ICD-10-CM | POA: Diagnosis not present

## 2021-01-31 DIAGNOSIS — R0989 Other specified symptoms and signs involving the circulatory and respiratory systems: Secondary | ICD-10-CM | POA: Diagnosis not present

## 2021-01-31 DIAGNOSIS — Z20822 Contact with and (suspected) exposure to covid-19: Secondary | ICD-10-CM | POA: Diagnosis not present

## 2021-01-31 DIAGNOSIS — R059 Cough, unspecified: Secondary | ICD-10-CM | POA: Diagnosis not present

## 2021-03-12 DIAGNOSIS — Z951 Presence of aortocoronary bypass graft: Secondary | ICD-10-CM | POA: Diagnosis not present

## 2021-03-12 DIAGNOSIS — E7800 Pure hypercholesterolemia, unspecified: Secondary | ICD-10-CM | POA: Diagnosis not present

## 2021-03-12 DIAGNOSIS — G4730 Sleep apnea, unspecified: Secondary | ICD-10-CM | POA: Diagnosis not present

## 2021-03-12 DIAGNOSIS — Z125 Encounter for screening for malignant neoplasm of prostate: Secondary | ICD-10-CM | POA: Diagnosis not present

## 2021-03-12 DIAGNOSIS — E1169 Type 2 diabetes mellitus with other specified complication: Secondary | ICD-10-CM | POA: Diagnosis not present

## 2021-03-12 DIAGNOSIS — I252 Old myocardial infarction: Secondary | ICD-10-CM | POA: Diagnosis not present

## 2021-03-12 DIAGNOSIS — E039 Hypothyroidism, unspecified: Secondary | ICD-10-CM | POA: Diagnosis not present

## 2021-03-12 DIAGNOSIS — Z952 Presence of prosthetic heart valve: Secondary | ICD-10-CM | POA: Diagnosis not present

## 2021-03-12 DIAGNOSIS — E11319 Type 2 diabetes mellitus with unspecified diabetic retinopathy without macular edema: Secondary | ICD-10-CM | POA: Diagnosis not present

## 2021-03-12 DIAGNOSIS — Z0000 Encounter for general adult medical examination without abnormal findings: Secondary | ICD-10-CM | POA: Diagnosis not present

## 2021-03-12 DIAGNOSIS — R911 Solitary pulmonary nodule: Secondary | ICD-10-CM | POA: Diagnosis not present

## 2021-03-12 DIAGNOSIS — D6869 Other thrombophilia: Secondary | ICD-10-CM | POA: Diagnosis not present

## 2021-03-12 DIAGNOSIS — I2510 Atherosclerotic heart disease of native coronary artery without angina pectoris: Secondary | ICD-10-CM | POA: Diagnosis not present

## 2021-05-13 DIAGNOSIS — L570 Actinic keratosis: Secondary | ICD-10-CM | POA: Diagnosis not present

## 2021-05-13 DIAGNOSIS — D225 Melanocytic nevi of trunk: Secondary | ICD-10-CM | POA: Diagnosis not present

## 2021-05-13 DIAGNOSIS — Z23 Encounter for immunization: Secondary | ICD-10-CM | POA: Diagnosis not present

## 2021-05-13 DIAGNOSIS — L578 Other skin changes due to chronic exposure to nonionizing radiation: Secondary | ICD-10-CM | POA: Diagnosis not present

## 2021-05-13 DIAGNOSIS — L814 Other melanin hyperpigmentation: Secondary | ICD-10-CM | POA: Diagnosis not present

## 2021-05-13 DIAGNOSIS — Z86018 Personal history of other benign neoplasm: Secondary | ICD-10-CM | POA: Diagnosis not present

## 2021-05-13 DIAGNOSIS — L821 Other seborrheic keratosis: Secondary | ICD-10-CM | POA: Diagnosis not present

## 2021-05-29 DIAGNOSIS — E039 Hypothyroidism, unspecified: Secondary | ICD-10-CM | POA: Diagnosis not present

## 2021-08-01 IMAGING — DX DG CHEST 1V PORT
1 series · 1 of 1 positions shown · non-contrast
Comparison: 12/16/2019

CLINICAL DATA: History of coronary bypass grafting and aortic valve
replacement

EXAM:
PORTABLE CHEST 1 VIEW

[chest]
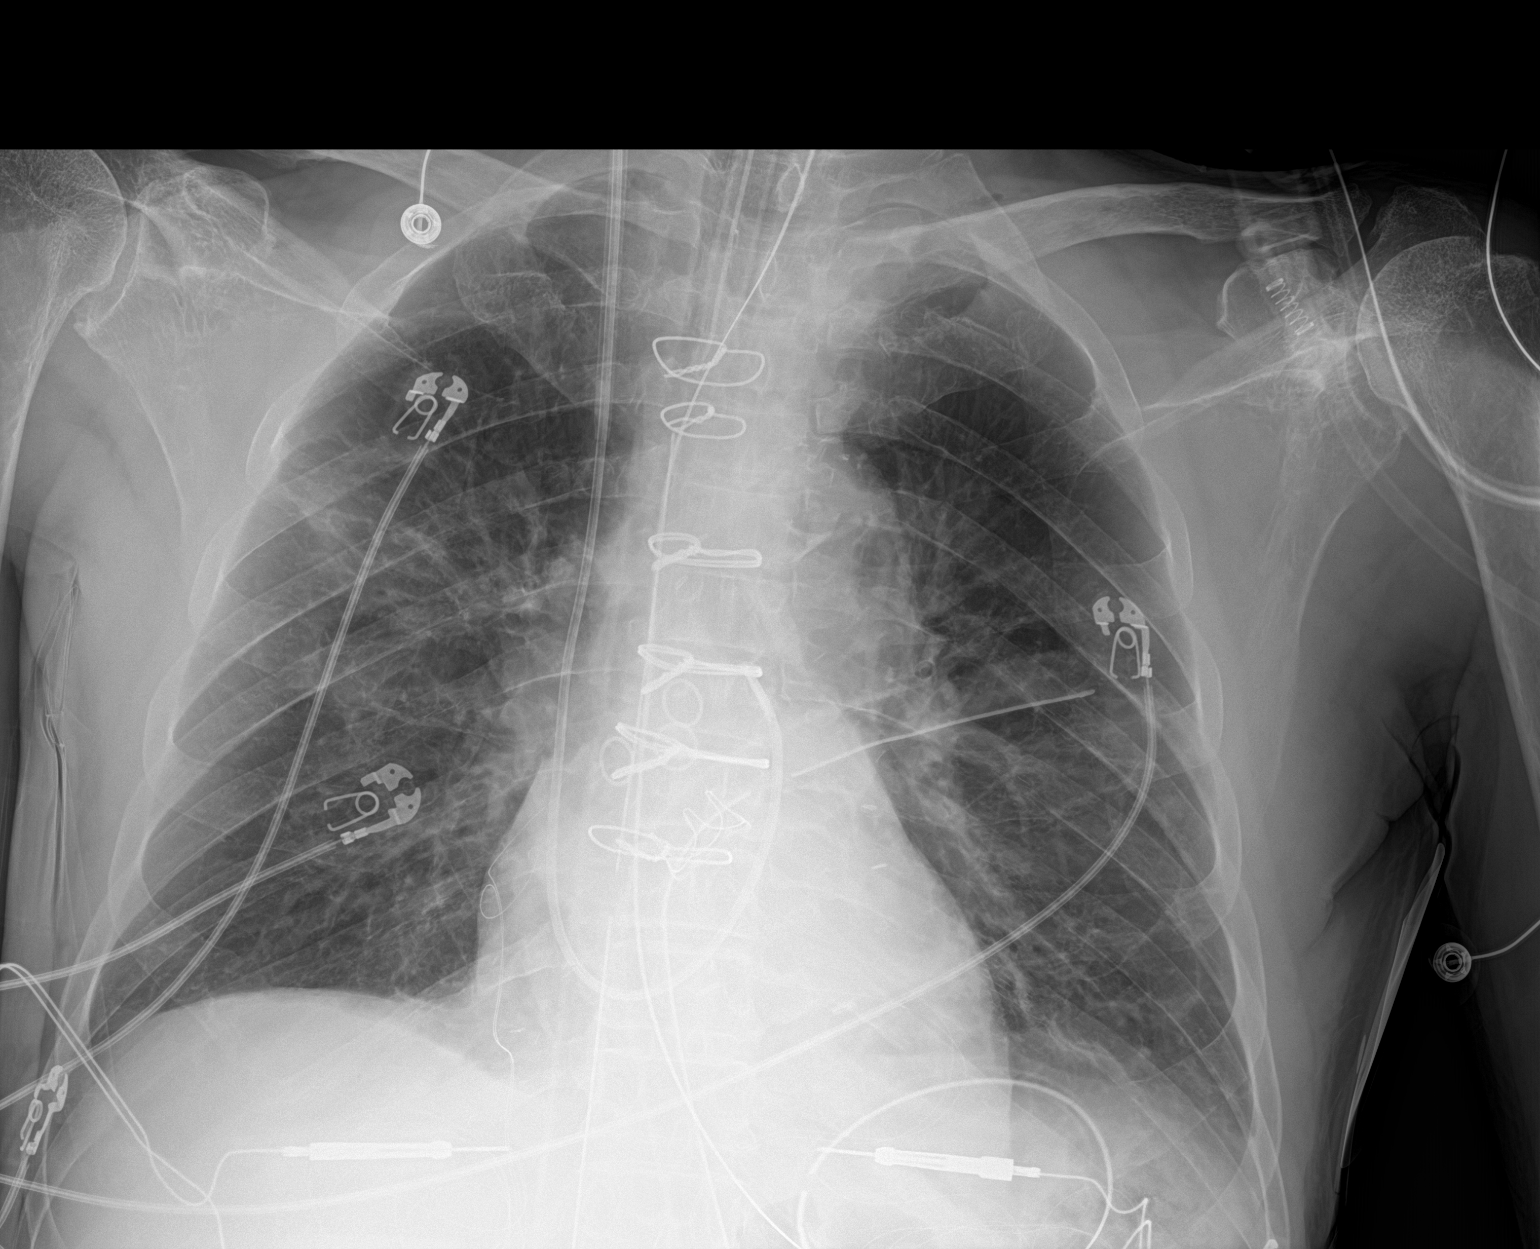

[1 of 1 positions shown; findings below may reference images not displayed]

FINDINGS: Cardiac shadow is stable. Postsurgical changes are now seen new from
the prior exam. Swan-Ganz catheter is noted in the pulmonary outflow
tract. Mediastinal drain, left thoracostomy catheter, and gastric
catheter and endotracheal tube are noted in satisfactory position.
Patchy atelectatic changes are noted in the right mid lung and left
base. No pneumothorax is seen. No bony abnormality is noted.
IMPRESSION: Tubes and lines as described above consistent with the postoperative
state.

Mild bilateral atelectatic changes.

## 2021-08-13 DIAGNOSIS — E039 Hypothyroidism, unspecified: Secondary | ICD-10-CM | POA: Diagnosis not present

## 2021-09-04 NOTE — Progress Notes (Signed)
Office Visit    Patient Name: Steven Lawrence Date of Encounter: 09/05/2021  Primary Care Provider:  Lennie Odor, [place] Primary Cardiologist:  [name], MD  Chief Complaint    71 year old male with a history of CAD s/p CABG x5 (LIMA-LAD, SVG-D1, sequential SVG-OM 2/OM 3 and SVG-PDA), aortic stenosis s/p AVR in 2021, hyperlipidemia, OSA, type 2 diabetes, and tobacco use who presents for follow-up related to CAD.  Past Medical History    Past Medical History:  Diagnosis Date   Acid reflux    Arthritis    BPH (benign prostatic hyperplasia)    mild   CAD in native artery    a. NSTEMI 12/2019 s/p CABGx5 with bovine AVR.   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   History of pneumonia as a child    Hypercholesteremia    Hypothyroidism    RLS (restless legs syndrome)    S/P aortic valve replacement with bioprosthetic valve    S/P CABG (coronary artery bypass graft)    Sleep apnea    does not use a cpap,primarily with upper airway resistance syndrome AHI 2.35/hr, RDI 10.2/hr ([name])   Urinary frequency    Wears glasses    Wears hearing aid    both ears   Past Surgical History:  Procedure Laterality Date   ANTERIOR LAT LUMBAR FUSION Left 01/04/2015   Procedure: ANTERIOR LATERAL LUMBAR FUSION 1 LEVEL;  Surgeon: Phylliss Bob, MD;  Location: [place];  Service: Orthopedics;  Laterality: Left;  Left sided lateral interbody fusion lumbar 3-4 with instrumentation and allograft   AORTIC VALVE REPLACEMENT N/A 12/21/2019   Procedure: AORTIC VALVE REPLACEMENT WITH INSPIRIS RESILIA AORTIC VALVE SIZE 23MM;  Surgeon: Melrose Nakayama, MD;  Location: [place];  Service: Open Heart Surgery;  Laterality: N/A;   COLONOSCOPY     CORONARY ARTERY BYPASS GRAFT N/A 12/21/2019   Procedure: CORONARY ARTERY BYPASS GRAFTING TIMES FIVE USING LEFT INTERNAL [employer] ARTERY AND ENDOSCOPICALLY HARVESTED RIGHT GREATER SAPHENOUS VEIN.;  Surgeon: Melrose Nakayama, MD;  Location: [place];  Service: Open Heart Surgery;  Laterality: N/A;   ENDOVEIN HARVEST OF GREATER SAPHENOUS VEIN  12/21/2019   Procedure: ENDOVEIN HARVEST OF RIGHT GREATER SAPHENOUS VEIN;  Surgeon: Melrose Nakayama, MD;  Location: [place];  Service: Open Heart Surgery;;   ESOPHAGOGASTRODUODENOSCOPY     INGUINAL HERNIA REPAIR Right 08/12/2012   Procedure: HERNIA REPAIR INGUINAL ADULT;  Surgeon: Imogene Burn. Georgette Dover, MD;  Location: [place];  Service: General;  Laterality: Right;   INSERTION OF MESH Right 08/12/2012   Procedure: INSERTION OF MESH;  Surgeon: Imogene Burn. Georgette Dover, MD;  Location: [place];  Service: General;  Laterality: Right;   LEFT HEART CATH AND CORONARY ANGIOGRAPHY N/A 12/19/2019   Procedure: LEFT HEART CATH AND CORONARY ANGIOGRAPHY;  Surgeon: Lorretta Harp, MD;  Location: [facility] [facility];  Service: Cardiovascular;  Laterality: N/A;   TEE WITHOUT CARDIOVERSION N/A 12/21/2019   Procedure: TRANSESOPHAGEAL ECHOCARDIOGRAM (TEE);  Surgeon: Melrose Nakayama, MD;  Location: [place];  Service: Open Heart Surgery;  Laterality: N/A;   TONSILLECTOMY     VASECTOMY  1985    Allergies  No Known Allergies  History of Present Illness   71 year old male with the above past medical history of CAD s/p CABG x5 (LIMA-LAD, SVG-D1, sequential SVG-OM 2/OM 3 and SVG-PDA), aortic stenosis s/p AVR in 2021, hyperlipidemia, OSA, type 2 diabetes, and tobacco  use.  He was hospitalized in October 2021 in the setting of NSTEMI. Echocardiogram at the time showed normal LV function, mild LVH, moderate aortic stenosis.  Cardiac catheterization showed RCA 95%, LCx 80%, and LAD 80% stenoses. CT surgery was consulted and he underwent CABG x 5 (LIMA-LAD, SVG-D1, sequential SVG-OM 2/OM 3 and SVG-PDA), in addition to aortic valve replacement with bioprosthetic bovine pericardial valve.  Pre-CABG carotid Doppler showed 1 to 39% B ICA stenosis.  Post hospital course was  complicated by atrial fibrillation, treated with amiodarone and Eliquis.  Follow-up echocardiogram in November 2021 showed normal LV function, moderate LVH, G2 DD, previous aortic valve replacement with mean gradient 13 mmHg, no aortic insufficiency.  He was last seen in the office on 09/05/2020 and was stable from a cardiac standpoint. He denied symptoms concerning for angina.  He presents today for follow-up.  Since his last visit he notes ongoing intermittent right-sided chest discomfort with activity. He states he noted the symptoms at his visit last year with Dr. Stanford Breed, and his discomfort was thought to be atypical.  He has follow-up scheduled with his PCP to discuss possibility of hiatal hernia.  He remains active.  He states that his chest discomfort is noticeable upon initiating any physical activity, however, the more active he is, his symptoms improve. Other than his intermittent right-sided chest discomfort, he denies any additional concerns today.  Home Medications    Current Outpatient Medications  Medication Sig Dispense Refill   aspirin EC 81 MG EC tablet Take 1 tablet (81 mg total) by mouth daily. Swallow whole. 30 tablet 11   atorvastatin (LIPITOR) 80 MG tablet Take 80 mg by mouth daily.      canagliflozin (INVOKANA) 100 MG TABS tablet Take 300 mg by mouth daily.     cholestyramine light (PREVALITE) 4 GM/DOSE powder Take 1 g by mouth daily as needed (For stomach).     doxazosin (CARDURA) 2 MG tablet Take 2 mg by mouth every morning. Takes for urinary freq     LEVEMIR FLEXTOUCH 100 UNIT/ML FlexPen Inject 20 Units into the skin at bedtime.     levothyroxine (SYNTHROID, LEVOTHROID) 50 MCG tablet Take 75 mcg by mouth daily before breakfast.      lisinopril (ZESTRIL) 5 MG tablet Take 5 mg by mouth daily.     metFORMIN (GLUCOPHAGE) 1000 MG tablet Take 1,000 mg by mouth 2 (two) times daily with a meal.     pramipexole (MIRAPEX) 0.125 MG tablet Take 0.125 mg by mouth as directed. x4 RLS  at Bedtime     No current facility-administered medications for this visit.     Review of Systems    He denies chest pain, palpitations, dyspnea, pnd, orthopnea, n, v, dizziness, syncope, edema, weight gain, or early satiety. All other systems reviewed and are otherwise negative except as noted above.   Physical Exam    VS:  BP (!) 128/58   Pulse 68   Ht '5\' 9"'$  (1.753 m)   Wt 162 lb 12.8 oz (73.8 kg)   SpO2 97%   BMI 24.04 kg/m   GEN: Well nourished, well developed, in no acute distress. HEENT: normal. Neck: Supple, no JVD, carotid bruits, or masses. Cardiac: RRR, 2/6 murmur, no rubs, or gallops. No clubbing, cyanosis, edema.  Radials/DP/PT 2+ and equal bilaterally.  Respiratory:  Respirations regular and unlabored, clear to auscultation bilaterally. GI: Soft, nontender, nondistended, BS + x 4. MS: no deformity or atrophy. Skin: warm and dry, no rash. Neuro:  Strength and sensation are intact. Psych: Normal affect.  Accessory Clinical Findings    ECG personally reviewed by me today -NSR, 68 bpm, LAD, incomplete RBBB- no acute changes.  Lab Results  Component Value Date   WBC 6.6 12/26/2019   HGB 8.1 (L) 12/26/2019   HCT 23.9 (L) 12/26/2019   MCV 97.2 12/26/2019   PLT 140 (L) 12/26/2019   Lab Results  Component Value Date   CREATININE 1.05 12/26/2019   BUN 21 12/26/2019   NA 137 12/26/2019   K 3.5 12/26/2019   CL 103 12/26/2019   CO2 24 12/26/2019   Lab Results  Component Value Date   ALT 17 09/05/2020   AST 18 09/05/2020   ALKPHOS 66 09/05/2020   BILITOT 0.6 09/05/2020   Lab Results  Component Value Date   CHOL 115 09/05/2020   HDL 55 09/05/2020   LDLCALC 48 09/05/2020   TRIG 48 09/05/2020   CHOLHDL 2.1 09/05/2020    Lab Results  Component Value Date   HGBA1C 7.1 (H) 12/17/2019    Assessment & Plan   1. CAD/precordial pain: S/p CABG x 5 (LIMA-LAD, SVG-D1, sequential SVG-OM 2/OM 3 and SVG-PDA) in 2021.  Over the past year he has noticed  intermittent right-sided chest discomfort with activity.  His symptoms actually improve with prolonged activity and are mostly noticeable upon initiation of any physical exertion. Dissimilar to his prior anginal equivalent.  He denies dyspnea, denies any other symptoms concerning for angina. He is wondering whether or not he has a hiatal hernia and plans to discuss this with his PCP next week.  Discussed possibility of ischemic evaluation with Lexiscan Myoview versus cardiac PET stress test.  Patient declines further ischemic evaluation at this time and would prefer to wait.  Discussed ED precautions, advised him to notify us if his symptoms worsen.  Continue aspirin, lisinopril, Lipitor.  2. Aortic valve stenosis: S/p AVR in 2021. Denies dyspnea, edema, PND, orthopnea. Continue SBE prophylaxis   3. Paroxysmal atrial fibrillation: Occurred in the postop setting, no recurrence.  Maintaining normal sinus rhythm.  Not on anticoagulation.  4. Hyperlipidemia: LDL was 47 in 02/2021. Continue Lipitor.  5. OSA: Not on CPAP.  Denies any concerns.  6. Type 2 diabetes: A1c was 7.2 in 02/2021.  Monitored and managed per PCP.  7. Former tobacco use: He no longer smokes.  I congratulated him on this.  8. Disposition: Follow-up in 6 months with [facility].   Lenna Sciara, NP 09/05/2021, 9:57 AM

## 2021-09-05 ENCOUNTER — Ambulatory Visit (INDEPENDENT_AMBULATORY_CARE_PROVIDER_SITE_OTHER): Payer: Medicare Other | Admitting: Nurse Practitioner

## 2021-09-05 ENCOUNTER — Encounter: Payer: Self-pay | Admitting: Nurse Practitioner

## 2021-09-05 VITALS — BP 128/58 | HR 68 | Ht 69.0 in | Wt 162.8 lb

## 2021-09-05 DIAGNOSIS — Z952 Presence of prosthetic heart valve: Secondary | ICD-10-CM | POA: Diagnosis not present

## 2021-09-05 DIAGNOSIS — G4733 Obstructive sleep apnea (adult) (pediatric): Secondary | ICD-10-CM

## 2021-09-05 DIAGNOSIS — E785 Hyperlipidemia, unspecified: Secondary | ICD-10-CM | POA: Diagnosis not present

## 2021-09-05 DIAGNOSIS — I4891 Unspecified atrial fibrillation: Secondary | ICD-10-CM | POA: Diagnosis not present

## 2021-09-05 DIAGNOSIS — R072 Precordial pain: Secondary | ICD-10-CM

## 2021-09-05 DIAGNOSIS — I350 Nonrheumatic aortic (valve) stenosis: Secondary | ICD-10-CM | POA: Diagnosis not present

## 2021-09-05 DIAGNOSIS — I9789 Other postprocedural complications and disorders of the circulatory system, not elsewhere classified: Secondary | ICD-10-CM | POA: Diagnosis not present

## 2021-09-05 DIAGNOSIS — Z87891 Personal history of nicotine dependence: Secondary | ICD-10-CM | POA: Diagnosis not present

## 2021-09-05 DIAGNOSIS — E119 Type 2 diabetes mellitus without complications: Secondary | ICD-10-CM

## 2021-09-05 DIAGNOSIS — I2510 Atherosclerotic heart disease of native coronary artery without angina pectoris: Secondary | ICD-10-CM | POA: Diagnosis not present

## 2021-09-05 DIAGNOSIS — Z794 Long term (current) use of insulin: Secondary | ICD-10-CM | POA: Diagnosis not present

## 2021-09-10 DIAGNOSIS — E039 Hypothyroidism, unspecified: Secondary | ICD-10-CM | POA: Diagnosis not present

## 2021-09-10 DIAGNOSIS — R079 Chest pain, unspecified: Secondary | ICD-10-CM | POA: Diagnosis not present

## 2021-09-10 DIAGNOSIS — I2510 Atherosclerotic heart disease of native coronary artery without angina pectoris: Secondary | ICD-10-CM | POA: Diagnosis not present

## 2021-09-10 DIAGNOSIS — E11319 Type 2 diabetes mellitus with unspecified diabetic retinopathy without macular edema: Secondary | ICD-10-CM | POA: Diagnosis not present

## 2021-09-10 DIAGNOSIS — F17200 Nicotine dependence, unspecified, uncomplicated: Secondary | ICD-10-CM | POA: Diagnosis not present

## 2021-09-10 DIAGNOSIS — I700 Atherosclerosis of aorta: Secondary | ICD-10-CM | POA: Diagnosis not present

## 2021-09-10 DIAGNOSIS — E7800 Pure hypercholesterolemia, unspecified: Secondary | ICD-10-CM | POA: Diagnosis not present

## 2021-09-10 DIAGNOSIS — E1169 Type 2 diabetes mellitus with other specified complication: Secondary | ICD-10-CM | POA: Diagnosis not present

## 2021-10-09 IMAGING — CR DG CHEST 2V
2 series · 2 of 2 positions shown · non-contrast
Comparison: Two-view chest x-ray 01/31/2020

CLINICAL DATA: Status post CABG.

EXAM:
CHEST - 2 VIEW

[w chest pa]
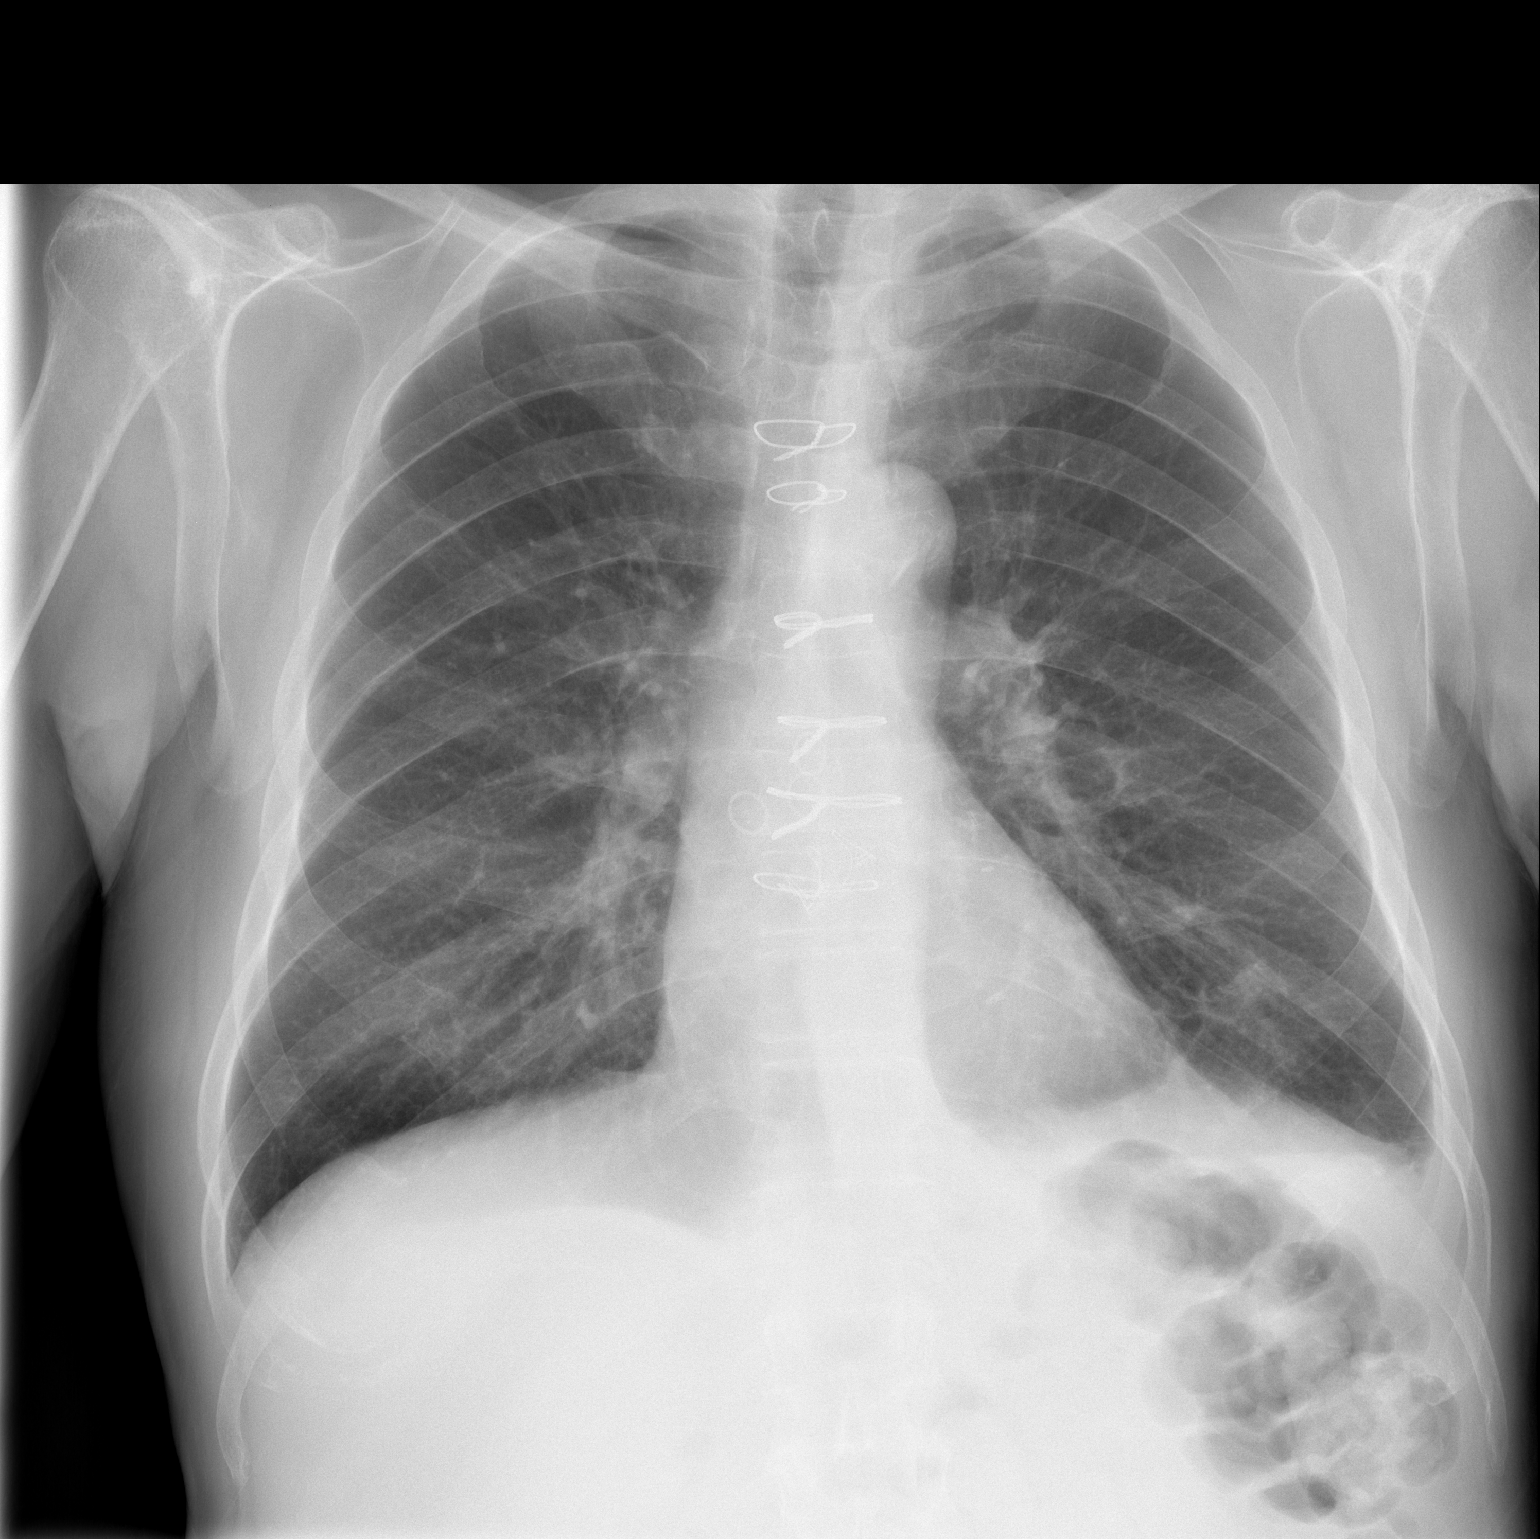

[w chest lat]
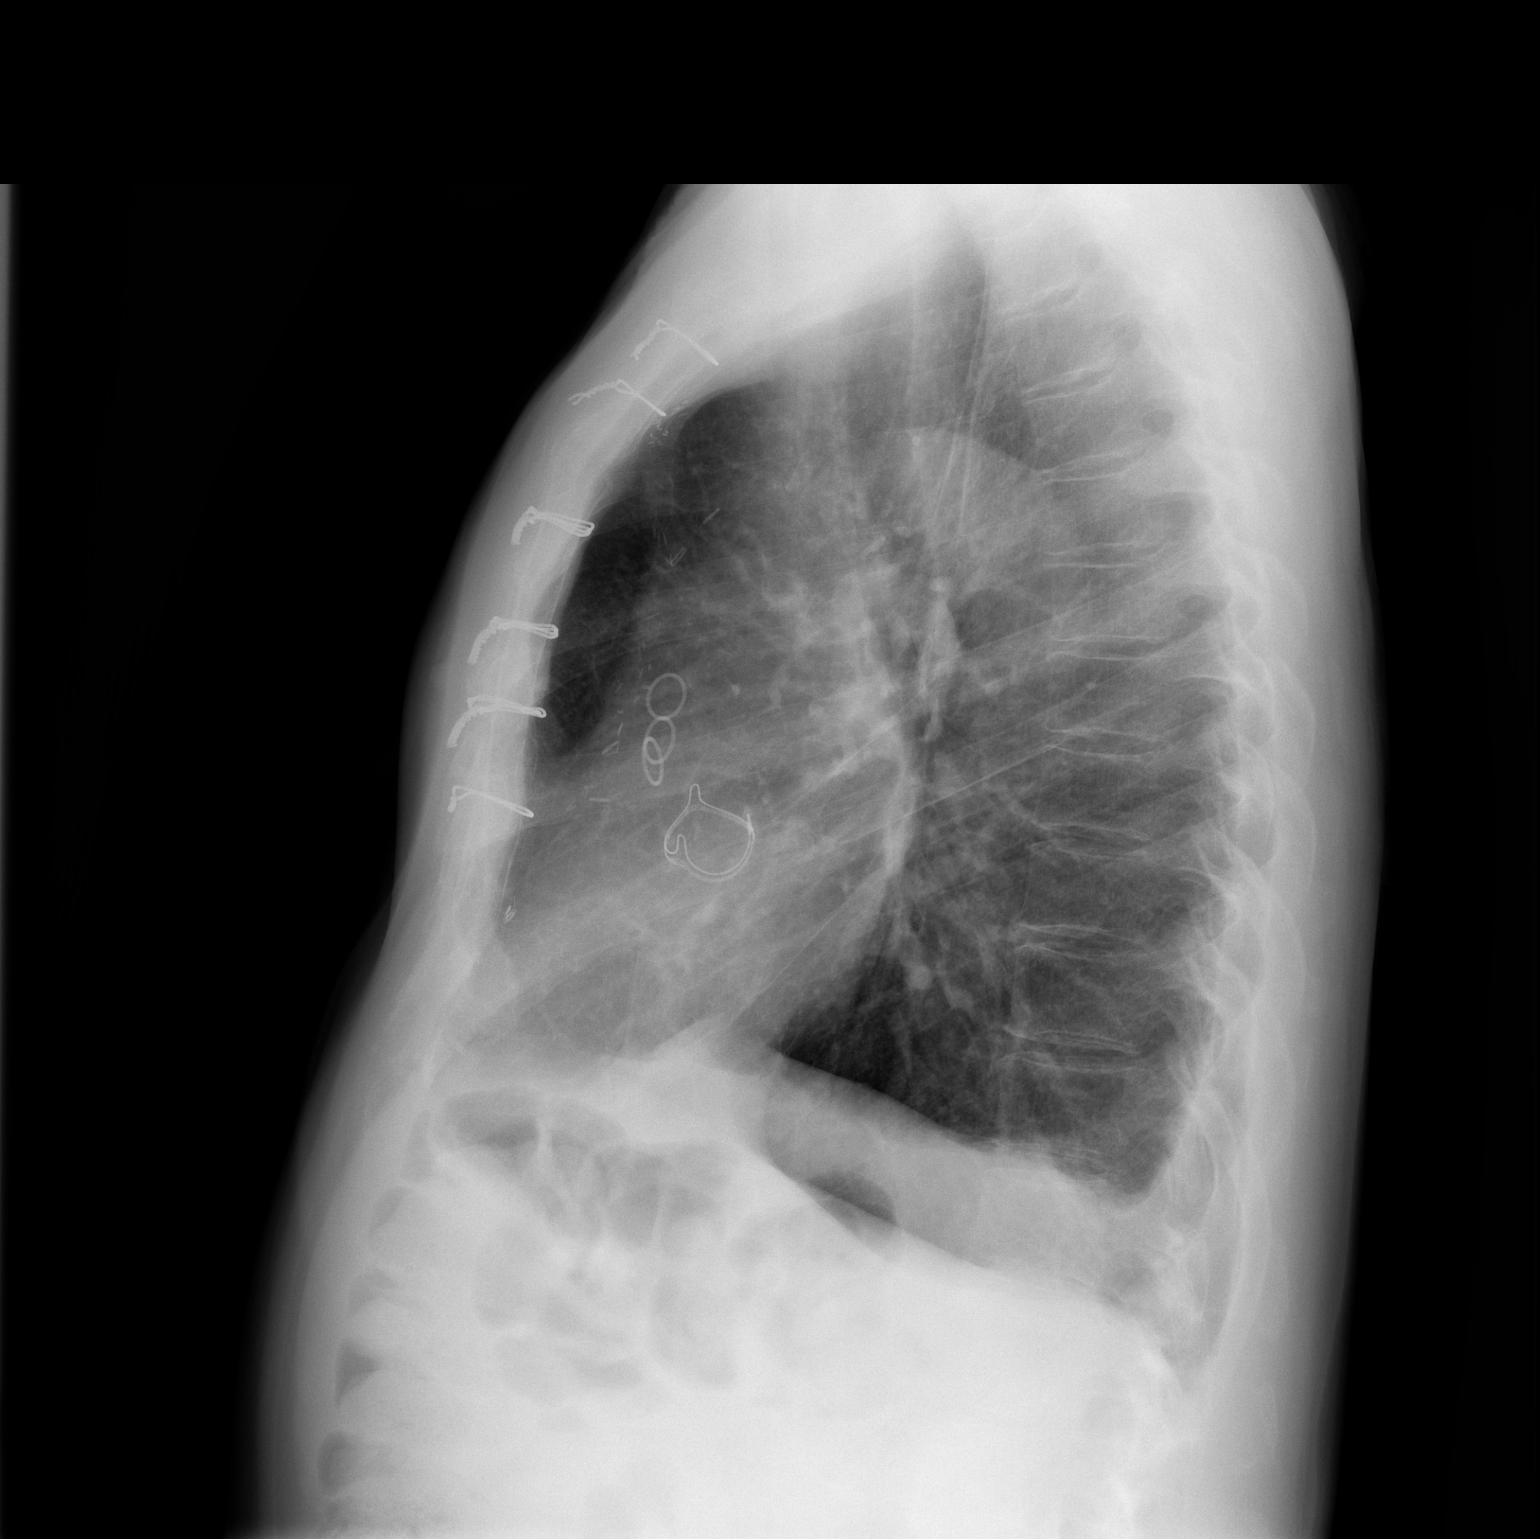

[2 of 2 positions shown; findings below may reference images not displayed]

FINDINGS: Heart size normal. Left pleural effusion continues to decrease.
Residual airspace opacities are present at the left base, likely
atelectasis or potentially scarring. The right is clear. CABG and
aortic valve replacement noted.
IMPRESSION: 1. Continued decrease in left pleural effusion.
2. Residual airspace disease at the left base, likely atelectasis or
potentially scarring.

## 2021-10-30 DIAGNOSIS — E039 Hypothyroidism, unspecified: Secondary | ICD-10-CM | POA: Diagnosis not present

## 2021-11-06 ENCOUNTER — Ambulatory Visit
Admission: RE | Admit: 2021-11-06 | Discharge: 2021-11-06 | Disposition: A | Discharge status: Home / Self Care | Payer: Medicare Other | Source: Ambulatory Visit | Attending: Acute Care | Admitting: Acute Care

## 2021-11-06 ENCOUNTER — Other Ambulatory Visit: Payer: Medicare Other

## 2021-11-06 DIAGNOSIS — Z87891 Personal history of nicotine dependence: Secondary | ICD-10-CM | POA: Diagnosis not present

## 2021-11-08 ENCOUNTER — Other Ambulatory Visit: Payer: Self-pay | Admitting: Acute Care

## 2021-11-08 DIAGNOSIS — Z122 Encounter for screening for malignant neoplasm of respiratory organs: Secondary | ICD-10-CM

## 2021-11-08 DIAGNOSIS — Z87891 Personal history of nicotine dependence: Secondary | ICD-10-CM

## 2022-01-02 DIAGNOSIS — H11042 Peripheral pterygium, stationary, left eye: Secondary | ICD-10-CM | POA: Diagnosis not present

## 2022-01-13 DIAGNOSIS — H11009 Unspecified pterygium of unspecified eye: Secondary | ICD-10-CM | POA: Diagnosis not present

## 2022-01-13 DIAGNOSIS — R110 Nausea: Secondary | ICD-10-CM | POA: Diagnosis not present

## 2022-01-13 DIAGNOSIS — M79643 Pain in unspecified hand: Secondary | ICD-10-CM | POA: Diagnosis not present

## 2022-01-13 DIAGNOSIS — E1169 Type 2 diabetes mellitus with other specified complication: Secondary | ICD-10-CM | POA: Diagnosis not present

## 2022-01-13 DIAGNOSIS — E039 Hypothyroidism, unspecified: Secondary | ICD-10-CM | POA: Diagnosis not present

## 2022-01-15 DIAGNOSIS — Z23 Encounter for immunization: Secondary | ICD-10-CM | POA: Diagnosis not present

## 2022-01-22 ENCOUNTER — Telehealth: Payer: Self-pay | Admitting: Cardiology

## 2022-01-22 DIAGNOSIS — G5601 Carpal tunnel syndrome, right upper limb: Secondary | ICD-10-CM | POA: Diagnosis not present

## 2022-01-22 NOTE — Telephone Encounter (Signed)
Steven Perla, MD  You2 minutes ago (3:49 PM)    Agree [facility]   Returned call to patient and made patient aware of Dr. Jacalyn Lefevre advice. Advised patient to call back to office with any issues, questions, or concerns. Patient verbalized understanding.

## 2022-01-22 NOTE — Telephone Encounter (Signed)
Pt c/o of Chest Pain: STAT if CP now or developed within 24 hours  1. Are you having CP right now? No, pain is on the left side of his chest.   2. Are you experiencing any other symptoms (ex. SOB, nausea, vomiting, sweating)? no  3. How long have you been experiencing CP? First episode was about 45 mins ago, then last one was about 10 mins ago they last about 10 seconds.   4. Is your CP continuous or coming and going? Comes and goes.   5. Have you taken Nitroglycerin? No, doesn't have any  ?

## 2022-01-22 NOTE — Telephone Encounter (Signed)
Received stat call from patient into triage with complaint of chest pain. Patient states that he received a cortisone shot into his wrist today and reports that he had two instances of chest pain after returning home. Patient states that pain was very sharp and in the left side of his chest and reports that it lasted around 10-15 seconds each time. Patient states that he was not exerting himself when pain occurred. Patient denies any other symptoms and reports he overall feels well. Patient states the pain does not feel like it did prior to his CABG. Advised patient that this did not sound cardiac related, but if patient persisted to let us know. Made patient aware of ED precautions should new or worsening symptoms develop. Patient verbalized understanding.   Will forward to Dr. Stanford Breed for review.

## 2022-02-25 NOTE — Progress Notes (Signed)
HPI: Follow-up coronary artery disease.  Patient hospitalized with non-ST elevation myocardial infarction October 2021.  Echocardiogram showed normal LV function, mild left ventricular hypertrophy, moderate aortic stenosis.  Cardiac catheterization showed an occluded RCA, 95% circumflex and 80% LAD.  Carotid Doppler showed 1 to 39% bilateral stenosis.  Patient subsequently had coronary artery bypass and graft with LIMA to the LAD, saphenous vein graft to the first diagonal, sequential saphenous vein graft to OM 2/OM 3 and saphenous vein graft to the PDA; aortic valve replacement with bovine pericardial valve. Course complicated by atrial fibrillation treated with amiodarone and apixaban.  Follow-up echocardiogram February 08, 2020 showed normal LV function, moderate left ventricular hypertrophy, grade 2 diastolic dysfunction, previous aortic valve replacement with mean gradient 13 mmHg and no aortic insufficiency.  Abdominal ultrasound December 2021 showed no aneurysm. Since last seen patient does have occasional chest pain.  It is in the right substernal area without radiation.  It occurs with activities and is relieved with rest.  It is unlike his previous infarct pain.  He denies dyspnea or syncope.  Current Outpatient Medications  Medication Sig Dispense Refill   aspirin EC 81 MG EC tablet Take 1 tablet (81 mg total) by mouth daily. Swallow whole. 30 tablet 11   atorvastatin (LIPITOR) 80 MG tablet Take 80 mg by mouth daily.      canagliflozin (INVOKANA) 100 MG TABS tablet Take 300 mg by mouth daily.     cholestyramine light (PREVALITE) 4 GM/DOSE powder Take 1 g by mouth daily as needed (For stomach).     doxazosin (CARDURA) 2 MG tablet Take 2 mg by mouth every morning. Takes for urinary freq     insulin degludec (TRESIBA) 100 UNIT/ML FlexTouch Pen Inject 100 Units into the skin at bedtime.     levothyroxine (SYNTHROID, LEVOTHROID) 50 MCG tablet Take 75 mcg by mouth daily before breakfast.       lisinopril (ZESTRIL) 5 MG tablet Take 5 mg by mouth daily.     metFORMIN (GLUCOPHAGE) 1000 MG tablet Take 1,000 mg by mouth 2 (two) times daily with a meal.     pramipexole (MIRAPEX) 0.125 MG tablet Take 0.125 mg by mouth as directed. x4 RLS at Bedtime     No current facility-administered medications for this visit.     Past Medical History:  Diagnosis Date   Acid reflux    Arthritis    BPH (benign prostatic hyperplasia)    mild   CAD in native artery    a. NSTEMI 12/2019 s/p CABGx5 with bovine AVR.   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, RDI 10.2/hr ([name])   Urinary frequency    Wears glasses    Wears hearing aid    both ears    Past Surgical History:  Procedure Laterality Date   ANTERIOR LAT LUMBAR FUSION Left 01/04/2015   Procedure: ANTERIOR LATERAL LUMBAR FUSION 1 LEVEL;  Surgeon: Phylliss Bob, MD;  Location: [place];  Service: Orthopedics;  Laterality: Left;  Left sided lateral interbody fusion lumbar 3-4 with instrumentation and allograft   AORTIC VALVE  REPLACEMENT N/A 12/21/2019   Procedure: AORTIC VALVE REPLACEMENT WITH INSPIRIS RESILIA AORTIC VALVE SIZE 23MM;  Surgeon: Melrose Nakayama, MD;  Location: [place];  Service: Open Heart Surgery;  Laterality: N/A;   COLONOSCOPY     CORONARY ARTERY BYPASS GRAFT N/A 12/21/2019   Procedure: CORONARY ARTERY BYPASS GRAFTING TIMES FIVE USING LEFT INTERNAL [employer] ARTERY AND ENDOSCOPICALLY HARVESTED RIGHT GREATER SAPHENOUS VEIN.;  Surgeon: Melrose Nakayama, MD;  Location: [place];  Service: Open Heart Surgery;  Laterality: N/A;   ENDOVEIN HARVEST OF GREATER SAPHENOUS VEIN  12/21/2019    Procedure: ENDOVEIN HARVEST OF RIGHT GREATER SAPHENOUS VEIN;  Surgeon: Melrose Nakayama, MD;  Location: [place];  Service: Open Heart Surgery;;   ESOPHAGOGASTRODUODENOSCOPY     INGUINAL HERNIA REPAIR Right 08/12/2012   Procedure: HERNIA REPAIR INGUINAL ADULT;  Surgeon: Imogene Burn. Georgette Dover, MD;  Location: [place];  Service: General;  Laterality: Right;   INSERTION OF MESH Right 08/12/2012   Procedure: INSERTION OF MESH;  Surgeon: Imogene Burn. Georgette Dover, MD;  Location: [facility];  Service: General;  Laterality: Right;   LEFT HEART CATH AND CORONARY ANGIOGRAPHY N/A 12/19/2019   Procedure: LEFT HEART CATH AND CORONARY ANGIOGRAPHY;  Surgeon: Lorretta Harp, MD;  Location: [facility];  Service: Cardiovascular;  Laterality: N/A;   TEE WITHOUT CARDIOVERSION N/A 12/21/2019   Procedure: TRANSESOPHAGEAL ECHOCARDIOGRAM (TEE);  Surgeon: Melrose Nakayama, MD;  Location: [place];  Service: Open Heart Surgery;  Laterality: N/A;   TONSILLECTOMY     VASECTOMY  1985    Social History   Socioeconomic History   Marital status: Married    Spouse name: Not on file   Number of children: Not on file   Years of education: Not on file   Highest education level: Not on file  Occupational History   Not on file  Tobacco Use   Smoking status: Former    Packs/day: 1.00    Years: 53.00    Total pack years: 53.00    Types: Cigarettes    Quit date: 12/16/2019    Years since quitting: 2.2   Smokeless tobacco: Never  Substance and Sexual Activity   Alcohol use: Yes    Comment: occ   Drug use: No   Sexual activity: Not on file  Other Topics Concern   Not on file  Social History Narrative   Not on file   Social Determinants of Health   Financial Resource Strain: Not on file  Food Insecurity: Not on file  Transportation Needs: Not on file  Physical Activity: Not on file  Stress: Not on file  Social Connections: Not on file  Intimate Partner Violence: Not on file     Family History  Problem Relation Age of Onset   Diabetes Mother    Heart disease Mother    Parkinson's disease Mother    Heart disease Father    Cancer Father        melonma/Lung   Diabetes Sister    Diabetes Brother    Heart disease Brother    Multiple sclerosis Daughter     ROS: no fevers or chills, productive cough, hemoptysis, dysphasia, odynophagia, melena, hematochezia, dysuria, hematuria, rash, seizure activity, orthopnea, PND, pedal edema, claudication. Remaining systems are negative.  Physical Exam: Well-developed well-nourished in no acute distress.  Skin is warm and dry.  HEENT is normal.  Neck is supple.  Chest is clear to auscultation with normal expansion.  Cardiovascular exam is regular  rate and rhythm.  2/6 systolic murmur left sternal border. Abdominal exam nontender or distended. No masses palpated. Extremities show no edema. neuro grossly intact  ECG-normal sinus rhythm at a rate of 69, left axis deviation.  Personally reviewed  A/P  1 coronary artery disease-Continue aspirin and statin.  2 hyperlipidemia-continue statin.  Lipids and liver followed by her primary care.  3 status post aortic valve replacement-continue SBE prophylaxis.  4 chest pain-patient has some chest pain with activities relieved with rest concerning for potential obstructive coronary disease.  We will add isosorbide 30 mg daily.  I will arrange a stress PET scan to screen for ischemia.  [name], MD

## 2022-03-05 DIAGNOSIS — G5601 Carpal tunnel syndrome, right upper limb: Secondary | ICD-10-CM | POA: Diagnosis not present

## 2022-03-07 ENCOUNTER — Encounter: Payer: Self-pay | Admitting: Cardiology

## 2022-03-07 ENCOUNTER — Ambulatory Visit: Payer: Medicare Other | Attending: Cardiology | Admitting: Cardiology

## 2022-03-07 VITALS — BP 128/80 | HR 69 | Ht 69.0 in | Wt 169.4 lb

## 2022-03-07 DIAGNOSIS — I2510 Atherosclerotic heart disease of native coronary artery without angina pectoris: Secondary | ICD-10-CM | POA: Diagnosis not present

## 2022-03-07 DIAGNOSIS — R072 Precordial pain: Secondary | ICD-10-CM | POA: Insufficient documentation

## 2022-03-07 DIAGNOSIS — Z952 Presence of prosthetic heart valve: Secondary | ICD-10-CM | POA: Diagnosis not present

## 2022-03-07 MED ORDER — ISOSORBIDE MONONITRATE ER 30 MG PO TB24: 30.0000 mg | ORAL_TABLET | Freq: Every day | ORAL | 3 refills | Status: DC

## 2022-03-07 NOTE — Patient Instructions (Signed)
Medication Instructions:   START ISOSORBIDE 30 MG ONCE DAILY  *If you need a refill on your cardiac medications before your next appointment, please call your pharmacy*   Testing/Procedures: How to Prepare for Your Cardiac PET/CT Stress Test:  1. Please do not take these medications before your test:   TAKE ALL MEDICATIONS WITH SIPS OF WATER  2. Nothing to eat or drink, except water, 3 hours prior to arrival time.   NO caffeine/decaffeinated products, or chocolate 12 hours prior to arrival.  3. NO perfume, cologne or lotion  4. Total time is 1 to 2 hours; you may want to bring reading material for the waiting time.  5. Please report to Admitting at the [facility] Main Entrance 30 minutes early for your test.  [place], [place] [address]  Diabetic Preparation:  Hold oral medications. You may take NPH and Lantus insulin. Do not take Humalog or Humulin R (Regular Insulin) the day of your test. Check blood sugars prior to leaving the house. If able to eat breakfast prior to 3 hour fasting, you may take all medications, including your insulin, Do not worry if you miss your breakfast dose of insulin - start at your next meal.  IF YOU THINK YOU MAY BE PREGNANT, OR ARE NURSING PLEASE INFORM THE TECHNOLOGIST.  In preparation for your appointment, medication and supplies will be purchased.  Appointment availability is limited, so if you need to cancel or reschedule, please call the Radiology Department at [phone]  24 hours in advance to avoid a cancellation fee of $100.00  What to Expect After you Arrive:  Once you arrive and check in for your appointment, you will be taken to a preparation room within the Radiology Department.  A technologist or Nurse will obtain your medical history, verify that you are correctly prepped for the exam, and explain the procedure.  Afterwards,  an IV will be started in your arm and electrodes will be placed on your skin for  EKG monitoring during the stress portion of the exam. Then you will be escorted to the PET/CT scanner.  There, staff will get you positioned on the scanner and obtain a blood pressure and EKG.  During the exam, you will continue to be connected to the EKG and blood pressure machines.  A small, safe amount of a radioactive tracer will be injected in your IV to obtain a series of pictures of your heart along with an injection of a stress agent.    After your Exam:  It is recommended that you eat a meal and drink a caffeinated beverage to counter act any effects of the stress agent.  Drink plenty of fluids for the remainder of the day and urinate frequently for the first couple of hours after the exam.  Your doctor will inform you of your test results within 7-10 business days.  For questions about your test or how to prepare for your test, please call: Marchia Bond, Cardiac Imaging Nurse Navigator  Gordy Clement, Cardiac Imaging Nurse Navigator Office: [phone]    Follow-Up: At [facility], you and your health needs are our priority.  As part of our continuing mission to provide you with exceptional heart care, we have created designated Provider Care Teams.  These Care Teams include your primary Cardiologist (physician) and Advanced Practice Providers (APPs -  Physician Assistants and Nurse Practitioners) who all work together to provide you with the care you need, when you need it.  We recommend signing up for the patient portal called "MyChart".  Sign up information is provided on this After Visit Summary.  MyChart is used to connect with patients for Virtual Visits (Telemedicine).  Patients are able to view lab/test results, encounter notes, upcoming appointments, etc.  Non-urgent messages can be sent to your provider as well.   To learn more about what you can do with MyChart, go to [email or web address].    Your next appointment:    AFTER PET SCAN COMPLETE

## 2022-03-13 DIAGNOSIS — Z125 Encounter for screening for malignant neoplasm of prostate: Secondary | ICD-10-CM | POA: Diagnosis not present

## 2022-03-13 DIAGNOSIS — D6869 Other thrombophilia: Secondary | ICD-10-CM | POA: Diagnosis not present

## 2022-03-13 DIAGNOSIS — Z952 Presence of prosthetic heart valve: Secondary | ICD-10-CM | POA: Diagnosis not present

## 2022-03-13 DIAGNOSIS — E039 Hypothyroidism, unspecified: Secondary | ICD-10-CM | POA: Diagnosis not present

## 2022-03-13 DIAGNOSIS — Z0000 Encounter for general adult medical examination without abnormal findings: Secondary | ICD-10-CM | POA: Diagnosis not present

## 2022-03-13 DIAGNOSIS — E7800 Pure hypercholesterolemia, unspecified: Secondary | ICD-10-CM | POA: Diagnosis not present

## 2022-03-13 DIAGNOSIS — E1169 Type 2 diabetes mellitus with other specified complication: Secondary | ICD-10-CM | POA: Diagnosis not present

## 2022-03-13 DIAGNOSIS — G4730 Sleep apnea, unspecified: Secondary | ICD-10-CM | POA: Diagnosis not present

## 2022-03-13 DIAGNOSIS — N400 Enlarged prostate without lower urinary tract symptoms: Secondary | ICD-10-CM | POA: Diagnosis not present

## 2022-03-13 DIAGNOSIS — I700 Atherosclerosis of aorta: Secondary | ICD-10-CM | POA: Diagnosis not present

## 2022-03-13 DIAGNOSIS — R911 Solitary pulmonary nodule: Secondary | ICD-10-CM | POA: Diagnosis not present

## 2022-03-13 DIAGNOSIS — I2510 Atherosclerotic heart disease of native coronary artery without angina pectoris: Secondary | ICD-10-CM | POA: Diagnosis not present

## 2022-03-18 ENCOUNTER — Telehealth: Payer: Self-pay | Admitting: *Deleted

## 2022-03-18 DIAGNOSIS — Z952 Presence of prosthetic heart valve: Secondary | ICD-10-CM

## 2022-03-18 NOTE — Telephone Encounter (Signed)
   Pre-operative Risk Assessment    Patient Name: Steven Lawrence  DOB: 01-Jun-1950 MRN: [id number]     Request for Surgical Clearance    Procedure:   1 TOOTH TO BE EXTRACTED  Date of Surgery:  Clearance TBD                                 Surgeon:   DR. [facility] Surgeon's Group or Practice Name:  [facility], [place] Phone number:  [phone] Fax number:  [phone]   Type of Clearance Requested:   - Medical ; ASA    Type of Anesthesia:  Local ; [place]; WILL THIS BE OK   Additional requests/questions:    [name]   03/18/2022, 4:17 PM

## 2022-03-20 MED ORDER — AMOXICILLIN 500 MG PO TABS: ORAL_TABLET | ORAL | 1 refills | Status: DC

## 2022-03-20 NOTE — Telephone Encounter (Signed)
Dr. Stanford Breed I'm working in preop today.  This patient has a history of CABG with tissue AVR. You saw him on 03/07/22 and he reported symptoms concerning for angina and you recommended a PET stress test.   He is now pending a single tooth extraction with possible IV sedation. We typically would not need clearance for single tooth extraction. However, in review of his chart, he was having CP and does not appear that the PET stress has been scheduled.  Given his AVR, tooth extraction may need to be treated as a more urgent procedure (PET stress is scheduling out). Do you recommend proceeding prior to completion of PET stress? Possibly without IV sedation? PET has not yet been scheduled.   Preop - will need SBE PPX

## 2022-03-20 NOTE — Telephone Encounter (Signed)
   Patient Name: Steven Lawrence  DOB: [birth year] MRN: [id number]  Primary Cardiologist: [name], MD  Chart reviewed as part of pre-operative protocol coverage.   Dental extractions (i.e. 1-2 teeth) are considered low risk procedures per guidelines and generally do not require any specific cardiac clearance. It is also generally accepted that for extractions or 1 or 2 teeth and dental cleanings, there is no need to interrupt blood thinner therapy.  Given that this patient does have an ischemic evaluation pending, I reached out to his primary cardiologist who agree that he may proceed without completing outstanding stress test.  SBE prophylaxis is required for the patient from a cardiac standpoint. I have sent 2g amoxicillin to his home pharmacy to be taken 30-60 min prior to dental procedures.  I will route this recommendation to the requesting party via Epic fax function and remove from pre-op pool.  Please call with questions.  Tami Lin Nour Rodrigues, PA 03/20/2022, 11:00 AM

## 2022-03-20 NOTE — Telephone Encounter (Signed)
I s/w the pt and he has been made aware cleared for the dental work and that SBE has been called in today. See notes from Fabian Sharp, [facility].   ABX: Amoxicillin 2 g; take all 4 tablets 30-60 minutes prior to dental procedure.   I assured the pt that I will update the requesting office. Pt said thank you.

## 2022-03-27 ENCOUNTER — Other Ambulatory Visit: Payer: Self-pay | Admitting: *Deleted

## 2022-03-27 DIAGNOSIS — R072 Precordial pain: Secondary | ICD-10-CM

## 2022-03-28 ENCOUNTER — Telehealth (HOSPITAL_COMMUNITY): Payer: Self-pay | Admitting: *Deleted

## 2022-03-28 NOTE — Telephone Encounter (Signed)
Reaching out to patient to offer assistance regarding upcoming cardiac imaging study; pt verbalizes understanding of appt date/time, parking situation and where to check in, pre-test NPO status and verified current allergies; name and call back number provided for further questions should they arise  Gordy Clement RN Navigator Cardiac Imaging [facility] [phone] office [phone] cell  Patient aware to avoid caffeine and Imdur prior to his cardiac PET scan.

## 2022-04-01 ENCOUNTER — Encounter (HOSPITAL_COMMUNITY)
Admission: RE | Admit: 2022-04-01 | Discharge: 2022-04-01 | Disposition: A | Discharge status: Home / Self Care | Payer: Medicare Other | Source: Ambulatory Visit | Attending: Cardiology | Admitting: Cardiology

## 2022-04-01 DIAGNOSIS — I2510 Atherosclerotic heart disease of native coronary artery without angina pectoris: Secondary | ICD-10-CM | POA: Diagnosis not present

## 2022-04-01 DIAGNOSIS — Z952 Presence of prosthetic heart valve: Secondary | ICD-10-CM

## 2022-04-01 DIAGNOSIS — R072 Precordial pain: Secondary | ICD-10-CM | POA: Insufficient documentation

## 2022-04-01 LAB — NM PET CT CARDIAC PERFUSION MULTI W/ABSOLUTE BLOODFLOW
Nuc Rest EF: 58 %
Nuc Stress EF: 50 %
TID: 1.33

## 2022-04-01 MED ORDER — RUBIDIUM RB82 GENERATOR (RUBYFILL)
20.9000 | PACK | Freq: Once | INTRAVENOUS | Status: AC
  Administered 2022-04-01: 20.9 via INTRAVENOUS

## 2022-04-01 MED ORDER — REGADENOSON 0.4 MG/5ML IV SOLN
INTRAVENOUS | Status: AC
  Filled 2022-04-01: qty 5

## 2022-04-01 MED ORDER — REGADENOSON 0.4 MG/5ML IV SOLN
0.4000 mg | Freq: Once | INTRAVENOUS | Status: AC
  Administered 2022-04-01: 0.4 mg via INTRAVENOUS

## 2022-04-01 MED ORDER — RUBIDIUM RB82 GENERATOR (RUBYFILL)
19.9000 | PACK | Freq: Once | INTRAVENOUS | Status: AC
  Administered 2022-04-01: 19.9 via INTRAVENOUS

## 2022-04-04 NOTE — H&P (View-Only) (Signed)
Cardiology Clinic Note   Patient Name: Steven Lawrence Date of Encounter: 04/07/2022  Primary Care Provider:  Lennie Odor, [place] Primary Cardiologist:  [name], MD  Patient Profile    Steven Lawrence 72 year old male presents to the clinic today for follow-up evaluation of his essential hypertension and follow-up for PET/CT cardiac perfusion study.  Past Medical History    Past Medical History:  Diagnosis Date   Acid reflux    Arthritis    BPH (benign prostatic hyperplasia)    mild   CAD in native artery    a. NSTEMI 12/2019 s/p CABGx5 with bovine AVR.   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   History of pneumonia as a child    Hypercholesteremia    Hypothyroidism    RLS (restless legs syndrome)    S/P aortic valve replacement with bioprosthetic valve    S/P CABG (coronary artery bypass graft)    Sleep apnea    does not use a cpap,primarily with upper airway resistance syndrome AHI 2.35/hr, RDI 10.2/hr ([name])   Urinary frequency    Wears glasses    Wears hearing aid    both ears   Past Surgical History:  Procedure Laterality Date   ANTERIOR LAT LUMBAR FUSION Left 01/04/2015   Procedure: ANTERIOR LATERAL LUMBAR FUSION 1 LEVEL;  Surgeon: Phylliss Bob, MD;  Location: [facility];  Service: Orthopedics;  Laterality: Left;  Left sided lateral interbody fusion lumbar 3-4 with instrumentation and allograft   AORTIC VALVE REPLACEMENT N/A 12/21/2019   Procedure: AORTIC VALVE REPLACEMENT WITH INSPIRIS RESILIA AORTIC VALVE SIZE 23MM;  Surgeon: Melrose Nakayama, MD;  Location: [facility] [facility];  Service: Open Heart Surgery;  Laterality: N/A;   COLONOSCOPY     CORONARY ARTERY BYPASS GRAFT N/A 12/21/2019   Procedure: CORONARY ARTERY BYPASS GRAFTING TIMES FIVE USING LEFT INTERNAL MAMMART ARTERY AND ENDOSCOPICALLY HARVESTED RIGHT GREATER SAPHENOUS VEIN.;  Surgeon: Melrose Nakayama, MD;  Location: [facility];  Service: Open Heart Surgery;  Laterality:  N/A;   ENDOVEIN HARVEST OF GREATER SAPHENOUS VEIN  12/21/2019   Procedure: ENDOVEIN HARVEST OF RIGHT GREATER SAPHENOUS VEIN;  Surgeon: Melrose Nakayama, MD;  Location: [facility];  Service: Open Heart Surgery;;   ESOPHAGOGASTRODUODENOSCOPY     INGUINAL HERNIA REPAIR Right 08/12/2012   Procedure: HERNIA REPAIR INGUINAL ADULT;  Surgeon: Imogene Burn. Georgette Dover, MD;  Location: [facility];  Service: General;  Laterality: Right;   INSERTION OF MESH Right 08/12/2012   Procedure: INSERTION OF MESH;  Surgeon: Imogene Burn. Georgette Dover, MD;  Location: [place];  Service: General;  Laterality: Right;   LEFT HEART CATH AND CORONARY ANGIOGRAPHY N/A 12/19/2019   Procedure: LEFT HEART CATH AND CORONARY ANGIOGRAPHY;  Surgeon: Lorretta Harp, MD;  Location: [facility];  Service: Cardiovascular;  Laterality: N/A;   TEE WITHOUT CARDIOVERSION N/A 12/21/2019   Procedure: TRANSESOPHAGEAL ECHOCARDIOGRAM (TEE);  Surgeon: Melrose Nakayama, MD;  Location: [facility];  Service: Open Heart Surgery;  Laterality: N/A;   TONSILLECTOMY     VASECTOMY  1985    Allergies  No Known Allergies  History of Present Illness    Steven Lawrence has a PMH of aortic valve replacement, hyperlipidemia, type 2 diabetes, and coronary artery disease status post CABG x 5 on 10/21.  He was hospitalized with NSTEMI 10/21.  His echocardiogram at that time showed normal LV function mild left ventricular hypertrophy, moderate aortic  stenosis.  He underwent cardiac catheterization which showed occluded RCA, 95% circumflex, and 80% LAD.  His bilateral carotid Doppler showed 1-39% stenosis.  He underwent coronary artery bypass grafting LIMA-LAD, SVG-first diagonal, SVG-OM 2/OM 3, and SVG-PDA along with aortic valve replacement (bovine).  His course was complicated by atrial fibrillation.  He was treated with amiodarone and apixaban.  His follow-up echocardiogram 11/21 showed normal LV function, moderate left ventricular  hypertrophy, G2 DD, aortic valve gradient 13 mmHg and well-functioning aortic valve.  He had abdominal ultrasound 12/21 which showed no aneurysm.  He was seen in follow-up by Dr. Stanford Breed on 03/06/2022.  During that time he did note occasional chest discomfort.  He had some right substernal discomfort without radiation.  His discomfort was relieved with rest.  He reported it was unlike his previous anginal equivalent pain.  He denied dyspnea and syncope.  He underwent nuclear medicine PET/CT cardiac perfusion study which showed high risk and reversible ischemia.  He presents to the clinic today for review of the study.  He states he notices chest discomfort with increased physical activity.  He denies chest discomfort with normal daily activities.  He was previously diagnosed with OSA and was CPAP intolerant.  He reports that his OSA was mild at that time.  We reviewed his cardiac CT and he expressed understanding.  I will order a CBC, BMP and plan follow-up for after his catheterization.  Today he denies shortness of breath, lower extremity edema, fatigue, palpitations, melena, hematuria, hemoptysis, diaphoresis, weakness, presyncope, syncope, orthopnea, and PND.   Home Medications    Prior to Admission medications   Medication Sig Start Date End Date Taking? Authorizing Provider  amoxicillin (AMOXIL) 500 MG tablet Take 4 tablets (2000 mg) at once 30-60 min prior to dental procedures. 03/20/22   Ledora Bottcher, PA  aspirin EC 81 MG EC tablet Take 1 tablet (81 mg total) by mouth daily. Swallow whole. 12/27/19   Antony Odea, PA-C  atorvastatin (LIPITOR) 80 MG tablet Take 80 mg by mouth daily.  07/23/12   [provider]  canagliflozin (INVOKANA) 100 MG TABS tablet Take 300 mg by mouth daily.    [provider]  cholestyramine light (PREVALITE) 4 GM/DOSE powder Take 1 g by mouth daily as needed (For stomach).    [provider]  doxazosin (CARDURA) 2 MG tablet  Take 2 mg by mouth every morning. Takes for urinary freq 07/27/12   [provider]  insulin degludec (TRESIBA) 100 UNIT/ML FlexTouch Pen Inject 100 Units into the skin at bedtime. 01/15/22   [provider]  isosorbide mononitrate (IMDUR) 30 MG 24 hr tablet Take 1 tablet (30 mg total) by mouth daily. 03/07/22   Lelon Perla, MD  levothyroxine (SYNTHROID, LEVOTHROID) 50 MCG tablet Take 75 mcg by mouth daily before breakfast.     [provider]  lisinopril (ZESTRIL) 5 MG tablet Take 5 mg by mouth daily. 02/21/20   [provider]  metFORMIN (GLUCOPHAGE) 1000 MG tablet Take 1,000 mg by mouth 2 (two) times daily with a meal.    [provider]  pramipexole (MIRAPEX) 0.125 MG tablet Take 0.125 mg by mouth as directed. x4 RLS at Bedtime    [provider]    Family History    Family History  Problem Relation Age of Onset   Diabetes Mother    Heart disease Mother    Parkinson's disease Mother    Heart disease Father    Cancer Father  melonma/Lung   Diabetes Sister    Diabetes Brother    Heart disease Brother    Multiple sclerosis Daughter    He indicated that his mother is deceased. He indicated that his father is deceased. He indicated that both of his sisters are alive. He indicated that his brother is deceased. He indicated that his daughter is alive.  Social History    Social History   Socioeconomic History   Marital status: Married    Spouse name: Not on file   Number of children: Not on file   Years of education: Not on file   Highest education level: Not on file  Occupational History   Not on file  Tobacco Use   Smoking status: Former    Packs/day: 1.00    Years: 53.00    Total pack years: 53.00    Types: Cigarettes    Quit date: 12/16/2019    Years since quitting: 2.3   Smokeless tobacco: Never  Substance and Sexual Activity   Alcohol use: Yes    Comment: occ   Drug use: No   Sexual activity: Not on  file  Other Topics Concern   Not on file  Social History Narrative   Not on file   Social Determinants of Health   Financial Resource Strain: Not on file  Food Insecurity: Not on file  Transportation Needs: Not on file  Physical Activity: Not on file  Stress: Not on file  Social Connections: Not on file  Intimate Partner Violence: Not on file     Review of Systems    General:  No chills, fever, night sweats or weight changes.  Cardiovascular:  No chest pain, dyspnea on exertion, edema, orthopnea, palpitations, paroxysmal nocturnal dyspnea. Dermatological: No rash, lesions/masses Respiratory: No cough, dyspnea Urologic: No hematuria, dysuria Abdominal:   No nausea, vomiting, diarrhea, bright red blood per rectum, melena, or hematemesis Neurologic:  No visual changes, wkns, changes in mental status. All other systems reviewed and are otherwise negative except as noted above.  Physical Exam    VS:  BP 126/64   Pulse 70   Wt 164 lb 12.8 oz (74.8 kg)   SpO2 97%   BMI 24.34 kg/m  , BMI Body mass index is 24.34 kg/m. GEN: Well nourished, well developed, in no acute distress. HEENT: normal. Neck: Supple, no JVD, carotid bruits, or masses. Cardiac: RRR, no murmurs, rubs, or gallops. No clubbing, cyanosis, edema.  Radials/DP/PT 2+ and equal bilaterally.  Respiratory:  Respirations regular and unlabored, clear to auscultation bilaterally. GI: Soft, nontender, nondistended, BS + x 4. MS: no deformity or atrophy. Skin: warm and dry, no rash. Neuro:  Strength and sensation are intact. Psych: Normal affect.  Accessory Clinical Findings    Recent Labs: No results found for requested labs within last 365 days.   Recent Lipid Panel    Component Value Date/Time   CHOL 115 09/05/2020 0943   TRIG 48 09/05/2020 0943   HDL 55 09/05/2020 0943   CHOLHDL 2.1 09/05/2020 0943   CHOLHDL 2.5 12/20/2019 0537   VLDL 9 12/20/2019 0537   LDLCALC 48 09/05/2020 0943         ECG  personally reviewed by me today-none today.  Echocardiogram 02/08/2020  IMPRESSIONS     1. Left ventricular ejection fraction, by estimation, is 60 to 65%. The  left ventricle has normal function. The left ventricle has no regional  wall motion abnormalities. There is moderate left ventricular hypertrophy.  Left ventricular diastolic  parameters are consistent with Grade II diastolic dysfunction  (pseudonormalization). Elevated left atrial pressure.   2. Right ventricular systolic function is normal. The right ventricular  size is normal. Tricuspid regurgitation signal is inadequate for assessing  PA pressure.   3. The mitral valve is normal in structure. Trivial mitral valve  regurgitation.   4. There is a 23 mm Inspiris Resilia valve present in the aortic  position. Aortic valve regurgitation is not visualized. MG 13 mmHg. EOA  1.6 cm^2. Echo findings are consistent with normal structure and function  of the aortic valve prosthesis.   5. The inferior vena cava is normal in size with greater than 50%  respiratory variability, suggesting right atrial pressure of 3 mmHg.   FINDINGS   Left Ventricle: Left ventricular ejection fraction, by estimation, is 60  to 65%. The left ventricle has normal function. The left ventricle has no  regional wall motion abnormalities. The left ventricular internal cavity  size was small. There is moderate   left ventricular hypertrophy. Left ventricular diastolic parameters are  consistent with Grade II diastolic dysfunction (pseudonormalization).  Elevated left atrial pressure.   Right Ventricle: The right ventricular size is normal. Right vetricular  wall thickness was not assessed. Right ventricular systolic function is  normal. Tricuspid regurgitation signal is inadequate for assessing PA  pressure.   Left Atrium: Left atrial size was normal in size.   Right Atrium: Right atrial size was normal in size.   Pericardium: There is no evidence  of pericardial effusion.   Mitral Valve: The mitral valve is normal in structure. Trivial mitral  valve regurgitation.   Tricuspid Valve: The tricuspid valve is normal in structure. Tricuspid  valve regurgitation is trivial.   Aortic Valve: The aortic valve has been repaired/replaced. Aortic valve  regurgitation is not visualized. Aortic valve mean gradient measures 11.5  mmHg. Aortic valve peak gradient measures 23.2 mmHg. Aortic valve area, by  VTI measures 1.66 cm. There is a   23 mm Inspiris Resilia valve present in the aortic position. Echo  findings are consistent with normal structure and function of the aortic  valve prosthesis.   Pulmonic Valve: The pulmonic valve was not well visualized. Pulmonic valve  regurgitation is not visualized.   Aorta: The aortic root is normal in size and structure.   Venous: The inferior vena cava is normal in size with greater than 50%  respiratory variability, suggesting right atrial pressure of 3 mmHg.   IAS/Shunts: No atrial level shunt detected by color flow Doppler.    PET/cardiac CT 04/01/2022  Perfusion/Defect LV perfusion is abnormal. There is evidence of ischemia. Defect 1: There is a medium defect with severe reduction in uptake present in the apical to basal anterolateral and inferolateral location(s) that is reversible. There is abnormal wall motion in the defect area. Consistent with ischemia. Defect 2: There is a small defect present in the mid to basal inferior location(s). There is abnormal wall motion in the defect area. Consistent with ischemia.  Ejection Fraction Rest left ventricular function is normal. Rest EF: 58 %. EF decreased with stress to 50%. End diastolic cavity size is normal. End systolic cavity size is normal. Evidence of transient ischemic dilation (TID) noted.  Myocardial Bood Flow Myocardial blood flow reserve is not reported in this patient due to history of CABG that affect accuracy. Of note, MBF is most  abnormal in the LCx territory which corresponds with perfusion abnormality seen on imaging.  Calcium Scoring Coronary calcium assessment  not performed due to prior revascularization.  Stress Combined Conclusion Findings are consistent with ischemia. The study is high risk.    Assessment & Plan   1.  Coronary artery disease-no chest pain today.  Underwent PET/CT cardiac perfusion study 04/01/2022 which showed high risk and findings consistent with ischemia.  Details above. Order cardiac catheterization Order CBC, BMP  Shared Decision Making/Informed Consent The risks [stroke (1 in 1000), death (1 in 59), kidney failure [usually temporary] (1 in 500), bleeding (1 in 200), allergic reaction [possibly serious] (1 in 200)], benefits (diagnostic support and management of coronary artery disease) and alternatives of a cardiac catheterization were discussed in detail with Mr. Gertz and he is willing to proceed.   Essential hypertension-BP today 126/64 Continue Imdur, lisinopril, Cardura Heart healthy low-sodium diet-salty 6 given Increase physical activity as tolerated  Status post aortic valve replacement-no increased DOE or activity intolerance.  Echocardiogram 02/08/2020 showed an EF of 60-65%, G2 DD, and well-functioning aortic valve prosthesis. Plan for repeat echocardiogram when clinically indicated.   Hyperlipidemia-LDL 47 on 03/12/2021 Continue aspirin, atorvastatin Heart healthy low-sodium high-fiber diet Increase physical activity as tolerated   Disposition: Follow-up with Dr. Stanford Breed or me after cardiac catheterization.  Jossie Ng. Hawken Bielby NP-C     04/07/2022, 8:41 AM [place] [address] Office [phone] Fax [phone]    I spent 14 minutes examining this patient, reviewing medications, and using patient centered shared decision making involving her cardiac care.  Prior to her visit I spent greater than 20 minutes  reviewing her past medical history,  medications, and prior cardiac tests.

## 2022-04-04 NOTE — Progress Notes (Signed)
Cardiology Clinic Note   Patient Name: Steven Lawrence Date of Encounter: 04/07/2022  Primary Care Provider:  Lennie Odor, [place] Primary Cardiologist:  [name], MD  Patient Profile    Steven Lawrence 72 year old male presents to the clinic today for follow-up evaluation of his essential hypertension and follow-up for PET/CT cardiac perfusion study.  Past Medical History    Past Medical History:  Diagnosis Date   Acid reflux    Arthritis    BPH (benign prostatic hyperplasia)    mild   CAD in native artery    a. NSTEMI 12/2019 s/p CABGx5 with bovine AVR.   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[place])    type II   History of pneumonia as a child    Hypercholesteremia    Hypothyroidism    RLS (restless legs syndrome)    S/P aortic valve replacement with bioprosthetic valve    S/P CABG (coronary artery bypass graft)    Sleep apnea    does not use a cpap,primarily with upper airway resistance syndrome AHI 2.35/hr, RDI 10.2/hr ([name])   Urinary frequency    Wears glasses    Wears hearing aid    both ears   Past Surgical History:  Procedure Laterality Date   ANTERIOR LAT LUMBAR FUSION Left 01/04/2015   Procedure: ANTERIOR LATERAL LUMBAR FUSION 1 LEVEL;  Surgeon: Phylliss Bob, MD;  Location: [facility];  Service: Orthopedics;  Laterality: Left;  Left sided lateral interbody fusion lumbar 3-4 with instrumentation and allograft   AORTIC VALVE REPLACEMENT N/A 12/21/2019   Procedure: AORTIC VALVE REPLACEMENT WITH INSPIRIS RESILIA AORTIC VALVE SIZE 23MM;  Surgeon: Melrose Nakayama, MD;  Location: [facility];  Service: Open Heart Surgery;  Laterality: N/A;   COLONOSCOPY     CORONARY ARTERY BYPASS GRAFT N/A 12/21/2019   Procedure: CORONARY ARTERY BYPASS GRAFTING TIMES FIVE USING LEFT INTERNAL [employer] ARTERY AND ENDOSCOPICALLY HARVESTED RIGHT GREATER SAPHENOUS VEIN.;  Surgeon: Melrose Nakayama, MD;  Location: [facility];  Service: Open Heart Surgery;  Laterality:  N/A;   ENDOVEIN HARVEST OF GREATER SAPHENOUS VEIN  12/21/2019   Procedure: ENDOVEIN HARVEST OF RIGHT GREATER SAPHENOUS VEIN;  Surgeon: Melrose Nakayama, MD;  Location: [facility];  Service: Open Heart Surgery;;   ESOPHAGOGASTRODUODENOSCOPY     INGUINAL HERNIA REPAIR Right 08/12/2012   Procedure: HERNIA REPAIR INGUINAL ADULT;  Surgeon: Imogene Burn. Georgette Dover, MD;  Location: [facility];  Service: General;  Laterality: Right;   INSERTION OF MESH Right 08/12/2012   Procedure: INSERTION OF MESH;  Surgeon: Imogene Burn. Georgette Dover, MD;  Location: [place];  Service: General;  Laterality: Right;   LEFT HEART CATH AND CORONARY ANGIOGRAPHY N/A 12/19/2019   Procedure: LEFT HEART CATH AND CORONARY ANGIOGRAPHY;  Surgeon: Lorretta Harp, MD;  Location: [facility];  Service: Cardiovascular;  Laterality: N/A;   TEE WITHOUT CARDIOVERSION N/A 12/21/2019   Procedure: TRANSESOPHAGEAL ECHOCARDIOGRAM (TEE);  Surgeon: Melrose Nakayama, MD;  Location: [facility];  Service: Open Heart Surgery;  Laterality: N/A;   TONSILLECTOMY     VASECTOMY  1985    Allergies  No Known Allergies  History of Present Illness    Steven Lawrence has a PMH of aortic valve replacement, hyperlipidemia, type 2 diabetes, and coronary artery disease status post CABG x 5 on 10/21.  He was hospitalized with NSTEMI 10/21.  His echocardiogram at that time showed normal LV function mild left ventricular hypertrophy, moderate aortic  stenosis.  He underwent cardiac catheterization which showed occluded RCA, 95% circumflex, and 80% LAD.  His bilateral carotid Doppler showed 1-39% stenosis.  He underwent coronary artery bypass grafting LIMA-LAD, SVG-first diagonal, SVG-OM 2/OM 3, and SVG-PDA along with aortic valve replacement (bovine).  His course was complicated by atrial fibrillation.  He was treated with amiodarone and apixaban.  His follow-up echocardiogram 11/21 showed normal LV function, moderate left ventricular  hypertrophy, G2 DD, aortic valve gradient 13 mmHg and well-functioning aortic valve.  He had abdominal ultrasound 12/21 which showed no aneurysm.  He was seen in follow-up by Dr. Stanford Breed on 03/06/2022.  During that time he did note occasional chest discomfort.  He had some right substernal discomfort without radiation.  His discomfort was relieved with rest.  He reported it was unlike his previous anginal equivalent pain.  He denied dyspnea and syncope.  He underwent nuclear medicine PET/CT cardiac perfusion study which showed high risk and reversible ischemia.  He presents to the clinic today for review of the study.  He states he notices chest discomfort with increased physical activity.  He denies chest discomfort with normal daily activities.  He was previously diagnosed with OSA and was CPAP intolerant.  He reports that his OSA was mild at that time.  We reviewed his cardiac CT and he expressed understanding.  I will order a CBC, BMP and plan follow-up for after his catheterization.  Today he denies shortness of breath, lower extremity edema, fatigue, palpitations, melena, hematuria, hemoptysis, diaphoresis, weakness, presyncope, syncope, orthopnea, and PND.   Home Medications    Prior to Admission medications   Medication Sig Start Date End Date Taking? Authorizing Provider  amoxicillin (AMOXIL) 500 MG tablet Take 4 tablets (2000 mg) at once 30-60 min prior to dental procedures. 03/20/22   Ledora Bottcher, PA  aspirin EC 81 MG EC tablet Take 1 tablet (81 mg total) by mouth daily. Swallow whole. 12/27/19   Antony Odea, PA-C  atorvastatin (LIPITOR) 80 MG tablet Take 80 mg by mouth daily.  07/23/12   [provider]  canagliflozin (INVOKANA) 100 MG TABS tablet Take 300 mg by mouth daily.    [provider]  cholestyramine light (PREVALITE) 4 GM/DOSE powder Take 1 g by mouth daily as needed (For stomach).    [provider]  doxazosin (CARDURA) 2 MG tablet  Take 2 mg by mouth every morning. Takes for urinary freq 07/27/12   [provider]  insulin degludec (TRESIBA) 100 UNIT/ML FlexTouch Pen Inject 100 Units into the skin at bedtime. 01/15/22   [provider]  isosorbide mononitrate (IMDUR) 30 MG 24 hr tablet Take 1 tablet (30 mg total) by mouth daily. 03/07/22   Lelon Perla, MD  levothyroxine (SYNTHROID, LEVOTHROID) 50 MCG tablet Take 75 mcg by mouth daily before breakfast.     [provider]  lisinopril (ZESTRIL) 5 MG tablet Take 5 mg by mouth daily. 02/21/20   [provider]  metFORMIN (GLUCOPHAGE) 1000 MG tablet Take 1,000 mg by mouth 2 (two) times daily with a meal.    [provider]  pramipexole (MIRAPEX) 0.125 MG tablet Take 0.125 mg by mouth as directed. x4 RLS at Bedtime    [provider]    Family History    Family History  Problem Relation Age of Onset   Diabetes Mother    Heart disease Mother    Parkinson's disease Mother    Heart disease Father    Cancer Father  melonma/Lung   Diabetes Sister    Diabetes Brother    Heart disease Brother    Multiple sclerosis Daughter    He indicated that his mother is deceased. He indicated that his father is deceased. He indicated that both of his sisters are alive. He indicated that his brother is deceased. He indicated that his daughter is alive.  Social History    Social History   Socioeconomic History   Marital status: Married    Spouse name: Not on file   Number of children: Not on file   Years of education: Not on file   Highest education level: Not on file  Occupational History   Not on file  Tobacco Use   Smoking status: Former    Packs/day: 1.00    Years: 53.00    Total pack years: 53.00    Types: Cigarettes    Quit date: 12/16/2019    Years since quitting: 2.3   Smokeless tobacco: Never  Substance and Sexual Activity   Alcohol use: Yes    Comment: occ   Drug use: No   Sexual activity: Not on  file  Other Topics Concern   Not on file  Social History Narrative   Not on file   Social Determinants of Health   Financial Resource Strain: Not on file  Food Insecurity: Not on file  Transportation Needs: Not on file  Physical Activity: Not on file  Stress: Not on file  Social Connections: Not on file  Intimate Partner Violence: Not on file     Review of Systems    General:  No chills, fever, night sweats or weight changes.  Cardiovascular:  No chest pain, dyspnea on exertion, edema, orthopnea, palpitations, paroxysmal nocturnal dyspnea. Dermatological: No rash, lesions/masses Respiratory: No cough, dyspnea Urologic: No hematuria, dysuria Abdominal:   No nausea, vomiting, diarrhea, bright red blood per rectum, melena, or hematemesis Neurologic:  No visual changes, wkns, changes in mental status. All other systems reviewed and are otherwise negative except as noted above.  Physical Exam    VS:  BP 126/64   Pulse 70   Wt 164 lb 12.8 oz (74.8 kg)   SpO2 97%   BMI 24.34 kg/m  , BMI Body mass index is 24.34 kg/m. GEN: Well nourished, well developed, in no acute distress. HEENT: normal. Neck: Supple, no JVD, carotid bruits, or masses. Cardiac: RRR, no murmurs, rubs, or gallops. No clubbing, cyanosis, edema.  Radials/DP/PT 2+ and equal bilaterally.  Respiratory:  Respirations regular and unlabored, clear to auscultation bilaterally. GI: Soft, nontender, nondistended, BS + x 4. MS: no deformity or atrophy. Skin: warm and dry, no rash. Neuro:  Strength and sensation are intact. Psych: Normal affect.  Accessory Clinical Findings    Recent Labs: No results found for requested labs within last 365 days.   Recent Lipid Panel    Component Value Date/Time   CHOL 115 09/05/2020 0943   TRIG 48 09/05/2020 0943   HDL 55 09/05/2020 0943   CHOLHDL 2.1 09/05/2020 0943   CHOLHDL 2.5 12/20/2019 0537   VLDL 9 12/20/2019 0537   LDLCALC 48 09/05/2020 0943         ECG  personally reviewed by me today-none today.  Echocardiogram 02/08/2020  IMPRESSIONS     1. Left ventricular ejection fraction, by estimation, is 60 to 65%. The  left ventricle has normal function. The left ventricle has no regional  wall motion abnormalities. There is moderate left ventricular hypertrophy.  Left ventricular diastolic  parameters are consistent with Grade II diastolic dysfunction  (pseudonormalization). Elevated left atrial pressure.   2. Right ventricular systolic function is normal. The right ventricular  size is normal. Tricuspid regurgitation signal is inadequate for assessing  PA pressure.   3. The mitral valve is normal in structure. Trivial mitral valve  regurgitation.   4. There is a 23 mm Inspiris Resilia valve present in the aortic  position. Aortic valve regurgitation is not visualized. MG 13 mmHg. EOA  1.6 cm^2. Echo findings are consistent with normal structure and function  of the aortic valve prosthesis.   5. The inferior vena cava is normal in size with greater than 50%  respiratory variability, suggesting right atrial pressure of 3 mmHg.   FINDINGS   Left Ventricle: Left ventricular ejection fraction, by estimation, is 60  to 65%. The left ventricle has normal function. The left ventricle has no  regional wall motion abnormalities. The left ventricular internal cavity  size was small. There is moderate   left ventricular hypertrophy. Left ventricular diastolic parameters are  consistent with Grade II diastolic dysfunction (pseudonormalization).  Elevated left atrial pressure.   Right Ventricle: The right ventricular size is normal. Right vetricular  wall thickness was not assessed. Right ventricular systolic function is  normal. Tricuspid regurgitation signal is inadequate for assessing PA  pressure.   Left Atrium: Left atrial size was normal in size.   Right Atrium: Right atrial size was normal in size.   Pericardium: There is no evidence  of pericardial effusion.   Mitral Valve: The mitral valve is normal in structure. Trivial mitral  valve regurgitation.   Tricuspid Valve: The tricuspid valve is normal in structure. Tricuspid  valve regurgitation is trivial.   Aortic Valve: The aortic valve has been repaired/replaced. Aortic valve  regurgitation is not visualized. Aortic valve mean gradient measures 11.5  mmHg. Aortic valve peak gradient measures 23.2 mmHg. Aortic valve area, by  VTI measures 1.66 cm. There is a   23 mm Inspiris Resilia valve present in the aortic position. Echo  findings are consistent with normal structure and function of the aortic  valve prosthesis.   Pulmonic Valve: The pulmonic valve was not well visualized. Pulmonic valve  regurgitation is not visualized.   Aorta: The aortic root is normal in size and structure.   Venous: The inferior vena cava is normal in size with greater than 50%  respiratory variability, suggesting right atrial pressure of 3 mmHg.   IAS/Shunts: No atrial level shunt detected by color flow Doppler.    PET/cardiac CT 04/01/2022  Perfusion/Defect LV perfusion is abnormal. There is evidence of ischemia. Defect 1: There is a medium defect with severe reduction in uptake present in the apical to basal anterolateral and inferolateral location(s) that is reversible. There is abnormal wall motion in the defect area. Consistent with ischemia. Defect 2: There is a small defect present in the mid to basal inferior location(s). There is abnormal wall motion in the defect area. Consistent with ischemia.  Ejection Fraction Rest left ventricular function is normal. Rest EF: 58 %. EF decreased with stress to 50%. End diastolic cavity size is normal. End systolic cavity size is normal. Evidence of transient ischemic dilation (TID) noted.  Myocardial Bood Flow Myocardial blood flow reserve is not reported in this patient due to history of CABG that affect accuracy. Of note, MBF is most  abnormal in the LCx territory which corresponds with perfusion abnormality seen on imaging.  Calcium Scoring Coronary calcium assessment  not performed due to prior revascularization.  Stress Combined Conclusion Findings are consistent with ischemia. The study is high risk.    Assessment & Plan   1.  Coronary artery disease-no chest pain today.  Underwent PET/CT cardiac perfusion study 04/01/2022 which showed high risk and findings consistent with ischemia.  Details above. Order cardiac catheterization Order CBC, BMP  Shared Decision Making/Informed Consent The risks [stroke (1 in 1000), death (1 in 40), kidney failure [usually temporary] (1 in 500), bleeding (1 in 200), allergic reaction [possibly serious] (1 in 200)], benefits (diagnostic support and management of coronary artery disease) and alternatives of a cardiac catheterization were discussed in detail with Steven Lawrence and he is willing to proceed.   Essential hypertension-BP today 126/64 Continue Imdur, lisinopril, Cardura Heart healthy low-sodium diet-salty 6 given Increase physical activity as tolerated  Status post aortic valve replacement-no increased DOE or activity intolerance.  Echocardiogram 02/08/2020 showed an EF of 60-65%, G2 DD, and well-functioning aortic valve prosthesis. Plan for repeat echocardiogram when clinically indicated.   Hyperlipidemia-LDL 47 on 03/12/2021 Continue aspirin, atorvastatin Heart healthy low-sodium high-fiber diet Increase physical activity as tolerated   Disposition: Follow-up with Dr. Stanford Breed or me after cardiac catheterization.  Jossie Ng. Aesha Agrawal NP-C     04/07/2022, 8:41 AM [place] [address] Office [phone] Fax [phone]    I spent 14 minutes examining this patient, reviewing medications, and using patient centered shared decision making involving her cardiac care.  Prior to her visit I spent greater than 20 minutes  reviewing her past medical history,  medications, and prior cardiac tests.

## 2022-04-07 ENCOUNTER — Ambulatory Visit: Payer: Medicare Other | Attending: General Practice | Admitting: General Practice

## 2022-04-07 ENCOUNTER — Encounter: Payer: Self-pay | Admitting: General Practice

## 2022-04-07 VITALS — BP 126/64 | HR 70 | Wt 164.8 lb

## 2022-04-07 DIAGNOSIS — Z952 Presence of prosthetic heart valve: Secondary | ICD-10-CM | POA: Diagnosis not present

## 2022-04-07 DIAGNOSIS — Z951 Presence of aortocoronary bypass graft: Secondary | ICD-10-CM | POA: Diagnosis not present

## 2022-04-07 DIAGNOSIS — I2510 Atherosclerotic heart disease of native coronary artery without angina pectoris: Secondary | ICD-10-CM | POA: Insufficient documentation

## 2022-04-07 DIAGNOSIS — I10 Essential (primary) hypertension: Secondary | ICD-10-CM | POA: Diagnosis not present

## 2022-04-07 DIAGNOSIS — E785 Hyperlipidemia, unspecified: Secondary | ICD-10-CM | POA: Insufficient documentation

## 2022-04-07 LAB — CBC

## 2022-04-07 MED ORDER — SODIUM CHLORIDE 0.9% FLUSH: 3.0000 mL | Freq: Two times a day (BID) | INTRAVENOUS | Status: DC

## 2022-04-07 NOTE — Patient Instructions (Addendum)
  [facility] [id number] [place] [address] Dept: [phone] Loc: [place]  04/07/2022  You are scheduled for a Cardiac Catheterization on Friday, January 26 with Dr. Sherren Mocha.  1. Please arrive at the [facility] (Main Entrance A) at [facility]: [address] [place], [place] [address] at 8:30 AM (This time is two hours before your procedure to ensure your preparation). Free valet parking service is available.   Special note: Every effort is made to have your procedure done on time. Please understand that emergencies sometimes delay scheduled procedures.  2. Diet: Do not eat solid foods after midnight.  The patient may have clear liquids until 5am upon the day of the procedure.  3. Labs: You will need to have blood drawn on Monday, January 22 at [facility]  Open: 8am - 5pm (Lunch 12:30 - 1:30)   Phone: [phone]. You do not need to be fasting.  4. Medication instructions in preparation for your procedure:   Contrast Allergy: No    Do not take Diabetes Med Glucophage (Metformin) on the day of the procedure and HOLD 48 HOURS AFTER THE PROCEDURE.  On the morning of your procedure, take your Aspirin 81 mg and any morning medicines NOT listed above.  You may use sips of water.  5. Plan for one night stay--bring personal belongings. 6. Bring a current list of your medications and current insurance cards. 7. You MUST have a responsible person to drive you home. 8. Someone MUST be with you the first 24 hours after you arrive home or your discharge will be delayed. 9. Please wear clothes that are easy to get on and off and wear slip-on shoes.  Thank you for allowing [place] to care for you!   -- [facility]

## 2022-04-08 ENCOUNTER — Other Ambulatory Visit (HOSPITAL_COMMUNITY): Payer: Medicare Other

## 2022-04-08 LAB — CBC
Hematocrit: 38.2 % (ref 37.5–51.0)
Hemoglobin: 12.7 g/dL — ABNORMAL LOW (ref 13.0–17.7)
MCH: 30.5 pg (ref 26.6–33.0)
MCHC: 33.2 g/dL (ref 31.5–35.7)
MCV: 92 fL (ref 79–97)
Platelets: 184 10*3/uL (ref 150–450)
RBC: 4.17 x10E6/uL (ref 4.14–5.80)
RDW: 13.6 % (ref 11.6–15.4)
WBC: 5 10*3/uL (ref 3.4–10.8)

## 2022-04-08 LAB — BASIC METABOLIC PANEL
BUN/Creatinine Ratio: 25 — ABNORMAL HIGH (ref 10–24)
BUN: 20 mg/dL (ref 8–27)
CO2: 25 mmol/L (ref 20–29)
Calcium: 9.2 mg/dL (ref 8.6–10.2)
Chloride: 105 mmol/L (ref 96–106)
Creatinine, Ser: 0.8 mg/dL (ref 0.76–1.27)
Glucose: 110 mg/dL — ABNORMAL HIGH (ref 70–99)
Potassium: 5 mmol/L (ref 3.5–5.2)
Sodium: 143 mmol/L (ref 134–144)
eGFR: 95 mL/min/{1.73_m2} (ref >59)

## 2022-04-10 ENCOUNTER — Telehealth: Payer: Self-pay | Admitting: *Deleted

## 2022-04-10 NOTE — Telephone Encounter (Addendum)
Cardiac Catheterization scheduled at [facility] for: Friday April 11, 2022 10:30 AM Arrival time and place: [place] Entrance A at: 8:30 AM  Nothing to eat after midnight prior to procedure, clear liquids until 5 AM day of procedure.  Medication instructions: -Hold:  Metformin-day of procedure and 48 hours post procedure  Invokana-AM of procedure  Tresiba-1/2 usual dose HS prior to procedure -Except hold medications usual morning medications can be taken with sips of water including aspirin 81 mg.  Confirmed patient has responsible adult to drive home post procedure and be with patient first 24 hours after arriving home.  Patient reports no new symptoms concerning for COVID-19 in the past 10 days.  Reviewed procedure instructions with patient.

## 2022-04-11 ENCOUNTER — Ambulatory Visit (HOSPITAL_COMMUNITY)
Admission: RE | Disposition: A | Discharge status: Home / Self Care | Payer: Self-pay | Source: Ambulatory Visit | Attending: Cardiovascular Disease

## 2022-04-11 ENCOUNTER — Other Ambulatory Visit: Payer: Self-pay

## 2022-04-11 ENCOUNTER — Other Ambulatory Visit (HOSPITAL_COMMUNITY): Payer: Self-pay

## 2022-04-11 ENCOUNTER — Ambulatory Visit (HOSPITAL_COMMUNITY)
Admission: RE | Admit: 2022-04-11 | Discharge: 2022-04-11 | Disposition: A | Discharge status: Home / Self Care | Payer: Medicare Other | Source: Ambulatory Visit | Attending: Cardiovascular Disease | Admitting: Cardiovascular Disease

## 2022-04-11 DIAGNOSIS — E785 Hyperlipidemia, unspecified: Secondary | ICD-10-CM | POA: Insufficient documentation

## 2022-04-11 DIAGNOSIS — Z794 Long term (current) use of insulin: Secondary | ICD-10-CM | POA: Diagnosis not present

## 2022-04-11 DIAGNOSIS — Z7984 Long term (current) use of oral hypoglycemic drugs: Secondary | ICD-10-CM | POA: Diagnosis not present

## 2022-04-11 DIAGNOSIS — I25709 Atherosclerosis of coronary artery bypass graft(s), unspecified, with unspecified angina pectoris: Secondary | ICD-10-CM | POA: Diagnosis not present

## 2022-04-11 DIAGNOSIS — E119 Type 2 diabetes mellitus without complications: Secondary | ICD-10-CM | POA: Insufficient documentation

## 2022-04-11 DIAGNOSIS — G4733 Obstructive sleep apnea (adult) (pediatric): Secondary | ICD-10-CM | POA: Diagnosis not present

## 2022-04-11 DIAGNOSIS — I252 Old myocardial infarction: Secondary | ICD-10-CM | POA: Diagnosis not present

## 2022-04-11 DIAGNOSIS — I6523 Occlusion and stenosis of bilateral carotid arteries: Secondary | ICD-10-CM | POA: Insufficient documentation

## 2022-04-11 DIAGNOSIS — I2582 Chronic total occlusion of coronary artery: Secondary | ICD-10-CM | POA: Diagnosis not present

## 2022-04-11 DIAGNOSIS — Z79899 Other long term (current) drug therapy: Secondary | ICD-10-CM | POA: Insufficient documentation

## 2022-04-11 DIAGNOSIS — I2510 Atherosclerotic heart disease of native coronary artery without angina pectoris: Secondary | ICD-10-CM

## 2022-04-11 DIAGNOSIS — Z955 Presence of coronary angioplasty implant and graft: Secondary | ICD-10-CM

## 2022-04-11 DIAGNOSIS — I10 Essential (primary) hypertension: Secondary | ICD-10-CM | POA: Insufficient documentation

## 2022-04-11 DIAGNOSIS — Z953 Presence of xenogenic heart valve: Secondary | ICD-10-CM | POA: Insufficient documentation

## 2022-04-11 DIAGNOSIS — I2584 Coronary atherosclerosis due to calcified coronary lesion: Secondary | ICD-10-CM | POA: Diagnosis not present

## 2022-04-11 DIAGNOSIS — Z87891 Personal history of nicotine dependence: Secondary | ICD-10-CM | POA: Diagnosis not present

## 2022-04-11 DIAGNOSIS — I25119 Atherosclerotic heart disease of native coronary artery with unspecified angina pectoris: Secondary | ICD-10-CM | POA: Diagnosis not present

## 2022-04-11 DIAGNOSIS — I25719 Atherosclerosis of autologous vein coronary artery bypass graft(s) with unspecified angina pectoris: Secondary | ICD-10-CM

## 2022-04-11 HISTORY — PX: LEFT HEART CATH AND CORS/GRAFTS ANGIOGRAPHY: CATH118250

## 2022-04-11 HISTORY — PX: CORONARY STENT INTERVENTION: CATH118234

## 2022-04-11 LAB — GLUCOSE, CAPILLARY: Glucose-Capillary: 138 mg/dL — ABNORMAL HIGH (ref 70–99)

## 2022-04-11 LAB — POCT ACTIVATED CLOTTING TIME
Activated Clotting Time: 212 seconds
Activated Clotting Time: 271 seconds

## 2022-04-11 SURGERY — LEFT HEART CATH AND CORS/GRAFTS ANGIOGRAPHY
Anesthesia: LOCAL

## 2022-04-11 MED ORDER — LIDOCAINE HCL (PF) 1 % IJ SOLN
INTRAMUSCULAR | Status: DC | PRN
  Administered 2022-04-11: 2 mL via INTRADERMAL

## 2022-04-11 MED ORDER — ONDANSETRON HCL 4 MG/2ML IJ SOLN: 4.0000 mg | Freq: Four times a day (QID) | INTRAMUSCULAR | Status: DC | PRN

## 2022-04-11 MED ORDER — SODIUM CHLORIDE 0.9 % IV SOLN: 250.0000 mL | INTRAVENOUS | Status: DC | PRN

## 2022-04-11 MED ORDER — FENTANYL CITRATE (PF) 100 MCG/2ML IJ SOLN
INTRAMUSCULAR | Status: AC
  Filled 2022-04-11: qty 2

## 2022-04-11 MED ORDER — SODIUM CHLORIDE 0.9% FLUSH: 3.0000 mL | Freq: Two times a day (BID) | INTRAVENOUS | Status: DC

## 2022-04-11 MED ORDER — VERAPAMIL HCL 2.5 MG/ML IV SOLN
INTRAVENOUS | Status: AC
  Filled 2022-04-11: qty 2

## 2022-04-11 MED ORDER — HYDRALAZINE HCL 20 MG/ML IJ SOLN: 10.0000 mg | INTRAMUSCULAR | Status: DC | PRN

## 2022-04-11 MED ORDER — HEPARIN (PORCINE) IN NACL 1000-0.9 UT/500ML-% IV SOLN
INTRAVENOUS | Status: DC | PRN
  Administered 2022-04-11 (×2): 500 mL

## 2022-04-11 MED ORDER — CLOPIDOGREL BISULFATE 300 MG PO TABS
ORAL_TABLET | ORAL | Status: DC | PRN
  Administered 2022-04-11: 600 mg via ORAL

## 2022-04-11 MED ORDER — CLOPIDOGREL BISULFATE 75 MG PO TABS: 75.0000 mg | ORAL_TABLET | Freq: Every day | ORAL | 11 refills | Status: DC

## 2022-04-11 MED ORDER — SODIUM CHLORIDE 0.9 % WEIGHT BASED INFUSION
3.0000 mL/kg/h | INTRAVENOUS | Status: AC
  Administered 2022-04-11: 3 mL/kg/h via INTRAVENOUS

## 2022-04-11 MED ORDER — SODIUM CHLORIDE 0.9 % WEIGHT BASED INFUSION: 1.0000 mL/kg/h | INTRAVENOUS | Status: DC

## 2022-04-11 MED ORDER — NITROGLYCERIN 1 MG/10 ML FOR IR/CATH LAB
INTRA_ARTERIAL | Status: AC
  Filled 2022-04-11: qty 10

## 2022-04-11 MED ORDER — MIDAZOLAM HCL 2 MG/2ML IJ SOLN
INTRAMUSCULAR | Status: AC
  Filled 2022-04-11: qty 2

## 2022-04-11 MED ORDER — MIDAZOLAM HCL 2 MG/2ML IJ SOLN
INTRAMUSCULAR | Status: DC | PRN
  Administered 2022-04-11 (×3): 1 mg via INTRAVENOUS

## 2022-04-11 MED ORDER — CLOPIDOGREL BISULFATE 75 MG PO TABS
75.0000 mg | ORAL_TABLET | Freq: Every day | ORAL | 11 refills | Status: DC
  Filled 2022-04-11: qty 30, 30d supply, fill #0

## 2022-04-11 MED ORDER — LABETALOL HCL 5 MG/ML IV SOLN: 10.0000 mg | INTRAVENOUS | Status: DC | PRN

## 2022-04-11 MED ORDER — SODIUM CHLORIDE 0.9% FLUSH: 3.0000 mL | INTRAVENOUS | Status: DC | PRN

## 2022-04-11 MED ORDER — SODIUM CHLORIDE 0.9 % IV SOLN
INTRAVENOUS | Status: DC | PRN
  Administered 2022-04-11: 500 mL via INTRAVENOUS

## 2022-04-11 MED ORDER — LIDOCAINE HCL (PF) 1 % IJ SOLN
INTRAMUSCULAR | Status: AC
  Filled 2022-04-11: qty 30

## 2022-04-11 MED ORDER — SODIUM CHLORIDE 0.9% FLUSH
3.0000 mL | INTRAVENOUS | Status: DC | PRN
  Administered 2022-04-11 (×3): 3 mL via INTRAVENOUS

## 2022-04-11 MED ORDER — VERAPAMIL HCL 2.5 MG/ML IV SOLN
INTRAVENOUS | Status: DC | PRN
  Administered 2022-04-11: 10 mL via INTRA_ARTERIAL

## 2022-04-11 MED ORDER — VERAPAMIL HCL 2.5 MG/ML IV SOLN
INTRAVENOUS | Status: DC | PRN
  Administered 2022-04-11 (×4): 200 ug via INTRACORONARY

## 2022-04-11 MED ORDER — ACETAMINOPHEN 325 MG PO TABS: 650.0000 mg | ORAL_TABLET | ORAL | Status: DC | PRN

## 2022-04-11 MED ORDER — HEPARIN SODIUM (PORCINE) 1000 UNIT/ML IJ SOLN
INTRAMUSCULAR | Status: DC | PRN
  Administered 2022-04-11: 2000 [IU] via INTRAVENOUS
  Administered 2022-04-11 (×3): 4000 [IU] via INTRAVENOUS

## 2022-04-11 MED ORDER — HEPARIN SODIUM (PORCINE) 1000 UNIT/ML IJ SOLN
INTRAMUSCULAR | Status: AC
  Filled 2022-04-11: qty 10

## 2022-04-11 MED ORDER — IOHEXOL 350 MG/ML SOLN
INTRAVENOUS | Status: DC | PRN
  Administered 2022-04-11: 120 mL

## 2022-04-11 MED ORDER — ASPIRIN 81 MG PO CHEW
81.0000 mg | CHEWABLE_TABLET | ORAL | Status: AC
  Administered 2022-04-11: 81 mg via ORAL

## 2022-04-11 MED ORDER — CLOPIDOGREL BISULFATE 75 MG PO TABS: 75.0000 mg | ORAL_TABLET | Freq: Every day | ORAL | Status: DC

## 2022-04-11 MED ORDER — CLOPIDOGREL BISULFATE 300 MG PO TABS
ORAL_TABLET | ORAL | Status: AC
  Filled 2022-04-11: qty 2

## 2022-04-11 MED ORDER — FENTANYL CITRATE (PF) 100 MCG/2ML IJ SOLN
INTRAMUSCULAR | Status: DC | PRN
  Administered 2022-04-11 (×3): 25 ug via INTRAVENOUS

## 2022-04-11 MED ORDER — HEPARIN (PORCINE) IN NACL 1000-0.9 UT/500ML-% IV SOLN
INTRAVENOUS | Status: AC
  Filled 2022-04-11: qty 1000

## 2022-04-11 SURGICAL SUPPLY — 22 items
BALLN SAPPHIRE 3.0X12 (BALLOONS) ×1
BALLOON SAPPHIRE 3.0X12 (BALLOONS) IMPLANT
CATH EXPO 5F MPA-1 (CATHETERS) IMPLANT
CATH INFINITI 5 FR IM (CATHETERS) IMPLANT
CATH INFINITI 5FR AL1 (CATHETERS) IMPLANT
CATH INFINITI 5FR JL4 (CATHETERS) IMPLANT
CATH INFINITI JR4 5F (CATHETERS) IMPLANT
CATH LAUNCHER 5F MPST (CATHETERS) IMPLANT
CATHETER LAUNCHER 5F MPST (CATHETERS) ×1
DEVICE RAD COMP TR BAND LRG (VASCULAR PRODUCTS) IMPLANT
ELECT DEFIB PAD ADLT CADENCE (PAD) IMPLANT
GLIDESHEATH SLEND SS 6F .021 (SHEATH) IMPLANT
GUIDEWIRE INQWIRE 1.5J.035X260 (WIRE) IMPLANT
INQWIRE 1.5J .035X260CM (WIRE) ×1
KIT ENCORE 26 ADVANTAGE (KITS) IMPLANT
KIT HEART LEFT (KITS) ×1 IMPLANT
PACK CARDIAC CATHETERIZATION (CUSTOM PROCEDURE TRAY) ×1 IMPLANT
STENT SYNERGY XD 3.50X16 (Permanent Stent) IMPLANT
SYNERGY XD 3.50X16 (Permanent Stent) ×2 IMPLANT
TRANSDUCER W/STOPCOCK (MISCELLANEOUS) ×1 IMPLANT
TUBING CIL FLEX 10 FLL-RA (TUBING) ×1 IMPLANT
WIRE COUGAR XT STRL 190CM (WIRE) IMPLANT

## 2022-04-11 NOTE — Interval H&P Note (Signed)
Cath Lab Visit (complete for each Cath Lab visit)  Clinical Evaluation Leading to the Procedure:   ACS: No.  Non-ACS:    Anginal Classification: CCS II  Anti-ischemic medical therapy: No Therapy  Non-Invasive Test Results: High-risk stress test findings: cardiac mortality >3%/year  Prior CABG: Previous CABG      History and Physical Interval Note:  04/11/2022 10:32 AM  Steven Lawrence  has presented today for surgery, with the diagnosis of cad.  The various methods of treatment have been discussed with the patient and family. After consideration of risks, benefits and other options for treatment, the patient has consented to  Procedure(s): LEFT HEART CATH AND CORS/GRAFTS ANGIOGRAPHY (N/A) as a surgical intervention.  The patient's history has been reviewed, patient examined, no change in status, stable for surgery.  I have reviewed the patient's chart and labs.  Questions were answered to the patient's satisfaction.     Sherren Mocha

## 2022-04-11 NOTE — Progress Notes (Signed)
Pt was educated on stent card, stent location, Plavix and ASA use, wt restrictions, no baths/daily wash-ups, s/s of infection, ex guidelines (progressive walking and resistance exercise), s/s to stop exercising, NTG use and calling 911, heart healthy diet, risk factors (smoking, High LDLs), and CRPII. Pt received materials on exercise, diet, and CRPII. Will refer to [facility].    Pt did CRPII in 2021, he is interested in doing it again.  Christen Bame 04/11/2022 1:22 PM

## 2022-04-11 NOTE — Progress Notes (Signed)
TR BAND REMOVAL  LOCATION:    left radial  DEFLATED PER PROTOCOL:    Yes.    TIME BAND OFF / DRESSING APPLIED: 04/11/22 at [place] ARRIVAL:    Level 0  SITE AFTER BAND REMOVAL:    Level 0  CIRCULATION SENSATION AND MOVEMENT:    Within Normal Limits   Yes.    COMMENTS:

## 2022-04-11 NOTE — Discharge Summary (Signed)
Discharge Summary for Same Day PCI   Patient ID: Steven Lawrence MRN: [id number]; DOB: 1951/01/18  Admit date: 04/11/2022 Discharge date: 04/11/2022  Primary Care Provider: Lennie Odor, [facility]  Primary Cardiologist: [name], MD  Primary Electrophysiologist:  None   Discharge Diagnoses    Active Problems:   Coronary artery disease involving native coronary artery of native heart with angina pectoris [facility])    Diagnostic Studies/Procedures    Cardiac Catheterization 04/11/2022:  Left Heart Catheterization 04/11/22  1st Diag lesion is 100% stenosed.   Origin to Prox Graft lesion before 1st Mrg  is 100% stenosed.   Seq SVG- OM1 and OM2.   Severe multivessel CAD with severe LAD stenosis, total occlusion of the LCx (new from prior cath when there was severe calcific stenosis), and total occlusion of the RCA (chronic) S/P CABG with continued patency of the LIMA-LAD without stenosis Total occlusion of the sequential graft to OM1 and OM2 Severe stenosis at the ostium of the SVG-diagonal, treated successfully with a 3.5x16 mm Synergy DES Severe stenosis at the ostium of the SVG-PDA, treated successfully with a 3.5x16 mm Synergy DES Likely normal function of the aortic bioprosthesis, crossed with J-wire, pseudo-gradient from catheter whip artifact   Recommend: Same day PCI protocol if criteria met. ASA/clopidogrel x 12 months favor long-term if tolerated Diagnostic Dominance: Right  Intervention   _____________   History of Present Illness     Steven Lawrence is a 72 y.o. male with a past medical history of CAD s/p CABGx5 in 12/2019, aortic valve replacement, HLD, type 2 DM. Per chart review, patient was admitted in 12/2019 with NSTEMI. Echocardiogram 12/17/19 showed EF 60-65%, no regional wall motion abnormalities, grade I diastolic dysfunction, moderate aortic valve stenosis. Cardiac catheterization showed occluded RCA, 95% circumflex, and 80% LAD. Evaluated by CT surgery for CABG.  Carotid dopplers showed 1-39% stenosis bilaterally. He ultimately underwent CABGx5 with LIMA-LAD, SVG-first diagonal, SVG-OM 2/OM 3, and SVG-PDA along with aortic valve replacement (bovine). Post-op course was compliacated by atrial fibrillation, and he was treated with amiodarone and apixaban. Apixaban was stopped about 8 weeks postop after maintaining NSR.   He was later seen by Dr. Stanford Breed on 03/06/22 for an outpatient appointment, and reported that he was having occasional chest discomfort. He had a cardiac PET scan on 1/16 that was a high risk study and showed reversible ischemia. He was seen after the study in office on 04/07/22 and was set up for cardiac catheterization.   Cardiac catheterization was arranged for further evaluation.  Hospital Course     The patient underwent cardiac cath as noted above with DES to the ostium of the SVG-diagonal and DES to the ostium of the SVG-PDA. Plan for DAPT with ASA/clopidogrel for at least 12 months, but preferably long term if tolerated. The patient was seen by cardiac rehab while in short stay. There were no observed complications post cath. Radial cath site was re-evaluated prior to discharge and found to be stable without any complications. Instructions/precautions regarding cath site care were given prior to discharge.  Steven Lawrence was seen by Dr. Burt Knack and determined stable for discharge home. Follow up with our office has been arranged. Medications are listed below. Pertinent changes include addition of plavix.   _____________  Cath/PCI Registry Performance & Quality Measures: Aspirin prescribed? - Yes ADP Receptor Inhibitor (Plavix/Clopidogrel, Brilinta/Ticagrelor or Effient/Prasugrel) prescribed (includes medically managed patients)? - Yes High Intensity Statin (Lipitor 40-'80mg'$  or Crestor 20-'40mg'$ ) prescribed? - Yes For EF <  40%, was ACEI/ARB prescribed? - Not Applicable (EF >/= 70%) For EF <40%, Aldosterone Antagonist (Spironolactone or  Eplerenone) prescribed? - Not Applicable (EF >/= 35%) Cardiac Rehab Phase II ordered (Included Medically managed Patients)? - Yes  _____________   Discharge Vitals Blood pressure 113/66, pulse 68, temperature 98.5 F (36.9 C), temperature source Oral, resp. rate (!) 24, height '5\' 9"'$  (1.753 m), weight 72.6 kg, SpO2 96 %.  Filed Weights   04/11/22 0846  Weight: 72.6 kg    Last Labs & Radiologic Studies    CBC No results for input(s): "WBC", "NEUTROABS", "HGB", "HCT", "MCV", "PLT" in the last 72 hours. Basic Metabolic Panel No results for input(s): "NA", "K", "CL", "CO2", "GLUCOSE", "BUN", "CREATININE", "CALCIUM", "MG", "PHOS" in the last 72 hours. Liver Function Tests No results for input(s): "AST", "ALT", "ALKPHOS", "BILITOT", "PROT", "ALBUMIN" in the last 72 hours. No results for input(s): "LIPASE", "AMYLASE" in the last 72 hours. High Sensitivity Troponin:   No results for input(s): "TROPONINIHS" in the last 720 hours.  BNP Invalid input(s): "POCBNP" D-Dimer No results for input(s): "DDIMER" in the last 72 hours. Hemoglobin A1C No results for input(s): "HGBA1C" in the last 72 hours. Fasting Lipid Panel No results for input(s): "CHOL", "HDL", "LDLCALC", "TRIG", "CHOLHDL", "LDLDIRECT" in the last 72 hours. Thyroid Function Tests No results for input(s): "TSH", "T4TOTAL", "T3FREE", "THYROIDAB" in the last 72 hours.  Invalid input(s): "FREET3" _____________  CARDIAC CATHETERIZATION  Result Date: 04/11/2022   1st Diag lesion is 100% stenosed.   Origin to Prox Graft lesion before 1st Mrg  is 100% stenosed.   Seq SVG- OM1 and OM2. Severe multivessel CAD with severe LAD stenosis, total occlusion of the LCx (new from prior cath when there was severe calcific stenosis), and total occlusion of the RCA (chronic) S/P CABG with continued patency of the LIMA-LAD without stenosis Total occlusion of the sequential graft to OM1 and OM2 Severe stenosis at the ostium of the SVG-diagonal, treated  successfully with a 3.5x16 mm Synergy DES Severe stenosis at the ostium of the SVG-PDA, treated successfully with a 3.5x16 mm Synergy DES Likely normal function of the aortic bioprosthesis, crossed with J-wire, pseudo-gradient from catheter whip artifact Recommend: Same day PCI protocol if criteria met. ASA/clopidogrel x 12 months favor long-term if tolerated   NM PET CT CARDIAC PERFUSION MULTI W/ABSOLUTE BLOODFLOW  Result Date: 04/01/2022   The study is high risk due to the presence of a large, reversible perfusion defect in the lateral and basal-to-mid inferior LV segments consistent with ischemia, the presence of TID, and a drop in EF with stress. Recommend coronary catheterization if clinically indicated.   LV perfusion is abnormal. There is evidence of ischemia. Defect 1: There is a medium defect with severe reduction in uptake present in the apical to basal anterolateral and inferolateral location(s) that is reversible. There is abnormal wall motion in the defect area. Consistent with ischemia. Defect 2: There is a small defect present in the mid to basal inferior location(s). There is abnormal wall motion in the defect area. Consistent with ischemia.   Rest left ventricular function is normal. Rest EF: 58 %. EF decreased with stress to 50% consistent with high risk findings. End diastolic cavity size is normal. End systolic cavity size is normal.   Myocardial blood flow reserve is not reported in this patient due to history of CABG that affect accuracy. Of note, MBF is most abnormal in the LCx territory which corresponds with perfusion abnormality seen on imaging.  Coronary calcium assessment not performed due to prior revascularization. CLINICAL DATA:  This over-read does not include interpretation of cardiac or coronary anatomy or pathology. The Cardiac PET CT interpretation by the cardiologist is attached. COMPARISON:  Lung cancer screening CT dated November 06, 2021 FINDINGS: Vascular: Normal heart  size. Severe coronary artery calcifications status post CABG. Normal caliber thoracic aorta with moderate calcified plaque. Mediastinum/Nodes: Esophagus is unremarkable. No pathologically enlarged lymph nodes seen in the chest. Lungs/Pleura: Central airways are patent. No consolidation, pleural effusion or pneumothorax. No new or enlarging pulmonary nodules in the visualized portions of the lungs. Upper Abdomen: No acute abnormality. Musculoskeletal: Prior median sternotomy. No acute osseous abnormality. IMPRESSION: 1. No acute findings in the chest. 2. Severe coronary artery calcifications status post CABG. 3. Aortic Atherosclerosis (ICD10-I70.0). Electronically Signed   By: Yetta Glassman M.D.   On: 04/01/2022 13:50   Disposition   Pt is being discharged home today in good condition.  Follow-up Plans & Appointments     Follow-up Information     Ledora Bottcher, [place] Follow up on 04/24/2022.   Specialties: Cardiology, Radiology Why: Appointment at 10:55 AM Contact information: [address] [place] [address] [phone]                Discharge Instructions     Amb Referral to Cardiac Rehabilitation   Complete by: As directed    Diagnosis: Coronary Stents   After initial evaluation and assessments completed: Virtual Based Care may be provided alone or in conjunction with Phase 2 Cardiac Rehab based on patient barriers.: Yes   Intensive Cardiac Rehabilitation (ICR) [place] location only OR Traditional Cardiac Rehabilitation (TCR) *If criteria for ICR are not met will enroll in TCR [facility] only): Yes        Discharge Medications   Allergies as of 04/11/2022   No Known Allergies      Medication List     TAKE these medications    acetaminophen 500 MG tablet Commonly known as: TYLENOL Take 1,000 mg by mouth every 6 (six) hours as needed (Joint pain).   amoxicillin 500 MG capsule Commonly known as: AMOXIL Take 500 mg by mouth once.   aspirin EC 81 MG  tablet Take 1 tablet (81 mg total) by mouth daily. Swallow whole.   atorvastatin 80 MG tablet Commonly known as: LIPITOR Take 80 mg by mouth daily.   canagliflozin 300 MG Tabs tablet Commonly known as: INVOKANA Take 300 mg by mouth daily.   cholestyramine light 4 g Powd Take by mouth daily. 1 Scoop   clopidogrel 75 MG tablet Commonly known as: Plavix Take 1 tablet (75 mg total) by mouth daily.   doxazosin 2 MG tablet Commonly known as: CARDURA Take 2 mg by mouth every morning. Takes for urinary freq   insulin degludec 100 UNIT/ML FlexTouch Pen Commonly known as: TRESIBA Inject 20 Units into the skin at bedtime.   isosorbide mononitrate 30 MG 24 hr tablet Commonly known as: IMDUR Take 1 tablet (30 mg total) by mouth daily.   Levothyroxine Sodium 112 MCG Caps Take 112 mcg by mouth daily before breakfast.   lisinopril 5 MG tablet Commonly known as: ZESTRIL Take 5 mg by mouth daily.   metFORMIN 1000 MG tablet Commonly known as: GLUCOPHAGE Take 1,000 mg by mouth 2 (two) times daily with a meal.   pramipexole 0.125 MG tablet Commonly known as: MIRAPEX Take 0.5 mg by mouth at bedtime.  Allergies No Known Allergies  Outstanding Labs/Studies     Duration of Discharge Encounter   Greater than 30 minutes including physician time.  Signed, Margie Billet, PA-C 04/11/2022, 2:32 PM

## 2022-04-14 ENCOUNTER — Encounter (HOSPITAL_COMMUNITY): Payer: Self-pay | Admitting: Cardiovascular Disease

## 2022-04-14 MED FILL — Verapamil HCl IV Soln 2.5 MG/ML: INTRAVENOUS | Qty: 2 | Status: AC

## 2022-04-14 MED FILL — Nitroglycerin IV Soln 100 MCG/ML in D5W: INTRA_ARTERIAL | Qty: 10 | Status: AC

## 2022-04-16 NOTE — Progress Notes (Signed)
Cardiology Office Note:    Date:  04/24/2022   ID:  Steven Lawrence, DOB Oct 18, 1950, MRN [id number]  PCP:  Lennie Odor, [place] Providers Cardiologist:  [name], MD Cardiology APP:  Ledora Bottcher, PA {  Referring MD: Lennie Odor, [place]   Chief Complaint  Patient presents with   Follow-up    Post cath    History of Present Illness:    Steven Lawrence is a 72 y.o. male with a hx of CAD s/p CABG x 5 in 12/2019, AVR, hyperlipidemia, DM 2.  Patient suffered an NSTEMI 12/2019 that showed severe multivessel disease.  He was referred to CABG and underwent CABG x 5 with LIMA-LAD, SVG-D1, SVG-OM 2/OM 3, and SVG-PDA and AVR with tissue valve 12/2019.  Postop course complicated by A-fib treated with amiodarone and Eliquis.  Eliquis was stopped 8 weeks postop in the setting of maintaining NSR.  He reported chest discomfort when seen in follow-up 03/06/2022 and was referred for a PET stress test that showed a large reversible perfusion defect in the lateral and basal to mid inferior LV segments.  He was referred for outpatient cardiac catheterization.  Heart catheterization on 04/11/2022 showed patent LIMA-LAD but total occlusion of the sequential graft to the OM1-OM2.  A 90% occlusion of the ostial SVG-PDA was treated with a DES 3.5 x 16 mm.  Ostial SVG-diagonal with 90% stenosis treated with DES 3.5 x 16 mm.  He was discharged under same-day PCI protocol on aspirin and Plavix x 12 months but would favor long-term if tolerated.    He presents today for post cath follow-up. He is doing well, no further chest discomfort with walking. He has started walking and is looking forward to cardiac rehab. He is here with his wife. Overall they are concerned about the quick progression of disease. We discuss cholesterol and diet. I review indications for SGLT2i. His PCP is currently changing an injectable medication for DM - I will make the recommendation for SGLT2i and defer to PCP.     Past Medical History:  Diagnosis Date   Acid reflux    Arthritis    BPH (benign prostatic hyperplasia)    mild   CAD in native artery    a. NSTEMI 12/2019 s/p CABGx5 with bovine AVR.   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[place])    type II   History of pneumonia as a child    Hypercholesteremia    Hypothyroidism    RLS (restless legs syndrome)    S/P aortic valve replacement with bioprosthetic valve    S/P CABG (coronary artery bypass graft)    Sleep apnea    does not use a cpap,primarily with upper airway resistance syndrome AHI 2.35/hr, RDI 10.2/hr ([name])   Urinary frequency    Wears glasses    Wears hearing aid    both ears    Past Surgical History:  Procedure Laterality Date   ANTERIOR LAT LUMBAR FUSION Left 01/04/2015   Procedure: ANTERIOR LATERAL LUMBAR FUSION 1 LEVEL;  Surgeon: Phylliss Bob, MD;  Location: [facility];  Service: Orthopedics;  Laterality: Left;  Left sided lateral interbody fusion lumbar 3-4 with instrumentation and allograft   AORTIC VALVE REPLACEMENT N/A 12/21/2019   Procedure: AORTIC VALVE REPLACEMENT WITH INSPIRIS RESILIA AORTIC VALVE SIZE 23MM;  Surgeon: Melrose Nakayama, MD;  Location: [facility];  Service: Open Heart Surgery;  Laterality: N/A;   COLONOSCOPY     CORONARY ARTERY  BYPASS GRAFT N/A 12/21/2019   Procedure: CORONARY ARTERY BYPASS GRAFTING TIMES FIVE USING LEFT INTERNAL [employer] ARTERY AND ENDOSCOPICALLY HARVESTED RIGHT GREATER SAPHENOUS VEIN.;  Surgeon: Melrose Nakayama, MD;  Location: [facility];  Service: Open Heart Surgery;  Laterality: N/A;   CORONARY STENT INTERVENTION N/A 04/11/2022   Procedure: CORONARY STENT INTERVENTION;  Surgeon: Sherren Mocha, MD;  Location: [facility];  Service: Cardiovascular;  Laterality: N/A;   ENDOVEIN HARVEST OF GREATER SAPHENOUS VEIN  12/21/2019   Procedure: ENDOVEIN HARVEST OF RIGHT GREATER SAPHENOUS VEIN;  Surgeon: Melrose Nakayama, MD;  Location: [facility];  Service:  Open Heart Surgery;;   ESOPHAGOGASTRODUODENOSCOPY     INGUINAL HERNIA REPAIR Right 08/12/2012   Procedure: HERNIA REPAIR INGUINAL ADULT;  Surgeon: Imogene Burn. Georgette Dover, MD;  Location: [facility];  Service: General;  Laterality: Right;   INSERTION OF MESH Right 08/12/2012   Procedure: INSERTION OF MESH;  Surgeon: Imogene Burn. Georgette Dover, MD;  Location: [facility];  Service: General;  Laterality: Right;   LEFT HEART CATH AND CORONARY ANGIOGRAPHY N/A 12/19/2019   Procedure: LEFT HEART CATH AND CORONARY ANGIOGRAPHY;  Surgeon: Lorretta Harp, MD;  Location: [facility];  Service: Cardiovascular;  Laterality: N/A;   LEFT HEART CATH AND CORS/GRAFTS ANGIOGRAPHY N/A 04/11/2022   Procedure: LEFT HEART CATH AND CORS/GRAFTS ANGIOGRAPHY;  Surgeon: Sherren Mocha, MD;  Location: [facility];  Service: Cardiovascular;  Laterality: N/A;   TEE WITHOUT CARDIOVERSION N/A 12/21/2019   Procedure: TRANSESOPHAGEAL ECHOCARDIOGRAM (TEE);  Surgeon: Melrose Nakayama, MD;  Location: [address];  Service: Open Heart Surgery;  Laterality: N/A;   TONSILLECTOMY     VASECTOMY  1985    Current Medications: Current Meds  Medication Sig   acetaminophen (TYLENOL) 500 MG tablet Take 1,000 mg by mouth every 6 (six) hours as needed (Joint pain).   aspirin EC 81 MG EC tablet Take 1 tablet (81 mg total) by mouth daily. Swallow whole.   atorvastatin (LIPITOR) 80 MG tablet Take 80 mg by mouth daily.    canagliflozin (INVOKANA) 300 MG TABS tablet Take 300 mg by mouth daily.   cholestyramine light 4 g POWD Take by mouth daily. 1 Scoop   doxazosin (CARDURA) 2 MG tablet Take 2 mg by mouth every morning. Takes for urinary freq   insulin degludec (TRESIBA) 100 UNIT/ML FlexTouch Pen Inject 20 Units into the skin at bedtime.   isosorbide mononitrate (IMDUR) 30 MG 24 hr tablet Take 1 tablet (30 mg total) by mouth daily.   Levothyroxine Sodium 112 MCG CAPS Take 112 mcg by mouth daily before breakfast.    lisinopril (ZESTRIL) 5 MG tablet Take 5 mg by mouth daily.   metFORMIN (GLUCOPHAGE) 1000 MG tablet Take 1,000 mg by mouth 2 (two) times daily with a meal.   nitroGLYCERIN (NITROSTAT) 0.4 MG SL tablet Place 1 tablet (0.4 mg total) under the tongue every 5 (five) minutes as needed for chest pain.   pramipexole (MIRAPEX) 0.125 MG tablet Take 0.5 mg by mouth at bedtime.   [DISCONTINUED] clopidogrel (PLAVIX) 75 MG tablet Take 1 tablet (75 mg total) by mouth daily.     Allergies:   Patient has no known allergies.   Social History   Socioeconomic History   Marital status: Married    Spouse name: Not on file   Number of children: Not on file   Years of education: Not on file   Highest education level: Not on file  Occupational History  Not on file  Tobacco Use   Smoking status: Former    Packs/day: 1.00    Years: 53.00    Total pack years: 53.00    Types: Cigarettes    Quit date: 12/16/2019    Years since quitting: 2.3   Smokeless tobacco: Never  Substance and Sexual Activity   Alcohol use: Yes    Comment: occ   Drug use: No   Sexual activity: Not on file  Other Topics Concern   Not on file  Social History Narrative   Not on file   Social Determinants of Health   Financial Resource Strain: Not on file  Food Insecurity: Not on file  Transportation Needs: Not on file  Physical Activity: Not on file  Stress: Not on file  Social Connections: Not on file     Family History: The patient's family history includes Cancer in his father; Diabetes in his brother, mother, and sister; Heart disease in his brother, father, and mother; Multiple sclerosis in his daughter; Parkinson's disease in his mother.  ROS:   Please see the history of present illness.     All other systems reviewed and are negative.  EKGs/Labs/Other Studies Reviewed:    The following studies were reviewed today:  Left Heart Catheterization 04/11/22  1st Diag lesion is 100% stenosed.   Origin to Prox Graft  lesion before 1st Mrg  is 100% stenosed.   Seq SVG- OM1 and OM2.   Severe multivessel CAD with severe LAD stenosis, total occlusion of the LCx (new from prior cath when there was severe calcific stenosis), and total occlusion of the RCA (chronic) S/P CABG with continued patency of the LIMA-LAD without stenosis Total occlusion of the sequential graft to OM1 and OM2 Severe stenosis at the ostium of the SVG-diagonal, treated successfully with a 3.5x16 mm Synergy DES Severe stenosis at the ostium of the SVG-PDA, treated successfully with a 3.5x16 mm Synergy DES Likely normal function of the aortic bioprosthesis, crossed with J-wire, pseudo-gradient from catheter whip artifact   Recommend: Same day PCI protocol if criteria met. ASA/clopidogrel x 12 months favor long-term if tolerated Diagnostic Dominance: Right  Intervention     EKG:  EKG is  ordered today.  The ekg ordered today demonstrates sinus rhythm with HR 82  Recent Labs: 04/07/2022: BUN 20; Creatinine, Ser 0.80; Hemoglobin 12.7; Platelets 184; Potassium 5.0; Sodium 143  Recent Lipid Panel    Component Value Date/Time   CHOL 115 09/05/2020 0943   TRIG 48 09/05/2020 0943   HDL 55 09/05/2020 0943   CHOLHDL 2.1 09/05/2020 0943   CHOLHDL 2.5 12/20/2019 0537   VLDL 9 12/20/2019 0537   LDLCALC 48 09/05/2020 0943     Risk Assessment/Calculations:                Physical Exam:    VS:  BP 128/72   Pulse 82   Ht '5\' 9"'$  (1.753 m)   Wt 162 lb 12.8 oz (73.8 kg)   SpO2 97%   BMI 24.04 kg/m     Wt Readings from Last 3 Encounters:  04/24/22 162 lb 12.8 oz (73.8 kg)  04/11/22 160 lb (72.6 kg)  04/07/22 164 lb 12.8 oz (74.8 kg)     GEN:  Well nourished, well developed in no acute distress HEENT: Normal NECK: No JVD; No carotid bruits LYMPHATICS: No lymphadenopathy CARDIAC: RRR, no murmurs, rubs, gallops RESPIRATORY:  Clear to auscultation without rales, wheezing or rhonchi  ABDOMEN: Soft, non-tender,  non-distended MUSCULOSKELETAL:  No edema; No deformity  SKIN: Warm and dry NEUROLOGIC:  Alert and oriented x 3 PSYCHIATRIC:  Normal affect  Left radial cath site C/D/I  ASSESSMENT:    1. Coronary artery disease involving native coronary artery of native heart with angina pectoris ([place])   2. CAD S/P percutaneous coronary angioplasty   3. S/P CABG (coronary artery bypass graft)   4. S/P AVR (aortic valve replacement)   5. Hyperlipidemia with target LDL less than 70   6. Essential hypertension   7. Type 2 diabetes mellitus without complication, with long-term current use of insulin (HCC)    PLAN:    In order of problems listed above:  CAD s/p CABG x 5 with subsequent PCI S/P AVR DES-SVG-OM1/OM 2, DES-SVG-PDA DAPT with aspirin and Plavix x 12 months, but consider long-term No further chest pain, feels well. Cath site C/D/I He has started walking   Hyperlipidemia with LDL goal less than 70 Would consider a lower LDL goal given progression of disease after CABG and diabetes 80 mg Lipitor LDL in 02/2021 47 Has not had repeat lipid panel - consider adding LP(a) - not fasting today. Will collect at next visit.   Hypertension On 2 mg Cardura daily for urinary frequency, 30 mg Imdur, 5 mg lisinopril BP well controlled   DM Metformin, Tresiba, Invokana Consider SGLT2 inhibitor - will defer to PCP who is actively changing DM regimen. He is a good candidate for farxiga/jardiance   Follow up in 3 months.     Cardiac Rehabilitation Eligibility Assessment  The patient is ready to start cardiac rehabilitation from a cardiac standpoint.          Medication Adjustments/Labs and Tests Ordered: Current medicines are reviewed at length with the patient today.  Concerns regarding medicines are outlined above.  Orders Placed This Encounter  Procedures   EKG 12-Lead   Meds ordered this encounter  Medications   nitroGLYCERIN (NITROSTAT) 0.4 MG SL tablet    Sig: Place 1 tablet  (0.4 mg total) under the tongue every 5 (five) minutes as needed for chest pain.    Dispense:  25 tablet    Refill:  3   clopidogrel (PLAVIX) 75 MG tablet    Sig: Take 1 tablet (75 mg total) by mouth daily.    Dispense:  30 tablet    Refill:  11    Patient Instructions  Medication Instructions:  Nitorglycerin 0.4 mg ( As Directed). *If you need a refill on your cardiac medications before your next appointment, please call your pharmacy*   Lab Work: No Labs If you have labs (blood work) drawn today and your tests are completely normal, you will receive your results only by: [place] (if you have MyChart) OR A paper copy in the mail If you have any lab test that is abnormal or we need to change your treatment, we will call you to review the results.   Testing/Procedures: No Testing   Follow-Up: At [facility], you and your health needs are our priority.  As part of our continuing mission to provide you with exceptional heart care, we have created designated Provider Care Teams.  These Care Teams include your primary Cardiologist (physician) and Advanced Practice Providers (APPs -  Physician Assistants and Nurse Practitioners) who all work together to provide you with the care you need, when you need it.  We recommend signing up for the patient portal called "MyChart".  Sign up information is provided on this After Visit Summary.  MyChart is used to connect with patients for Virtual Visits (Telemedicine).  Patients are able to view lab/test results, encounter notes, upcoming appointments, etc.  Non-urgent messages can be sent to your provider as well.   To learn more about what you can do with MyChart, go to [email or web address].    Your next appointment:   3 month(s)  Provider:   [name], MD     Signed, Tami Lin Anielle Headrick, PA  04/24/2022 1:12 PM    [place]

## 2022-04-22 ENCOUNTER — Telehealth (HOSPITAL_COMMUNITY): Payer: Self-pay

## 2022-04-22 NOTE — Telephone Encounter (Signed)
Called patient to see if he is interested in the Cardiac Rehab Program. Patient expressed interest. Explained scheduling process and went over insurance, patient verbalized understanding. Will contact patient for scheduling once f/u has been completed.  °

## 2022-04-22 NOTE — Telephone Encounter (Signed)
Pt insurance is active and benefits verified through Medicare a/b Co-pay 0, DED $240/$240 met, out of pocket 0/0 met, co-insurance 20%. no pre-authorization required. Passport, 04/22/2022'@9'$ :22am, REF# [id number]   2ndary insurance is active and benefits verified through [employer]. Co-pay 0, DED 0/0 met, out of pocket 0/0 met, co-insurance 0%. No pre-authorization required.   How many CR sessions are covered? (72 sessions for ICR)72 Is this a lifetime maximum or an annual maximum? annual Has the member used any of these services to date? no Is there a time limit (weeks/months) on start of program and/or program completion? no

## 2022-04-24 ENCOUNTER — Encounter: Payer: Self-pay | Admitting: Physician Assistant

## 2022-04-24 ENCOUNTER — Ambulatory Visit: Payer: Medicare Other | Attending: Physician Assistant | Admitting: Physician Assistant

## 2022-04-24 VITALS — BP 128/72 | HR 82 | Ht 69.0 in | Wt 162.8 lb

## 2022-04-24 DIAGNOSIS — I25119 Atherosclerotic heart disease of native coronary artery with unspecified angina pectoris: Secondary | ICD-10-CM

## 2022-04-24 DIAGNOSIS — E785 Hyperlipidemia, unspecified: Secondary | ICD-10-CM

## 2022-04-24 DIAGNOSIS — Z794 Long term (current) use of insulin: Secondary | ICD-10-CM | POA: Diagnosis not present

## 2022-04-24 DIAGNOSIS — Z951 Presence of aortocoronary bypass graft: Secondary | ICD-10-CM | POA: Diagnosis not present

## 2022-04-24 DIAGNOSIS — E119 Type 2 diabetes mellitus without complications: Secondary | ICD-10-CM | POA: Diagnosis not present

## 2022-04-24 DIAGNOSIS — Z952 Presence of prosthetic heart valve: Secondary | ICD-10-CM

## 2022-04-24 DIAGNOSIS — I10 Essential (primary) hypertension: Secondary | ICD-10-CM

## 2022-04-24 DIAGNOSIS — Z9861 Coronary angioplasty status: Secondary | ICD-10-CM

## 2022-04-24 DIAGNOSIS — I2510 Atherosclerotic heart disease of native coronary artery without angina pectoris: Secondary | ICD-10-CM

## 2022-04-24 MED ORDER — CLOPIDOGREL BISULFATE 75 MG PO TABS: 75.0000 mg | ORAL_TABLET | Freq: Every day | ORAL | 11 refills | Status: DC

## 2022-04-24 MED ORDER — NITROGLYCERIN 0.4 MG SL SUBL: 0.4000 mg | SUBLINGUAL_TABLET | SUBLINGUAL | 3 refills | Status: DC | PRN

## 2022-04-24 NOTE — Patient Instructions (Signed)
Medication Instructions:  Nitorglycerin 0.4 mg ( As Directed). *If you need a refill on your cardiac medications before your next appointment, please call your pharmacy*   Lab Work: No Labs If you have labs (blood work) drawn today and your tests are completely normal, you will receive your results only by: [place] (if you have MyChart) OR A paper copy in the mail If you have any lab test that is abnormal or we need to change your treatment, we will call you to review the results.   Testing/Procedures: No Testing   Follow-Up: At [facility], you and your health needs are our priority.  As part of our continuing mission to provide you with exceptional heart care, we have created designated Provider Care Teams.  These Care Teams include your primary Cardiologist (physician) and Advanced Practice Providers (APPs -  Physician Assistants and Nurse Practitioners) who all work together to provide you with the care you need, when you need it.  We recommend signing up for the patient portal called "MyChart".  Sign up information is provided on this After Visit Summary.  MyChart is used to connect with patients for Virtual Visits (Telemedicine).  Patients are able to view lab/test results, encounter notes, upcoming appointments, etc.  Non-urgent messages can be sent to your provider as well.   To learn more about what you can do with MyChart, go to [email or web address].    Your next appointment:   3 month(s)  Provider:   [name], MD

## 2022-05-09 DIAGNOSIS — G5601 Carpal tunnel syndrome, right upper limb: Secondary | ICD-10-CM | POA: Diagnosis not present

## 2022-05-12 ENCOUNTER — Telehealth: Payer: Self-pay | Admitting: Cardiology

## 2022-05-12 DIAGNOSIS — H04123 Dry eye syndrome of bilateral lacrimal glands: Secondary | ICD-10-CM | POA: Diagnosis not present

## 2022-05-12 NOTE — Telephone Encounter (Addendum)
   Name:  Steven Lawrence  DOB:  1950/04/20  MRN:  [id number]   Primary Cardiologist: [name], MD  Chart reviewed as part of pre-operative protocol coverage. Patient was contacted 05/12/2022 in reference to pre-operative risk assessment for pending surgery as outlined below.  [name] was last seen on 04/24/22 by Fabian Sharp PA-C. This visit was to follow up his recent cath procedure 04/11/22 in which he received 2 new stents. At time of recent PCI 04/11/22, it was recommended to continue uninterrupted aspirin plus clopidogrel/Plavix uninterrupted for 12 months (through 03/2023). Therefore, the patient is not eligible to hold Plavix or proceed for elective surgery at this time. Would recommend to re-submit clearance request closer to the 1-yeark mark from recent stenting. He has ongoing follow-up 07/2022 with our office.  Will route this bundled recommendation to requesting provider via Epic fax function. Please call with questions.   Charlie Pitter, PA-C 05/12/2022, 9:49 AM

## 2022-05-12 NOTE — Telephone Encounter (Signed)
   Pre-operative Risk Assessment    Patient Name: Steven Lawrence  DOB: [birth year] MRN: [id number]      Request for Surgical Clearance    Procedure:      Carpal Tunnel Release  Date of Surgery:  Clearance 06/03/22                                 Surgeon:  Dr. Tamera Punt Surgeon's Group or Practice Name:  [facility] Phone number:  [phone] Fax number:  [phone]   Type of Clearance Requested:   - Medical  - Pharmacy:  Hold Clopidogrel (Plavix) Defer to cards   Type of Anesthesia:  MAC   Additional requests/questions:    Karie Soda   05/12/2022, 9:38 AM

## 2022-05-13 ENCOUNTER — Telehealth (HOSPITAL_COMMUNITY): Payer: Self-pay

## 2022-05-13 NOTE — Telephone Encounter (Signed)
Called patient to see if he was interested in participating in the Cardiac Rehab Program. Patient stated yes. Patient will come in for orientation on 05/14/22 @ 8AM and will attend the 10:15AM exercise class.

## 2022-05-13 NOTE — Telephone Encounter (Signed)
Called pt to confirm appt for 05/14/22 at 800. Reviewed H/Hx with pt and answered all questions.  [name], MS 05/13/2022 3:24 PM

## 2022-05-14 ENCOUNTER — Encounter (HOSPITAL_COMMUNITY)
Admission: RE | Admit: 2022-05-14 | Discharge: 2022-05-14 | Disposition: A | Discharge status: Home / Self Care | Payer: Medicare Other | Source: Ambulatory Visit | Attending: Cardiology | Admitting: Cardiology

## 2022-05-14 VITALS — BP 102/56 | HR 75 | Ht 69.25 in | Wt 163.8 lb

## 2022-05-14 DIAGNOSIS — Z955 Presence of coronary angioplasty implant and graft: Secondary | ICD-10-CM | POA: Insufficient documentation

## 2022-05-14 LAB — GLUCOSE, CAPILLARY: Glucose-Capillary: 165 mg/dL — ABNORMAL HIGH (ref 70–99)

## 2022-05-14 NOTE — Progress Notes (Signed)
Cardiac Rehab Medication Review   Does the patient  feel that his/her medications are working for him/her?  YES  Has the patient been experiencing any side effects to the medications prescribed?  NO  Does the patient measure his/her own blood pressure or blood glucose at home?  YES   Does the patient have any problems obtaining medications due to transportation or finances?   NO  Understanding of regimen: excellent Understanding of indications: excellent Potential of compliance: excellent    Comments: Steven Lawrence has a good understanding of his medications and regime. He checks his blood glucose at home regularly and has a finger pulse ox as well, but does not check his BP or have a BP cuff.     [name], MS 05/14/2022 9:46 AM

## 2022-05-14 NOTE — Progress Notes (Signed)
Cardiac Individual Treatment Plan  Patient Details  Name: Steven Lawrence MRN: [id number] Date of Birth: [birth year] Referring Provider:   [place] from 05/14/2022 in [facility] [place]  Referring Provider [name], MD       Initial Encounter Date:  [place] from 05/14/2022 in [facility]  Date 05/14/22       Visit Diagnosis: Status post coronary artery stent placement  Patient's Home Medications on Admission:  Current Outpatient Medications:    acetaminophen (TYLENOL) 500 MG tablet, Take 1,000 mg by mouth every 6 (six) hours as needed (Joint pain)., Disp: , Rfl:    aspirin EC 81 MG EC tablet, Take 1 tablet (81 mg total) by mouth daily. Swallow whole., Disp: 30 tablet, Rfl: 11   atorvastatin (LIPITOR) 80 MG tablet, Take 80 mg by mouth daily. , Disp: , Rfl:    canagliflozin (INVOKANA) 300 MG TABS tablet, Take 300 mg by mouth daily., Disp: , Rfl:    cholestyramine light 4 g POWD, Take by mouth daily. 1 Scoop, Disp: , Rfl:    clopidogrel (PLAVIX) 75 MG tablet, Take 1 tablet (75 mg total) by mouth daily., Disp: 30 tablet, Rfl: 11   doxazosin (CARDURA) 2 MG tablet, Take 2 mg by mouth every morning. Takes for urinary freq, Disp: , Rfl:    insulin degludec (TRESIBA) 100 UNIT/ML FlexTouch Pen, Inject 20 Units into the skin at bedtime., Disp: , Rfl:    isosorbide mononitrate (IMDUR) 30 MG 24 hr tablet, Take 1 tablet (30 mg total) by mouth daily., Disp: 90 tablet, Rfl: 3   Levothyroxine Sodium 112 MCG CAPS, Take 112 mcg by mouth daily before breakfast., Disp: , Rfl:    lisinopril (ZESTRIL) 5 MG tablet, Take 5 mg by mouth daily., Disp: , Rfl:    metFORMIN (GLUCOPHAGE) 1000 MG tablet, Take 1,000 mg by mouth 2 (two) times daily with a meal., Disp: , Rfl:    nitroGLYCERIN (NITROSTAT) 0.4 MG SL tablet, Place 1 tablet  (0.4 mg total) under the tongue every 5 (five) minutes as needed for chest pain., Disp: 25 tablet, Rfl: 3   pramipexole (MIRAPEX) 0.125 MG tablet, Take 0.5 mg by mouth at bedtime., Disp: , Rfl:   Past Medical History: Past Medical History:  Diagnosis Date   Acid reflux    Arthritis    BPH (benign prostatic hyperplasia)    mild   CAD in native artery    a. NSTEMI 12/2019 s/p CABGx5 with bovine AVR.   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, RDI 10.2/hr ([name])   Urinary frequency    Wears glasses    Wears hearing aid    both ears    Tobacco Use: Social History   Tobacco Use  Smoking Status Former   Packs/day: 1.00   Years: 53.00   Total pack years: 53.00   Types: Cigarettes   Quit date: 12/16/2019   Years since quitting: 2.4  Smokeless Tobacco Never  Labs: Review Flowsheet  More data exists      Latest Ref Rng & Units 12/17/2019 12/20/2019 12/21/2019 04/10/2020 09/05/2020  Labs for ITP Cardiac and Pulmonary Rehab  Cholestrol 100 - 199 mg/dL - 124  - 135  115   LDL (calc) 0 - 99 mg/dL - 66  - 75  48   HDL-C >39 mg/dL - 49  - 47  55   Trlycerides 0 - 149 mg/dL - 47  - 63  48   Hemoglobin A1c 4.8 - 5.6 % 7.1  - - - -  PH, Arterial 7.350 - 7.450 - - 7.357  7.404  7.377  7.438  7.437  7.338  7.347  7.411  - -  PCO2 arterial 32.0 - 48.0 mmHg - - 37.2  33.2  37.9  37.1  37.3  47.3  47.8  37.3  - -  Bicarbonate 20.0 - 28.0 mmol/L - - 20.8  20.8  22.6  25.1  25.1  25.4  26.2  23.2  - -  TCO2 22 - 32 mmol/L - - '22  22  24  23  26  26  26  27  27  28  24  25  '$ - -  Acid-base deficit 0.0 - 2.0 mmol/L - - 4.0  3.0  3.0  1.0  0.8  - -  O2  Saturation % - - 97.0  99.0  99.0  100.0  100.0  89.0  100.0  97.9  - -    Capillary Blood Glucose: Lab Results  Component Value Date   GLUCAP 165 (H) 05/14/2022   GLUCAP 138 (H) 04/11/2022   GLUCAP 113 (H) 03/21/2020   GLUCAP 138 (H) 03/19/2020   GLUCAP 171 (H) 03/19/2020     Exercise Target Goals: Exercise Program Goal: Individual exercise prescription set using results from initial 6 min walk test and THRR while considering  patient's activity barriers and safety.   Exercise Prescription Goal: Initial exercise prescription builds to 30-45 minutes a day of aerobic activity, 2-3 days per week.  Home exercise guidelines will be given to patient during program as part of exercise prescription that the participant will acknowledge.  Activity Barriers & Risk Stratification:  Activity Barriers & Cardiac Risk Stratification - 05/14/22 0951       Activity Barriers & Cardiac Risk Stratification   Activity Barriers Arthritis;Back Problems;Joint Problems;Muscular Weakness    Cardiac Risk Stratification High   < 5 METs on 6MWT            6 Minute Walk:  6 Minute Walk     Row Name 05/14/22 0949         6 Minute Walk   Phase Initial     Distance 1829 feet     Walk Time 6 minutes     # of Rest Breaks 0     MPH 3.46     METS 4.39     RPE 12     Perceived Dyspnea  0     VO2 Peak 15.35     Symptoms Yes (comment)     Comments a little winded, "feels like i worked hard"     Resting HR 75 bpm     Resting BP 102/56     Resting Oxygen Saturation  95 %     Exercise Oxygen Saturation  during 6 min walk 95 %     Max Ex. HR 125 bpm  advised to slow down to target  Max Ex. BP 144/62     2 Minute Post BP 120/64              Oxygen Initial Assessment:   Oxygen Re-Evaluation:   Oxygen Discharge (Final Oxygen Re-Evaluation):   Initial Exercise Prescription:  Initial Exercise Prescription - 05/14/22 0900       Date of Initial Exercise RX and Referring Provider    Date 05/14/22    Referring Provider [name], MD    Expected Discharge Date 07/25/22      Treadmill   MPH 2.5    Grade 0    Minutes 15    METs 3.5      Arm Ergometer   Level 2    RPM 40    Minutes 15    METs 3.5      Prescription Details   Frequency (times per week) 3    Duration Progress to 30 minutes of continuous aerobic without signs/symptoms of physical distress      Intensity   THRR 40-80% of Max Heartrate 59-119    Ratings of Perceived Exertion 11-13    Perceived Dyspnea 0-4      Progression   Progression Continue progressive overload as per policy without signs/symptoms or physical distress.      Resistance Training   Training Prescription Yes    Weight 3    Reps 10-15             Perform Capillary Blood Glucose checks as needed.  Exercise Prescription Changes:   Exercise Comments:   Exercise Goals and Review:   Exercise Goals     Row Name 05/14/22 0955             Exercise Goals   Increase Physical Activity Yes       Intervention Provide advice, education, support and counseling about physical activity/exercise needs.;Develop an individualized exercise prescription for aerobic and resistive training based on initial evaluation findings, risk stratification, comorbidities and participant's personal goals.       Expected Outcomes Short Term: Attend rehab on a regular basis to increase amount of physical activity.;Long Term: Exercising regularly at least 3-5 days a week.;Long Term: Add in home exercise to make exercise part of routine and to increase amount of physical activity.       Increase Strength and Stamina Yes       Intervention Provide advice, education, support and counseling about physical activity/exercise needs.;Develop an individualized exercise prescription for aerobic and resistive training based on initial evaluation findings, risk stratification, comorbidities and participant's personal goals.       Expected Outcomes Short  Term: Increase workloads from initial exercise prescription for resistance, speed, and METs.;Short Term: Perform resistance training exercises routinely during rehab and add in resistance training at home;Long Term: Improve cardiorespiratory fitness, muscular endurance and strength as measured by increased METs and functional capacity (6MWT)       Able to understand and use rate of perceived exertion (RPE) scale Yes       Intervention Provide education and explanation on how to use RPE scale       Expected Outcomes Short Term: Able to use RPE daily in rehab to express subjective intensity level;Long Term:  Able to use RPE to guide intensity level when exercising independently       Knowledge and understanding of Target Heart Rate Range (THRR) Yes       Intervention Provide education and explanation of THRR including how the numbers were predicted and where they  are located for reference       Expected Outcomes Short Term: Able to state/look up THRR;Long Term: Able to use THRR to govern intensity when exercising independently;Short Term: Able to use daily as guideline for intensity in rehab       Understanding of Exercise Prescription Yes       Intervention Provide education, explanation, and written materials on patient's individual exercise prescription       Expected Outcomes Short Term: Able to explain program exercise prescription;Long Term: Able to explain home exercise prescription to exercise independently                Exercise Goals Re-Evaluation :   Discharge Exercise Prescription (Final Exercise Prescription Changes):   Nutrition:  Target Goals: Understanding of nutrition guidelines, daily intake of sodium '1500mg'$ , cholesterol '200mg'$ , calories 30% from fat and 7% or less from saturated fats, daily to have 5 or more servings of fruits and vegetables.  Biometrics:  Pre Biometrics - 05/14/22 0940       Pre Biometrics   Waist Circumference 37.5 inches    Hip Circumference 39  inches    Waist to Hip Ratio 0.96 %    Triceps Skinfold 20 mm    % Body Fat 26.2 %    Grip Strength 34 kg    Flexibility 15.25 in    Single Leg Stand 6.12 seconds              Nutrition Therapy Plan and Nutrition Goals:   Nutrition Assessments:  MEDIFICTS Score Key: ?70 Need to make dietary changes  40-70 Heart Healthy Diet ? 40 Therapeutic Level Cholesterol Diet     Picture Your Plate Scores: [id number] Unhealthy dietary pattern with much room for improvement. 41-50 Dietary pattern unlikely to meet recommendations for good health and room for improvement. 51-60 More healthful dietary pattern, with some room for improvement.  >60 Healthy dietary pattern, although there may be some specific behaviors that could be improved.    Nutrition Goals Re-Evaluation:   Nutrition Goals Re-Evaluation:   Nutrition Goals Discharge (Final Nutrition Goals Re-Evaluation):   Psychosocial: Target Goals: Acknowledge presence or absence of significant depression and/or stress, maximize coping skills, provide positive support system. Participant is able to verbalize types and ability to use techniques and skills needed for reducing stress and depression.  Initial Review & Psychosocial Screening:  Initial Psych Review & Screening - 05/14/22 0942       Initial Review   Current issues with None Identified      Family Dynamics   Good Support System? Yes   Ghazi has his wife for support   Comments Haran presents to cardiac rehab with no feelings of depression, stress, or anxiety. He is excited to start the new intensive cardiac rehab education.      Barriers   Psychosocial barriers to participate in program There are no identifiable barriers or psychosocial needs.      Screening Interventions   Interventions Encouraged to exercise;To provide support and resources with identified psychosocial needs;Provide feedback about the scores to participant    Expected Outcomes Short Term goal:  Identification and review with participant of any Quality of Life or Depression concerns found by scoring the questionnaire.;Long Term goal: The participant improves quality of Life and PHQ9 Scores as seen by post scores and/or verbalization of changes             Quality of Life Scores:  Quality of Life - 05/14/22 0957  Quality of Life   Select Quality of Life      Quality of Life Scores   Health/Function Pre 25.7 %    Socioeconomic Pre 28.07 %    Psych/Spiritual Pre 27.93 %    Family Pre 28.8 %    GLOBAL Pre 27.1 %            Scores of 19 and below usually indicate a poorer quality of life in these areas.  A difference of  2-3 points is a clinically meaningful difference.  A difference of 2-3 points in the total score of the Quality of Life Index has been associated with significant improvement in overall quality of life, self-image, physical symptoms, and general health in studies assessing change in quality of life.  PHQ-9: Review Flowsheet       05/14/2022 05/10/2020 03/13/2020  Depression screen PHQ 2/9  Decreased Interest 0 0 0  Down, Depressed, Hopeless 1 0 0  PHQ - 2 Score 1 0 0  Altered sleeping 0 - -  Tired, decreased energy 1 - -  Change in appetite 0 - -  Feeling bad or failure about yourself  0 - -  Trouble concentrating 0 - -  Moving slowly or fidgety/restless 0 - -  Suicidal thoughts 0 - -  PHQ-9 Score 2 - -  Difficult doing work/chores Not difficult at all - -   Interpretation of Total Score  Total Score Depression Severity:  1-4 = Minimal depression, 5-9 = Mild depression, 10-14 = Moderate depression, 15-19 = Moderately severe depression, 20-27 = Severe depression   Psychosocial Evaluation and Intervention:   Psychosocial Re-Evaluation:   Psychosocial Discharge (Final Psychosocial Re-Evaluation):   Vocational Rehabilitation: Provide vocational rehab assistance to qualifying candidates.   Vocational Rehab Evaluation & Intervention:   Vocational Rehab - 05/14/22 0946       Initial Vocational Rehab Evaluation & Intervention   Assessment shows need for Vocational Rehabilitation No   Hershy is retired            Education: Education Goals: Education classes will be provided on a weekly basis, covering required topics. Participant will state understanding/return demonstration of topics presented.     Core Videos: Exercise    Move It!  Clinical staff conducted group or individual video education with verbal and written material and guidebook.  Patient learns the recommended Pritikin exercise program. Exercise with the goal of living a long, healthy life. Some of the health benefits of exercise include controlled diabetes, healthier blood pressure levels, improved cholesterol levels, improved heart and lung capacity, improved sleep, and better body composition. Everyone should speak with their doctor before starting or changing an exercise routine.  Biomechanical Limitations Clinical staff conducted group or individual video education with verbal and written material and guidebook.  Patient learns how biomechanical limitations can impact exercise and how we can mitigate and possibly overcome limitations to have an impactful and balanced exercise routine.  Body Composition Clinical staff conducted group or individual video education with verbal and written material and guidebook.  Patient learns that body composition (ratio of muscle mass to fat mass) is a key component to assessing overall fitness, rather than body weight alone. Increased fat mass, especially visceral belly fat, can put [place] at increased risk for metabolic syndrome, type 2 diabetes, heart disease, and even death. It is recommended to combine diet and exercise (cardiovascular and resistance training) to improve your body composition. Seek guidance from your physician and exercise physiologist before implementing an exercise  routine.  Exercise Action  Plan Clinical staff conducted group or individual video education with verbal and written material and guidebook.  Patient learns the recommended strategies to achieve and enjoy long-term exercise adherence, including variety, self-motivation, self-efficacy, and positive decision making. Benefits of exercise include fitness, good health, weight management, more energy, better sleep, less stress, and overall well-being.  Medical   Heart Disease Risk Reduction Clinical staff conducted group or individual video education with verbal and written material and guidebook.  Patient learns our heart is our most vital organ as it circulates oxygen, nutrients, white blood cells, and hormones throughout the entire body, and carries waste away. Data supports a plant-based eating plan like the Pritikin Program for its effectiveness in slowing progression of and reversing heart disease. The video provides a number of recommendations to address heart disease.   Metabolic Syndrome and Belly Fat  Clinical staff conducted group or individual video education with verbal and written material and guidebook.  Patient learns what metabolic syndrome is, how it leads to heart disease, and how one can reverse it and keep it from coming back. You have metabolic syndrome if you have 3 of the following 5 criteria: abdominal obesity, high blood pressure, high triglycerides, low HDL cholesterol, and high blood sugar.  Hypertension and Heart Disease Clinical staff conducted group or individual video education with verbal and written material and guidebook.  Patient learns that high blood pressure, or hypertension, is very common in the [place]. Hypertension is largely due to excessive salt intake, but other important risk factors include being overweight, physical inactivity, drinking too much alcohol, smoking, and not eating enough potassium from fruits and vegetables. High blood pressure is a leading risk factor for heart  attack, stroke, congestive heart failure, dementia, kidney failure, and premature death. Long-term effects of excessive salt intake include stiffening of the arteries and thickening of heart muscle and organ damage. Recommendations include ways to reduce hypertension and the risk of heart disease.  Diseases of Our Time - Focusing on Diabetes Clinical staff conducted group or individual video education with verbal and written material and guidebook.  Patient learns why the best way to stop diseases of our time is prevention, through food and other lifestyle changes. Medicine (such as prescription pills and surgeries) is often only a Band-Aid on the problem, not a long-term solution. Most common diseases of our time include obesity, type 2 diabetes, hypertension, heart disease, and cancer. The Pritikin Program is recommended and has been proven to help reduce, reverse, and/or prevent the damaging effects of metabolic syndrome.  Nutrition   Overview of the Pritikin Eating Plan  Clinical staff conducted group or individual video education with verbal and written material and guidebook.  Patient learns about the [place] for disease risk reduction. The [place] emphasizes a wide variety of unrefined, minimally-processed carbohydrates, like fruits, vegetables, whole grains, and legumes. Go, Caution, and Stop food choices are explained. Plant-based and lean animal proteins are emphasized. Rationale provided for low sodium intake for blood pressure control, low added sugars for blood sugar stabilization, and low added fats and oils for coronary artery disease risk reduction and weight management.  Calorie Density  Clinical staff conducted group or individual video education with verbal and written material and guidebook.  Patient learns about calorie density and how it impacts the Pritikin Eating Plan. Knowing the characteristics of the food you choose will help you decide whether those  foods will lead to weight gain or weight  loss, and whether you want to consume more or less of them. Weight loss is usually a side effect of the Pritikin Eating Plan because of its focus on low calorie-dense foods.  Label Reading  Clinical staff conducted group or individual video education with verbal and written material and guidebook.  Patient learns about the Pritikin recommended label reading guidelines and corresponding recommendations regarding calorie density, added sugars, sodium content, and whole grains.  Dining Out - Part 1  Clinical staff conducted group or individual video education with verbal and written material and guidebook.  Patient learns that restaurant meals can be sabotaging because they can be so high in calories, fat, sodium, and/or sugar. Patient learns recommended strategies on how to positively address this and avoid unhealthy pitfalls.  Facts on Fats  Clinical staff conducted group or individual video education with verbal and written material and guidebook.  Patient learns that lifestyle modifications can be just as effective, if not more so, as many medications for lowering your risk of heart disease. A Pritikin lifestyle can help to reduce your risk of inflammation and atherosclerosis (cholesterol build-up, or plaque, in the artery walls). Lifestyle interventions such as dietary choices and physical activity address the cause of atherosclerosis. A review of the types of fats and their impact on blood cholesterol levels, along with dietary recommendations to reduce fat intake is also included.  Nutrition Action Plan  Clinical staff conducted group or individual video education with verbal and written material and guidebook.  Patient learns how to incorporate Pritikin recommendations into their lifestyle. Recommendations include planning and keeping personal health goals in mind as an important part of their success.  Healthy Mind-Set    Healthy Minds, Bodies, Hearts   Clinical staff conducted group or individual video education with verbal and written material and guidebook.  Patient learns how to identify when they are stressed. Video will discuss the impact of that stress, as well as the many benefits of stress management. Patient will also be introduced to stress management techniques. The way we think, act, and feel has an impact on our hearts.  How Our Thoughts Can Heal Our Hearts  Clinical staff conducted group or individual video education with verbal and written material and guidebook.  Patient learns that negative thoughts can cause depression and anxiety. This can result in negative lifestyle behavior and serious health problems. Cognitive behavioral therapy is an effective method to help control our thoughts in order to change and improve our emotional outlook.  Additional Videos:  Exercise    Improving Performance  Clinical staff conducted group or individual video education with verbal and written material and guidebook.  Patient learns to use a non-linear approach by alternating intensity levels and lengths of time spent exercising to help burn more calories and lose more body fat. Cardiovascular exercise helps improve heart health, metabolism, hormonal balance, blood sugar control, and recovery from fatigue. Resistance training improves strength, endurance, balance, coordination, reaction time, metabolism, and muscle mass. Flexibility exercise improves circulation, posture, and balance. Seek guidance from your physician and exercise physiologist before implementing an exercise routine and learn your capabilities and proper form for all exercise.  Introduction to Yoga  Clinical staff conducted group or individual video education with verbal and written material and guidebook.  Patient learns about yoga, a discipline of the coming together of mind, breath, and body. The benefits of yoga include improved flexibility, improved range of motion, better  posture and core strength, increased lung function, weight loss, and positive  self-image. Yoga's heart health benefits include lowered blood pressure, healthier heart rate, decreased cholesterol and triglyceride levels, improved immune function, and reduced stress. Seek guidance from your physician and exercise physiologist before implementing an exercise routine and learn your capabilities and proper form for all exercise.  Medical   Aging: Enhancing Your Quality of Life  Clinical staff conducted group or individual video education with verbal and written material and guidebook.  Patient learns key strategies and recommendations to stay in good physical health and enhance quality of life, such as prevention strategies, having an advocate, securing a [place], and keeping a list of medications and system for tracking them. It also discusses how to avoid risk for bone loss.  Biology of Weight Control  Clinical staff conducted group or individual video education with verbal and written material and guidebook.  Patient learns that weight gain occurs because we consume more calories than we burn (eating more, moving less). Even if your body weight is normal, you may have higher ratios of fat compared to muscle mass. Too much body fat puts you at increased risk for cardiovascular disease, heart attack, stroke, type 2 diabetes, and obesity-related cancers. In addition to exercise, following the [place] can help reduce your risk.  Decoding Lab Results  Clinical staff conducted group or individual video education with verbal and written material and guidebook.  Patient learns that lab test reflects one measurement whose values change over time and are influenced by many factors, including medication, stress, sleep, exercise, food, hydration, pre-existing medical conditions, and more. It is recommended to use the knowledge from this video to become more involved with  your lab results and evaluate your numbers to speak with your doctor.   Diseases of Our Time - Overview  Clinical staff conducted group or individual video education with verbal and written material and guidebook.  Patient learns that according to the CDC, 50% to 70% of chronic diseases (such as obesity, type 2 diabetes, elevated lipids, hypertension, and heart disease) are avoidable through lifestyle improvements including healthier food choices, listening to satiety cues, and increased physical activity.  Sleep Disorders Clinical staff conducted group or individual video education with verbal and written material and guidebook.  Patient learns how good quality and duration of sleep are important to overall health and well-being. Patient also learns about sleep disorders and how they impact health along with recommendations to address them, including discussing with a physician.  Nutrition  Dining Out - Part 2 Clinical staff conducted group or individual video education with verbal and written material and guidebook.  Patient learns how to plan ahead and communicate in order to maximize their dining experience in a healthy and nutritious manner. Included are recommended food choices based on the type of restaurant the patient is visiting.   Fueling a Best boy conducted group or individual video education with verbal and written material and guidebook.  There is a strong connection between our food choices and our health. Diseases like obesity and type 2 diabetes are very prevalent and are in large-part due to lifestyle choices. The Pritikin Eating Plan provides plenty of food and hunger-curbing satisfaction. It is easy to follow, affordable, and helps reduce health risks.  Menu Workshop  Clinical staff conducted group or individual video education with verbal and written material and guidebook.  Patient learns that restaurant meals can sabotage health goals because they are  often packed with calories, fat, sodium, and sugar.  Recommendations include strategies to plan ahead and to communicate with the manager, chef, or server to help order a healthier meal.  Planning Your Eating Strategy  Clinical staff conducted group or individual video education with verbal and written material and guidebook.  Patient learns about the [place] and its benefit of reducing the risk of disease. The [place] does not focus on calories. Instead, it emphasizes high-quality, nutrient-rich foods. By knowing the characteristics of the foods, we choose, we can determine their calorie density and make informed decisions.  Targeting Your Nutrition Priorities  Clinical staff conducted group or individual video education with verbal and written material and guidebook.  Patient learns that lifestyle habits have a tremendous impact on disease risk and progression. This video provides eating and physical activity recommendations based on your personal health goals, such as reducing LDL cholesterol, losing weight, preventing or controlling type 2 diabetes, and reducing high blood pressure.  Vitamins and Minerals  Clinical staff conducted group or individual video education with verbal and written material and guidebook.  Patient learns different ways to obtain key vitamins and minerals, including through a recommended healthy diet. It is important to discuss all supplements you take with your doctor.   Healthy Mind-Set    Smoking Cessation  Clinical staff conducted group or individual video education with verbal and written material and guidebook.  Patient learns that cigarette smoking and tobacco addiction pose a serious health risk which affects millions of people. Stopping smoking will significantly reduce the risk of heart disease, lung disease, and many forms of cancer. Recommended strategies for quitting are covered, including working with your doctor to develop a  successful plan.  Culinary   Becoming a Financial trader conducted group or individual video education with verbal and written material and guidebook.  Patient learns that cooking at home can be healthy, cost-effective, quick, and puts them in control. Keys to cooking healthy recipes will include looking at your recipe, assessing your equipment needs, planning ahead, making it simple, choosing cost-effective seasonal ingredients, and limiting the use of added fats, salts, and sugars.  Cooking - Breakfast and Snacks  Clinical staff conducted group or individual video education with verbal and written material and guidebook.  Patient learns how important breakfast is to satiety and nutrition through the entire day. Recommendations include key foods to eat during breakfast to help stabilize blood sugar levels and to prevent overeating at meals later in the day. Planning ahead is also a key component.  Cooking - Human resources officer conducted group or individual video education with verbal and written material and guidebook.  Patient learns eating strategies to improve overall health, including an approach to cook more at home. Recommendations include thinking of animal protein as a side on your plate rather than center stage and focusing instead on lower calorie dense options like vegetables, fruits, whole grains, and plant-based proteins, such as beans. Making sauces in large quantities to freeze for later and leaving the skin on your vegetables are also recommended to maximize your experience.  Cooking - Healthy Salads and Dressing Clinical staff conducted group or individual video education with verbal and written material and guidebook.  Patient learns that vegetables, fruits, whole grains, and legumes are the foundations of the [place]. Recommendations include how to incorporate each of these in flavorful and healthy salads, and how to create homemade salad  dressings. Proper handling of ingredients is also covered. Cooking - Soups and  Desserts  Cooking - Soups and Desserts Clinical staff conducted group or individual video education with verbal and written material and guidebook.  Patient learns that Pritikin soups and desserts make for easy, nutritious, and delicious snacks and meal components that are low in sodium, fat, sugar, and calorie density, while high in vitamins, minerals, and filling fiber. Recommendations include simple and healthy ideas for soups and desserts.   Overview     The Pritikin Solution Program Overview Clinical staff conducted group or individual video education with verbal and written material and guidebook.  Patient learns that the results of the [place] Program have been documented in more than 100 articles published in peer-reviewed journals, and the benefits include reducing risk factors for (and, in some cases, even reversing) high cholesterol, high blood pressure, type 2 diabetes, obesity, and more! An overview of the three key pillars of the Pritikin Program will be covered: eating well, doing regular exercise, and having a healthy mind-set.  WORKSHOPS  Exercise: Exercise Basics: Building Your Action Plan Clinical staff led group instruction and group discussion with PowerPoint presentation and patient guidebook. To enhance the learning environment the use of posters, models and videos may be added. At the conclusion of this workshop, patients will comprehend the difference between physical activity and exercise, as well as the benefits of incorporating both, into their routine. Patients will understand the FITT (Frequency, Intensity, Time, and Type) principle and how to use it to build an exercise action plan. In addition, safety concerns and other considerations for exercise and cardiac rehab will be addressed by the presenter. The purpose of this lesson is to promote a comprehensive and effective weekly exercise  routine in order to improve patients' overall level of fitness.   Managing Heart Disease: Your Path to a Healthier Heart Clinical staff led group instruction and group discussion with PowerPoint presentation and patient guidebook. To enhance the learning environment the use of posters, models and videos may be added.At the conclusion of this workshop, patients will understand the anatomy and physiology of the heart. Additionally, they will understand how Pritikin's three pillars impact the risk factors, the progression, and the management of heart disease.  The purpose of this lesson is to provide a high-level overview of the heart, heart disease, and how the Pritikin lifestyle positively impacts risk factors.  Exercise Biomechanics Clinical staff led group instruction and group discussion with PowerPoint presentation and patient guidebook. To enhance the learning environment the use of posters, models and videos may be added. Patients will learn how the structural parts of their bodies function and how these functions impact their daily activities, movement, and exercise. Patients will learn how to promote a neutral spine, learn how to manage pain, and identify ways to improve their physical movement in order to promote healthy living. The purpose of this lesson is to expose patients to common physical limitations that impact physical activity. Participants will learn practical ways to adapt and manage aches and pains, and to minimize their effect on regular exercise. Patients will learn how to maintain good posture while sitting, walking, and lifting.  Balance Training and Fall Prevention  Clinical staff led group instruction and group discussion with PowerPoint presentation and patient guidebook. To enhance the learning environment the use of posters, models and videos may be added. At the conclusion of this workshop, patients will understand the importance of their sensorimotor skills  (vision, proprioception, and the vestibular system) in maintaining their ability to balance as they age. Patients will apply  a variety of balancing exercises that are appropriate for their current level of function. Patients will understand the common causes for poor balance, possible solutions to these problems, and ways to modify their physical environment in order to minimize their fall risk. The purpose of this lesson is to teach patients about the importance of maintaining balance as they age and ways to minimize their risk of falling.  WORKSHOPS   Nutrition:  Fueling a Scientist, research (physical sciences) led group instruction and group discussion with PowerPoint presentation and patient guidebook. To enhance the learning environment the use of posters, models and videos may be added. Patients will review the foundational principles of the [place] and understand what constitutes a serving size in each of the food groups. Patients will also learn Pritikin-friendly foods that are better choices when away from home and review make-ahead meal and snack options. Calorie density will be reviewed and applied to three nutrition priorities: weight maintenance, weight loss, and weight gain. The purpose of this lesson is to reinforce (in a group setting) the key concepts around what patients are recommended to eat and how to apply these guidelines when away from home by planning and selecting Pritikin-friendly options. Patients will understand how calorie density may be adjusted for different weight management goals.  Mindful Eating  Clinical staff led group instruction and group discussion with PowerPoint presentation and patient guidebook. To enhance the learning environment the use of posters, models and videos may be added. Patients will briefly review the concepts of the [place] and the importance of low-calorie dense foods. The concept of mindful eating will be introduced as well as the  importance of paying attention to internal hunger signals. Triggers for non-hunger eating and techniques for dealing with triggers will be explored. The purpose of this lesson is to provide patients with the opportunity to review the basic principles of the [place], discuss the value of eating mindfully and how to measure internal cues of hunger and fullness using the Hunger Scale. Patients will also discuss reasons for non-hunger eating and learn strategies to use for controlling emotional eating.  Targeting Your Nutrition Priorities Clinical staff led group instruction and group discussion with PowerPoint presentation and patient guidebook. To enhance the learning environment the use of posters, models and videos may be added. Patients will learn how to determine their genetic susceptibility to disease by reviewing their family history. Patients will gain insight into the importance of diet as part of an overall healthy lifestyle in mitigating the impact of genetics and other environmental insults. The purpose of this lesson is to provide patients with the opportunity to assess their personal nutrition priorities by looking at their family history, their own health history and current risk factors. Patients will also be able to discuss ways of prioritizing and modifying the [place] for their highest risk areas  Menu  Clinical staff led group instruction and group discussion with PowerPoint presentation and patient guidebook. To enhance the learning environment the use of posters, models and videos may be added. Using menus brought in from [employer], or printed from [employer], patients will apply the [place] dining out guidelines that were presented in the [employer] video. Patients will also be able to practice these guidelines in a variety of provided scenarios. The purpose of this lesson is to provide patients with the opportunity to practice  hands-on learning of the [place] with actual menus and practice  scenarios.  Label Reading Clinical staff led group instruction and group discussion with PowerPoint presentation and patient guidebook. To enhance the learning environment the use of posters, models and videos may be added. Patients will review and discuss the Pritikin label reading guidelines presented in Pritikin's Label Reading Educational series video. Using fool labels brought in from local grocery stores and markets, patients will apply the label reading guidelines and determine if the packaged food meet the Pritikin guidelines. The purpose of this lesson is to provide patients with the opportunity to review, discuss, and practice hands-on learning of the Pritikin Label Reading guidelines with actual packaged food labels. [place] Workshops are designed to teach patients ways to prepare quick, simple, and affordable recipes at home. The importance of nutrition's role in chronic disease risk reduction is reflected in its emphasis in the overall Pritikin program. By learning how to prepare essential core Pritikin Eating Plan recipes, patients will increase control over what they eat; be able to customize the flavor of foods without the use of added salt, sugar, or fat; and improve the quality of the food they consume. By learning a set of core recipes which are easily assembled, quickly prepared, and affordable, patients are more likely to prepare more healthy foods at home. These workshops focus on convenient breakfasts, simple entres, side dishes, and desserts which can be prepared with minimal effort and are consistent with nutrition recommendations for cardiovascular risk reduction. Cooking [employer] are taught by a Engineer, materials (RD) who has been trained by the [employer]. The chef or RD has a clear understanding of the importance of minimizing -  if not completely eliminating - added fat, sugar, and sodium in recipes. Throughout the series of [place] Workshop sessions, patients will learn about healthy ingredients and efficient methods of cooking to build confidence in their capability to prepare    Cooking School weekly topics:  Adding Flavor- Sodium-Free  Fast and Healthy Breakfasts  Powerhouse Plant-Based Proteins  Satisfying Salads and Dressings  Simple Sides and Sauces  International Cuisine-Spotlight on the [place] Zones  Delicious Desserts  Savory Soups  Efficiency Cooking - Meals in a Snap  Tasty Appetizers and Snacks  Comforting Weekend Breakfasts  One-Pot Wonders   Fast Evening Meals  Easy [place] (Psychosocial): New Thoughts, New Behaviors Clinical staff led group instruction and group discussion with PowerPoint presentation and patient guidebook. To enhance the learning environment the use of posters, models and videos may be added. Patients will learn and practice techniques for developing effective health and lifestyle goals. Patients will be able to effectively apply the goal setting process learned to develop at least one new personal goal.  The purpose of this lesson is to expose patients to a new skill set of behavior modification techniques such as techniques setting SMART goals, overcoming barriers, and achieving new thoughts and new behaviors.  Managing Moods and Relationships Clinical staff led group instruction and group discussion with PowerPoint presentation and patient guidebook. To enhance the learning environment the use of posters, models and videos may be added. Patients will learn how emotional and chronic stress factors can impact their health and relationships. They will learn healthy ways to manage their moods and utilize positive coping mechanisms. In addition, ICR patients will learn ways to improve communication skills.  The purpose of this lesson is to expose patients to ways of understanding how one's  mood and health are intimately connected. Developing a healthy outlook can help build positive relationships and connections with others. Patients will understand the importance of utilizing effective communication skills that include actively listening and being heard. They will learn and understand the importance of the "4 Cs" and especially Connections in fostering of a Healthy Mind-Set.  Healthy Sleep for a Healthy Heart Clinical staff led group instruction and group discussion with PowerPoint presentation and patient guidebook. To enhance the learning environment the use of posters, models and videos may be added. At the conclusion of this workshop, patients will be able to demonstrate knowledge of the importance of sleep to overall health, well-being, and quality of life. They will understand the symptoms of, and treatments for, common sleep disorders. Patients will also be able to identify daytime and nighttime behaviors which impact sleep, and they will be able to apply these tools to help manage sleep-related challenges. The purpose of this lesson is to provide patients with a general overview of sleep and outline the importance of quality sleep. Patients will learn about a few of the most common sleep disorders. Patients will also be introduced to the concept of "sleep hygiene," and discover ways to self-manage certain sleeping problems through simple daily behavior changes. Finally, the workshop will motivate patients by clarifying the links between quality sleep and their goals of heart-healthy living.   Recognizing and Reducing Stress Clinical staff led group instruction and group discussion with PowerPoint presentation and patient guidebook. To enhance the learning environment the use of posters, models and videos may be added. At the conclusion of this workshop, patients will be able to understand the types of  stress reactions, differentiate between acute and chronic stress, and recognize the impact that chronic stress has on their health. They will also be able to apply different coping mechanisms, such as reframing negative self-talk. Patients will have the opportunity to practice a variety of stress management techniques, such as deep abdominal breathing, progressive muscle relaxation, and/or guided imagery.  The purpose of this lesson is to educate patients on the role of stress in their lives and to provide healthy techniques for coping with it.  Learning Barriers/Preferences:  Learning Barriers/Preferences - 05/14/22 0944       Learning Barriers/Preferences   Learning Barriers Sight;Hearing   wears glasses/ hearing aids   Learning Preferences Computer/Internet;Pictoral;Written Material             Education Topics:  Knowledge Questionnaire Score:  Knowledge Questionnaire Score - 05/14/22 0945       Knowledge Questionnaire Score   Pre Score 24/24             Core Components/Risk Factors/Patient Goals at Admission:  Personal Goals and Risk Factors at Admission - 05/14/22 0946       Core Components/Risk Factors/Patient Goals on Admission    Weight Management Weight Maintenance;Yes    Intervention Weight Management: Develop a combined nutrition and exercise program designed to reach desired caloric intake, while maintaining appropriate intake of nutrient and fiber, sodium and fats, and appropriate energy expenditure required for the weight goal.;Weight Management: Provide education and appropriate resources to help participant work on and attain dietary goals.    Expected Outcomes Short Term: Continue to assess and modify interventions until short term weight is achieved;Long Term: Adherence to nutrition and physical activity/exercise program aimed toward attainment of established weight goal;Weight Maintenance: Understanding of the daily nutrition guidelines, which includes 25-35%  calories from fat, 7% or less cal from saturated  fats, less than '200mg'$  cholesterol, less than 1.5gm of sodium, & 5 or more servings of fruits and vegetables daily;Understanding recommendations for meals to include 15-35% energy as protein, 25-35% energy from fat, 35-60% energy from carbohydrates, less than '200mg'$  of dietary cholesterol, 20-35 gm of total fiber daily;Understanding of distribution of calorie intake throughout the day with the consumption of 4-5 meals/snacks    Diabetes Yes    Intervention Provide education about signs/symptoms and action to take for hypo/hyperglycemia.;Provide education about proper nutrition, including hydration, and aerobic/resistive exercise prescription along with prescribed medications to achieve blood glucose in normal ranges: Fasting glucose 65-99 mg/dL    Expected Outcomes Short Term: Participant verbalizes understanding of the signs/symptoms and immediate care of hyper/hypoglycemia, proper foot care and importance of medication, aerobic/resistive exercise and nutrition plan for blood glucose control.;Long Term: Attainment of HbA1C < 7%.    Lipids Yes    Intervention Provide education and support for participant on nutrition & aerobic/resistive exercise along with prescribed medications to achieve LDL '70mg'$ , HDL >'40mg'$ .    Expected Outcomes Short Term: Participant states understanding of desired cholesterol values and is compliant with medications prescribed. Participant is following exercise prescription and nutrition guidelines.;Long Term: Cholesterol controlled with medications as prescribed, with individualized exercise RX and with personalized nutrition plan. Value goals: LDL < '70mg'$ , HDL > 40 mg.             Core Components/Risk Factors/Patient Goals Review:    Core Components/Risk Factors/Patient Goals at Discharge (Final Review):    ITP Comments:  ITP Comments     Row Name 05/14/22 0811           ITP Comments Dr. Fransico Him medical director.  Introduction to pritikin education program/ intensive cardiac rehab. Initial orientation packet reviewed with patient.                Comments: Participant attended orientation for the cardiac rehabilitation program on  05/14/2022  to perform initial intake and exercise walk test. Patient introduced to the [place] education and orientation packet was reviewed. Completed 6-minute walk test, measurements, initial ITP, and exercise prescription. Vital signs stable. Telemetry-normal sinus rhythm, asymptomatic.   Service time was from 0753 to [phone].  [name], MS 05/14/2022 1:28 PM

## 2022-05-19 ENCOUNTER — Encounter (HOSPITAL_COMMUNITY)
Admission: RE | Admit: 2022-05-19 | Discharge: 2022-05-19 | Disposition: A | Discharge status: Home / Self Care | Payer: Medicare Other | Source: Ambulatory Visit | Attending: Cardiology | Admitting: Cardiology

## 2022-05-19 DIAGNOSIS — Z955 Presence of coronary angioplasty implant and graft: Secondary | ICD-10-CM | POA: Insufficient documentation

## 2022-05-19 LAB — GLUCOSE, CAPILLARY
Glucose-Capillary: 136 mg/dL — ABNORMAL HIGH (ref 70–99)
Glucose-Capillary: 86 mg/dL (ref 70–99)

## 2022-05-19 NOTE — Progress Notes (Signed)
Daily Session Note  Patient Details  Name: Steven Lawrence MRN: [id number] Date of Birth: 1951/02/26 Referring Provider:   Flowsheet Row INTENSIVE CARDIAC REHAB ORIENT from 05/14/2022 in [facility]  Referring Provider [name], MD       Encounter Date: 05/19/2022  Check In:  Session Check In - 05/19/22 1056       Check-In   Supervising physician immediately available to respond to emergencies [facility] MD immediately available    Physician(s) Coletta Memos, PA    Staff Present Janine Ores, RN, Milus Glazier, MS, ACSM-CEP, CCRP, Exercise Physiologist;Olinty Celesta Aver, MS, ACSM-CEP, Exercise Physiologist;Jetta Gilford Rile BS, ACSM-CEP, Exercise Physiologist;Johnny Starleen Blue, MS, Exercise Physiologist;Kaylee Rosana Hoes, MS, ACSM-CEP, Exercise Physiologist;Camara Renstrom, RN, BSN    Virtual Visit No    Medication changes reported     No    Fall or balance concerns reported    No    Tobacco Cessation No Change    Warm-up and Cool-down Performed as group-led instruction    Resistance Training Performed Yes    VAD Patient? No    PAD/SET Patient? No      Pain Assessment   Currently in Pain? No/denies    Pain Score 0-No pain    Multiple Pain Sites No             Capillary Blood Glucose: Results for orders placed or performed during the hospital encounter of 05/14/22 (from the past 24 hour(s))  Glucose, capillary     Status: Abnormal   Collection Time: 05/19/22 10:27 AM  Result Value Ref Range   Glucose-Capillary 136 (H) 70 - 99 mg/dL  Glucose, capillary     Status: None   Collection Time: 05/19/22 11:24 AM  Result Value Ref Range   Glucose-Capillary 86 70 - 99 mg/dL     Exercise Prescription Changes - 05/19/22 1026       Response to Exercise   Blood Pressure (Admit) 102/58    Blood Pressure (Exercise) 160/72    Blood Pressure (Exit) 100/62    Heart Rate (Admit) 76 bpm    Heart Rate (Exercise) 116 bpm    Heart Rate  (Exit) 81 bpm    Rating of Perceived Exertion (Exercise) 13    Symptoms None    Comments Off to a good start with exercise.    Duration Continue with 30 min of aerobic exercise without signs/symptoms of physical distress.    Intensity THRR unchanged      Progression   Progression Continue to progress workloads to maintain intensity without signs/symptoms of physical distress.    Average METs 2.6      Resistance Training   Training Prescription Yes    Weight 3    Reps 10-15    Time 10 Minutes      Interval Training   Interval Training No      Treadmill   MPH 2.5    Grade 0    Minutes 15    METs 2.91      Arm Ergometer   Level 2    Watts 15    RPM 49    Minutes 15    METs 2.3             Social History   Tobacco Use  Smoking Status Former   Packs/day: 1.00   Years: 53.00   Total pack years: 53.00   Types: Cigarettes   Quit date: 12/16/2019   Years since quitting: 2.4  Smokeless Tobacco Never    Goals Met:  Exercise tolerated well No report of concerns or symptoms today Strength training completed today  Goals Unmet:  Not Applicable  Comments: Pt started cardiac rehab today.  Pt tolerated light exercise without difficulty. VSS, telemetry-Sinus Rhythm, asymptomatic.  Medication list reconciled. Pt denies barriers to medicaiton compliance.  PSYCHOSOCIAL ASSESSMENT:  PHQ-2. Pt exhibits positive coping skills, hopeful outlook with supportive family. No psychosocial needs identified at this time, no psychosocial interventions necessary.    Pt enjoys fishing, walking, travelling, hiking, sail boating and reading.   Pt oriented to exercise equipment and routine.    Understanding verbalized. Post exercise CBg 86. Patient was given orange juice. Asymptomatic.Harrell Gave RN BSN    Dr. Fransico Him is Medical Director for Cardiac Rehab at [facility].

## 2022-05-21 ENCOUNTER — Encounter (HOSPITAL_COMMUNITY): Payer: Medicare Other

## 2022-05-23 ENCOUNTER — Encounter (HOSPITAL_COMMUNITY): Payer: Medicare Other

## 2022-05-26 ENCOUNTER — Encounter (HOSPITAL_COMMUNITY)
Admission: RE | Admit: 2022-05-26 | Discharge: 2022-05-26 | Disposition: A | Discharge status: Home / Self Care | Payer: Medicare Other | Source: Ambulatory Visit | Attending: Cardiology | Admitting: Cardiology

## 2022-05-26 DIAGNOSIS — Z955 Presence of coronary angioplasty implant and graft: Secondary | ICD-10-CM | POA: Diagnosis not present

## 2022-05-28 ENCOUNTER — Encounter (HOSPITAL_COMMUNITY)
Admission: RE | Admit: 2022-05-28 | Discharge: 2022-05-28 | Disposition: A | Discharge status: Home / Self Care | Payer: Medicare Other | Source: Ambulatory Visit | Attending: Cardiology | Admitting: Cardiology

## 2022-05-28 DIAGNOSIS — Z955 Presence of coronary angioplasty implant and graft: Secondary | ICD-10-CM | POA: Diagnosis not present

## 2022-05-29 NOTE — Telephone Encounter (Signed)
I called the pt back who was confused as to why he could have his carpal tunnel surgery. I explained to the pt due to recent cardiac stents placed 03/2022, it is high advised to not interrupt blood thinners or platelet therapy x 1 yr to lessen the risk of restenosis. Pt states he was told by the surgeon office to begin holding his Plavix as of last Tuesday 05/20/22. I advised the pt to resume his Plavix as of today. Pt is grateful that I called him back and that I took the time to explain this to him. Pt is concerned now if his stents may have blocked up while he has been off Plavix since 05/20/22. I informed the pt that only way we would know if stents re-stenosed would be with another heart cath. I advised again to resume Plavix as of today. Pt again thanked me for the call and the help.   I assured the pt that I will update the surgeon's office of our conversation today.

## 2022-05-29 NOTE — Telephone Encounter (Signed)
Pt called in confused on why he is unable to have this surgery and would like clarification on this. He also stated he was told to stop his plavix and wants to know if he should start back on it.

## 2022-05-30 ENCOUNTER — Encounter (HOSPITAL_COMMUNITY)
Admission: RE | Admit: 2022-05-30 | Discharge: 2022-05-30 | Disposition: A | Discharge status: Home / Self Care | Payer: Medicare Other | Source: Ambulatory Visit | Attending: Cardiology | Admitting: Cardiology

## 2022-05-30 DIAGNOSIS — Z955 Presence of coronary angioplasty implant and graft: Secondary | ICD-10-CM | POA: Diagnosis not present

## 2022-06-02 ENCOUNTER — Encounter (HOSPITAL_COMMUNITY)
Admission: RE | Admit: 2022-06-02 | Discharge: 2022-06-02 | Disposition: A | Discharge status: Home / Self Care | Payer: Medicare Other | Source: Ambulatory Visit | Attending: Cardiology | Admitting: Cardiology

## 2022-06-02 DIAGNOSIS — Z955 Presence of coronary angioplasty implant and graft: Secondary | ICD-10-CM

## 2022-06-04 ENCOUNTER — Encounter (HOSPITAL_COMMUNITY)
Admission: RE | Admit: 2022-06-04 | Discharge: 2022-06-04 | Disposition: A | Discharge status: Home / Self Care | Payer: Medicare Other | Source: Ambulatory Visit | Attending: Cardiology | Admitting: Cardiology

## 2022-06-04 DIAGNOSIS — Z955 Presence of coronary angioplasty implant and graft: Secondary | ICD-10-CM | POA: Diagnosis not present

## 2022-06-04 NOTE — Progress Notes (Signed)
Cardiac Individual Treatment Plan  Patient Details  Name: Steven Lawrence MRN: [id number] Date of Birth: [birth year] Referring Provider:   [place] from 05/14/2022 in [facility] for Heart, Vascular, & [place]  Referring Provider [name], MD       Initial Encounter Date:  [place] from 05/14/2022 in [facility] for Heart, Vascular, & Lung Health  Date 05/14/22       Visit Diagnosis: Status post coronary artery stent placement  Patient's Home Medications on Admission:  Current Outpatient Medications:    acetaminophen (TYLENOL) 500 MG tablet, Take 1,000 mg by mouth every 6 (six) hours as needed (Joint pain)., Disp: , Rfl:    aspirin EC 81 MG EC tablet, Take 1 tablet (81 mg total) by mouth daily. Swallow whole., Disp: 30 tablet, Rfl: 11   atorvastatin (LIPITOR) 80 MG tablet, Take 80 mg by mouth daily. , Disp: , Rfl:    canagliflozin (INVOKANA) 300 MG TABS tablet, Take 300 mg by mouth daily., Disp: , Rfl:    cholestyramine light 4 g POWD, Take by mouth daily. 1 Scoop, Disp: , Rfl:    clopidogrel (PLAVIX) 75 MG tablet, Take 1 tablet (75 mg total) by mouth daily., Disp: 30 tablet, Rfl: 11   doxazosin (CARDURA) 2 MG tablet, Take 2 mg by mouth every morning. Takes for urinary freq, Disp: , Rfl:    insulin degludec (TRESIBA) 100 UNIT/ML FlexTouch Pen, Inject 20 Units into the skin at bedtime., Disp: , Rfl:    isosorbide mononitrate (IMDUR) 30 MG 24 hr tablet, Take 1 tablet (30 mg total) by mouth daily., Disp: 90 tablet, Rfl: 3   Levothyroxine Sodium 112 MCG CAPS, Take 112 mcg by mouth daily before breakfast., Disp: , Rfl:    lisinopril (ZESTRIL) 5 MG tablet, Take 5 mg by mouth daily., Disp: , Rfl:    metFORMIN (GLUCOPHAGE) 1000 MG tablet, Take 1,000 mg by mouth 2 (two) times daily with a meal., Disp: , Rfl:    nitroGLYCERIN (NITROSTAT) 0.4 MG SL tablet, Place 1 tablet  (0.4 mg total) under the tongue every 5 (five) minutes as needed for chest pain., Disp: 25 tablet, Rfl: 3   pramipexole (MIRAPEX) 0.125 MG tablet, Take 0.5 mg by mouth at bedtime., Disp: , Rfl:   Past Medical History: Past Medical History:  Diagnosis Date   Acid reflux    Arthritis    BPH (benign prostatic hyperplasia)    mild   CAD in native artery    a. NSTEMI 12/2019 s/p CABGx5 with bovine AVR.   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   History of pneumonia as a child    Hypercholesteremia    Hypothyroidism    RLS (restless legs syndrome)    S/P aortic valve replacement with bioprosthetic valve    S/P CABG (coronary artery bypass graft)    Sleep apnea    does not use a cpap,primarily with upper airway resistance syndrome AHI 2.35/hr, RDI 10.2/hr ([name])   Urinary frequency    Wears glasses    Wears hearing aid    both ears    Tobacco Use: Social History   Tobacco Use  Smoking Status Former   Packs/day: 1.00   Years: 53.00   Additional pack years: 0.00   Total pack years: 53.00   Types: Cigarettes   Quit date: 12/16/2019   Years since  quitting: 2.4  Smokeless Tobacco Never    Labs: Review Flowsheet  More data exists      Latest Ref Rng & Units 12/17/2019 12/20/2019 12/21/2019 04/10/2020 09/05/2020  Labs for ITP Cardiac and Pulmonary Rehab  Cholestrol 100 - 199 mg/dL - 124  - 135  115   LDL (calc) 0 - 99 mg/dL - 66  - 75  48   HDL-C >39 mg/dL - 49  - 47  55   Trlycerides 0 - 149 mg/dL - 47  - 63  48   Hemoglobin A1c 4.8 - 5.6 % 7.1  - - - -  PH, Arterial 7.350 - 7.450 - - 7.357  7.404  7.377  7.438  7.437  7.338  7.347  7.411  - -  PCO2 arterial 32.0 - 48.0 mmHg - - 37.2  33.2  37.9  37.1  37.3  47.3  47.8  37.3  - -  Bicarbonate 20.0 - 28.0 mmol/L - - 20.8  20.8  22.6  25.1  25.1  25.4  26.2  23.2  - -  TCO2 22 - 32 mmol/L - - 22  22  24  23  26  26  26  27  27  28  24  25   - -  Acid-base deficit 0.0 - 2.0 mmol/L - - 4.0   3.0  3.0  1.0  0.8  - -  O2 Saturation % - - 97.0  99.0  99.0  100.0  100.0  89.0  100.0  97.9  - -    Capillary Blood Glucose: Lab Results  Component Value Date   GLUCAP 86 05/19/2022   GLUCAP 136 (H) 05/19/2022   GLUCAP 165 (H) 05/14/2022   GLUCAP 138 (H) 04/11/2022   GLUCAP 113 (H) 03/21/2020     Exercise Target Goals: Exercise Program Goal: Individual exercise prescription set using results from initial 6 min walk test and THRR while considering  patient's activity barriers and safety.   Exercise Prescription Goal: Initial exercise prescription builds to 30-45 minutes a day of aerobic activity, 2-3 days per week.  Home exercise guidelines will be given to patient during program as part of exercise prescription that the participant will acknowledge.  Activity Barriers & Risk Stratification:  Activity Barriers & Cardiac Risk Stratification - 05/14/22 0951       Activity Barriers & Cardiac Risk Stratification   Activity Barriers Arthritis;Back Problems;Joint Problems;Muscular Weakness    Cardiac Risk Stratification High   < 5 METs on 6MWT            6 Minute Walk:  6 Minute Walk     Row Name 05/14/22 0949         6 Minute Walk   Phase Initial     Distance 1829 feet     Walk Time 6 minutes     # of Rest Breaks 0     MPH 3.46     METS 4.39     RPE 12     Perceived Dyspnea  0     VO2 Peak 15.35     Symptoms Yes (comment)     Comments a little winded, "feels like i worked hard"     Resting HR 75 bpm     Resting BP 102/56     Resting Oxygen Saturation  95 %     Exercise Oxygen Saturation  during 6 min walk 95 %     Max Ex. HR 125 bpm  advised to  slow down to target     Max Ex. BP 144/62     2 Minute Post BP 120/64              Oxygen Initial Assessment:   Oxygen Re-Evaluation:   Oxygen Discharge (Final Oxygen Re-Evaluation):   Initial Exercise Prescription:  Initial Exercise Prescription - 05/14/22 0900       Date of Initial Exercise RX  and Referring Provider   Date 05/14/22    Referring Provider [name], MD    Expected Discharge Date 07/25/22      Treadmill   MPH 2.5    Grade 0    Minutes 15    METs 3.5      Arm Ergometer   Level 2    RPM 40    Minutes 15    METs 3.5      Prescription Details   Frequency (times per week) 3    Duration Progress to 30 minutes of continuous aerobic without signs/symptoms of physical distress      Intensity   THRR 40-80% of Max Heartrate 59-119    Ratings of Perceived Exertion 11-13    Perceived Dyspnea 0-4      Progression   Progression Continue progressive overload as per policy without signs/symptoms or physical distress.      Resistance Training   Training Prescription Yes    Weight 3    Reps 10-15             Perform Capillary Blood Glucose checks as needed.  Exercise Prescription Changes:   Exercise Prescription Changes     Row Name 05/19/22 1026 05/28/22 1029           Response to Exercise   Blood Pressure (Admit) 102/58 106/58      Blood Pressure (Exercise) 160/72 140/70      Blood Pressure (Exit) 100/62 94/60      Heart Rate (Admit) 76 bpm 80 bpm      Heart Rate (Exercise) 116 bpm 126 bpm      Heart Rate (Exit) 81 bpm 94 bpm      Rating of Perceived Exertion (Exercise) 13 13      Symptoms None None      Comments Off to a good start with exercise. --      Duration Continue with 30 min of aerobic exercise without signs/symptoms of physical distress. Continue with 30 min of aerobic exercise without signs/symptoms of physical distress.      Intensity THRR unchanged THRR unchanged        Progression   Progression Continue to progress workloads to maintain intensity without signs/symptoms of physical distress. Continue to progress workloads to maintain intensity without signs/symptoms of physical distress.      Average METs 2.6 2.8        Resistance Training   Training Prescription Yes No  Relaxation day, no weights      Weight 3 --       Reps 10-15 --      Time 10 Minutes --        Interval Training   Interval Training No No        Treadmill   MPH 2.5 2.5      Grade 0 0      Minutes 15 15      METs 2.91 2.91        Arm Ergometer   Level 2 2      Watts 15 16  RPM 49 54      Minutes 15 15      METs 2.3 2.8               Exercise Comments:   Exercise Comments     Row Name 05/19/22 1127 05/30/22 1044         Exercise Comments Patient tolerated first session of exercise well without symptoms. Reviewed METs and goals with patient.               Exercise Goals and Review:   Exercise Goals     Row Name 05/14/22 0955             Exercise Goals   Increase Physical Activity Yes       Intervention Provide advice, education, support and counseling about physical activity/exercise needs.;Develop an individualized exercise prescription for aerobic and resistive training based on initial evaluation findings, risk stratification, comorbidities and participant's personal goals.       Expected Outcomes Short Term: Attend rehab on a regular basis to increase amount of physical activity.;Long Term: Exercising regularly at least 3-5 days a week.;Long Term: Add in home exercise to make exercise part of routine and to increase amount of physical activity.       Increase Strength and Stamina Yes       Intervention Provide advice, education, support and counseling about physical activity/exercise needs.;Develop an individualized exercise prescription for aerobic and resistive training based on initial evaluation findings, risk stratification, comorbidities and participant's personal goals.       Expected Outcomes Short Term: Increase workloads from initial exercise prescription for resistance, speed, and METs.;Short Term: Perform resistance training exercises routinely during rehab and add in resistance training at home;Long Term: Improve cardiorespiratory fitness, muscular endurance and strength as measured by  increased METs and functional capacity (6MWT)       Able to understand and use rate of perceived exertion (RPE) scale Yes       Intervention Provide education and explanation on how to use RPE scale       Expected Outcomes Short Term: Able to use RPE daily in rehab to express subjective intensity level;Long Term:  Able to use RPE to guide intensity level when exercising independently       Knowledge and understanding of Target Heart Rate Range (THRR) Yes       Intervention Provide education and explanation of THRR including how the numbers were predicted and where they are located for reference       Expected Outcomes Short Term: Able to state/look up THRR;Long Term: Able to use THRR to govern intensity when exercising independently;Short Term: Able to use daily as guideline for intensity in rehab       Understanding of Exercise Prescription Yes       Intervention Provide education, explanation, and written materials on patient's individual exercise prescription       Expected Outcomes Short Term: Able to explain program exercise prescription;Long Term: Able to explain home exercise prescription to exercise independently                Exercise Goals Re-Evaluation :  Exercise Goals Re-Evaluation     Row Name 05/19/22 1127 05/30/22 1044           Exercise Goal Re-Evaluation   Exercise Goals Review Increase Physical Activity;Able to understand and use rate of perceived exertion (RPE) scale Increase Physical Activity;Able to understand and use rate of perceived exertion (RPE) scale  Comments Patient able to understand and use RPE scale appropriately. Patient feels he's doing well with exercise. His goal is to improve his nutritional habits. Patient may be having carpal tunnel surgery next Tuesday will modify equipment to avoid hand grips.      Expected Outcomes Progress workloads as tolerated to help increase strength and stamina. Patient will attend the Nutrition classes and meet with  the dietitan to discuss healthy eat plan.               Discharge Exercise Prescription (Final Exercise Prescription Changes):  Exercise Prescription Changes - 05/28/22 1029       Response to Exercise   Blood Pressure (Admit) 106/58    Blood Pressure (Exercise) 140/70    Blood Pressure (Exit) 94/60    Heart Rate (Admit) 80 bpm    Heart Rate (Exercise) 126 bpm    Heart Rate (Exit) 94 bpm    Rating of Perceived Exertion (Exercise) 13    Symptoms None    Duration Continue with 30 min of aerobic exercise without signs/symptoms of physical distress.    Intensity THRR unchanged      Progression   Progression Continue to progress workloads to maintain intensity without signs/symptoms of physical distress.    Average METs 2.8      Resistance Training   Training Prescription No   Relaxation day, no weights     Interval Training   Interval Training No      Treadmill   MPH 2.5    Grade 0    Minutes 15    METs 2.91      Arm Ergometer   Level 2    Watts 16    RPM 54    Minutes 15    METs 2.8             Nutrition:  Target Goals: Understanding of nutrition guidelines, daily intake of sodium 1500mg , cholesterol 200mg , calories 30% from fat and 7% or less from saturated fats, daily to have 5 or more servings of fruits and vegetables.  Biometrics:  Pre Biometrics - 05/14/22 0940       Pre Biometrics   Waist Circumference 37.5 inches    Hip Circumference 39 inches    Waist to Hip Ratio 0.96 %    Triceps Skinfold 20 mm    % Body Fat 26.2 %    Grip Strength 34 kg    Flexibility 15.25 in    Single Leg Stand 6.12 seconds              Nutrition Therapy Plan and Nutrition Goals:  Nutrition Therapy & Goals - 05/19/22 1120       Nutrition Therapy   Diet Heart Healthy/Carbohydrate Consistent Diet    Drug/Food Interactions Statins/Certain Fruits      Personal Nutrition Goals   Nutrition Goal Patient to identify strategies for reducing cardiovascular risk by  attending the weekly Pritikin education and nutrition series    Personal Goal #2 Patient to improve diet quality by using the plate method as a daily guide for meal planning to include lean protein/plant protein, fruits, vegetables, whole grains, nonfat dairy as part of well balanced diet    Personal Goal #3 Patient to reduce sodium to 1500mg  per day    Personal Goal #4 Patient to identify strategies for blood sugar control with goal A1c <7%    Comments Goals in action. Steven Lawrence reports making many lifestyle changes since his most recent cardiac event including reduced saturated  fat intake, switched to whole grains, and is reading food labels for sodium. He has previously completed cardiac rehab in the past. He does report fasting/am blood sugars have improved >30points. Steven Lawrence will continue to benefit from participation in intensive cardiac rehab for nutrition, exercise, and lifestyle modificaton.      Intervention Plan   Intervention Prescribe, educate and counsel regarding individualized specific dietary modifications aiming towards targeted core components such as weight, hypertension, lipid management, diabetes, heart failure and other comorbidities.;Nutrition handout(s) given to patient.    Expected Outcomes Short Term Goal: Understand basic principles of dietary content, such as calories, fat, sodium, cholesterol and nutrients.;Long Term Goal: Adherence to prescribed nutrition plan.             Nutrition Assessments:  Nutrition Assessments - 05/19/22 1235       Rate Your Plate Scores   Pre Score 53            MEDIFICTS Score Key: ?70 Need to make dietary changes  40-70 Heart Healthy Diet ? 40 Therapeutic Level Cholesterol Diet   Flowsheet Row INTENSIVE CARDIAC REHAB from 05/19/2022 in [facility] for Heart, Vascular, & Lung Health  Picture Your Plate Total Score on Admission 53      Picture Your Plate Scores: [id number] Unhealthy dietary pattern with much room  for improvement. 41-50 Dietary pattern unlikely to meet recommendations for good health and room for improvement. 51-60 More healthful dietary pattern, with some room for improvement.  >60 Healthy dietary pattern, although there may be some specific behaviors that could be improved.    Nutrition Goals Re-Evaluation:  Nutrition Goals Re-Evaluation     [place] Name 05/19/22 1120             Goals   Current Weight 163 lb 9.3 oz (74.2 kg)       Comment lipids WNL, A1c 7.7       Expected Outcome Goals in action. Steven Lawrence reports making many lifestyle changes since his most recent cardiac event including reduced saturated fat intake, switched to whole grains, and is reading food labels for sodium. He has previously completed cardiac rehab in the past. He does report fasting/am blood sugars have improved >30points. [place] will continue to benefit from participation in intensive cardiac rehab for nutrition, exercise, and lifestyle modificaton.                Nutrition Goals Re-Evaluation:  Nutrition Goals Re-Evaluation     [place] Name 05/19/22 1120             Goals   Current Weight 163 lb 9.3 oz (74.2 kg)       Comment lipids WNL, A1c 7.7       Expected Outcome Goals in action. Steven Lawrence reports making many lifestyle changes since his most recent cardiac event including reduced saturated fat intake, switched to whole grains, and is reading food labels for sodium. He has previously completed cardiac rehab in the past. He does report fasting/am blood sugars have improved >30points. [place] will continue to benefit from participation in intensive cardiac rehab for nutrition, exercise, and lifestyle modificaton.                Nutrition Goals Discharge (Final Nutrition Goals Re-Evaluation):  Nutrition Goals Re-Evaluation - 05/19/22 1120       Goals   Current Weight 163 lb 9.3 oz (74.2 kg)    Comment lipids WNL, A1c 7.7    Expected Outcome Goals in action. Steven Lawrence reports  making many lifestyle  changes since his most recent cardiac event including reduced saturated fat intake, switched to whole grains, and is reading food labels for sodium. He has previously completed cardiac rehab in the past. He does report fasting/am blood sugars have improved >30points. Steven Lawrence will continue to benefit from participation in intensive cardiac rehab for nutrition, exercise, and lifestyle modificaton.             Psychosocial: Target Goals: Acknowledge presence or absence of significant depression and/or stress, maximize coping skills, provide positive support system. Participant is able to verbalize types and ability to use techniques and skills needed for reducing stress and depression.  Initial Review & Psychosocial Screening:  Initial Psych Review & Screening - 05/14/22 0942       Initial Review   Current issues with None Identified      Family Dynamics   Good Support System? Yes   Steven Lawrence has his wife for support   Comments Steven Lawrence presents to cardiac rehab with no feelings of depression, stress, or anxiety. He is excited to start the new intensive cardiac rehab education.      Barriers   Psychosocial barriers to participate in program There are no identifiable barriers or psychosocial needs.      Screening Interventions   Interventions Encouraged to exercise;To provide support and resources with identified psychosocial needs;Provide feedback about the scores to participant    Expected Outcomes Short Term goal: Identification and review with participant of any Quality of Life or Depression concerns found by scoring the questionnaire.;Long Term goal: The participant improves quality of Life and PHQ9 Scores as seen by post scores and/or verbalization of changes             Quality of Life Scores:  Quality of Life - 05/14/22 0957       Quality of Life   Select Quality of Life      Quality of Life Scores   Health/Function Pre 25.7 %    Socioeconomic Pre 28.07 %    Psych/Spiritual Pre  27.93 %    Family Pre 28.8 %    GLOBAL Pre 27.1 %            Scores of 19 and below usually indicate a poorer quality of life in these areas.  A difference of  2-3 points is a clinically meaningful difference.  A difference of 2-3 points in the total score of the Quality of Life Index has been associated with significant improvement in overall quality of life, self-image, physical symptoms, and general health in studies assessing change in quality of life.  PHQ-9: Review Flowsheet       05/14/2022 05/10/2020 03/13/2020  Depression screen PHQ 2/9  Decreased Interest 0 0 0  Down, Depressed, Hopeless 1 0 0  PHQ - 2 Score 1 0 0  Altered sleeping 0 - -  Tired, decreased energy 1 - -  Change in appetite 0 - -  Feeling bad or failure about yourself  0 - -  Trouble concentrating 0 - -  Moving slowly or fidgety/restless 0 - -  Suicidal thoughts 0 - -  PHQ-9 Score 2 - -  Difficult doing work/chores Not difficult at all - -   Interpretation of Total Score  Total Score Depression Severity:  1-4 = Minimal depression, 5-9 = Mild depression, 10-14 = Moderate depression, 15-19 = Moderately severe depression, 20-27 = Severe depression   Psychosocial Evaluation and Intervention:   Psychosocial Re-Evaluation:  Psychosocial Re-Evaluation  [place] Name 05/20/22 0741 06/03/22 1631           Psychosocial Re-Evaluation   Current issues with None Identified None Identified      Interventions Encouraged to attend Cardiac Rehabilitation for the exercise Encouraged to attend Cardiac Rehabilitation for the exercise      Continue Psychosocial Services  No Follow up required No Follow up required               Psychosocial Discharge (Final Psychosocial Re-Evaluation):  Psychosocial Re-Evaluation - 06/03/22 1631       Psychosocial Re-Evaluation   Current issues with None Identified    Interventions Encouraged to attend Cardiac Rehabilitation for the exercise    Continue Psychosocial  Services  No Follow up required             Vocational Rehabilitation: Provide vocational rehab assistance to qualifying candidates.   Vocational Rehab Evaluation & Intervention:  Vocational Rehab - 05/14/22 0946       Initial Vocational Rehab Evaluation & Intervention   Assessment shows need for Vocational Rehabilitation No   Steven Lawrence is retired            Education: Education Goals: Education classes will be provided on a weekly basis, covering required topics. Participant will state understanding/return demonstration of topics presented.    Education     Row Name 05/19/22 1600     Education   Cardiac Education Topics Pritikin   Environmental consultant Psychosocial   Psychosocial Workshop Other  From [employer] to Heart   Instruction Review Code 1- Verbalizes Understanding   Class Start Time 1145   Class Stop Time 1231   Class Time Calculation (min) 46 min    [place] Name 05/26/22 1400     Education   Cardiac Education Topics Pritikin   Select Workshops     Workshops   Educator Exercise Physiologist   Select Exercise   Exercise Workshop Hotel manager and Fall Prevention   Instruction Review Code 1- Verbalizes Understanding   Class Start Time 1145   Class Stop Time 1230   Class Time Calculation (min) 45 min    [place] Name 05/28/22 1300     Education   Cardiac Education Topics Pritikin   Financial trader   Weekly Topic Fast and Healthy Breakfasts   Instruction Review Code 1- Verbalizes Understanding   Class Start Time 1145   Class Stop Time 1226   Class Time Calculation (min) 41 min    [place] Name 05/30/22 1200     Education   Cardiac Education Topics Pritikin   Lexicographer Nutrition   Nutrition Fueling a Healthy Body   Instruction Review Code 1- Verbalizes Understanding   Class Start Time 1150   Class  Stop Time 1230   Class Time Calculation (min) 40 min    [place] Name 06/02/22 1700     Education   Cardiac Education Topics Pritikin   [employer]     Workshops   Educator Exercise Physiologist   Select Psychosocial   Psychosocial Workshop Recognizing and Reducing Stress   Instruction Review Code 1- Verbalizes Understanding   Class Start Time 1148   Class Stop Time 1233   Class Time Calculation (min) 45 min  Core Videos: Exercise    Move It!  Clinical staff conducted group or individual video education with verbal and written material and guidebook.  Patient learns the recommended Pritikin exercise program. Exercise with the goal of living a long, healthy life. Some of the health benefits of exercise include controlled diabetes, healthier blood pressure levels, improved cholesterol levels, improved heart and lung capacity, improved sleep, and better body composition. Everyone should speak with their doctor before starting or changing an exercise routine.  Biomechanical Limitations Clinical staff conducted group or individual video education with verbal and written material and guidebook.  Patient learns how biomechanical limitations can impact exercise and how we can mitigate and possibly overcome limitations to have an impactful and balanced exercise routine.  Body Composition Clinical staff conducted group or individual video education with verbal and written material and guidebook.  Patient learns that body composition (ratio of muscle mass to fat mass) is a key component to assessing overall fitness, rather than body weight alone. Increased fat mass, especially visceral belly fat, can put [place] at increased risk for metabolic syndrome, type 2 diabetes, heart disease, and even death. It is recommended to combine diet and exercise (cardiovascular and resistance training) to improve your body composition. Seek guidance from your physician and exercise physiologist before  implementing an exercise routine.  Exercise Action Plan Clinical staff conducted group or individual video education with verbal and written material and guidebook.  Patient learns the recommended strategies to achieve and enjoy long-term exercise adherence, including variety, self-motivation, self-efficacy, and positive decision making. Benefits of exercise include fitness, good health, weight management, more energy, better sleep, less stress, and overall well-being.  Medical   Heart Disease Risk Reduction Clinical staff conducted group or individual video education with verbal and written material and guidebook.  Patient learns our heart is our most vital organ as it circulates oxygen, nutrients, white blood cells, and hormones throughout the entire body, and carries waste away. Data supports a plant-based eating plan like the Pritikin Program for its effectiveness in slowing progression of and reversing heart disease. The video provides a number of recommendations to address heart disease.   Metabolic Syndrome and Belly Fat  Clinical staff conducted group or individual video education with verbal and written material and guidebook.  Patient learns what metabolic syndrome is, how it leads to heart disease, and how one can reverse it and keep it from coming back. You have metabolic syndrome if you have 3 of the following 5 criteria: abdominal obesity, high blood pressure, high triglycerides, low HDL cholesterol, and high blood sugar.  Hypertension and Heart Disease Clinical staff conducted group or individual video education with verbal and written material and guidebook.  Patient learns that high blood pressure, or hypertension, is very common in the [place]. Hypertension is largely due to excessive salt intake, but other important risk factors include being overweight, physical inactivity, drinking too much alcohol, smoking, and not eating enough potassium from fruits and vegetables. High  blood pressure is a leading risk factor for heart attack, stroke, congestive heart failure, dementia, kidney failure, and premature death. Long-term effects of excessive salt intake include stiffening of the arteries and thickening of heart muscle and organ damage. Recommendations include ways to reduce hypertension and the risk of heart disease.  Diseases of Our Time - Focusing on Diabetes Clinical staff conducted group or individual video education with verbal and written material and guidebook.  Patient learns why the best way to stop diseases of our time  is prevention, through food and other lifestyle changes. Medicine (such as prescription pills and surgeries) is often only a Band-Aid on the problem, not a long-term solution. Most common diseases of our time include obesity, type 2 diabetes, hypertension, heart disease, and cancer. The Pritikin Program is recommended and has been proven to help reduce, reverse, and/or prevent the damaging effects of metabolic syndrome.  Nutrition   Overview of the Pritikin Eating Plan  Clinical staff conducted group or individual video education with verbal and written material and guidebook.  Patient learns about the [place] for disease risk reduction. The [place] emphasizes a wide variety of unrefined, minimally-processed carbohydrates, like fruits, vegetables, whole grains, and legumes. Go, Caution, and Stop food choices are explained. Plant-based and lean animal proteins are emphasized. Rationale provided for low sodium intake for blood pressure control, low added sugars for blood sugar stabilization, and low added fats and oils for coronary artery disease risk reduction and weight management.  Calorie Density  Clinical staff conducted group or individual video education with verbal and written material and guidebook.  Patient learns about calorie density and how it impacts the Pritikin Eating Plan. Knowing the characteristics of the  food you choose will help you decide whether those foods will lead to weight gain or weight loss, and whether you want to consume more or less of them. Weight loss is usually a side effect of the Pritikin Eating Plan because of its focus on low calorie-dense foods.  Label Reading  Clinical staff conducted group or individual video education with verbal and written material and guidebook.  Patient learns about the Pritikin recommended label reading guidelines and corresponding recommendations regarding calorie density, added sugars, sodium content, and whole grains.  Dining Out - Part 1  Clinical staff conducted group or individual video education with verbal and written material and guidebook.  Patient learns that restaurant meals can be sabotaging because they can be so high in calories, fat, sodium, and/or sugar. Patient learns recommended strategies on how to positively address this and avoid unhealthy pitfalls.  Facts on Fats  Clinical staff conducted group or individual video education with verbal and written material and guidebook.  Patient learns that lifestyle modifications can be just as effective, if not more so, as many medications for lowering your risk of heart disease. A Pritikin lifestyle can help to reduce your risk of inflammation and atherosclerosis (cholesterol build-up, or plaque, in the artery walls). Lifestyle interventions such as dietary choices and physical activity address the cause of atherosclerosis. A review of the types of fats and their impact on blood cholesterol levels, along with dietary recommendations to reduce fat intake is also included.  Nutrition Action Plan  Clinical staff conducted group or individual video education with verbal and written material and guidebook.  Patient learns how to incorporate Pritikin recommendations into their lifestyle. Recommendations include planning and keeping personal health goals in mind as an important part of their  success.  Healthy Mind-Set    Healthy Minds, Bodies, Hearts  Clinical staff conducted group or individual video education with verbal and written material and guidebook.  Patient learns how to identify when they are stressed. Video will discuss the impact of that stress, as well as the many benefits of stress management. Patient will also be introduced to stress management techniques. The way we think, act, and feel has an impact on our hearts.  How Our Thoughts Can Heal Our Hearts  Clinical staff conducted group or individual video  education with verbal and written material and guidebook.  Patient learns that negative thoughts can cause depression and anxiety. This can result in negative lifestyle behavior and serious health problems. Cognitive behavioral therapy is an effective method to help control our thoughts in order to change and improve our emotional outlook.  Additional Videos:  Exercise    Improving Performance  Clinical staff conducted group or individual video education with verbal and written material and guidebook.  Patient learns to use a non-linear approach by alternating intensity levels and lengths of time spent exercising to help burn more calories and lose more body fat. Cardiovascular exercise helps improve heart health, metabolism, hormonal balance, blood sugar control, and recovery from fatigue. Resistance training improves strength, endurance, balance, coordination, reaction time, metabolism, and muscle mass. Flexibility exercise improves circulation, posture, and balance. Seek guidance from your physician and exercise physiologist before implementing an exercise routine and learn your capabilities and proper form for all exercise.  Introduction to Yoga  Clinical staff conducted group or individual video education with verbal and written material and guidebook.  Patient learns about yoga, a discipline of the coming together of mind, breath, and body. The benefits of yoga  include improved flexibility, improved range of motion, better posture and core strength, increased lung function, weight loss, and positive self-image. Yoga's heart health benefits include lowered blood pressure, healthier heart rate, decreased cholesterol and triglyceride levels, improved immune function, and reduced stress. Seek guidance from your physician and exercise physiologist before implementing an exercise routine and learn your capabilities and proper form for all exercise.  Medical   Aging: Enhancing Your Quality of Life  Clinical staff conducted group or individual video education with verbal and written material and guidebook.  Patient learns key strategies and recommendations to stay in good physical health and enhance quality of life, such as prevention strategies, having an advocate, securing a [place], and keeping a list of medications and system for tracking them. It also discusses how to avoid risk for bone loss.  Biology of Weight Control  Clinical staff conducted group or individual video education with verbal and written material and guidebook.  Patient learns that weight gain occurs because we consume more calories than we burn (eating more, moving less). Even if your body weight is normal, you may have higher ratios of fat compared to muscle mass. Too much body fat puts you at increased risk for cardiovascular disease, heart attack, stroke, type 2 diabetes, and obesity-related cancers. In addition to exercise, following the [place] can help reduce your risk.  Decoding Lab Results  Clinical staff conducted group or individual video education with verbal and written material and guidebook.  Patient learns that lab test reflects one measurement whose values change over time and are influenced by many factors, including medication, stress, sleep, exercise, food, hydration, pre-existing medical conditions, and more. It is recommended to  use the knowledge from this video to become more involved with your lab results and evaluate your numbers to speak with your doctor.   Diseases of Our Time - Overview  Clinical staff conducted group or individual video education with verbal and written material and guidebook.  Patient learns that according to the CDC, 50% to 70% of chronic diseases (such as obesity, type 2 diabetes, elevated lipids, hypertension, and heart disease) are avoidable through lifestyle improvements including healthier food choices, listening to satiety cues, and increased physical activity.  Sleep Disorders Clinical staff conducted group or individual  video education with verbal and written material and guidebook.  Patient learns how good quality and duration of sleep are important to overall health and well-being. Patient also learns about sleep disorders and how they impact health along with recommendations to address them, including discussing with a physician.  Nutrition  Dining Out - Part 2 Clinical staff conducted group or individual video education with verbal and written material and guidebook.  Patient learns how to plan ahead and communicate in order to maximize their dining experience in a healthy and nutritious manner. Included are recommended food choices based on the type of restaurant the patient is visiting.   Fueling a Best boy conducted group or individual video education with verbal and written material and guidebook.  There is a strong connection between our food choices and our health. Diseases like obesity and type 2 diabetes are very prevalent and are in large-part due to lifestyle choices. The Pritikin Eating Plan provides plenty of food and hunger-curbing satisfaction. It is easy to follow, affordable, and helps reduce health risks.  Menu Workshop  Clinical staff conducted group or individual video education with verbal and written material and guidebook.  Patient learns  that restaurant meals can sabotage health goals because they are often packed with calories, fat, sodium, and sugar. Recommendations include strategies to plan ahead and to communicate with the manager, chef, or server to help order a healthier meal.  Planning Your Eating Strategy  Clinical staff conducted group or individual video education with verbal and written material and guidebook.  Patient learns about the [place] and its benefit of reducing the risk of disease. The [place] does not focus on calories. Instead, it emphasizes high-quality, nutrient-rich foods. By knowing the characteristics of the foods, we choose, we can determine their calorie density and make informed decisions.  Targeting Your Nutrition Priorities  Clinical staff conducted group or individual video education with verbal and written material and guidebook.  Patient learns that lifestyle habits have a tremendous impact on disease risk and progression. This video provides eating and physical activity recommendations based on your personal health goals, such as reducing LDL cholesterol, losing weight, preventing or controlling type 2 diabetes, and reducing high blood pressure.  Vitamins and Minerals  Clinical staff conducted group or individual video education with verbal and written material and guidebook.  Patient learns different ways to obtain key vitamins and minerals, including through a recommended healthy diet. It is important to discuss all supplements you take with your doctor.   Healthy Mind-Set    Smoking Cessation  Clinical staff conducted group or individual video education with verbal and written material and guidebook.  Patient learns that cigarette smoking and tobacco addiction pose a serious health risk which affects millions of people. Stopping smoking will significantly reduce the risk of heart disease, lung disease, and many forms of cancer. Recommended strategies for quitting  are covered, including working with your doctor to develop a successful plan.  Culinary   Becoming a Financial trader conducted group or individual video education with verbal and written material and guidebook.  Patient learns that cooking at home can be healthy, cost-effective, quick, and puts them in control. Keys to cooking healthy recipes will include looking at your recipe, assessing your equipment needs, planning ahead, making it simple, choosing cost-effective seasonal ingredients, and limiting the use of added fats, salts, and sugars.  Cooking - Breakfast and Snacks  Clinical staff conducted group or individual  video education with verbal and written material and guidebook.  Patient learns how important breakfast is to satiety and nutrition through the entire day. Recommendations include key foods to eat during breakfast to help stabilize blood sugar levels and to prevent overeating at meals later in the day. Planning ahead is also a key component.  Cooking - Human resources officer conducted group or individual video education with verbal and written material and guidebook.  Patient learns eating strategies to improve overall health, including an approach to cook more at home. Recommendations include thinking of animal protein as a side on your plate rather than center stage and focusing instead on lower calorie dense options like vegetables, fruits, whole grains, and plant-based proteins, such as beans. Making sauces in large quantities to freeze for later and leaving the skin on your vegetables are also recommended to maximize your experience.  Cooking - Healthy Salads and Dressing Clinical staff conducted group or individual video education with verbal and written material and guidebook.  Patient learns that vegetables, fruits, whole grains, and legumes are the foundations of the [place]. Recommendations include how to incorporate each of these in  flavorful and healthy salads, and how to create homemade salad dressings. Proper handling of ingredients is also covered. Cooking - Soups and [employer] - Soups and Desserts Clinical staff conducted group or individual video education with verbal and written material and guidebook.  Patient learns that Pritikin soups and desserts make for easy, nutritious, and delicious snacks and meal components that are low in sodium, fat, sugar, and calorie density, while high in vitamins, minerals, and filling fiber. Recommendations include simple and healthy ideas for soups and desserts.   Overview     The Pritikin Solution Program Overview Clinical staff conducted group or individual video education with verbal and written material and guidebook.  Patient learns that the results of the [place] Program have been documented in more than 100 articles published in peer-reviewed journals, and the benefits include reducing risk factors for (and, in some cases, even reversing) high cholesterol, high blood pressure, type 2 diabetes, obesity, and more! An overview of the three key pillars of the Pritikin Program will be covered: eating well, doing regular exercise, and having a healthy mind-set.  WORKSHOPS  Exercise: Exercise Basics: Building Your Action Plan Clinical staff led group instruction and group discussion with PowerPoint presentation and patient guidebook. To enhance the learning environment the use of posters, models and videos may be added. At the conclusion of this workshop, patients will comprehend the difference between physical activity and exercise, as well as the benefits of incorporating both, into their routine. Patients will understand the FITT (Frequency, Intensity, Time, and Type) principle and how to use it to build an exercise action plan. In addition, safety concerns and other considerations for exercise and cardiac rehab will be addressed by the presenter. The purpose of this  lesson is to promote a comprehensive and effective weekly exercise routine in order to improve patients' overall level of fitness.   Managing Heart Disease: Your Path to a Healthier Heart Clinical staff led group instruction and group discussion with PowerPoint presentation and patient guidebook. To enhance the learning environment the use of posters, models and videos may be added.At the conclusion of this workshop, patients will understand the anatomy and physiology of the heart. Additionally, they will understand how Pritikin's three pillars impact the risk factors, the progression, and the management of heart disease.  The purpose of  this lesson is to provide a high-level overview of the heart, heart disease, and how the Pritikin lifestyle positively impacts risk factors.  Exercise Biomechanics Clinical staff led group instruction and group discussion with PowerPoint presentation and patient guidebook. To enhance the learning environment the use of posters, models and videos may be added. Patients will learn how the structural parts of their bodies function and how these functions impact their daily activities, movement, and exercise. Patients will learn how to promote a neutral spine, learn how to manage pain, and identify ways to improve their physical movement in order to promote healthy living. The purpose of this lesson is to expose patients to common physical limitations that impact physical activity. Participants will learn practical ways to adapt and manage aches and pains, and to minimize their effect on regular exercise. Patients will learn how to maintain good posture while sitting, walking, and lifting.  Balance Training and Fall Prevention  Clinical staff led group instruction and group discussion with PowerPoint presentation and patient guidebook. To enhance the learning environment the use of posters, models and videos may be added. At the conclusion of this workshop,  patients will understand the importance of their sensorimotor skills (vision, proprioception, and the vestibular system) in maintaining their ability to balance as they age. Patients will apply a variety of balancing exercises that are appropriate for their current level of function. Patients will understand the common causes for poor balance, possible solutions to these problems, and ways to modify their physical environment in order to minimize their fall risk. The purpose of this lesson is to teach patients about the importance of maintaining balance as they age and ways to minimize their risk of falling.  WORKSHOPS   Nutrition:  Fueling a Scientist, research (physical sciences) led group instruction and group discussion with PowerPoint presentation and patient guidebook. To enhance the learning environment the use of posters, models and videos may be added. Patients will review the foundational principles of the [place] and understand what constitutes a serving size in each of the food groups. Patients will also learn Pritikin-friendly foods that are better choices when away from home and review make-ahead meal and snack options. Calorie density will be reviewed and applied to three nutrition priorities: weight maintenance, weight loss, and weight gain. The purpose of this lesson is to reinforce (in a group setting) the key concepts around what patients are recommended to eat and how to apply these guidelines when away from home by planning and selecting Pritikin-friendly options. Patients will understand how calorie density may be adjusted for different weight management goals.  Mindful Eating  Clinical staff led group instruction and group discussion with PowerPoint presentation and patient guidebook. To enhance the learning environment the use of posters, models and videos may be added. Patients will briefly review the concepts of the [place] and the importance of low-calorie dense  foods. The concept of mindful eating will be introduced as well as the importance of paying attention to internal hunger signals. Triggers for non-hunger eating and techniques for dealing with triggers will be explored. The purpose of this lesson is to provide patients with the opportunity to review the basic principles of the [place], discuss the value of eating mindfully and how to measure internal cues of hunger and fullness using the Hunger Scale. Patients will also discuss reasons for non-hunger eating and learn strategies to use for controlling emotional eating.  Targeting Your Nutrition Priorities Clinical staff led group instruction  and group discussion with PowerPoint presentation and patient guidebook. To enhance the learning environment the use of posters, models and videos may be added. Patients will learn how to determine their genetic susceptibility to disease by reviewing their family history. Patients will gain insight into the importance of diet as part of an overall healthy lifestyle in mitigating the impact of genetics and other environmental insults. The purpose of this lesson is to provide patients with the opportunity to assess their personal nutrition priorities by looking at their family history, their own health history and current risk factors. Patients will also be able to discuss ways of prioritizing and modifying the [place] for their highest risk areas  Menu  Clinical staff led group instruction and group discussion with PowerPoint presentation and patient guidebook. To enhance the learning environment the use of posters, models and videos may be added. Using menus brought in from [employer], or printed from [employer], patients will apply the [place] dining out guidelines that were presented in the [employer] video. Patients will also be able to practice these guidelines in a variety of provided scenarios. The purpose of  this lesson is to provide patients with the opportunity to practice hands-on learning of the [place] with actual menus and practice scenarios.  Label Reading Clinical staff led group instruction and group discussion with PowerPoint presentation and patient guidebook. To enhance the learning environment the use of posters, models and videos may be added. Patients will review and discuss the Pritikin label reading guidelines presented in [employer]'s Label Reading Educational series video. Using fool labels brought in from local grocery stores and markets, patients will apply the label reading guidelines and determine if the packaged food meet the [employer] guidelines. The purpose of this lesson is to provide patients with the opportunity to review, discuss, and practice hands-on learning of the [employer] Label Reading guidelines with actual packaged food labels. [facility]hops are designed to teach patients ways to prepare quick, simple, and affordable recipes at home. The importance of nutrition's role in chronic disease risk reduction is reflected in its emphasis in the overall [employer] program. By learning how to prepare essential core [employer] Eating Plan recipes, patients will increase control over what they eat; be able to customize the flavor of foods without the use of added salt, sugar, or fat; and improve the quality of the food they consume. By learning a set of core recipes which are easily assembled, quickly prepared, and affordable, patients are more likely to prepare more healthy foods at home. These workshops focus on convenient breakfasts, simple entres, side dishes, and desserts which can be prepared with minimal effort and are consistent with nutrition recommendations for cardiovascular risk reduction. Cooking [employer] are taught by a Engineer, materials (RD) who has been trained by the [employer]. The  chef or RD has a clear understanding of the importance of minimizing - if not completely eliminating - added fat, sugar, and sodium in recipes. Throughout the series of [place] Workshop sessions, patients will learn about healthy ingredients and efficient methods of cooking to build confidence in their capability to prepare    Cooking School weekly topics:  Adding Flavor- Sodium-Free  Fast and Healthy Breakfasts  Powerhouse Plant-Based Proteins  Satisfying Salads and Dressings  Simple Sides and Sauces  International Cuisine-Spotlight on the [place] Zones  Delicious Desserts  Savory Soups  [employer] - Meals in  a Snap  Tasty Appetizers and Snacks  Comforting Weekend Breakfasts  One-Pot Wonders   Fast Evening Meals  Contractor Your Prit[employer]in Plate  WORKSHOPS   Healthy Mindset (Psychosocial):  Focused Goals, Sustainable Changes Clinical staff led group instruction and group discussion with PowerPoint presentation and patient guidebook. To enhance the learning environment the use of posters, models and videos may be added. Patients will be able to apply effective goal setting strategies to establish at least one personal goal, and then take consistent, meaningful action toward that goal. They will learn to identify common barriers to achieving personal goals and develop strategies to overcome them. Patients will also gain an understanding of how our mind-set can impact our ability to achieve goals and the importance of cultivating a positive and growth-oriented mind-set. The purpose of this lesson is to provide patients with a deeper understanding of how to set and achieve personal goals, as well as the tools and strategies needed to overcome common obstacles which may arise along the way.  From Head to Heart: The Power of a Healthy Outlook  Clinical staff led group instruction and group discussion with PowerPoint presentation and patient guidebook. To  enhance the learning environment the use of posters, models and videos may be added. Patients will be able to recognize and describe the impact of emotions and mood on physical health. They will discover the importance of self-care and explore self-care practices which may work for them. Patients will also learn how to utilize the 4 C's to cultivate a healthier outlook and better manage stress and challenges. The purpose of this lesson is to demonstrate to patients how a healthy outlook is an essential part of maintaining good health, especially as they continue their cardiac rehab journey.  Healthy Sleep for a Healthy Heart Clinical staff led group instruction and group discussion with PowerPoint presentation and patient guidebook. To enhance the learning environment the use of posters, models and videos may be added. At the conclusion of this workshop, patients will be able to demonstrate knowledge of the importance of sleep to overall health, well-being, and quality of life. They will understand the symptoms of, and treatments for, common sleep disorders. Patients will also be able to identify daytime and nighttime behaviors which impact sleep, and they will be able to apply these tools to help manage sleep-related challenges. The purpose of this lesson is to provide patients with a general overview of sleep and outline the importance of quality sleep. Patients will learn about a few of the most common sleep disorders. Patients will also be introduced to the concept of "sleep hygiene," and discover ways to self-manage certain sleeping problems through simple daily behavior changes. Finally, the workshop will motivate patients by clarifying the links between quality sleep and their goals of heart-healthy living.   Recognizing and Reducing Stress Clinical staff led group instruction and group discussion with PowerPoint presentation and patient guidebook. To enhance the learning environment the use of posters,  models and videos may be added. At the conclusion of this workshop, patients will be able to understand the types of stress reactions, differentiate between acute and chronic stress, and recognize the impact that chronic stress has on their health. They will also be able to apply different coping mechanisms, such as reframing negative self-talk. Patients will have the opportunity to practice a variety of stress management techniques, such as deep abdominal breathing, progressive muscle relaxation, and/or guided imagery.  The purpose of this lesson is to educate  patients on the role of stress in their lives and to provide healthy techniques for coping with it.  Learning Barriers/Preferences:  Learning Barriers/Preferences - 05/14/22 0944       Learning Barriers/Preferences   Learning Barriers Sight;Hearing   wears glasses/ hearing aids   Learning Preferences Computer/Internet;Pictoral;Written Material             Education Topics:  Knowledge Questionnaire Score:  Knowledge Questionnaire Score - 05/14/22 0945       Knowledge Questionnaire Score   Pre Score 24/24             Core Components/Risk Factors/Patient Goals at Admission:  Personal Goals and Risk Factors at Admission - 05/14/22 0946       Core Components/Risk Factors/Patient Goals on Admission    Weight Management Weight Maintenance;Yes    Intervention Weight Management: Develop a combined nutrition and exercise program designed to reach desired caloric intake, while maintaining appropriate intake of nutrient and fiber, sodium and fats, and appropriate energy expenditure required for the weight goal.;Weight Management: Provide education and appropriate resources to help participant work on and attain dietary goals.    Expected Outcomes Short Term: Continue to assess and modify interventions until short term weight is achieved;Long Term: Adherence to nutrition and physical activity/exercise program aimed toward attainment  of established weight goal;Weight Maintenance: Understanding of the daily nutrition guidelines, which includes 25-35% calories from fat, 7% or less cal from saturated fats, less than 200mg  cholesterol, less than 1.5gm of sodium, & 5 or more servings of fruits and vegetables daily;Understanding recommendations for meals to include 15-35% energy as protein, 25-35% energy from fat, 35-60% energy from carbohydrates, less than 200mg  of dietary cholesterol, 20-35 gm of total fiber daily;Understanding of distribution of calorie intake throughout the day with the consumption of 4-5 meals/snacks    Diabetes Yes    Intervention Provide education about signs/symptoms and action to take for hypo/hyperglycemia.;Provide education about proper nutrition, including hydration, and aerobic/resistive exercise prescription along with prescribed medications to achieve blood glucose in normal ranges: Fasting glucose 65-99 mg/dL    Expected Outcomes Short Term: Participant verbalizes understanding of the signs/symptoms and immediate care of hyper/hypoglycemia, proper foot care and importance of medication, aerobic/resistive exercise and nutrition plan for blood glucose control.;Long Term: Attainment of HbA1C < 7%.    Lipids Yes    Intervention Provide education and support for participant on nutrition & aerobic/resistive exercise along with prescribed medications to achieve LDL 70mg , HDL >40mg .    Expected Outcomes Short Term: Participant states understanding of desired cholesterol values and is compliant with medications prescribed. Participant is following exercise prescription and nutrition guidelines.;Long Term: Cholesterol controlled with medications as prescribed, with individualized exercise RX and with personalized nutrition plan. Value goals: LDL < 70mg , HDL > 40 mg.             Core Components/Risk Factors/Patient Goals Review:   Goals and Risk Factor Review     Row Name 05/20/22 0742 06/03/22 1632            Core Components/Risk Factors/Patient Goals Review   Personal Goals Review Weight Management/Obesity;Lipids;Diabetes Weight Management/Obesity;Lipids;Diabetes      Review Steven Lawrence started intesive cardiac rehab on 05/19/22 and did well with exercise. Vital signs WNL. CBG 136 pre, post exercise CBG 86. Patient was given orange juice. Ate Bran flakes for breakfast ask to add protein Steven Lawrence started intesive cardiac rehab on 05/19/22 and is off to a good start to exercise. Vital signs and CBG's have been  stable      Expected Outcomes Steven Lawrence will continue to participate in intensive cardiac rehab for exercise, nutrition and lifestyle modifications Steven Lawrence will continue to participate in intensive cardiac rehab for exercise, nutrition and lifestyle modifications               Core Components/Risk Factors/Patient Goals at Discharge (Final Review):   Goals and Risk Factor Review - 06/03/22 1632       Core Components/Risk Factors/Patient Goals Review   Personal Goals Review Weight Management/Obesity;Lipids;Diabetes    Review Steven Lawrence started intesive cardiac rehab on 05/19/22 and is off to a good start to exercise. Vital signs and CBG's have been stable    Expected Outcomes Steven Lawrence will continue to participate in intensive cardiac rehab for exercise, nutrition and lifestyle modifications             ITP Comments:  ITP Comments     Row Name 05/14/22 0811 05/20/22 0739 06/03/22 1628       ITP Comments Dr. Fransico Him medical director. Introduction to pritikin education program/ intensive cardiac rehab. Initial orientation packet reviewed with patient. 30 Day ITP Review. Steven Lawrence started intesive cardiac rehab on 05/19/22 and did well with exercise 30 Day ITP Review. Steven Lawrence has good attendance and participation in intensive cardiac rehab. Steven Lawrence is off to a good start to exercise              Comments: See ITP Comments

## 2022-06-06 ENCOUNTER — Encounter (HOSPITAL_COMMUNITY)
Admission: RE | Admit: 2022-06-06 | Discharge: 2022-06-06 | Disposition: A | Discharge status: Home / Self Care | Payer: Medicare Other | Source: Ambulatory Visit | Attending: Cardiology | Admitting: Cardiology

## 2022-06-06 DIAGNOSIS — Z955 Presence of coronary angioplasty implant and graft: Secondary | ICD-10-CM | POA: Diagnosis not present

## 2022-06-09 ENCOUNTER — Encounter (HOSPITAL_COMMUNITY)
Admission: RE | Admit: 2022-06-09 | Discharge: 2022-06-09 | Disposition: A | Discharge status: Home / Self Care | Payer: Medicare Other | Source: Ambulatory Visit | Attending: Cardiology | Admitting: Cardiology

## 2022-06-09 DIAGNOSIS — Z955 Presence of coronary angioplasty implant and graft: Secondary | ICD-10-CM | POA: Diagnosis not present

## 2022-06-09 NOTE — Progress Notes (Signed)
Reviewed home exercise guidelines with patient including endpoints, temperature precautions, target heart rate and rate of perceived exertion. Patient is currently walking and weight training at the gym as his mode of home exercise. Patient voices understanding of instructions given.  Sol Passer, MS, ACSM CEP

## 2022-06-11 ENCOUNTER — Encounter (HOSPITAL_COMMUNITY)
Admission: RE | Admit: 2022-06-11 | Discharge: 2022-06-11 | Disposition: A | Discharge status: Home / Self Care | Payer: Medicare Other | Source: Ambulatory Visit | Attending: Cardiology | Admitting: Cardiology

## 2022-06-11 DIAGNOSIS — Z955 Presence of coronary angioplasty implant and graft: Secondary | ICD-10-CM

## 2022-06-13 ENCOUNTER — Encounter (HOSPITAL_COMMUNITY)
Admission: RE | Admit: 2022-06-13 | Discharge: 2022-06-13 | Disposition: A | Discharge status: Home / Self Care | Payer: Medicare Other | Source: Ambulatory Visit | Attending: Cardiology | Admitting: Cardiology

## 2022-06-13 DIAGNOSIS — Z955 Presence of coronary angioplasty implant and graft: Secondary | ICD-10-CM | POA: Diagnosis not present

## 2022-06-16 ENCOUNTER — Encounter (HOSPITAL_COMMUNITY)
Admission: RE | Admit: 2022-06-16 | Discharge: 2022-06-16 | Disposition: A | Discharge status: Home / Self Care | Payer: Medicare Other | Source: Ambulatory Visit | Attending: Cardiology | Admitting: Cardiology

## 2022-06-16 DIAGNOSIS — Z48812 Encounter for surgical aftercare following surgery on the circulatory system: Secondary | ICD-10-CM | POA: Insufficient documentation

## 2022-06-16 DIAGNOSIS — Z955 Presence of coronary angioplasty implant and graft: Secondary | ICD-10-CM | POA: Insufficient documentation

## 2022-06-18 ENCOUNTER — Encounter (HOSPITAL_COMMUNITY)
Admission: RE | Admit: 2022-06-18 | Discharge: 2022-06-18 | Disposition: A | Discharge status: Home / Self Care | Payer: Medicare Other | Source: Ambulatory Visit | Attending: Cardiology | Admitting: Cardiology

## 2022-06-18 DIAGNOSIS — Z48812 Encounter for surgical aftercare following surgery on the circulatory system: Secondary | ICD-10-CM | POA: Diagnosis not present

## 2022-06-18 DIAGNOSIS — Z955 Presence of coronary angioplasty implant and graft: Secondary | ICD-10-CM

## 2022-06-18 IMAGING — CT CT CHEST LUNG CANCER SCREENING LOW DOSE W/O CM
1 of 2 series · 15 of 32 positions shown, 19 images · non-contrast
Comparison: CT lung cancer screening dated August 01, 2019

CLINICAL DATA: Former smoker, 54 pack-year history

EXAM:
CT CHEST WITHOUT CONTRAST LOW-DOSE FOR LUNG CANCER SCREENING
TECHNIQUE: Multidetector CT imaging of the chest was performed following the
standard protocol without IV contrast.

[Series 5: ldct screen lung · axial · 0.78mm/px · z∈[-352,-22]mm · 15 of 364 slices shown, 19 images]
[im 17/364  mediastinal]
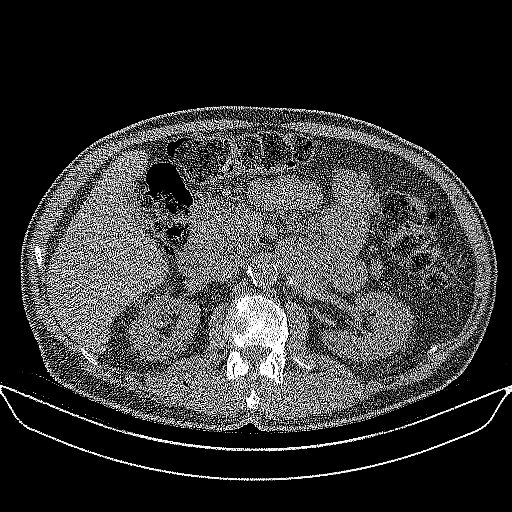
[im 17/364  lung]
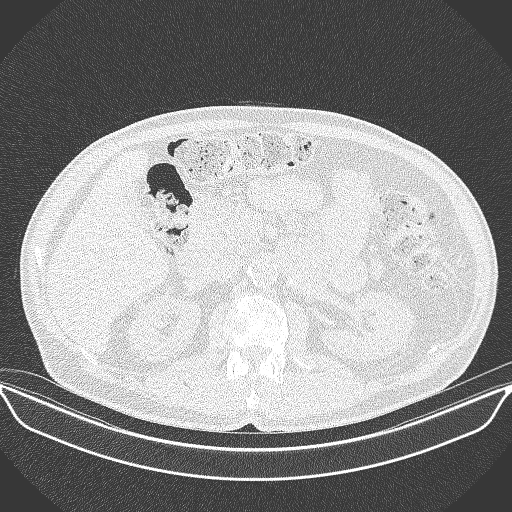
[im 50/364  lung]
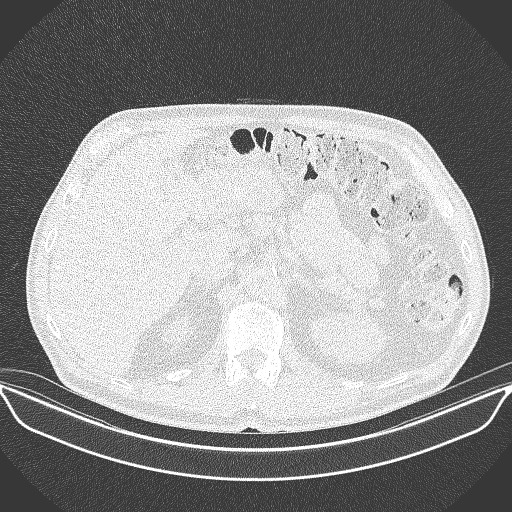
[im 83/364  lung]
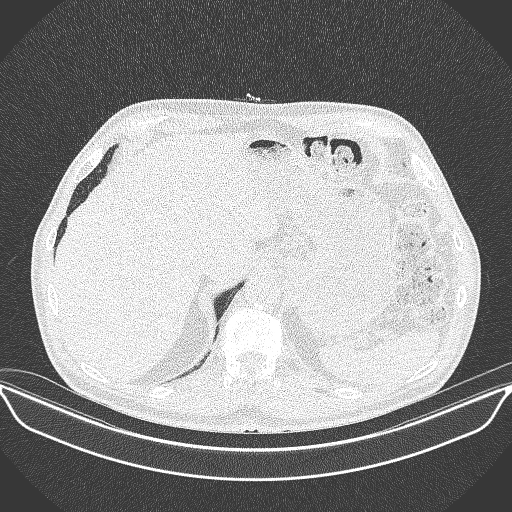
[im 91/364  lung]
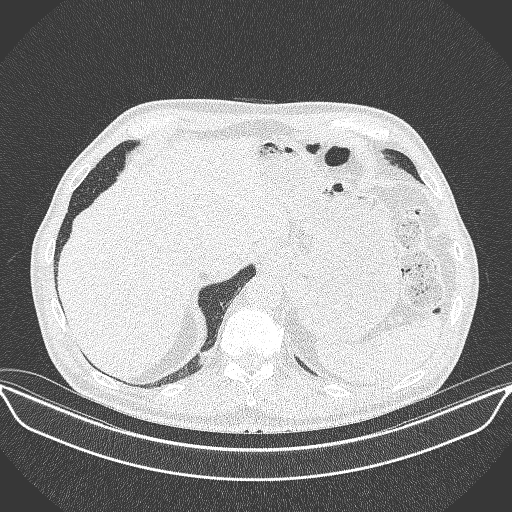
[im 116/364  mediastinal]
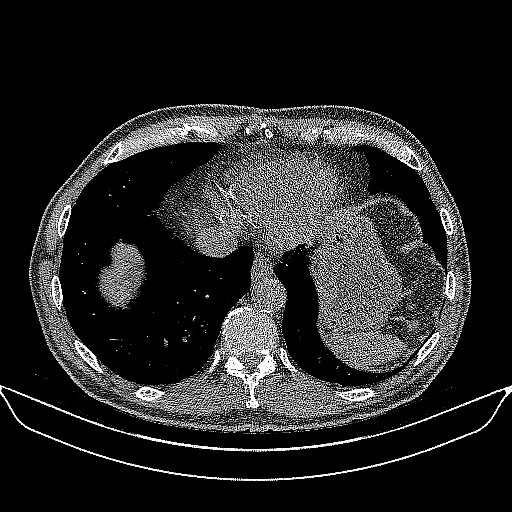
[im 116/364  lung]
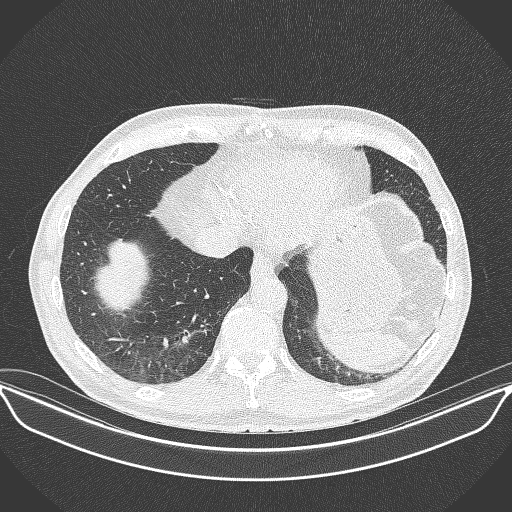
[im 133/364  lung]
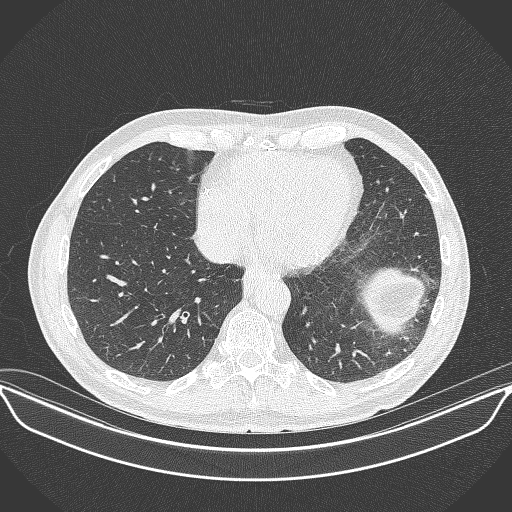
[im 166/364  lung]
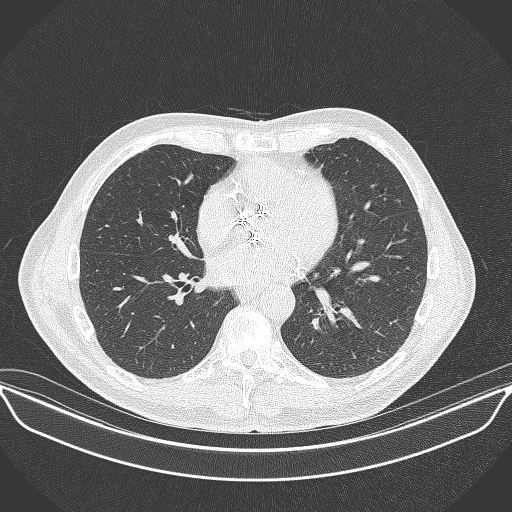
[im 182/364  lung]
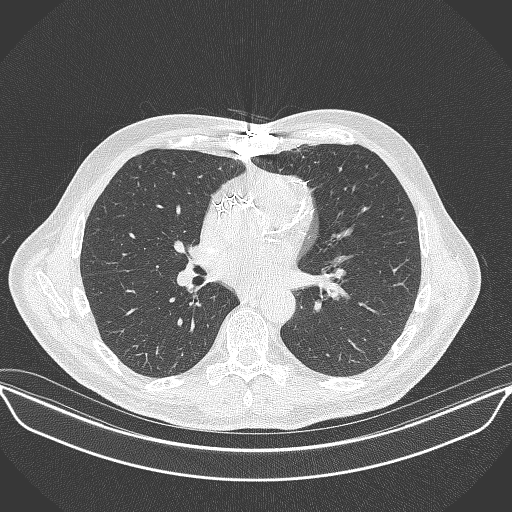
[im 199/364  mediastinal]
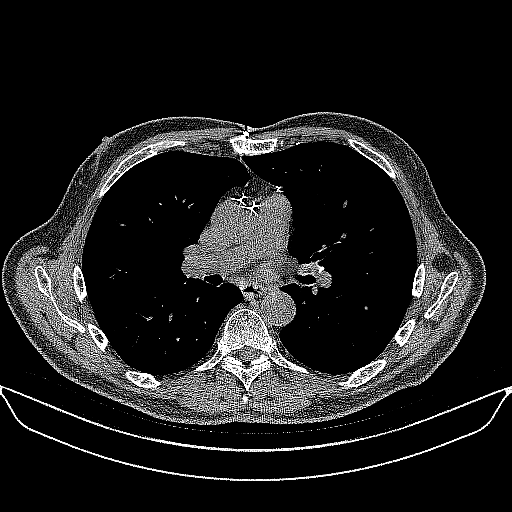
[im 199/364  lung]
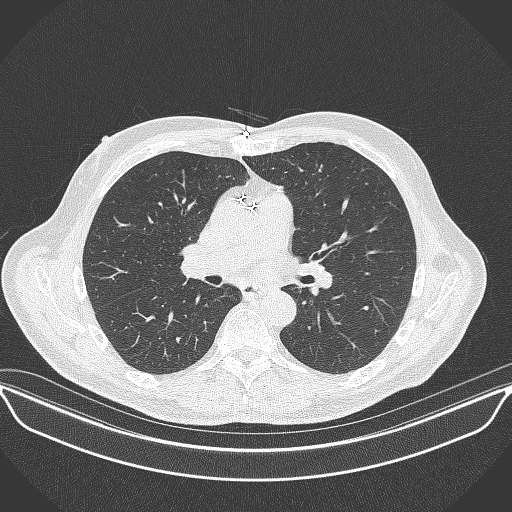
[im 232/364  lung]
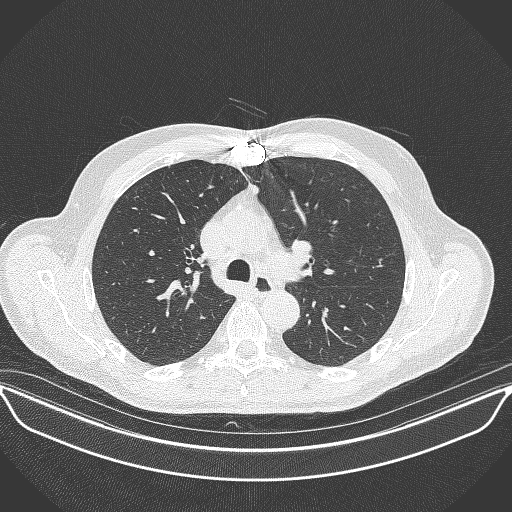
[im 248/364  lung]
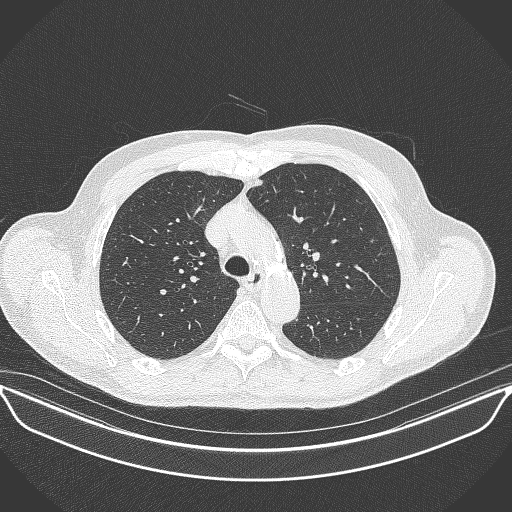
[im 273/364  lung]
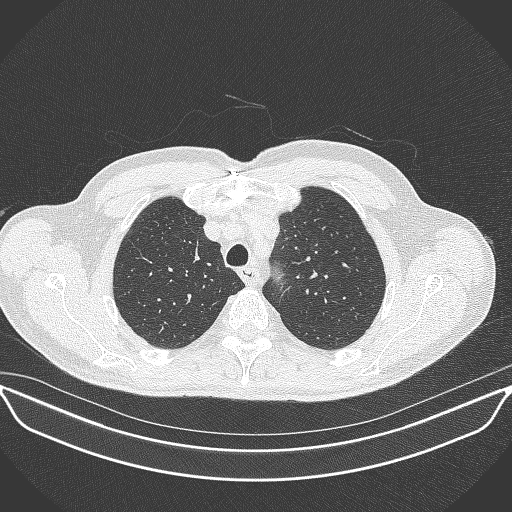
[im 298/364  mediastinal]
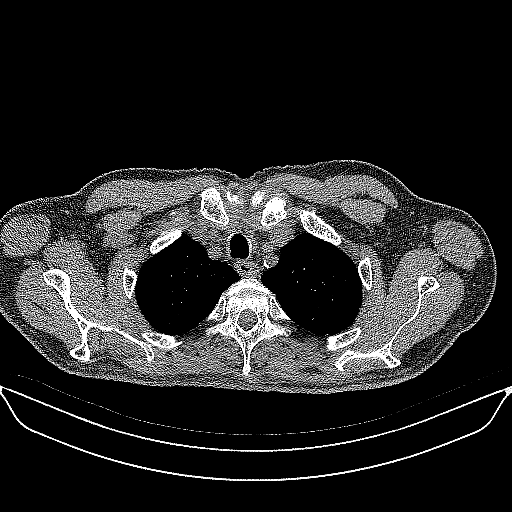
[im 298/364  lung]
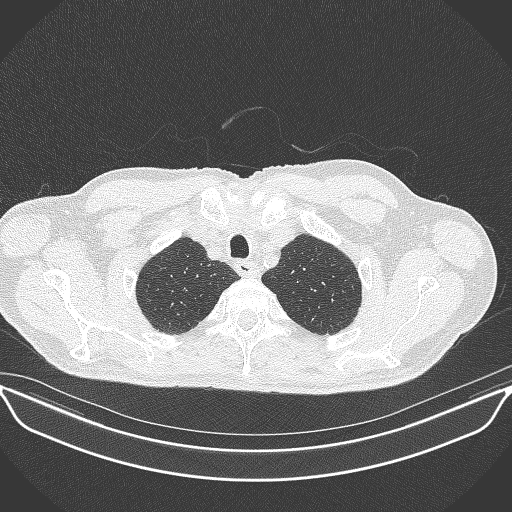
[im 314/364  lung]
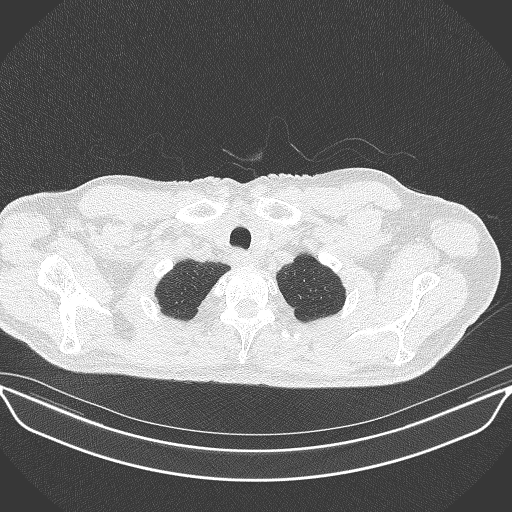
[im 347/364  lung]
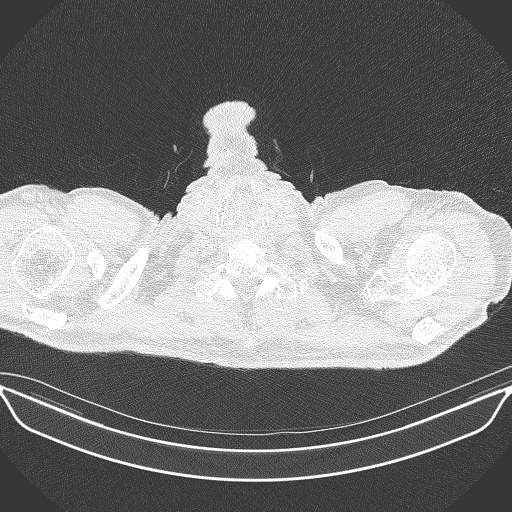

[15 of 32 positions shown; findings below may reference images not displayed]

FINDINGS: Cardiovascular: Normal heart size. No pericardial effusion.
Three-vessel coronary artery calcifications status post CABG. Prior
aortic valve replacement.

Mediastinum/Nodes: No pathologically enlarged lymph nodes seen in
the chest. Thyroid and esophagus are unremarkable.

Lungs/Pleura: Central airways are patent. Bronchial wall thickening
with areas of mucous plugging seen in the right posterolateral
bronchi. No consolidation, pleural effusion or pneumothorax. Stable
calcified and noncalcified pulmonary nodules. Reference 2 mm
pulmonary nodule right upper lobe located on series 5, image 94.

Upper Abdomen: No acute abnormality.

Musculoskeletal: Sternotomy wires appear aligned and intact. No
aggressive appearing osseous lesions.
IMPRESSION: Lung-RADS 2, benign appearance or behavior. Continue annual
screening with low-dose chest CT without contrast in 12 months.

Aortic Atherosclerosis ([id number]).

## 2022-06-20 ENCOUNTER — Encounter (HOSPITAL_COMMUNITY): Payer: Medicare Other

## 2022-06-20 ENCOUNTER — Telehealth (HOSPITAL_COMMUNITY): Payer: Self-pay | Admitting: *Deleted

## 2022-06-20 NOTE — Telephone Encounter (Signed)
-----   Message from Lewayne Bunting, MD sent at 06/19/2022  7:26 AM EDT ----- Regarding: RE: Clearance to jog, target heart rate increase Ok as outlined Olga Millers  ----- Message ----- From: Artist Pais Sent: 06/18/2022   4:02 PM EDT To: Lewayne Bunting, MD Subject: Clearance to jog, target heart rate increase   Greetings Dr. Jens Som,   Your patient Steven Lawrence has been in the cardiac rehabilitation program approximately 5 weeks and is doing well. Patient would like to begin jogging. His current MET level is 3.4. Patient would also need a target heart rate increase from 59-119 bpm (40-80% age predicted HR) to 59-134 (40-90% age predicted HR). Please indicate if you are agreeable to this change in exercise prescription.  We appreciate your assistance and referral of your patient to our program.  Artist Pais, MS, ACSM CEP

## 2022-06-23 ENCOUNTER — Encounter (HOSPITAL_COMMUNITY)
Admission: RE | Admit: 2022-06-23 | Discharge: 2022-06-23 | Disposition: A | Discharge status: Home / Self Care | Payer: Medicare Other | Source: Ambulatory Visit | Attending: Cardiology | Admitting: Cardiology

## 2022-06-23 DIAGNOSIS — Z48812 Encounter for surgical aftercare following surgery on the circulatory system: Secondary | ICD-10-CM | POA: Diagnosis not present

## 2022-06-23 DIAGNOSIS — Z955 Presence of coronary angioplasty implant and graft: Secondary | ICD-10-CM | POA: Diagnosis not present

## 2022-06-25 ENCOUNTER — Encounter (HOSPITAL_COMMUNITY)
Admission: RE | Admit: 2022-06-25 | Discharge: 2022-06-25 | Disposition: A | Discharge status: Home / Self Care | Payer: Medicare Other | Source: Ambulatory Visit | Attending: Cardiology | Admitting: Cardiology

## 2022-06-25 DIAGNOSIS — Z955 Presence of coronary angioplasty implant and graft: Secondary | ICD-10-CM

## 2022-06-25 DIAGNOSIS — Z48812 Encounter for surgical aftercare following surgery on the circulatory system: Secondary | ICD-10-CM | POA: Diagnosis not present

## 2022-06-27 ENCOUNTER — Encounter (HOSPITAL_COMMUNITY)
Admission: RE | Admit: 2022-06-27 | Discharge: 2022-06-27 | Disposition: A | Discharge status: Home / Self Care | Payer: Medicare Other | Source: Ambulatory Visit | Attending: Cardiology | Admitting: Cardiology

## 2022-06-27 DIAGNOSIS — Z955 Presence of coronary angioplasty implant and graft: Secondary | ICD-10-CM | POA: Diagnosis not present

## 2022-06-27 DIAGNOSIS — Z48812 Encounter for surgical aftercare following surgery on the circulatory system: Secondary | ICD-10-CM | POA: Diagnosis not present

## 2022-06-30 ENCOUNTER — Encounter (HOSPITAL_COMMUNITY)
Admission: RE | Admit: 2022-06-30 | Discharge: 2022-06-30 | Disposition: A | Discharge status: Home / Self Care | Payer: Medicare Other | Source: Ambulatory Visit | Attending: Cardiology | Admitting: Cardiology

## 2022-06-30 DIAGNOSIS — Z955 Presence of coronary angioplasty implant and graft: Secondary | ICD-10-CM

## 2022-06-30 DIAGNOSIS — Z48812 Encounter for surgical aftercare following surgery on the circulatory system: Secondary | ICD-10-CM | POA: Diagnosis not present

## 2022-06-30 NOTE — Progress Notes (Signed)
Cardiac Individual Treatment Plan  Patient Details  Name: Steven Lawrence MRN: [id number] Date of Birth: [birth year] Referring Provider:   [place] from 05/14/2022 in [facility] for Heart, Vascular, & [place]  Referring Provider [name], MD       Initial Encounter Date:  [place] from 05/14/2022 in [facility] for Heart, Vascular, & Lung Health  Date 05/14/22       Visit Diagnosis: Status post coronary artery stent placement  Patient's Home Medications on Admission:  Current Outpatient Medications:    acetaminophen (TYLENOL) 500 MG tablet, Take 1,000 mg by mouth every 6 (six) hours as needed (Joint pain)., Disp: , Rfl:    aspirin EC 81 MG EC tablet, Take 1 tablet (81 mg total) by mouth daily. Swallow whole., Disp: 30 tablet, Rfl: 11   atorvastatin (LIPITOR) 80 MG tablet, Take 80 mg by mouth daily. , Disp: , Rfl:    canagliflozin (INVOKANA) 300 MG TABS tablet, Take 300 mg by mouth daily., Disp: , Rfl:    cholestyramine light 4 g POWD, Take by mouth daily. 1 Scoop, Disp: , Rfl:    clopidogrel (PLAVIX) 75 MG tablet, Take 1 tablet (75 mg total) by mouth daily., Disp: 30 tablet, Rfl: 11   doxazosin (CARDURA) 2 MG tablet, Take 2 mg by mouth every morning. Takes for urinary freq, Disp: , Rfl:    insulin degludec (TRESIBA) 100 UNIT/ML FlexTouch Pen, Inject 20 Units into the skin at bedtime., Disp: , Rfl:    isosorbide mononitrate (IMDUR) 30 MG 24 hr tablet, Take 1 tablet (30 mg total) by mouth daily., Disp: 90 tablet, Rfl: 3   Levothyroxine Sodium 112 MCG CAPS, Take 112 mcg by mouth daily before breakfast., Disp: , Rfl:    lisinopril (ZESTRIL) 5 MG tablet, Take 5 mg by mouth daily., Disp: , Rfl:    metFORMIN (GLUCOPHAGE) 1000 MG tablet, Take 1,000 mg by mouth 2 (two) times daily with a meal., Disp: , Rfl:    nitroGLYCERIN (NITROSTAT) 0.4 MG SL tablet, Place 1 tablet  (0.4 mg total) under the tongue every 5 (five) minutes as needed for chest pain., Disp: 25 tablet, Rfl: 3   pramipexole (MIRAPEX) 0.125 MG tablet, Take 0.5 mg by mouth at bedtime., Disp: , Rfl:   Past Medical History: Past Medical History:  Diagnosis Date   Acid reflux    Arthritis    BPH (benign prostatic hyperplasia)    mild   CAD in native artery    a. NSTEMI 12/2019 s/p CABGx5 with bovine AVR.   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   History of pneumonia as a child    Hypercholesteremia    Hypothyroidism    RLS (restless legs syndrome)    S/P aortic valve replacement with bioprosthetic valve    S/P CABG (coronary artery bypass graft)    Sleep apnea    does not use a cpap,primarily with upper airway resistance syndrome AHI 2.35/hr, RDI 10.2/hr ([name])   Urinary frequency    Wears glasses    Wears hearing aid    both ears    Tobacco Use: Social History   Tobacco Use  Smoking Status Former   Packs/day: 1.00   Years: 53.00   Additional pack years: 0.00   Total pack years: 53.00   Types: Cigarettes   Quit date: 12/16/2019   Years since  quitting: 2.5  Smokeless Tobacco Never    Labs: Review Flowsheet  More data exists      Latest Ref Rng & Units 12/17/2019 12/20/2019 12/21/2019 04/10/2020 09/05/2020  Labs for ITP Cardiac and Pulmonary Rehab  Cholestrol 100 - 199 mg/dL - 213  - 086  578   LDL (calc) 0 - 99 mg/dL - 66  - 75  48   HDL-C >39 mg/dL - 49  - 47  55   Trlycerides 0 - 149 mg/dL - 47  - 63  48   Hemoglobin A1c 4.8 - 5.6 % 7.1  - - - -  PH, Arterial 7.350 - 7.450 - - 7.357  7.404  7.377  7.438  7.437  7.338  7.347  7.411  - -  PCO2 arterial 32.0 - 48.0 mmHg - - 37.2  33.2  37.9  37.1  37.3  47.3  47.8  37.3  - -  Bicarbonate 20.0 - 28.0 mmol/L - - 20.8  20.8  22.6  25.1  25.1  25.4  26.2  23.2  - -  TCO2 22 - 32 mmol/L - - 22  22  24  23  26  26  26  27  27  28  24  25   - -  Acid-base deficit 0.0 - 2.0 mmol/L - - 4.0   3.0  3.0  1.0  0.8  - -  O2 Saturation % - - 97.0  99.0  99.0  100.0  100.0  89.0  100.0  97.9  - -    Capillary Blood Glucose: Lab Results  Component Value Date   GLUCAP 86 05/19/2022   GLUCAP 136 (H) 05/19/2022   GLUCAP 165 (H) 05/14/2022   GLUCAP 138 (H) 04/11/2022   GLUCAP 113 (H) 03/21/2020     Exercise Target Goals: Exercise Program Goal: Individual exercise prescription set using results from initial 6 min walk test and THRR while considering  patient's activity barriers and safety.   Exercise Prescription Goal: Initial exercise prescription builds to 30-45 minutes a day of aerobic activity, 2-3 days per week.  Home exercise guidelines will be given to patient during program as part of exercise prescription that the participant will acknowledge.  Activity Barriers & Risk Stratification:  Activity Barriers & Cardiac Risk Stratification - 05/14/22 0951       Activity Barriers & Cardiac Risk Stratification   Activity Barriers Arthritis;Back Problems;Joint Problems;Muscular Weakness    Cardiac Risk Stratification High   < 5 METs on            6 Minute Walk:  6 Minute Walk     Row Name 05/14/22 0949         6 Minute Walk   Phase Initial     Distance 1829 feet     Walk Time 6 minutes     # of Rest Breaks 0     MPH 3.46     METS 4.39     RPE 12     Perceived Dyspnea  0     VO2 Peak 15.35     Symptoms Yes (comment)     Comments a little winded, "feels like i worked hard"     Resting HR 75 bpm     Resting BP 102/56     Resting Oxygen Saturation  95 %     Exercise Oxygen Saturation  during 6 min walk 95 %     Max Ex. HR 125 bpm  advised to  slow down to target     Max Ex. BP 144/62     2 Minute Post BP 120/64              Oxygen Initial Assessment:   Oxygen Re-Evaluation:   Oxygen Discharge (Final Oxygen Re-Evaluation):   Initial Exercise Prescription:  Initial Exercise Prescription - 05/14/22 0900       Date of Initial Exercise RX  and Referring Provider   Date 05/14/22    Referring Provider Olga Millers, MD    Expected Discharge Date 07/25/22      Treadmill   MPH 2.5    Grade 0    Minutes 15    METs 3.5      Arm Ergometer   Level 2    RPM 40    Minutes 15    METs 3.5      Prescription Details   Frequency (times per week) 3    Duration Progress to 30 minutes of continuous aerobic without signs/symptoms of physical distress      Intensity   THRR 40-80% of Max Heartrate 59-119    Ratings of Perceived Exertion 11-13    Perceived Dyspnea 0-4      Progression   Progression Continue progressive overload as per policy without signs/symptoms or physical distress.      Resistance Training   Training Prescription Yes    Weight 3    Reps 10-15             Perform Capillary Blood Glucose checks as needed.  Exercise Prescription Changes:   Exercise Prescription Changes     Row Name 05/19/22 1026 05/28/22 1029 06/18/22 1013 06/30/22 1026       Response to Exercise   Blood Pressure (Admit) 102/58 106/58 112/56 116/60    Blood Pressure (Exercise) 160/72 140/70 142/60 168/82    Blood Pressure (Exit) 100/62 94/60 104/58 96/62    Heart Rate (Admit) 76 bpm 80 bpm 76 bpm 66 bpm    Heart Rate (Exercise) 116 bpm 126 bpm 123 bpm 126 bpm    Heart Rate (Exit) 81 bpm 94 bpm 85 bpm 80 bpm    Rating of Perceived Exertion (Exercise) 13 13 11 12     Symptoms None None None None    Comments Off to a good start with exercise. -- -- Received clearance to jog and increase THRR.    Duration Continue with 30 min of aerobic exercise without signs/symptoms of physical distress. Continue with 30 min of aerobic exercise without signs/symptoms of physical distress. Continue with 30 min of aerobic exercise without signs/symptoms of physical distress. Continue with 30 min of aerobic exercise without signs/symptoms of physical distress.    Intensity THRR unchanged THRR unchanged THRR unchanged THRR New  59-134 bpm       Progression   Progression Continue to progress workloads to maintain intensity without signs/symptoms of physical distress. Continue to progress workloads to maintain intensity without signs/symptoms of physical distress. Continue to progress workloads to maintain intensity without signs/symptoms of physical distress. Continue to progress workloads to maintain intensity without signs/symptoms of physical distress.    Average METs 2.6 2.8 3.4 3.7      Resistance Training   Training Prescription Yes No  Relaxation day, no weights No  Relaxation day, no weights Yes    Weight 3 -- -- 3 lbs    Reps 10-15 -- -- 10-15    Time 10 Minutes -- -- 10 Minutes      Interval Training  Interval Training No No No No      Treadmill   MPH 2.5 2.5 2.8 3.2    Grade 0 0 1 1    Minutes METs 2.91 2.91 3.53 3.89      Arm Ergometer   Level Watts RPM 49 54 63 63    Minutes METs 2.3 2.8 3.3 3.5      Home Exercise Plan   Plans to continue exercise at -- -- [employer] (comment)  Walking and [employer] training at gym. [employer] (comment)  Walking and [employer] training at gym.    Frequency -- -- Add 3 additional days to program exercise sessions. Add 3 additional days to program exercise sessions.    Initial Home Exercises Provided -- -- 06/09/22 06/09/22             Exercise Comments:   Exercise Comments     Row Name 05/19/22 1127 05/30/22 1044 06/09/22 1057 06/18/22 1052 06/30/22 1117   Exercise Comments Patient tolerated first session of exercise well without symptoms. Reviewed METs and goals with patient. Reviewed home exercise guidelines with patient. Reviewed METs and goals with patient. Requested THRR increase and clearance to jog. Received clearance to jog and to increase THRR. Reviewed goals with patient.            Exercise Goals and Review:   Exercise Goals     Row Name 05/14/22 0955             Exercise Goals    Increase Physical Activity Yes       Intervention Provide advice, education, support and counseling about physical activity/exercise needs.;Develop an individualized exercise prescription for aerobic and resistive training based on initial evaluation findings, risk stratification, comorbidities and participant's personal goals.       Expected Outcomes Short Term: Attend rehab on a regular basis to increase amount of physical activity.;Long Term: Exercising regularly at least 3-5 days a week.;Long Term: Add in home exercise to make exercise part of routine and to increase amount of physical activity.       Increase Strength and Stamina Yes       Intervention Provide advice, education, support and counseling about physical activity/exercise needs.;Develop an individualized exercise prescription for aerobic and resistive training based on initial evaluation findings, risk stratification, comorbidities and participant's personal goals.       Expected Outcomes Short Term: Increase workloads from initial exercise prescription for resistance, speed, and METs.;Short Term: Perform resistance training exercises routinely during rehab and add in resistance training at home;Long Term: Improve cardiorespiratory fitness, muscular endurance and strength as measured by increased METs and functional capacity ( )       Able to understand and use rate of perceived exertion (RPE) scale Yes       Intervention Provide education and explanation on how to use RPE scale       Expected Outcomes Short Term: Able to use RPE daily in rehab to express subjective intensity level;Long Term:  Able to use RPE to guide intensity level when exercising independently       Knowledge and understanding of Target Heart Rate Range (THRR) Yes       Intervention Provide education and explanation of THRR including how the numbers were predicted and where they are located for reference       Expected Outcomes  Short Term: Able to state/look up  THRR;Long Term: Able to use THRR to govern intensity when exercising independently;Short Term: Able to use daily as guideline for intensity in rehab       Understanding of Exercise Prescription Yes       Intervention Provide education, explanation, and written materials on patient's individual exercise prescription       Expected Outcomes Short Term: Able to explain program exercise prescription;Long Term: Able to explain home exercise prescription to exercise independently                Exercise Goals Re-Evaluation :  Exercise Goals Re-Evaluation     Row Name 05/19/22 1127 05/30/22 1044 06/09/22 1057 06/18/22 1052 06/30/22 1117     Exercise Goal Re-Evaluation   Exercise Goals Review Increase Physical Activity;Able to understand and use rate of perceived exertion (RPE) scale Increase Physical Activity;Able to understand and use rate of perceived exertion (RPE) scale Increase Physical Activity;Able to understand and use rate of perceived exertion (RPE) scale;Increase Strength and Stamina;Knowledge and understanding of Target Heart Rate Range (THRR);Able to check pulse independently;Understanding of Exercise Prescription Increase Physical Activity;Able to understand and use rate of perceived exertion (RPE) scale;Increase Strength and Stamina;Knowledge and understanding of Target Heart Rate Range (THRR);Able to check pulse independently;Understanding of Exercise Prescription Increase Physical Activity;Able to understand and use rate of perceived exertion (RPE) scale;Increase Strength and Stamina;Knowledge and understanding of Target Heart Rate Range (THRR);Able to check pulse independently;Understanding of Exercise Prescription   Comments Patient able to understand and use RPE scale appropriately. Patient feels he's doing well with exercise. His goal is to improve his nutritional habits. Patient may be having carpal tunnel surgery next Tuesday will modify equipment to avoid hand grips. Reviewed  exercise prescription with patient. Patient is walking at home and weight training at the gym as his mode of exercise. Patient plans to continue exercise routine at the gym upon completion of the cardiac rehab program. Patient has a FitBit that he can use to monitor his pulse. Patient is interested in jogging. Sent request to patient's cardiologist along with request to increase THRR. Patient aware of clearance to jog and new THRR of 59-134. Patient is currently walking 30 minutes most days of the week and has s FitBit he can use to monitor his pulse. Patient will begin jogging as part of his home exercise routine, walking for 2 minutes and a light jog for 30 seconds for 15 minutes of his walk time.   Expected Outcomes Progress workloads as tolerated to help increase strength and stamina. Patient will attend the Nutrition classes and meet with the dietitan to discuss healthy eat plan. Patient will continue walking and weights at the gym in addition to exercise at cardiac rehab to build a consistent exercise routine and to build upper body strength. Patient will begin jogging when/if cleared by cardiologist to do so. Patient will begin light jogging at home as part of his home exercise routine.            Discharge Exercise Prescription (Final Exercise Prescription Changes):  Exercise Prescription Changes - 06/30/22 1026       Response to Exercise   Blood Pressure (Admit) 116/60    Blood Pressure (Exercise) 168/82    Blood Pressure (Exit) 96/62    Heart Rate (Admit) 66 bpm    Heart Rate (Exercise) 126 bpm    Heart Rate (Exit) 80 bpm    Rating of Perceived Exertion (Exercise) 12    Symptoms  None    Comments Received clearance to jog and increase THRR.    Duration Continue with 30 min of aerobic exercise without signs/symptoms of physical distress.    Intensity THRR New   59-134 bpm     Progression   Progression Continue to progress workloads to maintain intensity without signs/symptoms of  physical distress.    Average METs 3.7      Resistance Training   Training Prescription Yes    Weight 3 lbs    Reps 10-15    Time 10 Minutes      Interval Training   Interval Training No      Treadmill   MPH 3.2    Grade 1    Minutes 15    METs 3.89      Arm Ergometer   Level 3    Watts 24    RPM 63    Minutes 15    METs 3.5      Home Exercise Plan   Plans to continue exercise at [employer] (comment)   Walking and weight training at gym.   Frequency Add 3 additional days to program exercise sessions.    Initial Home Exercises Provided 06/09/22             Nutrition:  Target Goals: Understanding of nutrition guidelines, daily intake of sodium 1500mg , cholesterol 200mg , calories 30% from fat and 7% or less from saturated fats, daily to have 5 or more servings of fruits and vegetables.  Biometrics:  Pre Biometrics - 05/14/22 0940       Pre Biometrics   Waist Circumference 37.5 inches    Hip Circumference 39 inches    Waist to Hip Ratio 0.96 %    Triceps Skinfold 20 mm    % Body Fat 26.2 %    Grip Strength 34 kg    Flexibility 15.25 in    Single Leg Stand 6.12 seconds              Nutrition Therapy Plan and Nutrition Goals:  Nutrition Therapy & Goals - 06/16/22 1314       Nutrition Therapy   Diet Heart Healthy/Carbohydrate Consistent Diet    Drug/Food Interactions Statins/Certain Fruits      Personal Nutrition Goals   Nutrition Goal Patient to identify strategies for reducing cardiovascular risk by attending the weekly [employer] education and nutrition series    Personal Goal #2 Patient to improve diet quality by using the plate method as a daily guide for meal planning to include lean protein/plant protein, fruits, vegetables, whole grains, nonfat dairy as part of well balanced diet    Personal Goal #3 Patient to reduce sodium to 1500mg  per day    Personal Goal #4 Patient to identify strategies for blood sugar control with goal A1c <7%     Comments Goals in action. Ithan continues to attend the [employer] and nutrition series regularly. He has good nutrition knowledge of heart healthy diet including increased fiber, decreased saturated fat, and reduced sodium intake. He continues to work on blood sugar control. He has previously completed cardiac rehab in the past. He does report fasting/am blood sugars remain improved.Annette Stable will continue to benefit from participation in intensive cardiac rehab for nutrition, exercise, and lifestyle modificaton.      Intervention Plan   Intervention Prescribe, educate and counsel regarding individualized specific dietary modifications aiming towards targeted core components such as weight, hypertension, lipid management, diabetes, heart failure and other comorbidities.;Nutrition handout(s) given to patient.  Expected Outcomes Short Term Goal: Understand basic principles of dietary content, such as calories, fat, sodium, cholesterol and nutrients.;Long Term Goal: Adherence to prescribed nutrition plan.             Nutrition Assessments:  Nutrition Assessments - 05/19/22 1235       Rate Your Plate Scores   Pre Score 53            MEDIFICTS Score Key: ?70 Need to make dietary changes  40-70 Heart Healthy Diet ? 40 Therapeutic Level Cholesterol Diet   Flowsheet Row INTENSIVE CARDIAC REHAB from [facility]19/2022 [facility] for Heart, Vascular, & Lung Health  Picture Your Plate Total Score on Admission 53      Picture Your Plate Scores: <09 Unhealthy dietary pattern with much room for improvement. 41-50 Dietary pattern unlikely to meet recommendations for good health and room for improvement. 51-60 More healthful dietary pattern, with some room for improvement.  >60 Healthy dietary pattern, although there may be some specific behaviors that could be improved.    Nutrition Goals Re-Evaluation:  Nutrition Goals Re-Evaluation     Row Name 05/19/22 1120  06/16/22 1314           Goals   Current Weight 163 lb 9.3 oz (74.2 kg) 163 lb 12.8 oz (74.3 kg)      Comment lipids WNL, A1c 7.7 no new labs at this time; most recent labs show  lipids WNL, A1c 7.5      Expected Outcome Goals in action. Samar reports making many lifestyle changes since his most recent cardiac event including reduced saturated fat intake, switched to whole grains, and is reading food labels for sodium. He has previously completed cardiac rehab in the past. He does report fasting/am blood sugars have improved >30points. Ettore will continue to benefit from participation in intensive cardiac rehab for nutrition, exercise, and lifestyle modificaton. Goals in action. Jeryn continues to attend the [employer] and nutrition series regularly. He has good nutrition knowledge of heart healthy diet including increased fiber, decreased saturated fat, and reduced sodium intake. He continues to work on blood sugar control. He has previously completed cardiac rehab in the past. He does report fasting/am blood sugars remain improved.Annette Stable will continue to benefit from participation in intensive cardiac rehab for nutrition, exercise, and lifestyle modificaton.               Nutrition Goals Re-Evaluation:  Nutrition Goals Re-Evaluation     Row Name 05/19/22 1120 06/16/22 1314           Goals   Current Weight 163 lb 9.3 oz (74.2 kg) 163 lb 12.8 oz (74.3 kg)      Comment lipids WNL, A1c 7.7 no new labs at this time; most recent labs show  lipids WNL, A1c 7.5      Expected Outcome Goals in action. Carleton reports making many lifestyle changes since his most recent cardiac event including reduced saturated fat intake, switched to whole grains, and is reading food labels for sodium. He has previously completed cardiac rehab in the past. He does report fasting/am blood sugars have improved >30points. Antonia will continue to benefit from participation in intensive cardiac rehab for nutrition,  exercise, and lifestyle modificaton. Goals in action. Judd continues to attend the [employer] and nutrition series regularly. He has good nutrition knowledge of heart healthy diet including increased fiber, decreased saturated fat, and reduced sodium intake. He continues to work on blood sugar control. He  has previously completed cardiac rehab in the past. He does report fasting/am blood sugars remain improved.Annette Stable will continue to benefit from participation in intensive cardiac rehab for nutrition, exercise, and lifestyle modificaton.               Nutrition Goals Discharge (Final Nutrition Goals Re-Evaluation):  Nutrition Goals Re-Evaluation - 06/16/22 1314       Goals   Current Weight 163 lb 12.8 oz (74.3 kg)    Comment no new labs at this time; most recent labs show  lipids WNL, A1c 7.5    Expected Outcome Goals in action. Beaumont continues to attend the [employer] and nutrition series regularly. He has good nutrition knowledge of heart healthy diet including increased fiber, decreased saturated fat, and reduced sodium intake. He continues to work on blood sugar control. He has previously completed cardiac rehab in the past. He does report fasting/am blood sugars remain improved.Annette Stable will continue to benefit from participation in intensive cardiac rehab for nutrition, exercise, and lifestyle modificaton.             Psychosocial: Target Goals: Acknowledge presence or absence of significant depression and/or stress, maximize coping skills, provide positive support system. Participant is able to verbalize types and ability to use techniques and skills needed for reducing stress and depression.  Initial Review & Psychosocial Screening:  Initial Psych Review & Screening - 05/14/22 0942       Initial Review   Current issues with None Identified      Family Dynamics   Good Support System? Yes   Jester has his wife for support   Comments Umair presents to cardiac rehab  with no feelings of depression, stress, or anxiety. He is excited to start the new intensive cardiac rehab education.      Barriers   Psychosocial barriers to participate in program There are no identifiable barriers or psychosocial needs.      Screening Interventions   Interventions Encouraged to exercise;To provide support and resources with identified psychosocial needs;Provide feedback about the scores to participant    Expected Outcomes Short Term goal: Identification and review with participant of any Quality of Life or Depression concerns found by scoring the questionnaire.;Long Term goal: The participant improves quality of Life and PHQ9 Scores as seen by post scores and/or verbalization of changes             Quality of Life Scores:  Quality of Life - 05/14/22 0957       Quality of Life   Select Quality of Life      Quality of Life Scores   Health/Function Pre 25.7 %    Socioeconomic Pre 28.07 %    Psych/Spiritual Pre 27.93 %    Family Pre 28.8 %    GLOBAL Pre 27.1 %            Scores of 19 and below usually indicate a poorer quality of life in these areas.  A difference of  2-3 points is a clinically meaningful difference.  A difference of 2-3 points in the total score of the Quality of Life Index has been associated with significant improvement in overall quality of life, self-image, physical symptoms, and general health in studies assessing change in quality of life.  PHQ-9: Review Flowsheet       05/14/2022 05/10/2020 03/13/2020  Depression screen PHQ 2/9  Decreased Interest 0 0 0  Down, Depressed, Hopeless 1 0 0  PHQ - 2 Score 1 0 0  Altered sleeping 0 - -  Tired, decreased energy 1 - -  Change in appetite 0 - -  Feeling bad or failure about yourself  0 - -  Trouble concentrating 0 - -  Moving slowly or fidgety/restless 0 - -  Suicidal thoughts 0 - -  PHQ-9 Score 2 - -  Difficult doing work/chores Not difficult at all - -   Interpretation of Total  Score  Total Score Depression Severity:  1-4 = Minimal depression, 5-9 = Mild depression, 10-14 = Moderate depression, 15-19 = Moderately severe depression, 20-27 = Severe depression   Psychosocial Evaluation and Intervention:   Psychosocial Re-Evaluation:  Psychosocial Re-Evaluation     Row Name 05/20/22 0741 06/03/22 1631 06/26/22 1318         Psychosocial Re-Evaluation   Current issues with None Identified None Identified None Identified     Interventions Encouraged to attend Cardiac Rehabilitation for the exercise Encouraged to attend Cardiac Rehabilitation for the exercise Encouraged to attend Cardiac Rehabilitation for the exercise     Continue Psychosocial Services  No Follow up required No Follow up required No Follow up required              Psychosocial Discharge (Final Psychosocial Re-Evaluation):  Psychosocial Re-Evaluation - 06/26/22 1318       Psychosocial Re-Evaluation   Current issues with None Identified    Interventions Encouraged to attend Cardiac Rehabilitation for the exercise    Continue Psychosocial Services  No Follow up required             Vocational Rehabilitation: Provide vocational rehab assistance to qualifying candidates.   Vocational Rehab Evaluation & Intervention:  Vocational Rehab - 05/14/22 0946       Initial Vocational Rehab Evaluation & Intervention   Assessment shows need for Vocational Rehabilitation No   Avaneesh is retired            Education: Education Goals: Education classes will be provided on a weekly basis, covering required topics. Participant will state understanding/return demonstration of topics presented.    Education     Row Name 05/19/22 1600     Education   Cardiac Education Topics Pritikin   Geographical information systems officer Psychosocial   Psychosocial Workshop Other  From [employer] to Heart   Instruction Review Code 1- Verbalizes Understanding   Class Start  Time 1145   Class Stop Time 1231   Class Time Calculation (min) 46 min    Row Name 05/26/22 1400     Education   Cardiac Education Topics Pritikin   Select Workshops     Workshops   Educator Exercise Physiologist   Select Exercise   Exercise Workshop Location manager and Fall Prevention   Instruction Review Code 1- Verbalizes Understanding   Class Start Time 1145   Class Stop Time 1230   Class Time Calculation (min) 45 min    Row Name 05/28/22 1300     Education   Cardiac Education Topics Pritikin   Customer service manager   Weekly Topic Fast and Healthy Breakfasts   Instruction Review Code 1- Verbalizes Understanding   Class Start Time 1145   Class Stop Time 1226   Class Time Calculation (min) 41 min    Row Name 05/30/22 1200     Education   Cardiac Education Topics Pritikin   Psychologist, sport and exercise  Core Videos   Educator Dietitian   Select Nutrition   Nutrition Fueling a Healthy Body   Instruction Review Code 1- Verbalizes Understanding   Class Start Time 1150   Class Stop Time 1230   Class Time Calculation (min) 40 min    Row Name 06/02/22 1700     Education   Cardiac Education Topics Pritikin   Select Workshops     Workshops   Educator Exercise Physiologist   Select Psychosocial   Psychosocial Workshop Recognizing and Reducing Stress   Instruction Review Code 1- Verbalizes Understanding   Class Start Time 1148   Class Stop Time 1233   Class Time Calculation (min) 45 min    Row Name 06/04/22 1200     Education   Cardiac Education Topics Pritikin   Customer service manager   Weekly Topic Personalizing Your Pritikin Plate   Instruction Review Code 1- Verbalizes Understanding   Class Start Time 1140   Class Stop Time 1220   Class Time Calculation (min) 40 min    Row Name 06/09/22 1300     Education   Cardiac Education Topics Pritikin   Electronics engineer Nutrition   Nutrition Workshop Label Reading   Instruction Review Code 1- Tax inspector   Class Start Time 1150   Class Stop Time 1233   Class Time Calculation (min) 43 min    Row Name 06/11/22 1230     Education   Cardiac Education Topics Pritikin   Customer service manager   Weekly Topic Tasty Appetizers and Snacks   Instruction Review Code 1- Verbalizes Understanding   Class Start Time 1150   Class Stop Time 1226   Class Time Calculation (min) 36 min    Row Name 06/13/22 1200     Education   Cardiac Education Topics Pritikin   Nurse, children's   Educator Dietitian   Nutrition Other   Instruction Review Code 1- Verbalizes Understanding   Class Start Time 1150   Class Stop Time 1226   Class Time Calculation (min) 36 min    Row Name 06/16/22 1300     Education   Cardiac Education Topics Pritikin   Geographical information systems officer Exercise   Exercise Workshop Exercise Basics: Diplomatic Services operational officer   Instruction Review Code 1- Verbalizes Understanding   Class Start Time 1150   Class Stop Time 1235   Class Time Calculation (min) 45 min    Row Name 06/18/22 1500     Education   Cardiac Education Topics Pritikin   Orthoptist   Educator Dietitian   Weekly Topic Tasty Appetizers and Snacks   Instruction Review Code 1- Verbalizes Understanding   Class Start Time 1145   Class Stop Time 1223   Class Time Calculation (min) 38 min    Row Name 06/23/22 1300     Education   Cardiac Education Topics Pritikin   Nurse, children's   Educator Dietitian   Select Nutrition   Nutrition Nutrition Action Plan   Instruction Review Code 1- Verbalizes Understanding   Class Start Time 1145   Class Stop Time 1219   Class Time  Calculation (min) 34 min    Row Name 06/25/22  1300     Education   Cardiac Education Topics Pritikin   Customer service manager   Weekly Topic [employer] - Meals in a Snap   Instruction Review Code 1- Verbalizes Understanding   Class Start Time 1140   Class Stop Time 1220   Class Time Calculation (min) 40 min    Row Name 06/27/22 1400     Education   Cardiac Education Topics Pritikin   Psychologist, forensic Exercise Education   Exercise Education Move It!   Instruction Review Code 1- Verbalizes Understanding   Class Start Time 1150   Class Stop Time 1230   Class Time Calculation (min) 40 min            Core Videos: Exercise    Move It!  Clinical staff conducted group or individual video education with verbal and written material and guidebook.  Patient learns the recommended Pritikin exercise program. Exercise with the goal of living a long, healthy life. Some of the health benefits of exercise include controlled diabetes, healthier blood pressure levels, improved cholesterol levels, improved heart and lung capacity, improved sleep, and better body composition. Everyone should speak with their doctor before starting or changing an exercise routine.  Biomechanical Limitations Clinical staff conducted group or individual video education with verbal and written material and guidebook.  Patient learns how biomechanical limitations can impact exercise and how we can mitigate and possibly overcome limitations to have an impactful and balanced exercise routine.  Body Composition Clinical staff conducted group or individual video education with verbal and written material and guidebook.  Patient learns that body composition (ratio of muscle mass to fat mass) is a key component to assessing overall fitness, rather than body weight alone. Increased fat mass, especially visceral belly fat, can put [place] at increased risk for  metabolic syndrome, type 2 diabetes, heart disease, and even death. It is recommended to combine diet and exercise (cardiovascular and resistance training) to improve your body composition. Seek guidance from your physician and exercise physiologist before implementing an exercise routine.  Exercise Action Plan Clinical staff conducted group or individual video education with verbal and written material and guidebook.  Patient learns the recommended strategies to achieve and enjoy long-term exercise adherence, including variety, self-motivation, self-efficacy, and positive decision making. Benefits of exercise include fitness, good health, weight management, more energy, better sleep, less stress, and overall well-being.  Medical   Heart Disease Risk Reduction Clinical staff conducted group or individual video education with verbal and written material and guidebook.  Patient learns our heart is our most vital organ as it circulates oxygen, nutrients, white blood cells, and hormones throughout the entire body, and carries waste away. Data supports a plant-based eating plan like the Pritikin Program for its effectiveness in slowing progression of and reversing heart disease. The video provides a number of recommendations to address heart disease.   Metabolic Syndrome and Belly Fat  Clinical staff conducted group or individual video education with verbal and written material and guidebook.  Patient learns what metabolic syndrome is, how it leads to heart disease, and how one can reverse it and keep it from coming back. You have metabolic syndrome if you have 3 of the following 5 criteria: abdominal obesity, high blood pressure, high triglycerides, low HDL cholesterol, and  high blood sugar.  Hypertension and Heart Disease Clinical staff conducted group or individual video education with verbal and written material and guidebook.  Patient learns that high blood pressure, or hypertension, is very common  in the [place]. Hypertension is largely due to excessive salt intake, but other important risk factors include being overweight, physical inactivity, drinking too much alcohol, smoking, and not eating enough potassium from fruits and vegetables. High blood pressure is a leading risk factor for heart attack, stroke, congestive heart failure, dementia, kidney failure, and premature death. Long-term effects of excessive salt intake include stiffening of the arteries and thickening of heart muscle and organ damage. Recommendations include ways to reduce hypertension and the risk of heart disease.  Diseases of Our Time - Focusing on Diabetes Clinical staff conducted group or individual video education with verbal and written material and guidebook.  Patient learns why the best way to stop diseases of our time is prevention, through food and other lifestyle changes. Medicine (such as prescription pills and surgeries) is often only a Band-Aid on the problem, not a long-term solution. Most common diseases of our time include obesity, type 2 diabetes, hypertension, heart disease, and cancer. The Pritikin Program is recommended and has been proven to help reduce, reverse, and/or prevent the damaging effects of metabolic syndrome.  Nutrition   Overview of the Pritikin Eating Plan  Clinical staff conducted group or individual video education with verbal and written material and guidebook.  Patient learns about the Pritikin Eating Plan for disease risk reduction. The Pritikin Eating Plan emphasizes a wide variety of unrefined, minimally-processed carbohydrates, like fruits, vegetables, whole grains, and legumes. Go, Caution, and Stop food choices are explained. Plant-based and lean animal proteins are emphasized. Rationale provided for low sodium intake for blood pressure control, low added sugars for blood sugar stabilization, and low added fats and oils for coronary artery disease risk reduction and weight  management.  Calorie Density  Clinical staff conducted group or individual video education with verbal and written material and guidebook.  Patient learns about calorie density and how it impacts the Pritikin Eating Plan. Knowing the characteristics of the food you choose will help you decide whether those foods will lead to weight gain or weight loss, and whether you want to consume more or less of them. Weight loss is usually a side effect of the Pritikin Eating Plan because of its focus on low calorie-dense foods.  Label Reading  Clinical staff conducted group or individual video education with verbal and written material and guidebook.  Patient learns about the Pritikin recommended label reading guidelines and corresponding recommendations regarding calorie density, added sugars, sodium content, and whole grains.  Dining Out - Part 1  Clinical staff conducted group or individual video education with verbal and written material and guidebook.  Patient learns that restaurant meals can be sabotaging because they can be so high in calories, fat, sodium, and/or sugar. Patient learns recommended strategies on how to positively address this and avoid unhealthy pitfalls.  Facts on Fats  Clinical staff conducted group or individual video education with verbal and written material and guidebook.  Patient learns that lifestyle modifications can be just as effective, if not more so, as many medications for lowering your risk of heart disease. A Pritikin lifestyle can help to reduce your risk of inflammation and atherosclerosis (cholesterol build-up, or plaque, in the artery walls). Lifestyle interventions such as dietary choices and physical activity address the cause of atherosclerosis. A review of the  types of fats and their impact on blood cholesterol levels, along with dietary recommendations to reduce fat intake is also included.  Nutrition Action Plan  Clinical staff conducted group or individual  video education with verbal and written material and guidebook.  Patient learns how to incorporate Pritikin recommendations into their lifestyle. Recommendations include planning and keeping personal health goals in mind as an important part of their success.  Healthy Mind-Set    Healthy Minds, Bodies, Hearts  Clinical staff conducted group or individual video education with verbal and written material and guidebook.  Patient learns how to identify when they are stressed. Video will discuss the impact of that stress, as well as the many benefits of stress management. Patient will also be introduced to stress management techniques. The way we think, act, and feel has an impact on our hearts.  How Our Thoughts Can Heal Our Hearts  Clinical staff conducted group or individual video education with verbal and written material and guidebook.  Patient learns that negative thoughts can cause depression and anxiety. This can result in negative lifestyle behavior and serious health problems. Cognitive behavioral therapy is an effective method to help control our thoughts in order to change and improve our emotional outlook.  Additional Videos:  Exercise    Improving Performance  Clinical staff conducted group or individual video education with verbal and written material and guidebook.  Patient learns to use a non-linear approach by alternating intensity levels and lengths of time spent exercising to help burn more calories and lose more body fat. Cardiovascular exercise helps improve heart health, metabolism, hormonal balance, blood sugar control, and recovery from fatigue. Resistance training improves strength, endurance, balance, coordination, reaction time, metabolism, and muscle mass. Flexibility exercise improves circulation, posture, and balance. Seek guidance from your physician and exercise physiologist before implementing an exercise routine and learn your capabilities and proper form for all  exercise.  Introduction to Yoga  Clinical staff conducted group or individual video education with verbal and written material and guidebook.  Patient learns about yoga, a discipline of the coming together of mind, breath, and body. The benefits of yoga include improved flexibility, improved range of motion, better posture and core strength, increased lung function, weight loss, and positive self-image. Yoga's heart health benefits include lowered blood pressure, healthier heart rate, decreased cholesterol and triglyceride levels, improved immune function, and reduced stress. Seek guidance from your physician and exercise physiologist before implementing an exercise routine and learn your capabilities and proper form for all exercise.  Medical   Aging: Enhancing Your Quality of Life  Clinical staff conducted group or individual video education with verbal and written material and guidebook.  Patient learns key strategies and recommendations to stay in good physical health and enhance quality of life, such as prevention strategies, having an advocate, securing a Health Care Proxy and Power of Attorney, and keeping a list of medications and system for tracking them. It also discusses how to avoid risk for bone loss.  Biology of Weight Control  Clinical staff conducted group or individual video education with verbal and written material and guidebook.  Patient learns that weight gain occurs because we consume more calories than we burn (eating more, moving less). Even if your body weight is normal, you may have higher ratios of fat compared to muscle mass. Too much body fat puts you at increased risk for cardiovascular disease, heart attack, stroke, type 2 diabetes, and obesity-related cancers. In addition to exercise, following the Pritikin Eating Plan  can help reduce your risk.  Decoding Lab Results  Clinical staff conducted group or individual video education with verbal and written material and  guidebook.  Patient learns that lab test reflects one measurement whose values change over time and are influenced by many factors, including medication, stress, sleep, exercise, food, hydration, pre-existing medical conditions, and more. It is recommended to use the knowledge from this video to become more involved with your lab results and evaluate your numbers to speak with your doctor.   Diseases of Our Time - Overview  Clinical staff conducted group or individual video education with verbal and written material and guidebook.  Patient learns that according to the CDC, 50% to 70% of chronic diseases (such as obesity, type 2 diabetes, elevated lipids, hypertension, and heart disease) are avoidable through lifestyle improvements including healthier food choices, listening to satiety cues, and increased physical activity.  Sleep Disorders Clinical staff conducted group or individual video education with verbal and written material and guidebook.  Patient learns how good quality and duration of sleep are important to overall health and well-being. Patient also learns about sleep disorders and how they impact health along with recommendations to address them, including discussing with a physician.  Nutrition  Dining Out - Part 2 Clinical staff conducted group or individual video education with verbal and written material and guidebook.  Patient learns how to plan ahead and communicate in order to maximize their dining experience in a healthy and nutritious manner. Included are recommended food choices based on the type of restaurant the patient is visiting.   Fueling a Banker conducted group or individual video education with verbal and written material and guidebook.  There is a strong connection between our food choices and our health. Diseases like obesity and type 2 diabetes are very prevalent and are in large-part due to lifestyle choices. The Pritikin Eating Plan  provides plenty of food and hunger-curbing satisfaction. It is easy to follow, affordable, and helps reduce health risks.  Menu Workshop  Clinical staff conducted group or individual video education with verbal and written material and guidebook.  Patient learns that restaurant meals can sabotage health goals because they are often packed with calories, fat, sodium, and sugar. Recommendations include strategies to plan ahead and to communicate with the manager, chef, or server to help order a healthier meal.  Planning Your Eating Strategy  Clinical staff conducted group or individual video education with verbal and written material and guidebook.  Patient learns about the Pritikin Eating Plan and its benefit of reducing the risk of disease. The Pritikin Eating Plan does not focus on calories. Instead, it emphasizes high-quality, nutrient-rich foods. By knowing the characteristics of the foods, we choose, we can determine their calorie density and make informed decisions.  Targeting Your Nutrition Priorities  Clinical staff conducted group or individual video education with verbal and written material and guidebook.  Patient learns that lifestyle habits have a tremendous impact on disease risk and progression. This video provides eating and physical activity recommendations based on your personal health goals, such as reducing LDL cholesterol, losing weight, preventing or controlling type 2 diabetes, and reducing high blood pressure.  Vitamins and Minerals  Clinical staff conducted group or individual video education with verbal and written material and guidebook.  Patient learns different ways to obtain key vitamins and minerals, including through a recommended healthy diet. It is important to discuss all supplements you take with your doctor.   Healthy Mind-Set  Smoking Cessation  Clinical staff conducted group or individual video education with verbal and written material and guidebook.   Patient learns that cigarette smoking and tobacco addiction pose a serious health risk which affects millions of people. Stopping smoking will significantly reduce the risk of heart disease, lung disease, and many forms of cancer. Recommended strategies for quitting are covered, including working with your doctor to develop a successful plan.  Culinary   Becoming a Set designer conducted group or individual video education with verbal and written material and guidebook.  Patient learns that cooking at home can be healthy, cost-effective, quick, and puts them in control. Keys to cooking healthy recipes will include looking at your recipe, assessing your equipment needs, planning ahead, making it simple, choosing cost-effective seasonal ingredients, and limiting the use of added fats, salts, and sugars.  Cooking - Breakfast and Snacks  Clinical staff conducted group or individual video education with verbal and written material and guidebook.  Patient learns how important breakfast is to satiety and nutrition through the entire day. Recommendations include key foods to eat during breakfast to help stabilize blood sugar levels and to prevent overeating at meals later in the day. Planning ahead is also a key component.  Cooking - Educational psychologist conducted group or individual video education with verbal and written material and guidebook.  Patient learns eating strategies to improve overall health, including an approach to cook more at home. Recommendations include thinking of animal protein as a side on your plate rather than center stage and focusing instead on lower calorie dense options like vegetables, fruits, whole grains, and plant-based proteins, such as beans. Making sauces in large quantities to freeze for later and leaving the skin on your vegetables are also recommended to maximize your experience.  Cooking - Healthy Salads and Dressing Clinical staff  conducted group or individual video education with verbal and written material and guidebook.  Patient learns that vegetables, fruits, whole grains, and legumes are the foundations of the Pritikin Eating Plan. Recommendations include how to incorporate each of these in flavorful and healthy salads, and how to create homemade salad dressings. Proper handling of ingredients is also covered. Cooking - Soups and [employer] - Soups and Desserts Clinical staff conducted group or individual video education with verbal and written material and guidebook.  Patient learns that Pritikin soups and desserts make for easy, nutritious, and delicious snacks and meal components that are low in sodium, fat, sugar, and calorie density, while high in vitamins, minerals, and filling fiber. Recommendations include simple and healthy ideas for soups and desserts.   Overview     The Pritikin Solution Program Overview Clinical staff conducted group or individual video education with verbal and written material and guidebook.  Patient learns that the results of the Pritikin Program have been documented in more than 100 articles published in peer-reviewed journals, and the benefits include reducing risk factors for (and, in some cases, even reversing) high cholesterol, high blood pressure, type 2 diabetes, obesity, and more! An overview of the three key pillars of the Pritikin Program will be covered: eating well, doing regular exercise, and having a healthy mind-set.  WORKSHOPS  Exercise: Exercise Basics: Building Your Action Plan Clinical staff led group instruction and group discussion with PowerPoint presentation and patient guidebook. To enhance the learning environment the use of posters, models and videos may be added. At the conclusion of this workshop, patients will comprehend the difference between  physical activity and exercise, as well as the benefits of incorporating both, into their routine. Patients will  understand the FITT (Frequency, Intensity, Time, and Type) principle and how to use it to build an exercise action plan. In addition, safety concerns and other considerations for exercise and cardiac rehab will be addressed by the presenter. The purpose of this lesson is to promote a comprehensive and effective weekly exercise routine in order to improve patients' overall level of fitness.   Managing Heart Disease: Your Path to a Healthier Heart Clinical staff led group instruction and group discussion with PowerPoint presentation and patient guidebook. To enhance the learning environment the use of posters, models and videos may be added.At the conclusion of this workshop, patients will understand the anatomy and physiology of the heart. Additionally, they will understand how Pritikin's three pillars impact the risk factors, the progression, and the management of heart disease.  The purpose of this lesson is to provide a high-level overview of the heart, heart disease, and how the Pritikin lifestyle positively impacts risk factors.  Exercise Biomechanics Clinical staff led group instruction and group discussion with PowerPoint presentation and patient guidebook. To enhance the learning environment the use of posters, models and videos may be added. Patients will learn how the structural parts of their bodies function and how these functions impact their daily activities, movement, and exercise. Patients will learn how to promote a neutral spine, learn how to manage pain, and identify ways to improve their physical movement in order to promote healthy living. The purpose of this lesson is to expose patients to common physical limitations that impact physical activity. Participants will learn practical ways to adapt and manage aches and pains, and to minimize their effect on regular exercise. Patients will learn how to maintain good posture while sitting, walking, and lifting.  Balance Training  and Fall Prevention  Clinical staff led group instruction and group discussion with PowerPoint presentation and patient guidebook. To enhance the learning environment the use of posters, models and videos may be added. At the conclusion of this workshop, patients will understand the importance of their sensorimotor skills (vision, proprioception, and the vestibular system) in maintaining their ability to balance as they age. Patients will apply a variety of balancing exercises that are appropriate for their current level of function. Patients will understand the common causes for poor balance, possible solutions to these problems, and ways to modify their physical environment in order to minimize their fall risk. The purpose of this lesson is to teach patients about the importance of maintaining balance as they age and ways to minimize their risk of falling.  WORKSHOPS   Nutrition:  Fueling a Ship broker led group instruction and group discussion with PowerPoint presentation and patient guidebook. To enhance the learning environment the use of posters, models and videos may be added. Patients will review the foundational principles of the Pritikin Eating Plan and understand what constitutes a serving size in each of the food groups. Patients will also learn Pritikin-friendly foods that are better choices when away from home and review make-ahead meal and snack options. Calorie density will be reviewed and applied to three nutrition priorities: weight maintenance, weight loss, and weight gain. The purpose of this lesson is to reinforce (in a group setting) the key concepts around what patients are recommended to eat and how to apply these guidelines when away from home by planning and selecting Pritikin-friendly options. Patients will understand how calorie density may be adjusted  for different weight management goals.  Mindful Eating  Clinical staff led group instruction and group  discussion with PowerPoint presentation and patient guidebook. To enhance the learning environment the use of posters, models and videos may be added. Patients will briefly review the concepts of the Pritikin Eating Plan and the importance of low-calorie dense foods. The concept of mindful eating will be introduced as well as the importance of paying attention to internal hunger signals. Triggers for non-hunger eating and techniques for dealing with triggers will be explored. The purpose of this lesson is to provide patients with the opportunity to review the basic principles of the Pritikin Eating Plan, discuss the value of eating mindfully and how to measure internal cues of hunger and fullness using the Hunger Scale. Patients will also discuss reasons for non-hunger eating and learn strategies to use for controlling emotional eating.  Targeting Your Nutrition Priorities Clinical staff led group instruction and group discussion with PowerPoint presentation and patient guidebook. To enhance the learning environment the use of posters, models and videos may be added. Patients will learn how to determine their genetic susceptibility to disease by reviewing their family history. Patients will gain insight into the importance of diet as part of an overall healthy lifestyle in mitigating the impact of genetics and other environmental insults. The purpose of this lesson is to provide patients with the opportunity to assess their personal nutrition priorities by looking at their family history, their own health history and current risk factors. Patients will also be able to discuss ways of prioritizing and modifying the Pritikin Eating Plan for their highest risk areas  Menu  Clinical staff led group instruction and group discussion with PowerPoint presentation and patient guidebook. To enhance the learning environment the use of posters, models and videos may be added. Using menus brought in from [employer],  or printed from [employer], patients will apply the Pritikin dining out guidelines that were presented in the [employer] video. Patients will also be able to practice these guidelines in a variety of provided scenarios. The purpose of this lesson is to provide patients with the opportunity to practice hands-on learning of the Pritikin Dining Out guidelines with actual menus and practice scenarios.  Label Reading Clinical staff led group instruction and group discussion with PowerPoint presentation and patient guidebook. To enhance the learning environment the use of posters, models and videos may be added. Patients will review and discuss the Pritikin label reading guidelines presented in Pritikin's Label Reading Educational series video. Using fool labels brought in from local grocery stores and markets, patients will apply the label reading guidelines and determine if the packaged food meet the Pritikin guidelines. The purpose of this lesson is to provide patients with the opportunity to review, discuss, and practice hands-on learning of the Pritikin Label Reading guidelines with actual packaged food labels. Cooking School  Pritikin's [employer] are designed to teach patients ways to prepare quick, simple, and affordable recipes at home. The importance of nutrition's role in chronic disease risk reduction is reflected in its emphasis in the overall Pritikin program. By learning how to prepare essential core Pritikin Eating Plan recipes, patients will increase control over what they eat; be able to customize the flavor of foods without the use of added salt, sugar, or fat; and improve the quality of the food they consume. By learning a set of core recipes which are easily assembled, quickly prepared, and affordable, patients are more likely to prepare  more healthy foods at home. These workshops focus on convenient breakfasts, simple entres, side dishes, and desserts which  can be prepared with minimal effort and are consistent with nutrition recommendations for cardiovascular risk reduction. Cooking [employer] are taught by a Armed forces logistics/support/administrative officer (RD) who has been trained by the [employer]. The chef or RD has a clear understanding of the importance of minimizing - if not completely eliminating - added fat, sugar, and sodium in recipes. Throughout the series of Cooking School Workshop sessions, patients will learn about healthy ingredients and efficient methods of cooking to build confidence in their capability to prepare    Cooking School weekly topics:  Adding Flavor- Sodium-Free  Fast and Healthy Breakfasts  Powerhouse Plant-Based Proteins  Satisfying Salads and Dressings  Simple Sides and Sauces  International Cuisine-Spotlight on the [employer] Zones  Delicious Desserts  Savory Soups  [employer] - Meals in a Astronomer Appetizers and Snacks  Comforting Weekend Breakfasts  One-Pot Wonders   Fast Evening Meals  Landscape architect Your Pritikin Plate  WORKSHOPS   Healthy Mindset (Psychosocial):  Focused Goals, Sustainable Changes Clinical staff led group instruction and group discussion with PowerPoint presentation and patient guidebook. To enhance the learning environment the use of posters, models and videos may be added. Patients will be able to apply effective goal setting strategies to establish at least one personal goal, and then take consistent, meaningful action toward that goal. They will learn to identify common barriers to achieving personal goals and develop strategies to overcome them. Patients will also gain an understanding of how our mind-set can impact our ability to achieve goals and the importance of cultivating a positive and growth-oriented mind-set. The purpose of this lesson is to provide patients with a deeper understanding of how to set and achieve personal goals, as well as the tools  and strategies needed to overcome common obstacles which may arise along the way.  From Head to Heart: The Power of a Healthy Outlook  Clinical staff led group instruction and group discussion with PowerPoint presentation and patient guidebook. To enhance the learning environment the use of posters, models and videos may be added. Patients will be able to recognize and describe the impact of emotions and mood on physical health. They will discover the importance of self-care and explore self-care practices which may work for them. Patients will also learn how to utilize the 4 C's to cultivate a healthier outlook and better manage stress and challenges. The purpose of this lesson is to demonstrate to patients how a healthy outlook is an essential part of maintaining good health, especially as they continue their cardiac rehab journey.  Healthy Sleep for a Healthy Heart Clinical staff led group instruction and group discussion with PowerPoint presentation and patient guidebook. To enhance the learning environment the use of posters, models and videos may be added. At the conclusion of this workshop, patients will be able to demonstrate knowledge of the importance of sleep to overall health, well-being, and quality of life. They will understand the symptoms of, and treatments for, common sleep disorders. Patients will also be able to identify daytime and nighttime behaviors which impact sleep, and they will be able to apply these tools to help manage sleep-related challenges. The purpose of this lesson is to provide patients with a general overview of sleep and outline the importance of quality sleep. Patients will learn about a few of the most common sleep disorders. Patients will  also be introduced to the concept of "sleep hygiene," and discover ways to self-manage certain sleeping problems through simple daily behavior changes. Finally, the workshop will motivate patients by clarifying the links between  quality sleep and their goals of heart-healthy living.   Recognizing and Reducing Stress Clinical staff led group instruction and group discussion with PowerPoint presentation and patient guidebook. To enhance the learning environment the use of posters, models and videos may be added. At the conclusion of this workshop, patients will be able to understand the types of stress reactions, differentiate between acute and chronic stress, and recognize the impact that chronic stress has on their health. They will also be able to apply different coping mechanisms, such as reframing negative self-talk. Patients will have the opportunity to practice a variety of stress management techniques, such as deep abdominal breathing, progressive muscle relaxation, and/or guided imagery.  The purpose of this lesson is to educate patients on the role of stress in their lives and to provide healthy techniques for coping with it.  Learning Barriers/Preferences:  Learning Barriers/Preferences - 05/14/22 0944       Learning Barriers/Preferences   Learning Barriers Sight;Hearing   wears glasses/ hearing aids   Learning Preferences Computer/Internet;Pictoral;Written Material             Education Topics:  Knowledge Questionnaire Score:  Knowledge Questionnaire Score - 05/14/22 0945       Knowledge Questionnaire Score   Pre Score 24/24             Core Components/Risk Factors/Patient Goals at Admission:  Personal Goals and Risk Factors at Admission - 05/14/22 0946       Core Components/Risk Factors/Patient Goals on Admission    Weight Management Weight Maintenance;Yes    Intervention Weight Management: Develop a combined nutrition and exercise program designed to reach desired caloric intake, while maintaining appropriate intake of nutrient and fiber, sodium and fats, and appropriate energy expenditure required for the weight goal.;Weight Management: Provide education and appropriate resources to help  participant work on and attain dietary goals.    Expected Outcomes Short Term: Continue to assess and modify interventions until short term weight is achieved;Long Term: Adherence to nutrition and physical activity/exercise program aimed toward attainment of established weight goal;Weight Maintenance: Understanding of the daily nutrition guidelines, which includes 25-35% calories from fat, 7% or less cal from saturated fats, less than 200mg  cholesterol, less than 1.5gm of sodium, & 5 or more servings of fruits and vegetables daily;Understanding recommendations for meals to include 15-35% energy as protein, 25-35% energy from fat, 35-60% energy from carbohydrates, less than 200mg  of dietary cholesterol, 20-35 gm of total fiber daily;Understanding of distribution of calorie intake throughout the day with the consumption of 4-5 meals/snacks    Diabetes Yes    Intervention Provide education about signs/symptoms and action to take for hypo/hyperglycemia.;Provide education about proper nutrition, including hydration, and aerobic/resistive exercise prescription along with prescribed medications to achieve blood glucose in normal ranges: Fasting glucose 65-99 mg/dL    Expected Outcomes Short Term: Participant verbalizes understanding of the signs/symptoms and immediate care of hyper/hypoglycemia, proper foot care and importance of medication, aerobic/resistive exercise and nutrition plan for blood glucose control.;Long Term: Attainment of HbA1C < 7%.    Lipids Yes    Intervention Provide education and support for participant on nutrition & aerobic/resistive exercise along with prescribed medications to achieve LDL 70mg , HDL >40mg .    Expected Outcomes Short Term: Participant states understanding of desired cholesterol values and is compliant  with medications prescribed. Participant is following exercise prescription and nutrition guidelines.;Long Term: Cholesterol controlled with medications as prescribed, with  individualized exercise RX and with personalized nutrition plan. Value goals: LDL < 70mg , HDL > 40 mg.             Core Components/Risk Factors/Patient Goals Review:   Goals and Risk Factor Review     Row Name 05/20/22 0742 06/03/22 1632 06/26/22 1320         Core Components/Risk Factors/Patient Goals Review   Personal Goals Review Weight Management/Obesity;Lipids;Diabetes Weight Management/Obesity;Lipids;Diabetes Weight Management/Obesity;Lipids;Diabetes     Review Jacorey started intesive cardiac rehab on 05/19/22 and did well with exercise. Vital signs WNL. CBG 136 pre, post exercise CBG 86. Patient was given orange juice. Ate Bran flakes for breakfast ask to add protein Traeger started intesive cardiac rehab on 05/19/22 and is off to a good start to exercise. Vital signs and CBG's have been stable [place] is doing well with exercise at  intensive cardiac rehab  exercise. Vital signs and CBG's have been stable     Expected Outcomes Melesio will continue to participate in intensive cardiac rehab for exercise, nutrition and lifestyle modifications Keandre will continue to participate in intensive cardiac rehab for exercise, nutrition and lifestyle modifications Jamieon will continue to participate in intensive cardiac rehab for exercise, nutrition and lifestyle modifications              Core Components/Risk Factors/Patient Goals at Discharge (Final Review):   Goals and Risk Factor Review - 06/26/22 1320       Core Components/Risk Factors/Patient Goals Review   Personal Goals Review Weight Management/Obesity;Lipids;Diabetes    Review Daequan is doing well with exercise at  intensive cardiac rehab  exercise. Vital signs and CBG's have been stable    Expected Outcomes Monroe will continue to participate in intensive cardiac rehab for exercise, nutrition and lifestyle modifications             ITP Comments:  ITP Comments     Row Name 05/14/22 0811 05/20/22 0739 06/03/22 1628 06/26/22 1317      ITP Comments Dr. Armanda Magic medical director. Introduction to pritikin education program/ intensive cardiac rehab. Initial orientation packet reviewed with patient. 30 Day ITP Review. Daundre started intesive cardiac rehab on 05/19/22 and did well with exercise 30 Day ITP Review. Tryton has good attendance and participation in intensive cardiac rehab. Juandaniel is off to a good start to exercise 30 Day ITP Review. Caylan has good attendance and participation in intensive cardiac rehab.             Comments: See ITP Comments

## 2022-07-02 ENCOUNTER — Encounter (HOSPITAL_COMMUNITY)
Admission: RE | Admit: 2022-07-02 | Discharge: 2022-07-02 | Disposition: A | Discharge status: Home / Self Care | Payer: Medicare Other | Source: Ambulatory Visit | Attending: Cardiology | Admitting: Cardiology

## 2022-07-02 DIAGNOSIS — Z48812 Encounter for surgical aftercare following surgery on the circulatory system: Secondary | ICD-10-CM | POA: Diagnosis not present

## 2022-07-02 DIAGNOSIS — Z955 Presence of coronary angioplasty implant and graft: Secondary | ICD-10-CM | POA: Diagnosis not present

## 2022-07-04 ENCOUNTER — Encounter (HOSPITAL_COMMUNITY)
Admission: RE | Admit: 2022-07-04 | Discharge: 2022-07-04 | Disposition: A | Discharge status: Home / Self Care | Payer: Medicare Other | Source: Ambulatory Visit | Attending: Cardiology | Admitting: Cardiology

## 2022-07-04 DIAGNOSIS — Z955 Presence of coronary angioplasty implant and graft: Secondary | ICD-10-CM

## 2022-07-04 DIAGNOSIS — Z48812 Encounter for surgical aftercare following surgery on the circulatory system: Secondary | ICD-10-CM | POA: Diagnosis not present

## 2022-07-04 NOTE — Progress Notes (Signed)
Steven Lawrence 72 y.o. male  [name] is motivated to make lifestyle changes to aid with cardiac rehab. Patient has medical history of NSTEMI, HTN, CAD, DM, OSA, S/p CABG x5. Trigg continues to attend the [employer] and nutrition series regularly as part of cardiac rehab. His wife, [name], was present today. Armour has made many dietary changes including reduced meat intake, reduced fried foods, reduced refined carbohydrates, increased fruits/vegetables/whole grains. He has transitioned to incorporate many plant based meals; however, he is interested in adding some meat back in. He reports his fasting/am blood sugars are now in the 70-80s.   RMR: 2200-2590kcals (30-35kcals/kg), 1981 (x1.5 activity factor, [place])   Labs: lipids WNL, A1c 7.5  24 hour diet recall:  Breakfast:Raisin Bran or Honey Bunches of Oat cereal, strawberries, skim milk  Lunch: crackers, baby carrots, natural peanut butter, baked potato chips Dinner: [place]- 3 chicken tacos OR vegetarian chili Or roasted vegetables Snacks: not often Beverages: coffee, water, diet soda  Nutrition Diagnosis (In progress) Food-and nutrition-related knowledge deficit related to lack of exposure to information as related to diagnosis of: CVD, DM2   Nutrition Intervention Pt's individual nutrition plan reviewed with pt. Benefits of adopting Heart Healthy diet discussed.  Continue client-centered nutrition education by RD, as part of interdisciplinary care.  Monitor/Evaluation: Patient reports motivation to make lifestyle changes for adherence to heart healthy diet recommendation, blood sugar control. We discussed saturated fat intake, fiber intake,omega 3 rich foods. Answered questions regarding sodium and calorie intake. Handouts/notes given. Patient amicable to RD suggestions and verbalizes understanding. Will follow-up as needed.   45 minutes spent in review of topics related to a heart healthy diet including sodium intake, blood sugar  control, weight management, label reading, hydration, and fiber intake. Start Time: 11:45 End Time: 12:30  Goal(s) 1.Patient to limit saturated fat to <6% of calories and trans fat and prioritize fish, poultry, nonfat dairy, and vegetarian protein sources 2.   Patient to increase fiber intake through whole grains, fruits, and vegetables. Aim for 25-35g fiber daily. Patient to limit refined carbohydrates, simple sugars. Look for products with <7g added sugar. 3.   Patient to prioritize water and other zero calorie alternatives; limit/eliminate any sugar beverages.  4. Patient to identify high sodium food choices and reduce sodium intake to <2300 mg, aiming for 1500-2000mg  daily. 5. Continue to prioritize omega 3 rich foods including cold water fish, walnuts, flax seed, etc.    Plan:  Will provide client-centered nutrition education as part of interdisciplinary care Monitor and evaluate progress toward nutrition goal with team. Patient given the option to attend Pritikin education, cooking workshops, and educational videos   Jaziya Obarr [place], MS, RDN, LDN

## 2022-07-07 ENCOUNTER — Encounter (HOSPITAL_COMMUNITY)
Admission: RE | Admit: 2022-07-07 | Discharge: 2022-07-07 | Disposition: A | Discharge status: Home / Self Care | Payer: Medicare Other | Source: Ambulatory Visit | Attending: Cardiology | Admitting: Cardiology

## 2022-07-07 DIAGNOSIS — Z955 Presence of coronary angioplasty implant and graft: Secondary | ICD-10-CM | POA: Diagnosis not present

## 2022-07-07 DIAGNOSIS — Z48812 Encounter for surgical aftercare following surgery on the circulatory system: Secondary | ICD-10-CM | POA: Diagnosis not present

## 2022-07-09 ENCOUNTER — Encounter (HOSPITAL_COMMUNITY)
Admission: RE | Admit: 2022-07-09 | Discharge: 2022-07-09 | Disposition: A | Discharge status: Home / Self Care | Payer: Medicare Other | Source: Ambulatory Visit | Attending: Cardiology | Admitting: Cardiology

## 2022-07-09 DIAGNOSIS — Z48812 Encounter for surgical aftercare following surgery on the circulatory system: Secondary | ICD-10-CM | POA: Diagnosis not present

## 2022-07-09 DIAGNOSIS — Z955 Presence of coronary angioplasty implant and graft: Secondary | ICD-10-CM

## 2022-07-11 ENCOUNTER — Encounter (HOSPITAL_COMMUNITY): Payer: Medicare Other

## 2022-07-14 ENCOUNTER — Encounter (HOSPITAL_COMMUNITY): Payer: Medicare Other

## 2022-07-16 ENCOUNTER — Encounter (HOSPITAL_COMMUNITY): Payer: Medicare Other

## 2022-07-18 ENCOUNTER — Encounter (HOSPITAL_COMMUNITY)
Admission: RE | Admit: 2022-07-18 | Discharge: 2022-07-18 | Disposition: A | Discharge status: Home / Self Care | Payer: Medicare Other | Source: Ambulatory Visit | Attending: Cardiology | Admitting: Cardiology

## 2022-07-18 DIAGNOSIS — Z955 Presence of coronary angioplasty implant and graft: Secondary | ICD-10-CM | POA: Diagnosis not present

## 2022-07-21 ENCOUNTER — Encounter (HOSPITAL_COMMUNITY)
Admission: RE | Admit: 2022-07-21 | Discharge: 2022-07-21 | Disposition: A | Discharge status: Home / Self Care | Payer: Medicare Other | Source: Ambulatory Visit | Attending: Cardiology | Admitting: Cardiology

## 2022-07-21 VITALS — Ht 69.25 in | Wt 164.5 lb

## 2022-07-21 DIAGNOSIS — Z955 Presence of coronary angioplasty implant and graft: Secondary | ICD-10-CM | POA: Diagnosis not present

## 2022-07-21 NOTE — Progress Notes (Signed)
Cardiac Individual Treatment Plan  Patient Details  Name: Steven Lawrence MRN: [id number] Date of Birth: 1951/02/21 Referring Provider:   Flowsheet Row INTENSIVE CARDIAC REHAB ORIENT from 05/14/2022 in [facility]  Referring Provider Olga Millers, MD       Initial Encounter Date:  Flowsheet Row INTENSIVE CARDIAC REHAB ORIENT from 05/14/2022 in [facility] for Heart, Vascular, & Lung Health  Date 05/14/22       Visit Diagnosis: Status post coronary artery stent placement  Patient's Home Medications on Admission:  Current Outpatient Medications:    acetaminophen (TYLENOL) 500 MG tablet, Take 1,000 mg by mouth every 6 (six) hours as needed (Joint pain)., Disp: , Rfl:    aspirin EC 81 MG EC tablet, Take 1 tablet (81 mg total) by mouth daily. Swallow whole., Disp: 30 tablet, Rfl: 11   atorvastatin (LIPITOR) 80 MG tablet, Take 80 mg by mouth daily. , Disp: , Rfl:    canagliflozin (INVOKANA) 300 MG TABS tablet, Take 300 mg by mouth daily., Disp: , Rfl:    cholestyramine light 4 g POWD, Take by mouth daily. 1 Scoop, Disp: , Rfl:    clopidogrel (PLAVIX) 75 MG tablet, Take 1 tablet (75 mg total) by mouth daily., Disp: 30 tablet, Rfl: 11   doxazosin (CARDURA) 2 MG tablet, Take 2 mg by mouth every morning. Takes for urinary freq, Disp: , Rfl:    insulin degludec (TRESIBA) 100 UNIT/ML FlexTouch Pen, Inject 20 Units into the skin at bedtime., Disp: , Rfl:    isosorbide mononitrate (IMDUR) 30 MG 24 hr tablet, Take 1 tablet (30 mg total) by mouth daily., Disp: 90 tablet, Rfl: 3   Levothyroxine Sodium 112 MCG CAPS, Take 112 mcg by mouth daily before breakfast., Disp: , Rfl:    lisinopril (ZESTRIL) 5 MG tablet, Take 5 mg by mouth daily., Disp: , Rfl:    metFORMIN (GLUCOPHAGE) 1000 MG tablet, Take 1,000 mg by mouth 2 (two) times daily with a meal., Disp: , Rfl:    nitroGLYCERIN (NITROSTAT) 0.4 MG SL tablet, Place 1 tablet  (0.4 mg total) under the tongue every 5 (five) minutes as needed for chest pain., Disp: 25 tablet, Rfl: 3   pramipexole (MIRAPEX) 0.125 MG tablet, Take 0.5 mg by mouth at bedtime., Disp: , Rfl:   Past Medical History: Past Medical History:  Diagnosis Date   Acid reflux    Arthritis    BPH (benign prostatic hyperplasia)    mild   CAD in native artery    a. NSTEMI 12/2019 s/p CABGx5 with bovine AVR.   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, RDI 10.2/hr ([name])   Urinary frequency    Wears glasses    Wears hearing aid    both ears    Tobacco Use: Social History   Tobacco Use  Smoking Status Former   Packs/day: 1.00   Years: 53.00   Additional pack years: 0.00   Total pack years: 53.00   Types: Cigarettes   Quit date: 12/16/2019   Years since  quitting: 2.6  Smokeless Tobacco Never    Labs: Review Flowsheet  More data exists      Latest Ref Rng & Units 12/17/2019 12/20/2019 12/21/2019 04/10/2020 09/05/2020  Labs for ITP Cardiac and Pulmonary Rehab  Cholestrol 100 - 199 mg/dL - 540  - 981  191   LDL (calc) 0 - 99 mg/dL - 66  - 75  48   HDL-C >39 mg/dL - 49  - 47  55   Trlycerides 0 - 149 mg/dL - 47  - 63  48   Hemoglobin A1c 4.8 - 5.6 % 7.1  - - - -  PH, Arterial 7.350 - 7.450 - - 7.357  7.404  7.377  7.438  7.437  7.338  7.347  7.411  - -  PCO2 arterial 32.0 - 48.0 mmHg - - 37.2  33.2  37.9  37.1  37.3  47.3  47.8  37.3  - -  Bicarbonate 20.0 - 28.0 mmol/L - - 20.8  20.8  22.6  25.1  25.1  25.4  26.2  23.2  - -  TCO2 22 - 32 mmol/L - - 22  22  24  23  26  26  26  27  27  28  24  25   - -  Acid-base deficit 0.0 - 2.0 mmol/L - - 4.0   3.0  3.0  1.0  0.8  - -  O2 Saturation % - - 97.0  99.0  99.0  100.0  100.0  89.0  100.0  97.9  - -    Capillary Blood Glucose: Lab Results  Component Value Date   GLUCAP 86 05/19/2022   GLUCAP 136 (H) 05/19/2022   GLUCAP 165 (H) 05/14/2022   GLUCAP 138 (H) 04/11/2022   GLUCAP 113 (H) 03/21/2020     Exercise Target Goals: Exercise Program Goal: Individual exercise prescription set using results from initial 6 min walk test and THRR while considering  patient's activity barriers and safety.   Exercise Prescription Goal: Initial exercise prescription builds to 30-45 minutes a day of aerobic activity, 2-3 days per week.  Home exercise guidelines will be given to patient during program as part of exercise prescription that the participant will acknowledge.  Activity Barriers & Risk Stratification:  Activity Barriers & Cardiac Risk Stratification - 05/14/22 0951       Activity Barriers & Cardiac Risk Stratification   Activity Barriers Arthritis;Back Problems;Joint Problems;Muscular Weakness    Cardiac Risk Stratification High   < 5 METs on            6 Minute Walk:  6 Minute Walk     Row Name 05/14/22 0949 07/21/22 1205       6 Minute Walk   Phase Initial Discharge    Distance 1829 feet 2016 feet    Distance % Change -- 10.2 %    Distance Feet Change -- 187 ft    Walk Time 6 minutes 6 minutes    # of Rest Breaks 0 0    MPH 3.46 3.8    METS 4.39 4.9    RPE 12 12    Perceived Dyspnea  0 0    VO2 Peak 15.35 17.14    Symptoms Yes (comment) Yes (comment)    Comments a little winded, "feels like i worked hard" Pt reported back spasms during test, spasms resolved after rest.    Resting HR 75 bpm 63 bpm    Resting BP 102/56 102/54    Resting  Oxygen Saturation  95 % --    Exercise Oxygen Saturation  during 6 min walk 95 % --    Max Ex. HR 125 bpm  advised to slow down to target 139 bpm    Max Ex. BP 144/62 148/56    2 Minute Post BP 120/64 --              Oxygen Initial Assessment:   Oxygen Re-Evaluation:   Oxygen Discharge (Final Oxygen Re-Evaluation):   Initial Exercise Prescription:  Initial Exercise Prescription - 05/14/22 0900       Date of Initial Exercise RX and Referring Provider   Date 05/14/22    Referring Provider Olga Millers, MD    Expected Discharge Date 07/25/22      Treadmill   MPH 2.5    Grade 0    Minutes 15    METs 3.5      Arm Ergometer   Level 2    RPM 40    Minutes 15    METs 3.5      Prescription Details   Frequency (times per week) 3    Duration Progress to 30 minutes of continuous aerobic without signs/symptoms of physical distress      Intensity   THRR 40-80% of Max Heartrate 59-119    Ratings of Perceived Exertion 11-13    Perceived Dyspnea 0-4      Progression   Progression Continue progressive overload as per policy without signs/symptoms or physical distress.      Resistance Training   Training Prescription Yes    Weight 3    Reps 10-15             Perform Capillary Blood Glucose checks as needed.  Exercise Prescription Changes:   Exercise Prescription Changes     Row Name 05/19/22 1026 05/28/22 1029 06/18/22 1013 06/30/22 1026       Response to Exercise   Blood Pressure (Admit) 102/58 106/58 112/56 116/60    Blood Pressure (Exercise) 160/72 140/70 142/60 168/82    Blood Pressure (Exit) 100/62 94/60 104/58 96/62    Heart Rate (Admit) 76 bpm 80 bpm 76 bpm 66 bpm    Heart Rate (Exercise) 116 bpm 126 bpm 123 bpm 126 bpm    Heart Rate (Exit) 81 bpm 94 bpm 85 bpm 80 bpm    Rating of Perceived Exertion (Exercise) 13 13 11 12     Symptoms None None None None    Comments Off to a good start with exercise. -- -- Received clearance to jog and increase THRR.    Duration Continue with 30 min of aerobic exercise without signs/symptoms of physical distress. Continue with 30 min of aerobic exercise without signs/symptoms of physical distress. Continue with 30 min of aerobic  exercise without signs/symptoms of physical distress. Continue with 30 min of aerobic exercise without signs/symptoms of physical distress.    Intensity THRR unchanged THRR unchanged THRR unchanged THRR New  59-134 bpm      Progression   Progression Continue to progress workloads to maintain intensity without signs/symptoms of physical distress. Continue to progress workloads to maintain intensity without signs/symptoms of physical distress. Continue to progress workloads to maintain intensity without signs/symptoms of physical distress. Continue to progress workloads to maintain intensity without signs/symptoms of physical distress.    Average METs 2.6 2.8 3.4 3.7      Resistance Training   Training Prescription Yes No  Relaxation day, no weights No  Relaxation day, no weights Yes  Weight 3 -- -- 3 lbs    Reps 10-15 -- -- 10-15    Time 10 Minutes -- -- 10 Minutes      Interval Training   Interval Training No No No No      Treadmill   MPH 2.5 2.5 2.8 3.2    Grade 0 0 1 1    Minutes 15 15 15 15     METs 2.91 2.91 3.53 3.89      Arm Ergometer   Level 2 2 3 3     Watts 15 16 24 24     RPM 49 54 63 63    Minutes 15 15 15 15     METs 2.3 2.8 3.3 3.5      Home Exercise Plan   Plans to continue exercise at -- -- [employer] (comment)  Walking and [employer] training at gym. [employer] (comment)  Walking and [employer] training at gym.    Frequency -- -- Add 3 additional days to program exercise sessions. Add 3 additional days to program exercise sessions.    Initial Home Exercises Provided -- -- 06/09/22 06/09/22             Exercise Comments:   Exercise Comments     Row Name 05/19/22 1127 05/30/22 1044 06/09/22 1057 06/18/22 1052 06/30/22 1117   Exercise Comments Patient tolerated first session of exercise well without symptoms. Reviewed METs and goals with patient. Reviewed home exercise guidelines with patient. Reviewed METs and goals with patient. Requested THRR  increase and clearance to jog. Received clearance to jog and to increase THRR. Reviewed goals with patient.    Row Name 07/07/22 1137           Exercise Comments Reviewed goals with patient.                Exercise Goals and Review:   Exercise Goals     Row Name 05/14/22 0955             Exercise Goals   Increase Physical Activity Yes       Intervention Provide advice, education, support and counseling about physical activity/exercise needs.;Develop an individualized exercise prescription for aerobic and resistive training based on initial evaluation findings, risk stratification, comorbidities and participant's personal goals.       Expected Outcomes Short Term: Attend rehab on a regular basis to increase amount of physical activity.;Long Term: Exercising regularly at least 3-5 days a week.;Long Term: Add in home exercise to make exercise part of routine and to increase amount of physical activity.       Increase Strength and Stamina Yes       Intervention Provide advice, education, support and counseling about physical activity/exercise needs.;Develop an individualized exercise prescription for aerobic and resistive training based on initial evaluation findings, risk stratification, comorbidities and participant's personal goals.       Expected Outcomes Short Term: Increase workloads from initial exercise prescription for resistance, speed, and METs.;Short Term: Perform resistance training exercises routinely during rehab and add in resistance training at home;Long Term: Improve cardiorespiratory fitness, muscular endurance and strength as measured by increased METs and functional capacity ( )       Able to understand and use rate of perceived exertion (RPE) scale Yes       Intervention Provide education and explanation on how to use RPE scale       Expected Outcomes Short Term: Able to use RPE daily in rehab to express subjective intensity level;Long Term:  Able  to use RPE to  guide intensity level when exercising independently       Knowledge and understanding of Target Heart Rate Range (THRR) Yes       Intervention Provide education and explanation of THRR including how the numbers were predicted and where they are located for reference       Expected Outcomes Short Term: Able to state/look up THRR;Long Term: Able to use THRR to govern intensity when exercising independently;Short Term: Able to use daily as guideline for intensity in rehab       Understanding of Exercise Prescription Yes       Intervention Provide education, explanation, and written materials on patient's individual exercise prescription       Expected Outcomes Short Term: Able to explain program exercise prescription;Long Term: Able to explain home exercise prescription to exercise independently                Exercise Goals Re-Evaluation :  Exercise Goals Re-Evaluation     Row Name 05/19/22 1127 05/30/22 1044 06/09/22 1057 06/18/22 1052 06/30/22 1117     Exercise Goal Re-Evaluation   Exercise Goals Review Increase Physical Activity;Able to understand and use rate of perceived exertion (RPE) scale Increase Physical Activity;Able to understand and use rate of perceived exertion (RPE) scale Increase Physical Activity;Able to understand and use rate of perceived exertion (RPE) scale;Increase Strength and Stamina;Knowledge and understanding of Target Heart Rate Range (THRR);Able to check pulse independently;Understanding of Exercise Prescription Increase Physical Activity;Able to understand and use rate of perceived exertion (RPE) scale;Increase Strength and Stamina;Knowledge and understanding of Target Heart Rate Range (THRR);Able to check pulse independently;Understanding of Exercise Prescription Increase Physical Activity;Able to understand and use rate of perceived exertion (RPE) scale;Increase Strength and Stamina;Knowledge and understanding of Target Heart Rate Range (THRR);Able to check pulse  independently;Understanding of Exercise Prescription   Comments Patient able to understand and use RPE scale appropriately. Patient feels he's doing well with exercise. His goal is to improve his nutritional habits. Patient may be having carpal tunnel surgery next Tuesday will modify equipment to avoid hand grips. Reviewed exercise prescription with patient. Patient is walking at home and weight training at the gym as his mode of exercise. Patient plans to continue exercise routine at the gym upon completion of the cardiac rehab program. Patient has a FitBit that he can use to monitor his pulse. Patient is interested in jogging. Sent request to patient's cardiologist along with request to increase THRR. Patient aware of clearance to jog and new THRR of 59-134. Patient is currently walking 30 minutes most days of the week and has s FitBit he can use to monitor his pulse. Patient will begin jogging as part of his home exercise routine, walking for 2 minutes and a light jog for 30 seconds for 15 minutes of his walk time.   Expected Outcomes Progress workloads as tolerated to help increase strength and stamina. Patient will attend the Nutrition classes and meet with the dietitan to discuss healthy eat plan. Patient will continue walking and weights at the gym in addition to exercise at cardiac rehab to build a consistent exercise routine and to build upper body strength. Patient will begin jogging when/if cleared by cardiologist to do so. Patient will begin light jogging at home as part of his home exercise routine.    Row Name 07/07/22 1137             Exercise Goal Re-Evaluation   Exercise Goals Review Increase Physical Activity;Able to  understand and use rate of perceived exertion (RPE) scale;Increase Strength and Stamina;Knowledge and understanding of Target Heart Rate Range (THRR);Able to check pulse independently;Understanding of Exercise Prescription       Comments Patient says he started jogging at  home a couple of times last week. He did intervals walking/ jogging for 40 minutes and was able to stay within new THRR except going up hills.       Expected Outcomes Patient will continue interval jogging at home.                Discharge Exercise Prescription (Final Exercise Prescription Changes):  Exercise Prescription Changes - 06/30/22 1026       Response to Exercise   Blood Pressure (Admit) 116/60    Blood Pressure (Exercise) 168/82    Blood Pressure (Exit) 96/62    Heart Rate (Admit) 66 bpm    Heart Rate (Exercise) 126 bpm    Heart Rate (Exit) 80 bpm    Rating of Perceived Exertion (Exercise) 12    Symptoms None    Comments Received clearance to jog and increase THRR.    Duration Continue with 30 min of aerobic exercise without signs/symptoms of physical distress.    Intensity THRR New   59-134 bpm     Progression   Progression Continue to progress workloads to maintain intensity without signs/symptoms of physical distress.    Average METs 3.7      Resistance Training   Training Prescription Yes    Weight 3 lbs    Reps 10-15    Time 10 Minutes      Interval Training   Interval Training No      Treadmill   MPH 3.2    Grade 1    Minutes 15    METs 3.89      Arm Ergometer   Level 3    Watts 24    RPM 63    Minutes 15    METs 3.5      Home Exercise Plan   Plans to continue exercise at [employer] (comment)   Walking and weight training at gym.   Frequency Add 3 additional days to program exercise sessions.    Initial Home Exercises Provided 06/09/22             Nutrition:  Target Goals: Understanding of nutrition guidelines, daily intake of sodium 1500mg , cholesterol 200mg , calories 30% from fat and 7% or less from saturated fats, daily to have 5 or more servings of fruits and vegetables.  Biometrics:  Pre Biometrics - 05/14/22 0940       Pre Biometrics   Waist Circumference 37.5 inches    Hip Circumference 39 inches    Waist to Hip  Ratio 0.96 %    Triceps Skinfold 20 mm    % Body Fat 26.2 %    Grip Strength 34 kg    Flexibility 15.25 in    Single Leg Stand 6.12 seconds             Post Biometrics - 07/21/22 1210        Post  Biometrics   Height 5' 9.25" (1.759 m)    Weight 74.6 kg    Waist Circumference 38 inches    Hip Circumference 39 inches    Waist to Hip Ratio 0.97 %    BMI (Calculated) 24.11    Triceps Skinfold 12 mm    % Body Fat 24.4 %   % change of -6.87  Grip Strength 32 kg   % change of -5.9   Flexibility 16 in   % change of +4.9   Single Leg Stand 4.12 seconds             Nutrition Therapy Plan and Nutrition Goals:  Nutrition Therapy & Goals - 07/16/22 0851       Nutrition Therapy   Diet Heart Healthy/Carbohydrate Consistent Diet    Drug/Food Interactions Statins/Certain Fruits      Personal Nutrition Goals   Nutrition Goal Patient to identify strategies for reducing cardiovascular risk by attending the weekly [employer] education and nutrition series    Personal Goal #2 Patient to improve diet quality by using the plate method as a daily guide for meal planning to include lean protein/plant protein, fruits, vegetables, whole grains, nonfat dairy as part of well balanced diet    Personal Goal #3 Patient to reduce sodium to 1500mg  per day    Personal Goal #4 Patient to identify strategies for blood sugar control with goal A1c <7%    Comments Goals in action. Steven Lawrence continues to attend the [employer] education and nutrition series regularly and completed one on one nutrition counseling with his wife. He has good nutrition knowledge of heart healthy diet including increased fiber, decreased saturated fat, and reduced sodium intake. He continues to work on blood sugar control and reports improved am/fasting blood sugars. He has adopted more plant based eating and increased dietary fiber significantly. Steven Lawrence will continue to benefit from participation in intensive cardiac rehab for nutrition,  exercise, and lifestyle modificaton.      Intervention Plan   Intervention Prescribe, educate and counsel regarding individualized specific dietary modifications aiming towards targeted core components such as weight, hypertension, lipid management, diabetes, heart failure and other comorbidities.;Nutrition handout(s) given to patient.    Expected Outcomes Short Term Goal: Understand basic principles of dietary content, such as calories, fat, sodium, cholesterol and nutrients.;Long Term Goal: Adherence to prescribed nutrition plan.             Nutrition Assessments:  Nutrition Assessments - 05/19/22 1235       Rate Your Plate Scores   Pre Score 53            MEDIFICTS Score Key: ?70 Need to make dietary changes  40-70 Heart Healthy Diet ? 40 Therapeutic Level Cholesterol Diet   Flowsheet Row INTENSIVE CARDIAC REHAB from 05/19/2022 in [facility] for Heart, Vascular, & Lung Health  Picture Your Plate Total Score on Admission 53      Picture Your Plate Scores: <78 Unhealthy dietary pattern with much room for improvement. 41-50 Dietary pattern unlikely to meet recommendations for good health and room for improvement. 51-60 More healthful dietary pattern, with some room for improvement.  >60 Healthy dietary pattern, although there may be some specific behaviors that could be improved.    Nutrition Goals Re-Evaluation:  Nutrition Goals Re-Evaluation     Row Name 05/19/22 1120 06/16/22 1314 07/16/22 0851         Goals   Current Weight 163 lb 9.3 oz (74.2 kg) 163 lb 12.8 oz (74.3 kg) 160 lb 11.5 oz (72.9 kg)     Comment lipids WNL, A1c 7.7 no new labs at this time; most recent labs show  lipids WNL, A1c 7.5 no new labs at this time; most recent labs show lipids WNL, A1c 7.5     Expected Outcome Goals in action. Steven Lawrence reports making many lifestyle changes since his most  recent cardiac event including reduced saturated fat intake, switched to whole  grains, and is reading food labels for sodium. He has previously completed cardiac rehab in the past. He does report fasting/am blood sugars have improved >30points. Steven Lawrence will continue to benefit from participation in intensive cardiac rehab for nutrition, exercise, and lifestyle modificaton. Goals in action. Steven Lawrence continues to attend the [employer] and nutrition series regularly. He has good nutrition knowledge of heart healthy diet including increased fiber, decreased saturated fat, and reduced sodium intake. He continues to work on blood sugar control. He has previously completed cardiac rehab in the past. He does report fasting/am blood sugars remain improved.Steven Lawrence will continue to benefit from participation in intensive cardiac rehab for nutrition, exercise, and lifestyle modificaton. Goals in action. Cesare continues to attend the [employer] education and nutrition series regularly and completed one on one nutrition counseling with his wife. He has good nutrition knowledge of heart healthy diet including increased fiber, decreased saturated fat, and reduced sodium intake. He continues to work on blood sugar control and reports improved am/fasting blood sugars. He has adopted more plant based eating and increased dietary fiber significantly. Steven Lawrence will continue to benefit from participation in intensive cardiac rehab for nutrition, exercise, and lifestyle modificaton.              Nutrition Goals Re-Evaluation:  Nutrition Goals Re-Evaluation     Row Name 05/19/22 1120 06/16/22 1314 07/16/22 0851         Goals   Current Weight 163 lb 9.3 oz (74.2 kg) 163 lb 12.8 oz (74.3 kg) 160 lb 11.5 oz (72.9 kg)     Comment lipids WNL, A1c 7.7 no new labs at this time; most recent labs show  lipids WNL, A1c 7.5 no new labs at this time; most recent labs show lipids WNL, A1c 7.5     Expected Outcome Goals in action. Steven Lawrence reports making many lifestyle changes since his most recent cardiac event including  reduced saturated fat intake, switched to whole grains, and is reading food labels for sodium. He has previously completed cardiac rehab in the past. He does report fasting/am blood sugars have improved >30points. Steven Lawrence will continue to benefit from participation in intensive cardiac rehab for nutrition, exercise, and lifestyle modificaton. Goals in action. Steven Lawrence continues to attend the [employer] and nutrition series regularly. He has good nutrition knowledge of heart healthy diet including increased fiber, decreased saturated fat, and reduced sodium intake. He continues to work on blood sugar control. He has previously completed cardiac rehab in the past. He does report fasting/am blood sugars remain improved.Steven Lawrence will continue to benefit from participation in intensive cardiac rehab for nutrition, exercise, and lifestyle modificaton. Goals in action. Steven Lawrence continues to attend the [employer] education and nutrition series regularly and completed one on one nutrition counseling with his wife. He has good nutrition knowledge of heart healthy diet including increased fiber, decreased saturated fat, and reduced sodium intake. He continues to work on blood sugar control and reports improved am/fasting blood sugars. He has adopted more plant based eating and increased dietary fiber significantly. Steven Lawrence will continue to benefit from participation in intensive cardiac rehab for nutrition, exercise, and lifestyle modificaton.              Nutrition Goals Discharge (Final Nutrition Goals Re-Evaluation):  Nutrition Goals Re-Evaluation - 07/16/22 0851       Goals   Current Weight 160 lb 11.5 oz (72.9 kg)    Comment no  new labs at this time; most recent labs show lipids WNL, A1c 7.5    Expected Outcome Goals in action. Steven Lawrence continues to attend the P[facility]ikin education and nutrition series regularly and completed one on one nutrition counseling with his wife. He has good nutrition knowledge of heart healthy  diet including increased fiber, decreased saturated fat, and reduced sodium intake. He continues to work on blood sugar control and reports improved am/fasting blood sugars. He has adopted more plant based eating and increased dietary fiber significantly. Steven Lawrence will continue to benefit from participation in intensive cardiac rehab for nutrition, exercise, and lifestyle modificaton.             Psychosocial: Target Goals: Acknowledge presence or absence of significant depression and/or stress, maximize coping skills, provide positive support system. Participant is able to verbalize types and ability to use techniques and skills needed for reducing stress and depression.  Initial Review & Psychosocial Screening:  Initial Psych Review & Screening - 05/14/22 0942       Initial Review   Current issues with None Identified      Family Dynamics   Good Support System? Yes   Steven Lawrence has his wife for support   Comments Steven Lawrence presents to cardiac rehab with no feelings of depression, stress, or anxiety. He is excited to start the new intensive cardiac rehab education.      Barriers   Psychosocial barriers to participate in program There are no identifiable barriers or psychosocial needs.      Screening Interventions   Interventions Encouraged to exercise;To provide support and resources with identified psychosocial needs;Provide feedback about the scores to participant    Expected Outcomes Short Term goal: Identification and review with participant of any Quality of Life or Depression concerns found by scoring the questionnaire.;Long Term goal: The participant improves quality of Life and PHQ9 Scores as seen by post scores and/or verbalization of changes             Quality of Life Scores:  Quality of Life - 05/14/22 0957       Quality of Life   Select Quality of Life      Quality of Life Scores   Health/Function Pre 25.7 %    Socioeconomic Pre 28.07 %    Psych/Spiritual Pre 27.93 %     Family Pre 28.8 %    GLOBAL Pre 27.1 %            Scores of 19 and below usually indicate a poorer quality of life in these areas.  A difference of  2-3 points is a clinically meaningful difference.  A difference of 2-3 points in the total score of the Quality of Life Index has been associated with significant improvement in overall quality of life, self-image, physical symptoms, and general health in studies assessing change in quality of life.  PHQ-9: Review Flowsheet       05/14/2022 05/10/2020 03/13/2020  Depression screen PHQ 2/9  Decreased Interest 0 0 0  Down, Depressed, Hopeless 1 0 0  PHQ - 2 Score 1 0 0  Altered sleeping 0 - -  Tired, decreased energy 1 - -  Change in appetite 0 - -  Feeling bad or failure about yourself  0 - -  Trouble concentrating 0 - -  Moving slowly or fidgety/restless 0 - -  Suicidal thoughts 0 - -  PHQ-9 Score 2 - -  Difficult doing work/chores Not difficult at all - -   Interpretation of Total  Score  Total Score Depression Severity:  1-4 = Minimal depression, 5-9 = Mild depression, 10-14 = Moderate depression, 15-19 = Moderately severe depression, 20-27 = Severe depression   Psychosocial Evaluation and Intervention:   Psychosocial Re-Evaluation:  Psychosocial Re-Evaluation     Row Name 05/20/22 0741 06/03/22 1631 06/26/22 1318 07/22/22 1421       Psychosocial Re-Evaluation   Current issues with None Identified None Identified None Identified None Identified    Interventions Encouraged to attend Cardiac Rehabilitation for the exercise Encouraged to attend Cardiac Rehabilitation for the exercise Encouraged to attend Cardiac Rehabilitation for the exercise Encouraged to attend Cardiac Rehabilitation for the exercise    Continue Psychosocial Services  No Follow up required No Follow up required No Follow up required No Follow up required             Psychosocial Discharge (Final Psychosocial Re-Evaluation):  Psychosocial Re-Evaluation  - 07/22/22 1421       Psychosocial Re-Evaluation   Current issues with None Identified    Interventions Encouraged to attend Cardiac Rehabilitation for the exercise    Continue Psychosocial Services  No Follow up required             Vocational Rehabilitation: Provide vocational rehab assistance to qualifying candidates.   Vocational Rehab Evaluation & Intervention:  Vocational Rehab - 05/14/22 0946       Initial Vocational Rehab Evaluation & Intervention   Assessment shows need for Vocational Rehabilitation No   Yvonne is retired            Education: Education Goals: Education classes will be provided on a weekly basis, covering required topics. Participant will state understanding/return demonstration of topics presented.    Education     Row Name 05/19/22 1600     Education   Cardiac Education Topics Pritikin   Geographical information systems officer Psychosocial   Psychosocial Workshop Other  From [employer] to Heart   Instruction Review Code 1- Verbalizes Understanding   Class Start Time 1145   Class Stop Time 1231   Class Time Calculation (min) 46 min    Row Name 05/26/22 1400     Education   Cardiac Education Topics Pritikin   Select Workshops     Workshops   Educator Exercise Physiologist   Select Exercise   Exercise Workshop Location manager and Fall Prevention   Instruction Review Code 1- Verbalizes Understanding   Class Start Time 1145   Class Stop Time 1230   Class Time Calculation (min) 45 min    Row Name 05/28/22 1300     Education   Cardiac Education Topics Pritikin   Customer service manager   Weekly Topic Fast and Healthy Breakfasts   Instruction Review Code 1- Verbalizes Understanding   Class Start Time 1145   Class Stop Time 1226   Class Time Calculation (min) 41 min    Row Name 05/30/22 1200     Education   Cardiac Education Topics Pritikin   Mudlogger Nutrition   Nutrition Fueling a Healthy Body   Instruction Review Code 1- Verbalizes Understanding   Class Start Time 1150   Class Stop Time 1230   Class Time Calculation (min) 40 min    Row Name 06/02/22 1700     Education   Cardiac  Education Topics Pritikin   Geographical information systems officer Psychosocial   Psychosocial Workshop Recognizing and Reducing Stress   Instruction Review Code 1- Verbalizes Understanding   Class Start Time 1148   Class Stop Time 1233   Class Time Calculation (min) 45 min    Row Name 06/04/22 1200     Education   Cardiac Education Topics Pritikin   Customer service manager   Weekly Topic Personalizing Your Pritikin Plate   Instruction Review Code 1- Verbalizes Understanding   Class Start Time 1140   Class Stop Time 1220   Class Time Calculation (min) 40 min    Row Name 06/09/22 1300     Education   Cardiac Education Topics Pritikin   Glass blower/designer Nutrition   Nutrition Workshop Label Reading   Instruction Review Code 1- Tax inspector   Class Start Time 1150   Class Stop Time 1233   Class Time Calculation (min) 43 min    Row Name 06/11/22 1230     Education   Cardiac Education Topics Pritikin   Customer service manager   Weekly Topic Tasty Appetizers and Snacks   Instruction Review Code 1- Verbalizes Understanding   Class Start Time 1150   Class Stop Time 1226   Class Time Calculation (min) 36 min    Row Name 06/13/22 1200     Education   Cardiac Education Topics Pritikin   Nurse, children's   Educator Dietitian   Nutrition Other   Instruction Review Code 1- Verbalizes Understanding   Class Start Time 1150   Class Stop Time 1226   Class Time Calculation (min) 36 min     Row Name 06/16/22 1300     Education   Cardiac Education Topics Pritikin   Geographical information systems officer Exercise   Exercise Workshop Exercise Basics: Diplomatic Services operational officer   Instruction Review Code 1- Verbalizes Understanding   Class Start Time 1150   Class Stop Time 1235   Class Time Calculation (min) 45 min    Row Name 06/18/22 1500     Education   Cardiac Education Topics Pritikin   Orthoptist   Educator Dietitian   Weekly Topic Tasty Appetizers and Snacks   Instruction Review Code 1- Verbalizes Understanding   Class Start Time 1145   Class Stop Time 1223   Class Time Calculation (min) 38 min    Row Name 06/23/22 1300     Education   Cardiac Education Topics Pritikin   Nurse, children's   Educator Dietitian   Select Nutrition   Nutrition Nutrition Action Plan   Instruction Review Code 1- Verbalizes Understanding   Class Start Time 1145   Class Stop Time 1219   Class Time Calculation (min) 34 min    Row Name 06/25/22 1300     Education   Cardiac Education Topics Pritikin   Environmental education officer - Meals in a Snap   Instruction Review Code 1- Verbalizes Understanding   Class Start  Time 1140   Class Stop Time 1220   Class Time Calculation (min) 40 min    Row Name 06/27/22 1400     Education   Cardiac Education Topics Pritikin   Psychologist, forensic Exercise Education   Exercise Education Move It!   Instruction Review Code 1- Verbalizes Understanding   Class Start Time 1150   Class Stop Time 1230   Class Time Calculation (min) 40 min    Row Name 06/30/22 1600     Education   Cardiac Education Topics Pritikin   Geographical information systems officer Psychosocial   Psychosocial  Workshop Focused Goals, Sustainable Changes   Instruction Review Code 1- Verbalizes Understanding   Class Start Time 1150   Class Stop Time 1225   Class Time Calculation (min) 35 min    Row Name 07/02/22 1300     Education   Cardiac Education Topics Pritikin   Orthoptist   Educator Dietitian   Weekly Topic One-Pot Wonders   Instruction Review Code 1- Verbalizes Understanding   Class Start Time 1140   Class Stop Time 1212   Class Time Calculation (min) 32 min    Row Name 07/07/22 1300     Education   Cardiac Education Topics Pritikin   Select Core Videos     Core Videos   Educator Nurse   Select Exercise Education   Exercise Education Biomechanial Limitations   Instruction Review Code 1- Verbalizes Understanding   Class Start Time 1156   Class Stop Time 1232   Class Time Calculation (min) 36 min    Row Name 07/09/22 1300     Education   Cardiac Education Topics Pritikin   Customer service manager   Weekly Topic Comforting Weekend Breakfasts   Instruction Review Code 1- Verbalizes Understanding   Class Start Time 1135   Class Stop Time 1223   Class Time Calculation (min) 48 min    Row Name 07/18/22 1000     Education   Cardiac Education Topics Pritikin   Licensed conveyancer Nutrition   Nutrition Vitamins and Minerals   Instruction Review Code 1Trenton Gammon Understanding    Row Name 07/21/22 1600     Education   Cardiac Education Topics Pritikin   Select Workshops     Workshops   Educator Exercise Physiologist   Select Psychosocial   Psychosocial Workshop Healthy Sleep for a Healthy Heart   Instruction Review Code 1- Verbalizes Understanding   Class Start Time 1145   Class Stop Time 1233   Class Time Calculation (min) 48 min            Core Videos: Exercise    Move It!  Clinical staff conducted group or individual video  education with verbal and written material and guidebook.  Patient learns the recommended Pritikin exercise program. Exercise with the goal of living a long, healthy life. Some of the health benefits of exercise include controlled diabetes, healthier blood pressure levels, improved cholesterol levels, improved heart and lung capacity, improved sleep, and better body composition. Everyone should speak with their doctor before starting or changing an exercise routine.  Biomechanical Limitations Clinical staff conducted group or individual video education  with verbal and written material and guidebook.  Patient learns how biomechanical limitations can impact exercise and how we can mitigate and possibly overcome limitations to have an impactful and balanced exercise routine.  Body Composition Clinical staff conducted group or individual video education with verbal and written material and guidebook.  Patient learns that body composition (ratio of muscle mass to fat mass) is a key component to assessing overall fitness, rather than body weight alone. Increased fat mass, especially visceral belly fat, can put [place] at increased risk for metabolic syndrome, type 2 diabetes, heart disease, and even death. It is recommended to combine diet and exercise (cardiovascular and resistance training) to improve your body composition. Seek guidance from your physician and exercise physiologist before implementing an exercise routine.  Exercise Action Plan Clinical staff conducted group or individual video education with verbal and written material and guidebook.  Patient learns the recommended strategies to achieve and enjoy long-term exercise adherence, including variety, self-motivation, self-efficacy, and positive decision making. Benefits of exercise include fitness, good health, weight management, more energy, better sleep, less stress, and overall well-being.  Medical   Heart Disease Risk Reduction Clinical staff  conducted group or individual video education with verbal and written material and guidebook.  Patient learns our heart is our most vital organ as it circulates oxygen, nutrients, white blood cells, and hormones throughout the entire body, and carries waste away. Data supports a plant-based eating plan like the Pritikin Program for its effectiveness in slowing progression of and reversing heart disease. The video provides a number of recommendations to address heart disease.   Metabolic Syndrome and Belly Fat  Clinical staff conducted group or individual video education with verbal and written material and guidebook.  Patient learns what metabolic syndrome is, how it leads to heart disease, and how one can reverse it and keep it from coming back. You have metabolic syndrome if you have 3 of the following 5 criteria: abdominal obesity, high blood pressure, high triglycerides, low HDL cholesterol, and high blood sugar.  Hypertension and Heart Disease Clinical staff conducted group or individual video education with verbal and written material and guidebook.  Patient learns that high blood pressure, or hypertension, is very common in the [place]. Hypertension is largely due to excessive salt intake, but other important risk factors include being overweight, physical inactivity, drinking too much alcohol, smoking, and not eating enough potassium from fruits and vegetables. High blood pressure is a leading risk factor for heart attack, stroke, congestive heart failure, dementia, kidney failure, and premature death. Long-term effects of excessive salt intake include stiffening of the arteries and thickening of heart muscle and organ damage. Recommendations include ways to reduce hypertension and the risk of heart disease.  Diseases of Our Time - Focusing on Diabetes Clinical staff conducted group or individual video education with verbal and written material and guidebook.  Patient learns why the best  way to stop diseases of our time is prevention, through food and other lifestyle changes. Medicine (such as prescription pills and surgeries) is often only a Band-Aid on the problem, not a long-term solution. Most common diseases of our time include obesity, type 2 diabetes, hypertension, heart disease, and cancer. The Pritikin Program is recommended and has been proven to help reduce, reverse, and/or prevent the damaging effects of metabolic syndrome.  Nutrition   Overview of the Pritikin Eating Plan  Clinical staff conducted group or individual video education with verbal and written material and guidebook.  Patient learns about  the Pritikin Eating Plan for disease risk reduction. The Pritikin Eating Plan emphasizes a wide variety of unrefined, minimally-processed carbohydrates, like fruits, vegetables, whole grains, and legumes. Go, Caution, and Stop food choices are explained. Plant-based and lean animal proteins are emphasized. Rationale provided for low sodium intake for blood pressure control, low added sugars for blood sugar stabilization, and low added fats and oils for coronary artery disease risk reduction and weight management.  Calorie Density  Clinical staff conducted group or individual video education with verbal and written material and guidebook.  Patient learns about calorie density and how it impacts the Pritikin Eating Plan. Knowing the characteristics of the food you choose will help you decide whether those foods will lead to weight gain or weight loss, and whether you want to consume more or less of them. Weight loss is usually a side effect of the Pritikin Eating Plan because of its focus on low calorie-dense foods.  Label Reading  Clinical staff conducted group or individual video education with verbal and written material and guidebook.  Patient learns about the Pritikin recommended label reading guidelines and corresponding recommendations regarding calorie density, added  sugars, sodium content, and whole grains.  Dining Out - Part 1  Clinical staff conducted group or individual video education with verbal and written material and guidebook.  Patient learns that restaurant meals can be sabotaging because they can be so high in calories, fat, sodium, and/or sugar. Patient learns recommended strategies on how to positively address this and avoid unhealthy pitfalls.  Facts on Fats  Clinical staff conducted group or individual video education with verbal and written material and guidebook.  Patient learns that lifestyle modifications can be just as effective, if not more so, as many medications for lowering your risk of heart disease. A Pritikin lifestyle can help to reduce your risk of inflammation and atherosclerosis (cholesterol build-up, or plaque, in the artery walls). Lifestyle interventions such as dietary choices and physical activity address the cause of atherosclerosis. A review of the types of fats and their impact on blood cholesterol levels, along with dietary recommendations to reduce fat intake is also included.  Nutrition Action Plan  Clinical staff conducted group or individual video education with verbal and written material and guidebook.  Patient learns how to incorporate Pritikin recommendations into their lifestyle. Recommendations include planning and keeping personal health goals in mind as an important part of their success.  Healthy Mind-Set    Healthy Minds, Bodies, Hearts  Clinical staff conducted group or individual video education with verbal and written material and guidebook.  Patient learns how to identify when they are stressed. Video will discuss the impact of that stress, as well as the many benefits of stress management. Patient will also be introduced to stress management techniques. The way we think, act, and feel has an impact on our hearts.  How Our Thoughts Can Heal Our Hearts  Clinical staff conducted group or individual  video education with verbal and written material and guidebook.  Patient learns that negative thoughts can cause depression and anxiety. This can result in negative lifestyle behavior and serious health problems. Cognitive behavioral therapy is an effective method to help control our thoughts in order to change and improve our emotional outlook.  Additional Videos:  Exercise    Improving Performance  Clinical staff conducted group or individual video education with verbal and written material and guidebook.  Patient learns to use a non-linear approach by alternating intensity levels and lengths of time spent  exercising to help burn more calories and lose more body fat. Cardiovascular exercise helps improve heart health, metabolism, hormonal balance, blood sugar control, and recovery from fatigue. Resistance training improves strength, endurance, balance, coordination, reaction time, metabolism, and muscle mass. Flexibility exercise improves circulation, posture, and balance. Seek guidance from your physician and exercise physiologist before implementing an exercise routine and learn your capabilities and proper form for all exercise.  Introduction to Yoga  Clinical staff conducted group or individual video education with verbal and written material and guidebook.  Patient learns about yoga, a discipline of the coming together of mind, breath, and body. The benefits of yoga include improved flexibility, improved range of motion, better posture and core strength, increased lung function, weight loss, and positive self-image. Yoga's heart health benefits include lowered blood pressure, healthier heart rate, decreased cholesterol and triglyceride levels, improved immune function, and reduced stress. Seek guidance from your physician and exercise physiologist before implementing an exercise routine and learn your capabilities and proper form for all exercise.  Medical   Aging: Enhancing Your Quality of  Life  Clinical staff conducted group or individual video education with verbal and written material and guidebook.  Patient learns key strategies and recommendations to stay in good physical health and enhance quality of life, such as prevention strategies, having an advocate, securing a Health Care Proxy and Power of Attorney, and keeping a list of medications and system for tracking them. It also discusses how to avoid risk for bone loss.  Biology of Weight Control  Clinical staff conducted group or individual video education with verbal and written material and guidebook.  Patient learns that weight gain occurs because we consume more calories than we burn (eating more, moving less). Even if your body weight is normal, you may have higher ratios of fat compared to muscle mass. Too much body fat puts you at increased risk for cardiovascular disease, heart attack, stroke, type 2 diabetes, and obesity-related cancers. In addition to exercise, following the Pritikin Eating Plan can help reduce your risk.  Decoding Lab Results  Clinical staff conducted group or individual video education with verbal and written material and guidebook.  Patient learns that lab test reflects one measurement whose values change over time and are influenced by many factors, including medication, stress, sleep, exercise, food, hydration, pre-existing medical conditions, and more. It is recommended to use the knowledge from this video to become more involved with your lab results and evaluate your numbers to speak with your doctor.   Diseases of Our Time - Overview  Clinical staff conducted group or individual video education with verbal and written material and guidebook.  Patient learns that according to the CDC, 50% to 70% of chronic diseases (such as obesity, type 2 diabetes, elevated lipids, hypertension, and heart disease) are avoidable through lifestyle improvements including healthier food choices, listening to  satiety cues, and increased physical activity.  Sleep Disorders Clinical staff conducted group or individual video education with verbal and written material and guidebook.  Patient learns how good quality and duration of sleep are important to overall health and well-being. Patient also learns about sleep disorders and how they impact health along with recommendations to address them, including discussing with a physician.  Nutrition  Dining Out - Part 2 Clinical staff conducted group or individual video education with verbal and written material and guidebook.  Patient learns how to plan ahead and communicate in order to maximize their dining experience in a healthy and nutritious manner. Included  are recommended food choices based on the type of restaurant the patient is visiting.   Fueling a Banker conducted group or individual video education with verbal and written material and guidebook.  There is a strong connection between our food choices and our health. Diseases like obesity and type 2 diabetes are very prevalent and are in large-part due to lifestyle choices. The Pritikin Eating Plan provides plenty of food and hunger-curbing satisfaction. It is easy to follow, affordable, and helps reduce health risks.  Menu Workshop  Clinical staff conducted group or individual video education with verbal and written material and guidebook.  Patient learns that restaurant meals can sabotage health goals because they are often packed with calories, fat, sodium, and sugar. Recommendations include strategies to plan ahead and to communicate with the manager, chef, or server to help order a healthier meal.  Planning Your Eating Strategy  Clinical staff conducted group or individual video education with verbal and written material and guidebook.  Patient learns about the Pritikin Eating Plan and its benefit of reducing the risk of disease. The Pritikin Eating Plan does not focus on  calories. Instead, it emphasizes high-quality, nutrient-rich foods. By knowing the characteristics of the foods, we choose, we can determine their calorie density and make informed decisions.  Targeting Your Nutrition Priorities  Clinical staff conducted group or individual video education with verbal and written material and guidebook.  Patient learns that lifestyle habits have a tremendous impact on disease risk and progression. This video provides eating and physical activity recommendations based on your personal health goals, such as reducing LDL cholesterol, losing weight, preventing or controlling type 2 diabetes, and reducing high blood pressure.  Vitamins and Minerals  Clinical staff conducted group or individual video education with verbal and written material and guidebook.  Patient learns different ways to obtain key vitamins and minerals, including through a recommended healthy diet. It is important to discuss all supplements you take with your doctor.   Healthy Mind-Set    Smoking Cessation  Clinical staff conducted group or individual video education with verbal and written material and guidebook.  Patient learns that cigarette smoking and tobacco addiction pose a serious health risk which affects millions of people. Stopping smoking will significantly reduce the risk of heart disease, lung disease, and many forms of cancer. Recommended strategies for quitting are covered, including working with your doctor to develop a successful plan.  Culinary   Becoming a Set designer conducted group or individual video education with verbal and written material and guidebook.  Patient learns that cooking at home can be healthy, cost-effective, quick, and puts them in control. Keys to cooking healthy recipes will include looking at your recipe, assessing your equipment needs, planning ahead, making it simple, choosing cost-effective seasonal ingredients, and limiting the use of  added fats, salts, and sugars.  Cooking - Breakfast and Snacks  Clinical staff conducted group or individual video education with verbal and written material and guidebook.  Patient learns how important breakfast is to satiety and nutrition through the entire day. Recommendations include key foods to eat during breakfast to help stabilize blood sugar levels and to prevent overeating at meals later in the day. Planning ahead is also a key component.  Cooking - Educational psychologist conducted group or individual video education with verbal and written material and guidebook.  Patient learns eating strategies to improve overall health, including an approach to cook more at home.  Recommendations include thinking of animal protein as a side on your plate rather than center stage and focusing instead on lower calorie dense options like vegetables, fruits, whole grains, and plant-based proteins, such as beans. Making sauces in large quantities to freeze for later and leaving the skin on your vegetables are also recommended to maximize your experience.  Cooking - Healthy Salads and Dressing Clinical staff conducted group or individual video education with verbal and written material and guidebook.  Patient learns that vegetables, fruits, whole grains, and legumes are the foundations of the Pritikin Eating Plan. Recommendations include how to incorporate each of these in flavorful and healthy salads, and how to create homemade salad dressings. Proper handling of ingredients is also covered. Cooking - Soups and [employer] - Soups and Desserts Clinical staff conducted group or individual video education with verbal and written material and guidebook.  Patient learns that Pritikin soups and desserts make for easy, nutritious, and delicious snacks and meal components that are low in sodium, fat, sugar, and calorie density, while high in vitamins, minerals, and filling fiber. Recommendations  include simple and healthy ideas for soups and desserts.   Overview     The Pritikin Solution Program Overview Clinical staff conducted group or individual video education with verbal and written material and guidebook.  Patient learns that the results of the Pritikin Program have been documented in more than 100 articles published in peer-reviewed journals, and the benefits include reducing risk factors for (and, in some cases, even reversing) high cholesterol, high blood pressure, type 2 diabetes, obesity, and more! An overview of the three key pillars of the Pritikin Program will be covered: eating well, doing regular exercise, and having a healthy mind-set.  WORKSHOPS  Exercise: Exercise Basics: Building Your Action Plan Clinical staff led group instruction and group discussion with PowerPoint presentation and patient guidebook. To enhance the learning environment the use of posters, models and videos may be added. At the conclusion of this workshop, patients will comprehend the difference between physical activity and exercise, as well as the benefits of incorporating both, into their routine. Patients will understand the FITT (Frequency, Intensity, Time, and Type) principle and how to use it to build an exercise action plan. In addition, safety concerns and other considerations for exercise and cardiac rehab will be addressed by the presenter. The purpose of this lesson is to promote a comprehensive and effective weekly exercise routine in order to improve patients' overall level of fitness.   Managing Heart Disease: Your Path to a Healthier Heart Clinical staff led group instruction and group discussion with PowerPoint presentation and patient guidebook. To enhance the learning environment the use of posters, models and videos may be added.At the conclusion of this workshop, patients will understand the anatomy and physiology of the heart. Additionally, they will understand how  Pritikin's three pillars impact the risk factors, the progression, and the management of heart disease.  The purpose of this lesson is to provide a high-level overview of the heart, heart disease, and how the Pritikin lifestyle positively impacts risk factors.  Exercise Biomechanics Clinical staff led group instruction and group discussion with PowerPoint presentation and patient guidebook. To enhance the learning environment the use of posters, models and videos may be added. Patients will learn how the structural parts of their bodies function and how these functions impact their daily activities, movement, and exercise. Patients will learn how to promote a neutral spine, learn how to manage pain, and identify ways to  improve their physical movement in order to promote healthy living. The purpose of this lesson is to expose patients to common physical limitations that impact physical activity. Participants will learn practical ways to adapt and manage aches and pains, and to minimize their effect on regular exercise. Patients will learn how to maintain good posture while sitting, walking, and lifting.  Balance Training and Fall Prevention  Clinical staff led group instruction and group discussion with PowerPoint presentation and patient guidebook. To enhance the learning environment the use of posters, models and videos may be added. At the conclusion of this workshop, patients will understand the importance of their sensorimotor skills (vision, proprioception, and the vestibular system) in maintaining their ability to balance as they age. Patients will apply a variety of balancing exercises that are appropriate for their current level of function. Patients will understand the common causes for poor balance, possible solutions to these problems, and ways to modify their physical environment in order to minimize their fall risk. The purpose of this lesson is to teach patients about the  importance of maintaining balance as they age and ways to minimize their risk of falling.  WORKSHOPS   Nutrition:  Fueling a Ship broker led group instruction and group discussion with PowerPoint presentation and patient guidebook. To enhance the learning environment the use of posters, models and videos may be added. Patients will review the foundational principles of the Pritikin Eating Plan and understand what constitutes a serving size in each of the food groups. Patients will also learn Pritikin-friendly foods that are better choices when away from home and review make-ahead meal and snack options. Calorie density will be reviewed and applied to three nutrition priorities: weight maintenance, weight loss, and weight gain. The purpose of this lesson is to reinforce (in a group setting) the key concepts around what patients are recommended to eat and how to apply these guidelines when away from home by planning and selecting Pritikin-friendly options. Patients will understand how calorie density may be adjusted for different weight management goals.  Mindful Eating  Clinical staff led group instruction and group discussion with PowerPoint presentation and patient guidebook. To enhance the learning environment the use of posters, models and videos may be added. Patients will briefly review the concepts of the Pritikin Eating Plan and the importance of low-calorie dense foods. The concept of mindful eating will be introduced as well as the importance of paying attention to internal hunger signals. Triggers for non-hunger eating and techniques for dealing with triggers will be explored. The purpose of this lesson is to provide patients with the opportunity to review the basic principles of the Pritikin Eating Plan, discuss the value of eating mindfully and how to measure internal cues of hunger and fullness using the Hunger Scale. Patients will also discuss reasons for non-hunger eating and  learn strategies to use for controlling emotional eating.  Targeting Your Nutrition Priorities Clinical staff led group instruction and group discussion with PowerPoint presentation and patient guidebook. To enhance the learning environment the use of posters, models and videos may be added. Patients will learn how to determine their genetic susceptibility to disease by reviewing their family history. Patients will gain insight into the importance of diet as part of an overall healthy lifestyle in mitigating the impact of genetics and other environmental insults. The purpose of this lesson is to provide patients with the opportunity to assess their personal nutrition priorities by looking at their family history, their own health history  and current risk factors. Patients will also be able to discuss ways of prioritizing and modifying the Pritikin Eating Plan for their highest risk areas  Menu  Clinical staff led group instruction and group discussion with PowerPoint presentation and patient guidebook. To enhance the learning environment the use of posters, models and videos may be added. Using menus brought in from [employer], or printed from [employer], patients will apply the Pritikin dining out guidelines that were presented in the [employer] video. Patients will also be able to practice these guidelines in a variety of provided scenarios. The purpose of this lesson is to provide patients with the opportunity to practice hands-on learning of the Pritikin Dining Out guidelines with actual menus and practice scenarios.  Label Reading Clinical staff led group instruction and group discussion with PowerPoint presentation and patient guidebook. To enhance the learning environment the use of posters, models and videos may be added. Patients will review and discuss the Pritikin label reading guidelines presented in Pritikin's Label Reading Educational series video. Using fool  labels brought in from local grocery stores and markets, patients will apply the label reading guidelines and determine if the packaged food meet the Pritikin guidelines. The purpose of this lesson is to provide patients with the opportunity to review, discuss, and practice hands-on learning of the Pritikin Label Reading guidelines with actual packaged food labels. Cooking School  Pritikin's [employer] are designed to teach patients ways to prepare quick, simple, and affordable recipes at home. The importance of nutrition's role in chronic disease risk reduction is reflected in its emphasis in the overall Pritikin program. By learning how to prepare essential core Pritikin Eating Plan recipes, patients will increase control over what they eat; be able to customize the flavor of foods without the use of added salt, sugar, or fat; and improve the quality of the food they consume. By learning a set of core recipes which are easily assembled, quickly prepared, and affordable, patients are more likely to prepare more healthy foods at home. These workshops focus on convenient breakfasts, simple entres, side dishes, and desserts which can be prepared with minimal effort and are consistent with nutrition recommendations for cardiovascular risk reduction. Cooking [employer] are taught by a Armed forces logistics/support/administrative officer (RD) who has been trained by the [employer]. The chef or RD has a clear understanding of the importance of minimizing - if not completely eliminating - added fat, sugar, and sodium in recipes. Throughout the series of Cooking School Workshop sessions, patients will learn about healthy ingredients and efficient methods of cooking to build confidence in their capability to prepare    Cooking School weekly topics:  Adding Flavor- Sodium-Free  Fast and Healthy Breakfasts  Powerhouse Plant-Based Proteins  Satisfying Salads and Dressings  Simple Sides and  Sauces  International Cuisine-Spotlight on the [employer] Zones  Delicious Desserts  Savory Soups  [employer] - Meals in a Astronomer Appetizers and Snacks  Comforting Weekend Breakfasts  One-Pot Wonders   Fast Evening Meals  Landscape architect Your Pritikin Plate  WORKSHOPS   Healthy Mindset (Psychosocial):  Focused Goals, Sustainable Changes Clinical staff led group instruction and group discussion with PowerPoint presentation and patient guidebook. To enhance the learning environment the use of posters, models and videos may be added. Patients will be able to apply effective goal setting strategies to establish at least one personal goal, and then take consistent, meaningful action toward that goal.  They will learn to identify common barriers to achieving personal goals and develop strategies to overcome them. Patients will also gain an understanding of how our mind-set can impact our ability to achieve goals and the importance of cultivating a positive and growth-oriented mind-set. The purpose of this lesson is to provide patients with a deeper understanding of how to set and achieve personal goals, as well as the tools and strategies needed to overcome common obstacles which may arise along the way.  From Head to Heart: The Power of a Healthy Outlook  Clinical staff led group instruction and group discussion with PowerPoint presentation and patient guidebook. To enhance the learning environment the use of posters, models and videos may be added. Patients will be able to recognize and describe the impact of emotions and mood on physical health. They will discover the importance of self-care and explore self-care practices which may work for them. Patients will also learn how to utilize the 4 C's to cultivate a healthier outlook and better manage stress and challenges. The purpose of this lesson is to demonstrate to patients how a healthy outlook is an essential part of  maintaining good health, especially as they continue their cardiac rehab journey.  Healthy Sleep for a Healthy Heart Clinical staff led group instruction and group discussion with PowerPoint presentation and patient guidebook. To enhance the learning environment the use of posters, models and videos may be added. At the conclusion of this workshop, patients will be able to demonstrate knowledge of the importance of sleep to overall health, well-being, and quality of life. They will understand the symptoms of, and treatments for, common sleep disorders. Patients will also be able to identify daytime and nighttime behaviors which impact sleep, and they will be able to apply these tools to help manage sleep-related challenges. The purpose of this lesson is to provide patients with a general overview of sleep and outline the importance of quality sleep. Patients will learn about a few of the most common sleep disorders. Patients will also be introduced to the concept of "sleep hygiene," and discover ways to self-manage certain sleeping problems through simple daily behavior changes. Finally, the workshop will motivate patients by clarifying the links between quality sleep and their goals of heart-healthy living.   Recognizing and Reducing Stress Clinical staff led group instruction and group discussion with PowerPoint presentation and patient guidebook. To enhance the learning environment the use of posters, models and videos may be added. At the conclusion of this workshop, patients will be able to understand the types of stress reactions, differentiate between acute and chronic stress, and recognize the impact that chronic stress has on their health. They will also be able to apply different coping mechanisms, such as reframing negative self-talk. Patients will have the opportunity to practice a variety of stress management techniques, such as deep abdominal breathing, progressive muscle relaxation, and/or  guided imagery.  The purpose of this lesson is to educate patients on the role of stress in their lives and to provide healthy techniques for coping with it.  Learning Barriers/Preferences:  Learning Barriers/Preferences - 05/14/22 0944       Learning Barriers/Preferences   Learning Barriers Sight;Hearing   wears glasses/ hearing aids   Learning Preferences Computer/Internet;Pictoral;Written Material             Education Topics:  Knowledge Questionnaire Score:  Knowledge Questionnaire Score - 05/14/22 0945       Knowledge Questionnaire Score   Pre Score 24/24  Core Components/Risk Factors/Patient Goals at Admission:  Personal Goals and Risk Factors at Admission - 05/14/22 0946       Core Components/Risk Factors/Patient Goals on Admission    Weight Management Weight Maintenance;Yes    Intervention Weight Management: Develop a combined nutrition and exercise program designed to reach desired caloric intake, while maintaining appropriate intake of nutrient and fiber, sodium and fats, and appropriate energy expenditure required for the weight goal.;Weight Management: Provide education and appropriate resources to help participant work on and attain dietary goals.    Expected Outcomes Short Term: Continue to assess and modify interventions until short term weight is achieved;Long Term: Adherence to nutrition and physical activity/exercise program aimed toward attainment of established weight goal;Weight Maintenance: Understanding of the daily nutrition guidelines, which includes 25-35% calories from fat, 7% or less cal from saturated fats, less than 200mg  cholesterol, less than 1.5gm of sodium, & 5 or more servings of fruits and vegetables daily;Understanding recommendations for meals to include 15-35% energy as protein, 25-35% energy from fat, 35-60% energy from carbohydrates, less than 200mg  of dietary cholesterol, 20-35 gm of total fiber daily;Understanding of  distribution of calorie intake throughout the day with the consumption of 4-5 meals/snacks    Diabetes Yes    Intervention Provide education about signs/symptoms and action to take for hypo/hyperglycemia.;Provide education about proper nutrition, including hydration, and aerobic/resistive exercise prescription along with prescribed medications to achieve blood glucose in normal ranges: Fasting glucose 65-99 mg/dL    Expected Outcomes Short Term: Participant verbalizes understanding of the signs/symptoms and immediate care of hyper/hypoglycemia, proper foot care and importance of medication, aerobic/resistive exercise and nutrition plan for blood glucose control.;Long Term: Attainment of HbA1C < 7%.    Lipids Yes    Intervention Provide education and support for participant on nutrition & aerobic/resistive exercise along with prescribed medications to achieve LDL 70mg , HDL >40mg .    Expected Outcomes Short Term: Participant states understanding of desired cholesterol values and is compliant with medications prescribed. Participant is following exercise prescription and nutrition guidelines.;Long Term: Cholesterol controlled with medications as prescribed, with individualized exercise RX and with personalized nutrition plan. Value goals: LDL < 70mg , HDL > 40 mg.             Core Components/Risk Factors/Patient Goals Review:   Goals and Risk Factor Review     Row Name 05/20/22 0742 06/03/22 1632 06/26/22 1320 07/22/22 1423       Core Components/Risk Factors/Patient Goals Review   Personal Goals Review Weight Management/Obesity;Lipids;Diabetes Weight Management/Obesity;Lipids;Diabetes Weight Management/Obesity;Lipids;Diabetes Weight Management/Obesity;Lipids;Diabetes    Review [place] started intesive cardiac rehab on 05/19/22 and did well with exercise. Vital signs WNL. CBG 136 pre, post exercise CBG 86. Patient was given orange juice. Ate Bran flakes for breakfast ask to add protein Steven Lawrence started  intesive cardiac rehab on 05/19/22 and is off to a good start to exercise. Vital signs and CBG's have been Lawrence Steven Lawrence is doing well with exercise at  intensive cardiac rehab  exercise. Vital signs and CBG's have been Lawrence Steven Lawrence is doing well with exercise at  intensive cardiac rehab  exercise. Vital signs and CBG's have been Lawrence. Steven Lawrence will complete intensive cardiac rehab on 07/25/22    Expected Outcomes Steven Lawrence will continue to participate in intensive cardiac rehab for exercise, nutrition and lifestyle modifications Steven Lawrence will continue to participate in intensive cardiac rehab for exercise, nutrition and lifestyle modifications Steven Lawrence will continue to participate in intensive cardiac rehab for exercise, nutrition and lifestyle modifications Steven Lawrence will  continue to participate in intensive cardiac rehab for exercise, nutrition and lifestyle modifications             Core Components/Risk Factors/Patient Goals at Discharge (Final Review):   Goals and Risk Factor Review - 07/22/22 1423       Core Components/Risk Factors/Patient Goals Review   Personal Goals Review Weight Management/Obesity;Lipids;Diabetes    Review Steven Lawrence is doing well with exercise at  intensive cardiac rehab  exercise. Vital signs and CBG's have been Lawrence. Steven Lawrence will complete intensive cardiac rehab on 07/25/22    Expected Outcomes Steven Lawrence will continue to participate in intensive cardiac rehab for exercise, nutrition and lifestyle modifications             ITP Comments:  ITP Comments     Row Name 05/14/22 1610 05/20/22 0739 06/03/22 1628 06/26/22 1317 07/22/22 1420   ITP Comments Dr. Armanda Magic medical director. Introduction to pritikin education program/ intensive cardiac rehab. Initial orientation packet reviewed with patient. 30 Day ITP Review. Eitan started intesive cardiac rehab on 05/19/22 and did well with exercise 30 Day ITP Review. Steven Lawrence has good attendance and participation in intensive cardiac rehab. Zymiere is off to a  good start to exercise 30 Day ITP Review. Steven Lawrence has good attendance and participation in intensive cardiac rehab. 30 Day ITP Review. Claudis has good attendance and participation in intensive cardiac rehab. Jerrik will complete intensive cardiac rehab on 06/25/22.            Comments: See ITP Comments

## 2022-07-23 ENCOUNTER — Encounter (HOSPITAL_COMMUNITY)
Admission: RE | Admit: 2022-07-23 | Discharge: 2022-07-23 | Disposition: A | Discharge status: Home / Self Care | Payer: Medicare Other | Source: Ambulatory Visit | Attending: Cardiology | Admitting: Cardiology

## 2022-07-23 DIAGNOSIS — Z955 Presence of coronary angioplasty implant and graft: Secondary | ICD-10-CM | POA: Diagnosis not present

## 2022-07-25 ENCOUNTER — Encounter (HOSPITAL_COMMUNITY)
Admission: RE | Admit: 2022-07-25 | Discharge: 2022-07-25 | Disposition: A | Discharge status: Home / Self Care | Payer: Medicare Other | Source: Ambulatory Visit | Attending: Cardiology | Admitting: Cardiology

## 2022-07-25 VITALS — BP 114/52 | HR 67 | Ht 69.25 in | Wt 164.7 lb

## 2022-07-25 DIAGNOSIS — Z955 Presence of coronary angioplasty implant and graft: Secondary | ICD-10-CM | POA: Diagnosis not present

## 2022-07-25 NOTE — Progress Notes (Signed)
Discharge Progress Report  Patient Details  Name: WALFRED FARSON MRN: [id number] Date of Birth: Aug 04, 1950 Referring Provider:   Flowsheet Row INTENSIVE CARDIAC REHAB ORIENT from 05/14/2022 in [facility]  Referring Provider Olga Millers, MD        Number of Visits: 24  Reason for Discharge:  Patient reached a stable level of exercise. Patient independent in their exercise. Patient has met program and personal goals.  Smoking History:  Social History   Tobacco Use  Smoking Status Former   Packs/day: 1.00   Years: 53.00   Additional pack years: 0.00   Total pack years: 53.00   Types: Cigarettes   Quit date: 12/16/2019   Years since quitting: 2.6  Smokeless Tobacco Never    Diagnosis:  Status post coronary artery stent placement  ADL UCSD:   Initial Exercise Prescription:  Initial Exercise Prescription - 05/14/22 0900       Date of Initial Exercise RX and Referring Provider   Date 05/14/22    Referring Provider Olga Millers, MD    Expected Discharge Date 07/25/22      Treadmill   MPH 2.5    Grade 0    Minutes 15    METs 3.5      Arm Ergometer   Level 2    RPM 40    Minutes 15    METs 3.5      Prescription Details   Frequency (times per week) 3    Duration Progress to 30 minutes of continuous aerobic without signs/symptoms of physical distress      Intensity   THRR 40-80% of Max Heartrate 59-119    Ratings of Perceived Exertion 11-13    Perceived Dyspnea 0-4      Progression   Progression Continue progressive overload as per policy without signs/symptoms or physical distress.      Resistance Training   Training Prescription Yes    Weight 3    Reps 10-15             Discharge Exercise Prescription (Final Exercise Prescription Changes):  Exercise Prescription Changes - 07/25/22 1022       Response to Exercise   Blood Pressure (Admit) 114/52    Blood Pressure (Exercise)  138/70    Blood Pressure (Exit) 104/52    Heart Rate (Admit) 67 bpm    Heart Rate (Exercise) 129 bpm    Heart Rate (Exit) 81 bpm    Rating of Perceived Exertion (Exercise) 12    Symptoms None    Comments Patient completed the cardiac rehab program today.    Duration Continue with 30 min of aerobic exercise without signs/symptoms of physical distress.    Intensity THRR unchanged      Progression   Progression Continue to progress workloads to maintain intensity without signs/symptoms of physical distress.    Average METs 3.6      Resistance Training   Training Prescription Yes    Weight 4 lbs    Reps 10-15    Time 10 Minutes      Interval Training   Interval Training No      Treadmill   MPH 3.2    Grade 1    Minutes 15    METs 3.89      Arm Ergometer   Level 3    Watts 24    RPM 64    Minutes 15    METs 3.3  Home Exercise Plan   Plans to continue exercise at [facility] (comment)   Walking and weight training at gym.   Frequency Add 3 additional days to program exercise sessions.    Initial Home Exercises Provided 06/09/22             Functional Capacity:  6 Minute Walk     Row Name 05/14/22 0949 07/21/22 1205       6 Minute Walk   Phase Initial Discharge    Distance 1829 feet 2016 feet    Distance % Change -- 10.2 %    Distance Feet Change -- 187 ft    Walk Time 6 minutes 6 minutes    # of Rest Breaks 0 0    MPH 3.46 3.8    METS 4.39 4.9    RPE 12 12    Perceived Dyspnea  0 0    VO2 Peak 15.35 17.14    Symptoms Yes (comment) Yes (comment)    Comments a little winded, "feels like i worked hard" Pt reported back spasms during test, spasms resolved after rest.    Resting HR 75 bpm 63 bpm    Resting BP 102/56 102/54    Resting Oxygen Saturation  95 % --    Exercise Oxygen Saturation  during 6 min walk 95 % --    Max Ex. HR 125 bpm  advised to slow down to target 139 bpm    Max Ex. BP 144/62 148/56    2 Minute Post BP 120/64 --              Psychological, QOL, Others - Outcomes: PHQ 2/9:    07/23/2022   10:44 AM 05/14/2022    9:41 AM 05/10/2020    1:22 PM 03/13/2020    9:15 AM  Depression screen PHQ 2/9  Decreased Interest 0 0 0 0  Down, Depressed, Hopeless 0 1 0 0  PHQ - 2 Score 0 1 0 0  Altered sleeping 0 0    Tired, decreased energy 0 1    Change in appetite 0 0    Feeling bad or failure about yourself  0 0    Trouble concentrating 0 0    Moving slowly or fidgety/restless 0 0    Suicidal thoughts 0 0    PHQ-9 Score 0 2    Difficult doing work/chores Not difficult at all Not difficult at all      Quality of Life:  Quality of Life - 07/25/22 1008       Quality of Life   Select Quality of Life      Quality of Life Scores   Health/Function Pre 25.7 %    Health/Function Post 26 %    Health/Function % Change 1.17 %    Socioeconomic Pre 28.07 %    Socioeconomic Post 26.13 %    Socioeconomic % Change  -6.91 %    Psych/Spiritual Pre 27.93 %    Psych/Spiritual Post 25.36 %    Psych/Spiritual % Change -9.2 %    Family Pre 28.8 %    Family Post 28.8 %    Family % Change 0 %    GLOBAL Pre 27.1 %    GLOBAL Post 26.3 %    GLOBAL % Change -2.95 %             Personal Goals: Goals established at orientation with interventions provided to work toward goal.  Personal Goals and Risk Factors at Admission - 05/14/22 4098  Core Components/Risk Factors/Patient Goals on Admission    Weight Management Weight Maintenance;Yes    Intervention Weight Management: Develop a combined nutrition and exercise program designed to reach desired caloric intake, while maintaining appropriate intake of nutrient and fiber, sodium and fats, and appropriate energy expenditure required for the weight goal.;Weight Management: Provide education and appropriate resources to help participant work on and attain dietary goals.    Expected Outcomes Short Term: Continue to assess and modify interventions until short term weight  is achieved;Long Term: Adherence to nutrition and physical activity/exercise program aimed toward attainment of established weight goal;Weight Maintenance: Understanding of the daily nutrition guidelines, which includes 25-35% calories from fat, 7% or less cal from saturated fats, less than 200mg  cholesterol, less than 1.5gm of sodium, & 5 or more servings of fruits and vegetables daily;Understanding recommendations for meals to include 15-35% energy as protein, 25-35% energy from fat, 35-60% energy from carbohydrates, less than 200mg  of dietary cholesterol, 20-35 gm of total fiber daily;Understanding of distribution of calorie intake throughout the day with the consumption of 4-5 meals/snacks    Diabetes Yes    Intervention Provide education about signs/symptoms and action to take for hypo/hyperglycemia.;Provide education about proper nutrition, including hydration, and aerobic/resistive exercise prescription along with prescribed medications to achieve blood glucose in normal ranges: Fasting glucose 65-99 mg/dL    Expected Outcomes Short Term: Participant verbalizes understanding of the signs/symptoms and immediate care of hyper/hypoglycemia, proper foot care and importance of medication, aerobic/resistive exercise and nutrition plan for blood glucose control.;Long Term: Attainment of HbA1C < 7%.    Lipids Yes    Intervention Provide education and support for participant on nutrition & aerobic/resistive exercise along with prescribed medications to achieve LDL 70mg , HDL >40mg .    Expected Outcomes Short Term: Participant states understanding of desired cholesterol values and is compliant with medications prescribed. Participant is following exercise prescription and nutrition guidelines.;Long Term: Cholesterol controlled with medications as prescribed, with individualized exercise RX and with personalized nutrition plan. Value goals: LDL < 70mg , HDL > 40 mg.              Personal Goals Discharge:   Goals and Risk Factor Review     Row Name 05/20/22 0742 06/03/22 1632 06/26/22 1320 07/22/22 1423       Core Components/Risk Factors/Patient Goals Review   Personal Goals Review Weight Management/Obesity;Lipids;Diabetes Weight Management/Obesity;Lipids;Diabetes Weight Management/Obesity;Lipids;Diabetes Weight Management/Obesity;Lipids;Diabetes    Review Gurvir started intesive cardiac rehab on 05/19/22 and did well with exercise. Vital signs WNL. CBG 136 pre, post exercise CBG 86. Patient was given orange juice. Ate Bran flakes for breakfast ask to add protein Briston started intesive cardiac rehab on 05/19/22 and is off to a good start to exercise. Vital signs and CBG's have been stable Rayshaun is doing well with exercise at  intensive cardiac rehab  exercise. Vital signs and CBG's have been stable Marcangelo is doing well with exercise at  intensive cardiac rehab  exercise. Vital signs and CBG's have been stable. Tabitha will complete intensive cardiac rehab on 07/25/22    Expected Outcomes Jasiel will continue to participate in intensive cardiac rehab for exercise, nutrition and lifestyle modifications Jamad will continue to participate in intensive cardiac rehab for exercise, nutrition and lifestyle modifications Rogie will continue to participate in intensive cardiac rehab for exercise, nutrition and lifestyle modifications Trenden will continue to participate in intensive cardiac rehab for exercise, nutrition and lifestyle modifications  Exercise Goals and Review:  Exercise Goals     Row Name 05/14/22 0955             Exercise Goals   Increase Physical Activity Yes       Intervention Provide advice, education, support and counseling about physical activity/exercise needs.;Develop an individualized exercise prescription for aerobic and resistive training based on initial evaluation findings, risk stratification, comorbidities and participant's personal goals.       Expected Outcomes Short Term:  Attend rehab on a regular basis to increase amount of physical activity.;Long Term: Exercising regularly at least 3-5 days a week.;Long Term: Add in home exercise to make exercise part of routine and to increase amount of physical activity.       Increase Strength and Stamina Yes       Intervention Provide advice, education, support and counseling about physical activity/exercise needs.;Develop an individualized exercise prescription for aerobic and resistive training based on initial evaluation findings, risk stratification, comorbidities and participant's personal goals.       Expected Outcomes Short Term: Increase workloads from initial exercise prescription for resistance, speed, and METs.;Short Term: Perform resistance training exercises routinely during rehab and add in resistance training at home;Long Term: Improve cardiorespiratory fitness, muscular endurance and strength as measured by increased METs and functional capacity ( )       Able to understand and use rate of perceived exertion (RPE) scale Yes       Intervention Provide education and explanation on how to use RPE scale       Expected Outcomes Short Term: Able to use RPE daily in rehab to express subjective intensity level;Long Term:  Able to use RPE to guide intensity level when exercising independently       Knowledge and understanding of Target Heart Rate Range (THRR) Yes       Intervention Provide education and explanation of THRR including how the numbers were predicted and where they are located for reference       Expected Outcomes Short Term: Able to state/look up THRR;Long Term: Able to use THRR to govern intensity when exercising independently;Short Term: Able to use daily as guideline for intensity in rehab       Understanding of Exercise Prescription Yes       Intervention Provide education, explanation, and written materials on patient's individual exercise prescription       Expected Outcomes Short Term: Able to explain  program exercise prescription;Long Term: Able to explain home exercise prescription to exercise independently                Exercise Goals Re-Evaluation:  Exercise Goals Re-Evaluation     Row Name 05/19/22 1127 05/30/22 1044 06/09/22 1057 06/18/22 1052 06/30/22 1117     Exercise Goal Re-Evaluation   Exercise Goals Review Increase Physical Activity;Able to understand and use rate of perceived exertion (RPE) scale Increase Physical Activity;Able to understand and use rate of perceived exertion (RPE) scale Increase Physical Activity;Able to understand and use rate of perceived exertion (RPE) scale;Increase Strength and Stamina;Knowledge and understanding of Target Heart Rate Range (THRR);Able to check pulse independently;Understanding of Exercise Prescription Increase Physical Activity;Able to understand and use rate of perceived exertion (RPE) scale;Increase Strength and Stamina;Knowledge and understanding of Target Heart Rate Range (THRR);Able to check pulse independently;Understanding of Exercise Prescription Increase Physical Activity;Able to understand and use rate of perceived exertion (RPE) scale;Increase Strength and Stamina;Knowledge and understanding of Target Heart Rate Range (THRR);Able to check pulse independently;Understanding of Exercise Prescription  Comments Patient able to understand and use RPE scale appropriately. Patient feels he's doing well with exercise. His goal is to improve his nutritional habits. Patient may be having carpal tunnel surgery next Tuesday will modify equipment to avoid hand grips. Reviewed exercise prescription with patient. Patient is walking at home and weight training at the gym as his mode of exercise. Patient plans to continue exercise routine at the gym upon completion of the cardiac rehab program. Patient has a FitBit that he can use to monitor his pulse. Patient is interested in jogging. Sent request to patient's cardiologist along with request to  increase THRR. Patient aware of clearance to jog and new THRR of 59-134. Patient is currently walking 30 minutes most days of the week and has s FitBit he can use to monitor his pulse. Patient will begin jogging as part of his home exercise routine, walking for 2 minutes and a light jog for 30 seconds for 15 minutes of his walk time.   Expected Outcomes Progress workloads as tolerated to help increase strength and stamina. Patient will attend the Nutrition classes and meet with the dietitan to discuss healthy eat plan. Patient will continue walking and weights at the gym in addition to exercise at cardiac rehab to build a consistent exercise routine and to build upper body strength. Patient will begin jogging when/if cleared by cardiologist to do so. Patient will begin light jogging at home as part of his home exercise routine.    Row Name 07/07/22 1137 07/25/22 1130           Exercise Goal Re-Evaluation   Exercise Goals Review Increase Physical Activity;Able to understand and use rate of perceived exertion (RPE) scale;Increase Strength and Stamina;Knowledge and understanding of Target Heart Rate Range (THRR);Able to check pulse independently;Understanding of Exercise Prescription Increase Physical Activity;Able to understand and use rate of perceived exertion (RPE) scale;Increase Strength and Stamina;Knowledge and understanding of Target Heart Rate Range (THRR);Able to check pulse independently;Understanding of Exercise Prescription      Comments Patient says he started jogging at home a couple of times last week. He did intervals walking/ jogging for 40 minutes and was able to stay within new THRR except going up hills. Patient completed the cardiac rehab program and progressed well achieving 3.7 METs with exercise. Patient will continue walking/ jogging at home and exercise at the gym.      Expected Outcomes Patient will continue interval jogging at home. Patient will exercise at least 30 minutes, 5-7  days/week to maintain health and fitness gains.               Nutrition & Weight - Outcomes:  Pre Biometrics - 05/14/22 0940       Pre Biometrics   Waist Circumference 37.5 inches    Hip Circumference 39 inches    Waist to Hip Ratio 0.96 %    Triceps Skinfold 20 mm    % Body Fat 26.2 %    Grip Strength 34 kg    Flexibility 15.25 in    Single Leg Stand 6.12 seconds             Post Biometrics - 07/25/22 1009        Post  Biometrics   Height 5' 9.25" (1.759 m)    Waist Circumference 38 inches    Hip Circumference 39 inches    Waist to Hip Ratio 0.97 %    Triceps Skinfold 12 mm    % Body Fat 24.4 %   %  change of -6.87   Grip Strength 32 kg   % change of -5.9   Flexibility 16 in   % change of +4.9   Single Leg Stand 4.12 seconds             Nutrition:  Nutrition Therapy & Goals - 07/16/22 0851       Nutrition Therapy   Diet Heart Healthy/Carbohydrate Consistent Diet    Drug/Food Interactions Statins/Certain Fruits      Personal Nutrition Goals   Nutrition Goal Patient to identify strategies for reducing cardiovascular risk by attending the weekly Pritikin education and nutrition series    Personal Goal #2 Patient to improve diet quality by using the plate method as a daily guide for meal planning to include lean protein/plant protein, fruits, vegetables, whole grains, nonfat dairy as part of well balanced diet    Personal Goal #3 Patient to reduce sodium to 1500mg  per day    Personal Goal #4 Patient to identify strategies for blood sugar control with goal A1c <7%    Comments Goals in action. Ballard continues to attend the Pritikin education and nutrition series regularly and completed one on one nutrition counseling with his wife. He has good nutrition knowledge of heart healthy diet including increased fiber, decreased saturated fat, and reduced sodium intake. He continues to work on blood sugar control and reports improved am/fasting blood sugars. He has  adopted more plant based eating and increased dietary fiber significantly. Bostyn will continue to benefit from participation in intensive cardiac rehab for nutrition, exercise, and lifestyle modificaton.      Intervention Plan   Intervention Prescribe, educate and counsel regarding individualized specific dietary modifications aiming towards targeted core components such as weight, hypertension, lipid management, diabetes, heart failure and other comorbidities.;Nutrition handout(s) given to patient.    Expected Outcomes Short Term Goal: Understand basic principles of dietary content, such as calories, fat, sodium, cholesterol and nutrients.;Long Term Goal: Adherence to prescribed nutrition plan.             Nutrition Discharge:  Nutrition Assessments - 07/28/22 0939       Rate Your Plate Scores   Post Score 82             Education Questionnaire Score:  Knowledge Questionnaire Score - 07/25/22 1009       Knowledge Questionnaire Score   Pre Score 24/24    Post Score 24/24             Goals reviewed with patient; copy given to patient.Pt graduates from  Intensive/Traditional cardiac rehab program today with completion of  49 exercise and education sessions. Pt maintained good attendance and progressed nicely during their participation in rehab as evidenced by increased MET level. Channing increased his distance on his post exercise walk test by 187 feet. We are proud of Haitham's progress!   Medication list reconciled. Repeat  PHQ score- 0  .  Pt has made significant lifestyle changes and should be commended for their success. Shemar achieved their goals during cardiac rehab.   Pt plans to continue exercise at home by walking as [place] park. Thayer Headings RN BSN

## 2022-07-31 DIAGNOSIS — L814 Other melanin hyperpigmentation: Secondary | ICD-10-CM | POA: Diagnosis not present

## 2022-07-31 DIAGNOSIS — Z86018 Personal history of other benign neoplasm: Secondary | ICD-10-CM | POA: Diagnosis not present

## 2022-07-31 DIAGNOSIS — L570 Actinic keratosis: Secondary | ICD-10-CM | POA: Diagnosis not present

## 2022-07-31 DIAGNOSIS — L821 Other seborrheic keratosis: Secondary | ICD-10-CM | POA: Diagnosis not present

## 2022-07-31 DIAGNOSIS — D225 Melanocytic nevi of trunk: Secondary | ICD-10-CM | POA: Diagnosis not present

## 2022-07-31 DIAGNOSIS — L578 Other skin changes due to chronic exposure to nonionizing radiation: Secondary | ICD-10-CM | POA: Diagnosis not present

## 2022-08-13 ENCOUNTER — Ambulatory Visit: Payer: Medicare Other | Admitting: Physician Assistant

## 2022-08-13 ENCOUNTER — Encounter: Payer: Self-pay | Admitting: Nurse Practitioner

## 2022-08-13 ENCOUNTER — Ambulatory Visit: Payer: Medicare Other | Attending: Physician Assistant | Admitting: Nurse Practitioner

## 2022-08-13 VITALS — BP 96/52 | HR 78 | Ht 69.0 in | Wt 164.6 lb

## 2022-08-13 DIAGNOSIS — Z794 Long term (current) use of insulin: Secondary | ICD-10-CM | POA: Diagnosis not present

## 2022-08-13 DIAGNOSIS — I2510 Atherosclerotic heart disease of native coronary artery without angina pectoris: Secondary | ICD-10-CM | POA: Insufficient documentation

## 2022-08-13 DIAGNOSIS — I9789 Other postprocedural complications and disorders of the circulatory system, not elsewhere classified: Secondary | ICD-10-CM | POA: Insufficient documentation

## 2022-08-13 DIAGNOSIS — I959 Hypotension, unspecified: Secondary | ICD-10-CM | POA: Diagnosis not present

## 2022-08-13 DIAGNOSIS — E785 Hyperlipidemia, unspecified: Secondary | ICD-10-CM | POA: Diagnosis not present

## 2022-08-13 DIAGNOSIS — E119 Type 2 diabetes mellitus without complications: Secondary | ICD-10-CM | POA: Insufficient documentation

## 2022-08-13 DIAGNOSIS — G4733 Obstructive sleep apnea (adult) (pediatric): Secondary | ICD-10-CM | POA: Diagnosis not present

## 2022-08-13 DIAGNOSIS — I350 Nonrheumatic aortic (valve) stenosis: Secondary | ICD-10-CM | POA: Diagnosis not present

## 2022-08-13 DIAGNOSIS — I4891 Unspecified atrial fibrillation: Secondary | ICD-10-CM | POA: Diagnosis not present

## 2022-08-13 NOTE — Progress Notes (Signed)
Office Visit    Patient Name: Steven Lawrence Date of Encounter: 08/13/2022  Primary Care Provider:  Milus Height, PA Primary Cardiologist:  Olga Millers, MD  Chief Complaint    72 year old male with a history of CAD s/p CABG x5 (LIMA-LAD, SVG-D1, sequential SVG-OM 2/OM 3 and SVG-PDA) with subsequent PCI/DES-SVG-PDA, SVG-Diagonal, aortic stenosis s/p AVR in 2021, hyperlipidemia, OSA, type 2 diabetes, and tobacco use who presents for follow-up related to CAD.   Past Medical History    Past Medical History:  Diagnosis Date   Acid reflux    Arthritis    BPH (benign prostatic hyperplasia)    mild   CAD in native artery    a. NSTEMI 12/2019 s/p CABGx5 with bovine AVR.   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, RDI 10.2/hr ([name])   Urinary frequency    Wears glasses    Wears hearing aid    both ears   Past Surgical History:  Procedure Laterality Date   ANTERIOR LAT LUMBAR FUSION Left 01/04/2015   Procedure: ANTERIOR LATERAL LUMBAR FUSION 1 LEVEL;  Surgeon: Estill Bamberg, MD;  Location: [facility] OR;  Service: Orthopedics;  Laterality: Left;  Left sided lateral interbody fusion lumbar 3-4 with instrumentation and allograft   AORTIC VALVE REPLACEMENT N/A 12/21/2019   Procedure: AORTIC VALVE REPLACEMENT WITH INSPIRIS RESILIA AORTIC VALVE SIZE ;  Surgeon: Loreli Slot, MD;  Location: [facility] OR;  Service: Open Heart Surgery;  Laterality: N/A;   COLONOSCOPY     CORONARY ARTERY BYPASS GRAFT N/A 12/21/2019   Procedure: CORONARY ARTERY BYPASS GRAFTING TIMES FIVE USING LEFT INTERNAL [employer] ARTERY AND ENDOSCOPICALLY HARVESTED RIGHT GREATER  SAPHENOUS VEIN.;  Surgeon: Loreli Slot, MD;  Location: [facility] OR;  Service: Open Heart Surgery;  Laterality: N/A;   CORONARY STENT INTERVENTION N/A 04/11/2022   Procedure: CORONARY STENT INTERVENTION;  Surgeon: Tonny Bollman, MD;  Location: [facility] INVASIVE CV LAB;  Service: Cardiovascular;  Laterality: N/A;   ENDOVEIN HARVEST OF GREATER SAPHENOUS VEIN  12/21/2019   Procedure: ENDOVEIN HARVEST OF RIGHT GREATER SAPHENOUS VEIN;  Surgeon: Loreli Slot, MD;  Location: [facility] OR;  Service: Open Heart Surgery;;   ESOPHAGOGASTRODUODENOSCOPY     INGUINAL HERNIA REPAIR Right 08/12/2012   Procedure: HERNIA REPAIR INGUINAL ADULT;  Surgeon: Wilmon Arms. Corliss Skains, MD;  Location: [facility];  Service: General;  Laterality: Right;   INSERTION OF MESH Right 08/12/2012   Procedure: INSERTION OF MESH;  Surgeon: Wilmon Arms. Corliss Skains, MD;  Location: [facility];  Service: General;  Laterality: Right;   LEFT HEART CATH AND CORONARY ANGIOGRAPHY N/A 12/19/2019   Procedure: LEFT HEART CATH AND CORONARY ANGIOGRAPHY;  Surgeon: Runell Gess, MD;  Location: [facility] INVASIVE CV LAB;  Service: Cardiovascular;  Laterality: N/A;   LEFT HEART CATH AND CORS/GRAFTS ANGIOGRAPHY N/A 04/11/2022   Procedure: LEFT HEART CATH AND CORS/GRAFTS ANGIOGRAPHY;  Surgeon: Tonny Bollman, MD;  Location: [facility] INVASIVE CV LAB;  Service: Cardiovascular;  Laterality: N/A;   TEE WITHOUT CARDIOVERSION N/A 12/21/2019   Procedure: TRANSESOPHAGEAL ECHOCARDIOGRAM (TEE);  Surgeon: Loreli Slot, MD;  Location: [facility] OR;  Service: Open Heart Surgery;  Laterality: N/A;   TONSILLECTOMY     VASECTOMY  1985    Allergies  No Known Allergies   Labs/Other Studies Reviewed    The following studies were reviewed today:  Cardiac Studies & Procedures   CARDIAC CATHETERIZATION  CARDIAC CATHETERIZATION 04/11/2022  Narrative   1st Diag lesion is 100% stenosed.   Origin to Prox Graft lesion before 1st Mrg  is 100% stenosed.   Seq  SVG- OM1 and OM2.  Severe multivessel CAD with severe LAD stenosis, total occlusion of the LCx (new from prior cath when there was severe calcific stenosis), and total occlusion of the RCA (chronic) S/P CABG with continued patency of the LIMA-LAD without stenosis Total occlusion of the sequential graft to OM1 and OM2 Severe stenosis at the ostium of the SVG-diagonal, treated successfully with a 3.5x16 mm Synergy DES Severe stenosis at the ostium of the SVG-PDA, treated successfully with a 3.5x16 mm Synergy DES Likely normal function of the aortic bioprosthesis, crossed with J-wire, pseudo-gradient from catheter whip artifact  Recommend: Same day PCI protocol if criteria met. ASA/clopidogrel x 12 months favor long-term if tolerated  Findings Coronary Findings Diagnostic  Dominance: Right  Left Anterior Descending Mid LAD lesion is 80% stenosed.  First Diagonal Branch 1st Diag lesion is 100% stenosed.  Left Circumflex Prox Cx lesion is 100% stenosed.  Third Obtuse Marginal Branch Collaterals 3rd Mrg filled by collaterals from 1st Sept.  Right Coronary Artery Ost RCA to Prox RCA lesion is 100% stenosed.  Right Posterior Atrioventricular Artery Collaterals RPAV filled by collaterals from Prox Cx.  First Right Posterolateral Branch Collaterals 1st RPL filled by collaterals from Dist LAD.  LIMA Graft To Dist LAD Graft was visualized by angiography and is normal in caliber.  The graft exhibits no disease.  Graft To 1st Diag Origin to Prox Graft lesion is 90% stenosed. The lesion is calcified.  Graft To RPDA Origin lesion is 90% stenosed. Focal ostial lesion with severe pressure dampening  Sequential Jump Graft Graft To 1st Mrg, 2nd Mrg Seq SVG- OM1 and OM2.  Chronic occlusion at origin of graft Origin to Prox Graft lesion before 1st Mrg  is 100% stenosed.  Intervention  Origin to Prox Graft lesion (Graft To 1st Diag) Stent CATHETER LAUNCHER 66F MPST guide catheter was  inserted. Lesion crossed with guidewire using a WIRE COUGAR XT STRL 190CM. Pre-stent angioplasty was performed using a BALLN SAPPHIRE 3.0X12. Maximum pressure:  14 atm. A drug-eluting stent was successfully placed using a SYNERGY XD 3.50X16. Stent does not overlap previously placed stentPost-stent angioplasty was not performed. The lesion was resistant to balloon angioplasty but yielded at 14 atm. Post-Intervention Lesion Assessment The intervention was successful. Pre-interventional TIMI flow is 3. Post-intervention TIMI flow is 3. No complications occurred at this lesion. There is a 0% residual stenosis post intervention.  Origin lesion (Graft To RPDA) Stent CATHETER LAUNCHER 66F MPST guide catheter was inserted. Lesion crossed with guidewire using a WIRE COUGAR XT STRL 190CM. Pre-stent angioplasty was performed using a BALLN SAPPHIRE 3.0X12. A drug-eluting stent was successfully placed using a SYNERGY XD 3.50X16. Post-stent angioplasty was not performed. Stent covers ostium, TIMI-3 flow pre and post-PCI. Graft pre-treated with IC verapamil. Post-Intervention Lesion Assessment The intervention was successful. Pre-interventional TIMI flow is 3. Post-intervention TIMI flow is 3. No complications occurred at this lesion. There is a 0% residual stenosis post intervention.   CARDIAC CATHETERIZATION  CARDIAC CATHETERIZATION 12/19/2019  Narrative Images  from the original result were not included.   Ost RCA to Prox RCA lesion is 100% stenosed.  Prox Cx lesion is 95% stenosed.  Mid Cx lesion is 95% stenosed.  Mid LAD lesion is 80% stenosed.  ARSON LUNDWALL is a 72 y.o. male   [id number] LOCATION:  FACILITY: [facility] PHYSICIAN: Nanetta Batty, M.D. [birth year]   DATE OF PROCEDURE:  12/19/2019  DATE OF DISCHARGE:     CARDIAC CATHETERIZATION    History obtained from chart review.72 year old male with past medical history of diabetes mellitus, hyperlipidemia, restless leg syndrome, benign  prostatic hypertrophy, tobacco abuse admitted with non-ST elevation myocardial infarction.  Echocardiogram shows normal LV function, mild left ventricular hypertrophy, moderate aortic stenosis with mean gradient 22 mmHg.  Impression Mr. Holtzclaw has diffusely calcified vessels, three-vessel disease and preserved LV function.  He did not have a pullback gradient across the aortic valve.  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  I believe the best therapy would be complete revascularization with CABG.  The sheath was removed and a TR band was placed on the right wrist to achieve patent hemostasis.  The patient left lab in stable condition.  Heparin will be restarted 4 hours after sheath removal.  T CTS will be notified.  Nanetta Batty. MD, [facility] 12/19/2019 8:14 AM  Findings Coronary Findings Diagnostic  Dominance: Right  Left Anterior Descending Mid LAD lesion is 80% stenosed.  Left Circumflex Prox Cx lesion is 95% stenosed. Mid Cx lesion is 95% stenosed.  Right Coronary Artery Ost RCA to Prox RCA lesion is 100% stenosed.  Right Posterior Atrioventricular Artery Collaterals RPAV filled by collaterals from Prox Cx.  First Right Posterolateral Branch Collaterals 1st RPL filled by collaterals from Dist LAD.  Intervention  No interventions have been documented.   STRESS TESTS  NM PET CT CARDIAC PERFUSION MULTI W/ABSOLUTE BLOODFLOW 04/01/2022  Narrative   The study is high risk due to the presence of a large, reversible perfusion defect in the lateral and basal-to-mid inferior LV segments consistent with ischemia, the presence of TID, and a drop in EF with stress. Recommend coronary catheterization if clinically indicated.   LV perfusion is abnormal. There is evidence of ischemia. Defect 1: There is a medium defect with severe reduction in uptake present  in the apical to basal anterolateral and inferolateral location(s) that is reversible. There is abnormal wall motion in the defect area. Consistent with ischemia. Defect 2: There is a small defect present in the mid to basal inferior location(s). There is abnormal wall motion in the defect area. Consistent with ischemia.   Rest left ventricular function is normal. Rest EF: 58 %. EF decreased with stress to 50% consistent with high risk findings. End diastolic cavity size is normal. End systolic cavity size is normal.   Myocardial blood flow reserve is not reported in this patient due to history of CABG that affect accuracy. Of note, MBF is most abnormal in the LCx territory which corresponds with perfusion abnormality seen on imaging.   Coronary calcium assessment not performed due to prior revascularization.  CLINICAL DATA:  This over-read does not include interpretation of cardiac or coronary anatomy or pathology. The Cardiac PET CT interpretation by the cardiologist is attached.  COMPARISON:  Lung cancer screening CT dated November 06, 2021  FINDINGS: Vascular: Normal heart size. Severe coronary artery calcifications status post CABG. Normal caliber thoracic  aorta with moderate calcified plaque.  Mediastinum/Nodes: Esophagus is unremarkable. No pathologically enlarged lymph nodes seen in the chest.  Lungs/Pleura: Central airways are patent. No consolidation, pleural effusion or pneumothorax. No new or enlarging pulmonary nodules in the visualized portions of the lungs.  Upper Abdomen: No acute abnormality.  Musculoskeletal: Prior median sternotomy. No acute osseous abnormality.  IMPRESSION: 1. No acute findings in the chest. 2. Severe coronary artery calcifications status post CABG. 3. Aortic Atherosclerosis (ICD10-I70.0).   Electronically Signed By: Allegra Lai M.D. On: 04/01/2022 13:50   ECHOCARDIOGRAM  ECHOCARDIOGRAM COMPLETE 02/08/2020  Narrative ECHOCARDIOGRAM  REPORT    Patient Name:   Steven Lawrence  Date of Exam: 02/08/2020 Medical Rec #:  [id number]     Height:       69.0 in Accession #:    [id number]    Weight:       148.0 lb Date of Birth:  May 09, 1950     BSA:          1.818 m Patient Age:    69 years      BP:           132/73 mmHg Patient Gender: M             HR:           82 bpm. Exam Location:  [address]  Procedure: 2D Echo, Cardiac Doppler and Color Doppler  Indications:    I35.9* Aortic valve disease, unspecified  History:        Patient has prior history of Echocardiogram examinations, most recent 12/21/2019. Previous Myocardial Infarction, Prior CABG, Signs/Symptoms:Chest Pain; Risk Factors:Hypertension and Diabetes. Aortic stenosis. Aortic valve replacement. Obstructive sleep apnea.  Sonographer:    Cathie Beams RCS Referring Phys: [phone] TRACI R [name]  IMPRESSIONS   1. Left ventricular ejection fraction, by estimation, is 60 to 65%. The left ventricle has normal function. The left ventricle has no regional wall motion abnormalities. There is moderate left ventricular hypertrophy. Left ventricular diastolic parameters are consistent with Grade II diastolic dysfunction (pseudonormalization). Elevated left atrial pressure. 2. Right ventricular systolic function is normal. The right ventricular size is normal. Tricuspid regurgitation signal is inadequate for assessing PA pressure. 3. The mitral valve is normal in structure. Trivial mitral valve regurgitation. 4. There is a 23 mm Inspiris Resilia valve present in the aortic position. Aortic valve regurgitation is not visualized. MG 13 mmHg. EOA 1.6 cm^2. Echo findings are consistent with normal structure and function of the aortic valve prosthesis. 5. The inferior vena cava is normal in size with greater than 50% respiratory variability, suggesting right atrial pressure of 3 mmHg.  FINDINGS Left Ventricle: Left ventricular ejection fraction, by estimation, is 60 to 65%. The  left ventricle has normal function. The left ventricle has no regional wall motion abnormalities. The left ventricular internal cavity size was small. There is moderate left ventricular hypertrophy. Left ventricular diastolic parameters are consistent with Grade II diastolic dysfunction (pseudonormalization). Elevated left atrial pressure.  Right Ventricle: The right ventricular size is normal. Right vetricular wall thickness was not assessed. Right ventricular systolic function is normal. Tricuspid regurgitation signal is inadequate for assessing PA pressure.  Left Atrium: Left atrial size was normal in size.  Right Atrium: Right atrial size was normal in size.  Pericardium: There is no evidence of pericardial effusion.  Mitral Valve: The mitral valve is normal in structure. Trivial mitral valve regurgitation.  Tricuspid Valve: The tricuspid valve is normal in structure. Tricuspid valve regurgitation  is trivial.  Aortic Valve: The aortic valve has been repaired/replaced. Aortic valve regurgitation is not visualized. Aortic valve mean gradient measures 11.5 mmHg. Aortic valve peak gradient measures 23.2 mmHg. Aortic valve area, by VTI measures 1.66 cm. There is a 23 mm Inspiris Resilia valve present in the aortic position. Echo findings are consistent with normal structure and function of the aortic valve prosthesis.  Pulmonic Valve: The pulmonic valve was not well visualized. Pulmonic valve regurgitation is not visualized.  Aorta: The aortic root is normal in size and structure.  Venous: The inferior vena cava is normal in size with greater than 50% respiratory variability, suggesting right atrial pressure of 3 mmHg.  IAS/Shunts: No atrial level shunt detected by color flow Doppler.   LEFT VENTRICLE PLAX 2D LVIDd:         3.30 cm  Diastology LVIDs:         2.30 cm  LV e' medial:    6.09 cm/s LV PW:         1.50 cm  LV E/e' medial:  19.5 LV IVS:        1.40 cm  LV e' lateral:   7.29  cm/s LVOT diam:     2.00 cm  LV E/e' lateral: 16.3 LV SV:         71 LV SV Index:   39 LVOT Area:     3.14 cm   RIGHT VENTRICLE RV Basal diam:  2.50 cm RV S prime:     12.80 cm/s TAPSE (M-mode): 1.5 cm  LEFT ATRIUM             Index       RIGHT ATRIUM           Index LA diam:        3.30 cm 1.82 cm/m  RA Area:     11.10 cm LA Vol (A2C):   32.9 ml 18.10 ml/m RA Volume:   22.20 ml  12.21 ml/m LA Vol (A4C):   43.7 ml 24.04 ml/m LA Biplane Vol: 37.7 ml 20.74 ml/m AORTIC VALVE AV Area (Vmax):    1.53 cm AV Area (Vmean):   1.47 cm AV Area (VTI):     1.66 cm AV Vmax:           241.00 cm/s AV Vmean:          156.000 cm/s AV VTI:            0.426 m AV Peak Grad:      23.2 mmHg AV Mean Grad:      11.5 mmHg LVOT Vmax:         117.00 cm/s LVOT Vmean:        73.100 cm/s LVOT VTI:          0.225 m LVOT/AV VTI ratio: 0.53  MITRAL VALVE MV Area (PHT): 3.50 cm     SHUNTS MV Decel Time: 217 msec     Systemic VTI:  0.22 m MV E velocity: 119.00 cm/s  Systemic Diam: 2.00 cm MV A velocity: 105.00 cm/s MV E/A ratio:  1.13  Epifanio Lesches MD Electronically signed by Epifanio Lesches MD Signature Date/Time: 02/08/2020/7:13:53 PM    Final   TEE  ECHO INTRAOPERATIVE TEE 12/21/2019  Narrative *INTRAOPERATIVE TRANSESOPHAGEAL REPORT *    Patient Name:   [name]   Date of Exam: 12/21/2019 Medical Rec #:  [id number]      Height:       69.0 in Accession #:  [id number]     Weight:       144.7 lb Date of Birth:  1951-01-05      BSA:          1.80 m Patient Age:    69 years       BP:           125/75 mmHg Patient Gender: M              HR:           58 bpm. Exam Location:  Anesthesiology  Transesophogeal exam was perform intraoperatively during surgical procedure. Patient was closely monitored under general anesthesia during the entirety of examination.  Indications:     Aortic Stenosis; CAD Native Vessels Sonographer:     Thurman Coyer RDCS  (AE) Performing Phys: [id number] Salvatore Decent HENDRICKSON Diagnosing Phys: Gaynelle Adu MD  Complications: No known complications during this procedure. POST-OP IMPRESSIONS - Left Ventricle: The left ventricle is unchanged from pre-bypass. - Aortic Valve: A bioprosthetic valve was placed, leaflets are freely mobile and leaflets thin. There is no regurgitation. The gradient recorded across the prosthetic valve is within the expected range. - Mitral Valve: The mitral valve appears unchanged from pre-bypass. - Tricuspid Valve: The tricuspid valve appears unchanged from pre-bypass.  PRE-OP FINDINGS Left Ventricle: The left ventricle has normal systolic function, with an ejection fraction of 60-65%. The cavity size was normal. There is mildly increased left ventricular wall thickness.  Right Ventricle: The right ventricle has normal systolic function. The cavity was mildly enlarged. There is no increase in right ventricular wall thickness.  Left Atrium: Left atrial size was not assessed. The left atrial appendage is well visualized and there is no evidence of thrombus present.  Right Atrium: Right atrial size was not assessed.  Interatrial Septum: No atrial level shunt detected by color flow Doppler.  Pericardium: There is no evidence of pericardial effusion.  Mitral Valve: The mitral valve is normal in structure. Mitral valve regurgitation is not visualized by color flow Doppler.  Tricuspid Valve: The tricuspid valve was normal in structure. Tricuspid valve regurgitation was not visualized by color flow Doppler.  Aortic Valve: The aortic valve is tricuspid There is moderate thickening of the aortic valve and There is moderate calcification of the aortic valve Aortic valve regurgitation was not visualized by color flow Doppler. There is moderate stenosis of the aortic valve, with a calculated valve area of 1.07 cm.  Pulmonic Valve: The pulmonic valve was normal in structure. Pulmonic valve  regurgitation is not visualized by color flow Doppler.   +--------------+--------++ LEFT VENTRICLE         +--------------+--------++ PLAX 2D                +--------------+--------++ LVIDd:        4.30 cm  +--------------+--------++ LVIDs:        2.50 cm  +--------------+--------++ LVOT diam:    2.00 cm  +--------------+--------++ LV SV:        61 ml    +--------------+--------++ LV SV Index:  34.03    +--------------+--------++ LVOT Area:    3.14 cm +--------------+--------++                        +--------------+--------++  +------------------+------------++ AORTIC VALVE                   +------------------+------------++ AV Area (Vmax):   0.94 cm     +------------------+------------++ AV Area (Vmean):  0.90 cm     +------------------+------------++ AV Area (VTI):    1.07 cm     +------------------+------------++ AV Vmax:          269.00 cm/s  +------------------+------------++ AV Vmean:         168.500 cm/s +------------------+------------++ AV VTI:           0.554 m      +------------------+------------++ AV Peak Grad:     28.9 mmHg    +------------------+------------++ AV Mean Grad:     14.0 mmHg    +------------------+------------++ LVOT Vmax:        80.30 cm/s   +------------------+------------++ LVOT Vmean:       48.500 cm/s  +------------------+------------++ LVOT VTI:         0.189 m      +------------------+------------++ LVOT/AV VTI ratio:0.34         +------------------+------------++   +--------------+-------+ SHUNTS                +--------------+-------+ Systemic VTI: 0.19 m  +--------------+-------+ Systemic Diam:2.00 cm +--------------+-------+   Gaynelle Adu MD Electronically signed by Gaynelle Adu MD Signature Date/Time: 12/21/2019/3:57:24 PM    Final           Recent Labs: 04/07/2022: BUN 20; Creatinine, Ser 0.80;  Hemoglobin 12.7; Platelets 184; Potassium 5.0; Sodium 143  Recent Lipid Panel    Component Value Date/Time   CHOL 115 09/05/2020 0943   TRIG 48 09/05/2020 0943   HDL 55 09/05/2020 0943   CHOLHDL 2.1 09/05/2020 0943   CHOLHDL 2.5 12/20/2019 0537   VLDL 9 12/20/2019 0537   LDLCALC 48 09/05/2020 0943    History of Present Illness    72 year old male with the above past medical history of CAD s/p CABG x5 (LIMA-LAD, SVG-D1, sequential SVG-OM 2/OM 3 and SVG-PDA) with subsequent PCI/DES-SVG-PDA, SVG-Diagonal, aortic stenosis s/p AVR in 2021, hyperlipidemia, OSA, type 2 diabetes, and tobacco use.   He was hospitalized in October 2021 in the setting of NSTEMI. Echocardiogram at the time showed normal LV function, mild LVH, moderate aortic stenosis.  Cardiac catheterization showed RCA 95%, LCx 80%, and LAD 80% stenoses. CT surgery was consulted and he underwent CABG x 5 (LIMA-LAD, SVG-D1, sequential SVG-OM 2/OM 3 and SVG-PDA), in addition to aortic valve replacement with bioprosthetic bovine pericardial valve.  Pre-CABG carotid Doppler showed 1 to 39% B ICA stenosis.  Post hospital course was complicated by atrial fibrillation, treated with amiodarone and Eliquis.  Follow-up echocardiogram in November 2021 showed normal LV function, moderate LVH, G2 DD, previous aortic valve replacement with mean gradient 13 mmHg, no aortic insufficiency.  He underwent stress testing in 02/2022 which revealed a large reversible perfusion defect in the lateral and basal to mid inferior LV segments.  He was referred for outpatient cardiac catheterization in 04/05/2022 which showed patent LIMA-LAD but total occlusion of the sequential graft to OM1-OM2, 90% occlusion of SVG-PDA s/p DES.  Ostial SVG-diagonal showed 90% stenosis s/p DES.  He was discharged home the same day and was started on DAPT with aspirin and Plavix.  He was last seen in the office on 04/24/2022 and was stable from a cardiac standpoint.  He denied symptoms  concerning for angina.  He presents today for follow-up.  Since his last visit he has done well from a cardiac standpoint.  He completed cardiac rehab several weeks ago.  He has continued to exercise.  He denies any symptoms concerning for angina.  His BP has been borderline low, this is not new.  He notes occasional lightheadedness with position changes, he denies any presyncope, syncope.  Overall, he reports feeling well.   Home Medications    Current Outpatient Medications  Medication Sig Dispense Refill   ACCU-CHEK GUIDE test strip 1 each by Other route daily in the afternoon.     acetaminophen (TYLENOL) 500 MG tablet Take 1,000 mg by mouth every 6 (six) hours as needed (Joint pain).     aspirin EC 81 MG EC tablet Take 1 tablet (81 mg total) by mouth daily. Swallow whole. 30 tablet 11   atorvastatin (LIPITOR) 80 MG tablet Take 80 mg by mouth daily.      BD PEN NEEDLE NANO 2ND GEN 32G X 4 MM MISC USE AS DIRECTED ONCE A DAY     Blood Glucose Monitoring Suppl (ACCU-CHEK AVIVA PLUS) w/Device KIT daily in the afternoon.     canagliflozin (INVOKANA) 300 MG TABS tablet Take 300 mg by mouth daily.     cholestyramine light 4 g POWD Take by mouth daily. 1 Scoop     clopidogrel (PLAVIX) 75 MG tablet Take 1 tablet (75 mg total) by mouth daily. 30 tablet 11   doxazosin (CARDURA) 2 MG tablet Take 2 mg by mouth every morning. Takes for urinary freq     insulin degludec (TRESIBA) 100 UNIT/ML FlexTouch Pen Inject 20 Units into the skin at bedtime.     isosorbide mononitrate (IMDUR) 30 MG 24 hr tablet Take 1 tablet (30 mg total) by mouth daily. 90 tablet 3   Levothyroxine Sodium 112 MCG CAPS Take 112 mcg by mouth daily before breakfast.     lisinopril (ZESTRIL) 5 MG tablet Take 5 mg by mouth daily.     metFORMIN (GLUCOPHAGE) 1000 MG tablet Take 1,000 mg by mouth 2 (two) times daily with a meal.     pramipexole (MIRAPEX) 0.125 MG tablet Take 0.5 mg by mouth at bedtime.     nitroGLYCERIN (NITROSTAT) 0.4 MG  SL tablet Place 1 tablet (0.4 mg total) under the tongue every 5 (five) minutes as needed for chest pain. (Patient not taking: Reported on 08/13/2022) 25 tablet 3   No current facility-administered medications for this visit.     Review of Systems    He denies chest pain, palpitations, dyspnea, pnd, orthopnea, n, v, dizziness, syncope, edema, weight gain, or early satiety. All other systems reviewed and are otherwise negative except as noted above.   Physical Exam    VS:  BP (!) 96/52 (BP Location: Left Arm, Patient Position: Sitting, Cuff Size: Normal)   Pulse 78   Ht 5\' 9"  (1.753 m)   Wt 164 lb 9.6 oz (74.7 kg)   SpO2 95%   BMI 24.31 kg/m   GEN: Well nourished, well developed, in no acute distress. HEENT: normal. Neck: Supple, no JVD, carotid bruits, or masses. Cardiac: RRR, no murmurs, rubs, or gallops. No clubbing, cyanosis, edema.  Radials/DP/PT 2+ and equal bilaterally.  Respiratory:  Respirations regular and unlabored, clear to auscultation bilaterally. GI: Soft, nontender, nondistended, BS + x 4. MS: no deformity or atrophy. Skin: warm and dry, no rash. Neuro:  Strength and sensation are intact. Psych: Normal affect.  Accessory Clinical Findings    ECG personally reviewed by me today - No EKG in office today.    Lab Results  Component Value Date   WBC 5.0 04/07/2022   HGB 12.7 (L) 04/07/2022   HCT 38.2 04/07/2022   MCV 92 04/07/2022   PLT 184 04/07/2022   Lab Results  Component Value Date   CREATININE 0.80 04/07/2022   BUN 20 04/07/2022   NA 143 04/07/2022   K 5.0 04/07/2022   CL 105 04/07/2022   CO2 25 04/07/2022   Lab Results  Component Value Date   ALT 17 09/05/2020   AST 18 09/05/2020   ALKPHOS 66 09/05/2020   BILITOT 0.6 09/05/2020   Lab Results  Component Value Date   CHOL 115 09/05/2020   HDL 55 09/05/2020   LDLCALC 48 09/05/2020   TRIG 48 09/05/2020   CHOLHDL 2.1 09/05/2020    Lab Results  Component Value Date   HGBA1C 7.1 (H)  12/17/2019    Assessment & Plan   1. CAD/precordial pain: S/p CABG x 5 (LIMA-LAD, SVG-D1, sequential SVG-OM 2/OM 3 and SVG-PDA) in 2021.  Abnormal stress test in 02/2022.  F/u cath in 04/05/2022 which showed patent LIMA-LAD but total occlusion of the sequential graft to OM1-OM2, 90% occlusion of SVG-PDA s/p DES, and ostial SVG-diagonal with 90% stenosis s/p DES.  Stable with no anginal symptoms.  Prior to his cath he was pending carpal tunnel surgery.  He is aware that he is not to interrupt DAPT for at least 6 months, peripherally 12 months.  He notes he may receive a cortisone injection to his right wrist.  I advised him that this would be acceptable as long as he does not have to interrupt DAPT.  Continue aspirin, Plavix, lisinopril, Imdur, Lipitor.   2. Aortic valve stenosis: S/p AVR in 2021. Denies dyspnea, edema, PND, orthopnea. Continue SBE prophylaxis.    3. Hypotension: BP has been borderline low.  He notes occasional lightheadedness with position changes, denies any presyncope, syncope.  Continue to monitor BP.  If BP remains low, consider decreasing lisinopril.  4. Paroxysmal atrial fibrillation: Occurred in the postop setting, no recurrence.  Maintaining SR on exam. Not on anticoagulation.   5. Hyperlipidemia: LDL was 56 in 02/2022. Continue Lipitor.   6. OSA: Not on CPAP.  Denies any concerns.   7. Type 2 diabetes: A1c was 7.5 in 02/2022.  Monitored and managed per PCP.   8. Disposition: Follow-up in 6 months with Dr. Jens Som.       Joylene Grapes, NP 08/13/2022, 11:01 AM

## 2022-08-13 NOTE — Patient Instructions (Signed)
Medication Instructions:  Your physician recommends that you continue on your current medications as directed. Please refer to the Current Medication list given to you today.   *If you need a refill on your cardiac medications before your next appointment, please call your pharmacy*   Lab Work: NONE ordered at this time of appointment   If you have labs (blood work) drawn today and your tests are completely normal, you will receive your results only by: MyChart Message (if you have MyChart) OR A paper copy in the mail If you have any lab test that is abnormal or we need to change your treatment, we will call you to review the results.   Testing/Procedures: NONE ordered at this time of appointment     Follow-Up: At [facility], you and your health needs are our priority.  As part of our continuing mission to provide you with exceptional heart care, we have created designated Provider Care Teams.  These Care Teams include your primary Cardiologist (physician) and Advanced Practice Providers (APPs -  Physician Assistants and Nurse Practitioners) who all work together to provide you with the care you need, when you need it.  We recommend signing up for the patient portal called "MyChart".  Sign up information is provided on this After Visit Summary.  MyChart is used to connect with patients for Virtual Visits (Telemedicine).  Patients are able to view lab/test results, encounter notes, upcoming appointments, etc.  Non-urgent messages can be sent to your provider as well.   To learn more about what you can do with MyChart, go to [email or web address].    Your next appointment:   6 month(s)  Provider:   Brian Crenshaw, MD     Other Instructions   

## 2022-09-02 ENCOUNTER — Other Ambulatory Visit: Payer: Self-pay | Admitting: Acute Care

## 2022-09-02 DIAGNOSIS — Z122 Encounter for screening for malignant neoplasm of respiratory organs: Secondary | ICD-10-CM

## 2022-09-02 DIAGNOSIS — Z87891 Personal history of nicotine dependence: Secondary | ICD-10-CM

## 2022-09-11 DIAGNOSIS — G2581 Restless legs syndrome: Secondary | ICD-10-CM | POA: Diagnosis not present

## 2022-09-11 DIAGNOSIS — G4730 Sleep apnea, unspecified: Secondary | ICD-10-CM | POA: Diagnosis not present

## 2022-09-11 DIAGNOSIS — I2510 Atherosclerotic heart disease of native coronary artery without angina pectoris: Secondary | ICD-10-CM | POA: Diagnosis not present

## 2022-09-11 DIAGNOSIS — D6869 Other thrombophilia: Secondary | ICD-10-CM | POA: Diagnosis not present

## 2022-09-11 DIAGNOSIS — Z952 Presence of prosthetic heart valve: Secondary | ICD-10-CM | POA: Diagnosis not present

## 2022-09-11 DIAGNOSIS — E7800 Pure hypercholesterolemia, unspecified: Secondary | ICD-10-CM | POA: Diagnosis not present

## 2022-09-11 DIAGNOSIS — E1169 Type 2 diabetes mellitus with other specified complication: Secondary | ICD-10-CM | POA: Diagnosis not present

## 2022-09-11 DIAGNOSIS — I252 Old myocardial infarction: Secondary | ICD-10-CM | POA: Diagnosis not present

## 2022-09-11 DIAGNOSIS — E039 Hypothyroidism, unspecified: Secondary | ICD-10-CM | POA: Diagnosis not present

## 2022-09-11 DIAGNOSIS — I700 Atherosclerosis of aorta: Secondary | ICD-10-CM | POA: Diagnosis not present

## 2022-11-10 ENCOUNTER — Inpatient Hospital Stay: Admission: RE | Admit: 2022-11-10 | Payer: Medicare Other | Source: Ambulatory Visit

## 2022-11-11 ENCOUNTER — Encounter: Payer: Self-pay | Admitting: Cardiology

## 2022-11-12 DIAGNOSIS — E039 Hypothyroidism, unspecified: Secondary | ICD-10-CM | POA: Diagnosis not present

## 2022-12-16 ENCOUNTER — Telehealth: Payer: Self-pay | Admitting: Cardiology

## 2022-12-16 NOTE — Telephone Encounter (Signed)
Spoke with pt, he will see the pa Thursday this week.

## 2022-12-16 NOTE — Telephone Encounter (Signed)
   Pt c/o of Chest Pain: STAT if active CP, including tightness, pressure, jaw pain, radiating pain to shoulder/upper arm/back, CP unrelieved by Nitro. Symptoms reported of SOB, nausea, vomiting, sweating.  1. Are you having CP right now? No     2. Are you experiencing any other symptoms (ex. SOB, nausea, vomiting, sweating)? No    3. Is your CP continuous or coming and going? Coming and going,    4. Have you taken Nitroglycerin? No    5. How long have you been experiencing CP? Starting back walking a few weeks ago and now he is having CP only when he works out. He states it is mild.    6. If NO CP at time of call then end call with telling Pt to call back or call 911 if Chest pain returns prior to return call from triage team.

## 2022-12-16 NOTE — Telephone Encounter (Signed)
Patient states CP when exercising.  No symptoms other than the CP.  He states "achy and heaviness". Once he rest it will resolve.  He states this is the same symptoms as last year when blockage was found. He states he has no issues unless he exercises. No sweating , radiating pain or other symptoms.  He states he has nausea but this is a long time thing,.   He states this has been going on for a couple of months but is starting to be more uncomfortable.  ED precautions Please advise if appt or testing prior to appt.

## 2022-12-18 ENCOUNTER — Ambulatory Visit: Payer: Medicare Other | Attending: Physician Assistant | Admitting: Physician Assistant

## 2022-12-18 ENCOUNTER — Encounter: Payer: Self-pay | Admitting: Physician Assistant

## 2022-12-18 VITALS — BP 106/54 | HR 77 | Ht 69.0 in | Wt 162.0 lb

## 2022-12-18 DIAGNOSIS — I25708 Atherosclerosis of coronary artery bypass graft(s), unspecified, with other forms of angina pectoris: Secondary | ICD-10-CM

## 2022-12-18 DIAGNOSIS — E785 Hyperlipidemia, unspecified: Secondary | ICD-10-CM | POA: Diagnosis not present

## 2022-12-18 MED ORDER — RANOLAZINE ER 500 MG PO TB12: 500.0000 mg | ORAL_TABLET | Freq: Two times a day (BID) | ORAL | 2 refills | Status: DC

## 2022-12-18 NOTE — Patient Instructions (Signed)
Medication Instructions:  START RENEXA 500 MG TWICE A DAY *If you need a refill on your cardiac medications before your next appointment, please call your pharmacy*   Lab Work: NO LABS If you have labs (blood work) drawn today and your tests are completely normal, you will receive your results only by: MyChart Message (if you have MyChart) OR A paper copy in the mail If you have any lab test that is abnormal or we need to change your treatment, we will call you to review the results.   Testing/Procedures: How to Prepare for Your Cardiac PET/CT Stress Test:  1. Please do not take these medications before your test:   Medications that may interfere with the cardiac pharmacological stress agent (ex. nitrates - including erectile dysfunction medications, isosorbide mononitrate, tamulosin or beta-blockers) the day of the exam. (Erectile dysfunction medication should be held for at least 72 hrs prior to test) Theophylline containing medications for 12 hours. Dipyridamole 48 hours prior to the test. Your remaining medications may be taken with water.  2. Nothing to eat or drink, except water, 3 hours prior to arrival time.   NO caffeine/decaffeinated products, or chocolate 12 hours prior to arrival.  3. NO perfume, cologne or lotion on chest or abdomen area.          - FEMALES - Please avoid wearing dresses to this appointment.  4. Total time is 1 to 2 hours; you may want to bring reading material for the waiting time.  5. Please report to Radiology at the [facility] Entr[facility] 30 minutes early for your test.  [address] [place], [place] [address]  6. Please report to Radiology at [facility] Main Entrance, medical mall, 30 mins prior to your test.  [address]  [place], [place]  [phone]  Diabetic Preparation:  Hold oral medications. You may take NPH and Lantus insulin. Do not take Humalog or Humulin R (Regular Insulin) the  day of your test. Check blood sugars prior to leaving the house. If able to eat breakfast prior to 3 hour fasting, you may take all medications, including your insulin, Do not worry if you miss your breakfast dose of insulin - start at your next meal. Patients who wear a continuous glucose monitor MUST remove the device prior to scanning.  IF YOU THINK YOU MAY BE PREGNANT, OR ARE NURSING PLEASE INFORM THE TECHNOLOGIST.  In preparation for your appointment, medication and supplies will be purchased.  Appointment availability is limited, so if you need to cancel or reschedule, please call the Radiology Department at [phone] [facility]) OR [phone] [facility])  24 hours in advance to avoid a cancellation fee of $100.00  What to Expect After you Arrive:  Once you arrive and check in for your appointment, you will be taken to a preparation room within the Radiology Department.  A technologist or Nurse will obtain your medical history, verify that you are correctly prepped for the exam, and explain the procedure.  Afterwards,  an IV will be started in your arm and electrodes will be placed on your skin for EKG monitoring during the stress portion of the exam. Then you will be escorted to the PET/CT scanner.  There, staff will get you positioned on the scanner and obtain a blood pressure and EKG.  During the exam, you will continue to be connected to the EKG and blood pressure machines.  A small, safe amount of a radioactive tracer will be  injected in your IV to obtain a series of pictures of your heart along with an injection of a stress agent.    After your Exam:  It is recommended that you eat a meal and drink a caffeinated beverage to counter act any effects of the stress agent.  Drink plenty of fluids for the remainder of the day and urinate frequently for the first couple of hours after the exam.  Your doctor will inform you of your test results within 7-10 business days.  For more information  and frequently asked questions, please visit our website : [email or web address]  For questions about your test or how to prepare for your test, please call: Cardiac Imaging Nurse Navigators Office: [phone]    Follow-Up: At [facility], you and your health needs are our priority.  As part of our continuing mission to provide you with exceptional heart care, we have created designated Provider Care Teams.  These Care Teams include your primary Cardiologist (physician) and Advanced Practice Providers (APPs -  Physician Assistants and Nurse Practitioners) who all work together to provide you with the care you need, when you need it.  Your next appointment:   4 week(s)  Provider:   Azalee Course, PA

## 2022-12-18 NOTE — Progress Notes (Signed)
Cardiology Office Note:  .   Date:  12/18/2022  ID:  Steven Lawrence, DOB 15-Mar-1951, MRN [id number] PCP: Milus Height, PA  [facility] Providers Cardiologist:  Olga Millers, MD Cardiology APP:  Marcelino Duster, PA    History of Present Illness: .   Steven Lawrence is a 72 y.o. male with PMH of CAD s/p CABG x 5 (LIMA-LAD, SVG-D1, sequential SVG-OM2-OM3, and SVG-PDA) and aortic valve replacement in 2021, hyperlipidemia, DM2, OSA and tobacco use.  Patient was hospitalized in October 2021 in the setting of NSTEMI.  Echocardiogram at the time showed normal EF, mild LVH, moderate aortic stenosis.  Cardiac catheterization showed 95% RCA lesion, 80% left circumflex lesion, and 80% LAD lesion.  He was seen by CT surgery and ultimately underwent CABG x 5 in addition to aortic valve replacement with bioprosthetic pericardial valve.  Pre-CABG Doppler showed 1 to 39% bilateral ICA disease.  Postop course complicated by atrial fibrillation treated with amiodarone and Eliquis.  Follow-up echocardiogram in November 2021 showed normal EF, grade 2 DD and stable aortic valve replacement.  He underwent Myoview in December 2023 which revealed a large reversible perfusion defect in the lateral and the basal to mid inferior LV segment.  He was referred for outpatient cardiac catheterization on 04/05/2022 which showed patent LIMA to LAD but total occlusion of sequential graft to OM1-OM2, and 90% occlusion of SVG-PDA treated with DES.  90% stenosis in the SVG to diagonal treated with DES.  He was discharged home on the same day on aspirin and Plavix.  He was last seen by Bernadene Person NP on 08/13/2022 at which time he was doing well.  Patient presents today along with wife.  Since May of this year, his substernal chest ache has came back.  It typically occur after walking a certain distance.  He never has chest pain at rest.  He has been compliant with aspirin and Plavix therapy.  Last lipid panel obtained in 2022 was very  well-controlled.  At this time, he is having chest pain on a daily basis as he walk roughly 3 miles every morning.  I discussed the case with DOD Dr. Rennis Golden, our suspicion is high that patient likely has a another coronary artery blockage.  I discussed with the patient various options including a stress test versus cardiac catheterization, he wished to hold off on cardiac catheterization and undergo stress test for now.  I think this is reasonable to assess the size of ischemia.  I will also add on a Ranexa 500 mg twice a day for antianginal purposes.  I plan to bring the patient back in 4 weeks for reassessment.  ROS:   Patient complains of chest pain but denies any shortness of breath with exertion, palpitation, lower extremity edema, orthopnea or PND.  Studies Reviewed: .        Cardiac Studies & Procedures   CARDIAC CATHETERIZATION  CARDIAC CATHETERIZATION 04/11/2022  Narrative   1st Diag lesion is 100% stenosed.   Origin to Prox Graft lesion before 1st Mrg  is 100% stenosed.   Seq SVG- OM1 and OM2.  Severe multivessel CAD with severe LAD stenosis, total occlusion of the LCx (new from prior cath when there was severe calcific stenosis), and total occlusion of the RCA (chronic) S/P CABG with continued patency of the LIMA-LAD without stenosis Total occlusion of the sequential graft to OM1 and OM2 Severe stenosis at the ostium of the SVG-diagonal, treated successfully with a 3.5x16 mm Synergy  DES Severe stenosis at the ostium of the SVG-PDA, treated successfully with a 3.5x16 mm Synergy DES Likely normal function of the aortic bioprosthesis, crossed with J-wire, pseudo-gradient from catheter whip artifact  Recommend: Same day PCI protocol if criteria met. ASA/clopidogrel x 12 months favor long-term if tolerated  Findings Coronary Findings Diagnostic  Dominance: Right  Left Anterior Descending Mid LAD lesion is 80% stenosed.  First Diagonal Branch 1st Diag lesion is 100%  stenosed.  Left Circumflex Prox Cx lesion is 100% stenosed.  Third Obtuse Marginal Branch Collaterals 3rd Mrg filled by collaterals from 1st Sept.  Right Coronary Artery Ost RCA to Prox RCA lesion is 100% stenosed.  Right Posterior Atrioventricular Artery Collaterals RPAV filled by collaterals from Prox Cx.  First Right Posterolateral Branch Collaterals 1st RPL filled by collaterals from Dist LAD.  LIMA Graft To Dist LAD Graft was visualized by angiography and is normal in caliber.  The graft exhibits no disease.  Graft To 1st Diag Origin to Prox Graft lesion is 90% stenosed. The lesion is calcified.  Graft To RPDA Origin lesion is 90% stenosed. Focal ostial lesion with severe pressure dampening  Sequential Jump Graft Graft To 1st Mrg, 2nd Mrg Seq SVG- OM1 and OM2.  Chronic occlusion at origin of graft Origin to Prox Graft lesion before 1st Mrg  is 100% stenosed.  Intervention  Origin to Prox Graft lesion (Graft To 1st Diag) Stent CATHETER LAUNCHER 449F MPST guide catheter was inserted. Lesion crossed with guidewire using a WIRE COUGAR XT STRL 190CM. Pre-stent angioplasty was performed using a BALLN SAPPHIRE 3.0X12. Maximum pressure:  14 atm. A drug-eluting stent was successfully placed using a SYNERGY XD 3.50X16. Stent does not overlap previously placed stentPost-stent angioplasty was not performed. The lesion was resistant to balloon angioplasty but yielded at 14 atm. Post-Intervention Lesion Assessment The intervention was successful. Pre-interventional TIMI flow is 3. Post-intervention TIMI flow is 3. No complications occurred at this lesion. There is a 0% residual stenosis post intervention.  Origin lesion (Graft To RPDA) Stent CATHETER LAUNCHER 449F MPST guide catheter was inserted. Lesion crossed with guidewire using a WIRE COUGAR XT STRL 190CM. Pre-stent angioplasty was performed using a BALLN SAPPHIRE 3.0X12. A drug-eluting stent was successfully placed using a  SYNERGY XD 3.50X16. Post-stent angioplasty was not performed. Stent covers ostium, TIMI-3 flow pre and post-PCI. Graft pre-treated with IC verapamil. Post-Intervention Lesion Assessment The intervention was successful. Pre-interventional TIMI flow is 3. Post-intervention TIMI flow is 3. No complications occurred at this lesion. There is a 0% residual stenosis post intervention.   CARDIAC CATHETERIZATION  CARDIAC CATHETERIZATION 12/19/2019  Narrative Images from the original result were not included.   Ost RCA to Prox RCA lesion is 100% stenosed.  Prox Cx lesion is 95% stenosed.  Mid Cx lesion is 95% stenosed.  Mid LAD lesion is 80% stenosed.  JALEIL RENWICK is a 72 y.o. male   [id number] LOCATION:  FACILITY: [facility] PHYSICIAN: Nanetta Batty, M.D. January 11, 1951   DATE OF PROCEDURE:  12/19/2019  DATE OF DISCHARGE:     CARDIAC CATHETERIZATION    History obtained from chart review.72 year old male with past medical history of diabetes mellitus, hyperlipidemia, restless leg syndrome, benign prostatic hypertrophy, tobacco abuse admitted with non-ST elevation myocardial infarction.  Echocardiogram shows normal LV function, mild left ventricular hypertrophy, moderate aortic stenosis with mean gradient 22 mmHg.  Impression Mr. Judon has diffusely calcified vessels, three-vessel disease and preserved LV function.  He did not have a pullback gradient across the aortic valve.  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  I believe the best therapy would be complete revascularization with CABG.  The sheath was removed and a TR band was placed on the right wrist to achieve patent hemostasis.  The patient left lab in stable condition.  Heparin will be restarted 4 hours after sheath removal.  T CTS will be notified.  Nanetta Batty. MD, [facility] 12/19/2019 8:14  AM  Findings Coronary Findings Diagnostic  Dominance: Right  Left Anterior Descending Mid LAD lesion is 80% stenosed.  Left Circumflex Prox Cx lesion is 95% stenosed. Mid Cx lesion is 95% stenosed.  Right Coronary Artery Ost RCA to Prox RCA lesion is 100% stenosed.  Right Posterior Atrioventricular Artery Collaterals RPAV filled by collaterals from Prox Cx.  First Right Posterolateral Branch Collaterals 1st RPL filled by collaterals from Dist LAD.  Intervention  No interventions have been documented.   STRESS TESTS  NM PET CT CARDIAC PERFUSION MULTI W/ABSOLUTE BLOODFLOW 04/01/2022  Narrative   The study is high risk due to the presence of a large, reversible perfusion defect in the lateral and basal-to-mid inferior LV segments consistent with ischemia, the presence of TID, and a drop in EF with stress. Recommend coronary catheterization if clinically indicated.   LV perfusion is abnormal. There is evidence of ischemia. Defect 1: There is a medium defect with severe reduction in uptake present in the apical to basal anterolateral and inferolateral location(s) that is reversible. There is abnormal wall motion in the defect area. Consistent with ischemia. Defect 2: There is a small defect present in the mid to basal inferior location(s). There is abnormal wall motion in the defect area. Consistent with ischemia.   Rest left ventricular function is normal. Rest EF: 58 %. EF decreased with stress to 50% consistent with high risk findings. End diastolic cavity size is normal. End systolic cavity size is normal.   Myocardial blood flow reserve is not reported in this patient due to history of CABG that affect accuracy. Of note, MBF is most abnormal in the LCx territory which corresponds with perfusion abnormality seen on imaging.   Coronary calcium assessment not performed due to prior revascularization.  CLINICAL DATA:  This over-read does not include interpretation of cardiac or  coronary anatomy or pathology. The Cardiac PET CT interpretation by the cardiologist is attached.  COMPARISON:  Lung cancer screening CT dated November 06, 2021  FINDINGS: Vascular: Normal heart size. Severe coronary artery calcifications status post CABG. Normal caliber thoracic aorta with moderate calcified plaque.  Mediastinum/Nodes: Esophagus is unremarkable. No pathologically enlarged lymph nodes seen in the chest.  Lungs/Pleura: Central airways are patent. No consolidation, pleural effusion or pneumothorax. No new or enlarging pulmonary nodules in the visualized portions of the lungs.  Upper Abdomen: No acute abnormality.  Musculoskeletal: Prior median sternotomy. No acute osseous abnormality.  IMPRESSION: 1. No acute findings in the chest. 2. Severe coronary artery calcifications status post CABG. 3. Aortic Atherosclerosis (ICD10-I70.0).   Electronically Signed By: Allegra Lai M.D. On: 04/01/2022 13:50   ECHOCARDIOGRAM  ECHOCARDIOGRAM COMPLETE 02/08/2020  Narrative ECHOCARDIOGRAM REPORT    Patient Name:   Steven Lawrence  Date of Exam: 02/08/2020 Medical Rec #:  [id number]     Height:       69.0 in Accession #:    [id number]    Weight:       148.0  lb Date of Birth:  November 21, 1950     BSA:          1.818 m Patient Age:    39 years      BP:           132/73 mmHg Patient Gender: M             HR:           82 bpm. Exam Location:  [address]  Procedure: 2D Echo, Cardiac Doppler and Color Doppler  Indications:    I35.9* Aortic valve disease, unspecified  History:        Patient has prior history of Echocardiogram examinations, most recent 12/21/2019. Previous Myocardial Infarction, Prior CABG, Signs/Symptoms:Chest Pain; Risk Factors:Hypertension and Diabetes. Aortic stenosis. Aortic valve replacement. Obstructive sleep apnea.  Sonographer:    Cathie Beams RCS Referring Phys: [phone] TRACI R [name]  IMPRESSIONS   1. Left ventricular ejection fraction, by  estimation, is 60 to 65%. The left ventricle has normal function. The left ventricle has no regional wall motion abnormalities. There is moderate left ventricular hypertrophy. Left ventricular diastolic parameters are consistent with Grade II diastolic dysfunction (pseudonormalization). Elevated left atrial pressure. 2. Right ventricular systolic function is normal. The right ventricular size is normal. Tricuspid regurgitation signal is inadequate for assessing PA pressure. 3. The mitral valve is normal in structure. Trivial mitral valve regurgitation. 4. There is a 23 mm Inspiris Resilia valve present in the aortic position. Aortic valve regurgitation is not visualized. MG 13 mmHg. EOA 1.6 cm^2. Echo findings are consistent with normal structure and function of the aortic valve prosthesis. 5. The inferior vena cava is normal in size with greater than 50% respiratory variability, suggesting right atrial pressure of 3 mmHg.  FINDINGS Left Ventricle: Left ventricular ejection fraction, by estimation, is 60 to 65%. The left ventricle has normal function. The left ventricle has no regional wall motion abnormalities. The left ventricular internal cavity size was small. There is moderate left ventricular hypertrophy. Left ventricular diastolic parameters are consistent with Grade II diastolic dysfunction (pseudonormalization). Elevated left atrial pressure.  Right Ventricle: The right ventricular size is normal. Right vetricular wall thickness was not assessed. Right ventricular systolic function is normal. Tricuspid regurgitation signal is inadequate for assessing PA pressure.  Left Atrium: Left atrial size was normal in size.  Right Atrium: Right atrial size was normal in size.  Pericardium: There is no evidence of pericardial effusion.  Mitral Valve: The mitral valve is normal in structure. Trivial mitral valve regurgitation.  Tricuspid Valve: The tricuspid valve is normal in structure. Tricuspid  valve regurgitation is trivial.  Aortic Valve: The aortic valve has been repaired/replaced. Aortic valve regurgitation is not visualized. Aortic valve mean gradient measures 11.5 mmHg. Aortic valve peak gradient measures 23.2 mmHg. Aortic valve area, by VTI measures 1.66 cm. There is a 23 mm Inspiris Resilia valve present in the aortic position. Echo findings are consistent with normal structure and function of the aortic valve prosthesis.  Pulmonic Valve: The pulmonic valve was not well visualized. Pulmonic valve regurgitation is not visualized.  Aorta: The aortic root is normal in size and structure.  Venous: The inferior vena cava is normal in size with greater than 50% respiratory variability, suggesting right atrial pressure of 3 mmHg.  IAS/Shunts: No atrial level shunt detected by color flow Doppler.   LEFT VENTRICLE PLAX 2D LVIDd:         3.30 cm  Diastology LVIDs:  2.30 cm  LV e' medial:    6.09 cm/s LV PW:         1.50 cm  LV E/e' medial:  19.5 LV IVS:        1.40 cm  LV e' lateral:   7.29 cm/s LVOT diam:     2.00 cm  LV E/e' lateral: 16.3 LV SV:         71 LV SV Index:   39 LVOT Area:     3.14 cm   RIGHT VENTRICLE RV Basal diam:  2.50 cm RV S prime:     12.80 cm/s TAPSE (M-mode): 1.5 cm  LEFT ATRIUM             Index       RIGHT ATRIUM           Index LA diam:        3.30 cm 1.82 cm/m  RA Area:     11.10 cm LA Vol (A2C):   32.9 ml 18.10 ml/m RA Volume:   22.20 ml  12.21 ml/m LA Vol (A4C):   43.7 ml 24.04 ml/m LA Biplane Vol: 37.7 ml 20.74 ml/m AORTIC VALVE AV Area (Vmax):    1.53 cm AV Area (Vmean):   1.47 cm AV Area (VTI):     1.66 cm AV Vmax:           241.00 cm/s AV Vmean:          156.000 cm/s AV VTI:            0.426 m AV Peak Grad:      23.2 mmHg AV Mean Grad:      11.5 mmHg LVOT Vmax:         117.00 cm/s LVOT Vmean:        73.100 cm/s LVOT VTI:          0.225 m LVOT/AV VTI ratio: 0.53  MITRAL VALVE MV Area (PHT): 3.50 cm      SHUNTS MV Decel Time: 217 msec     Systemic VTI:  0.22 m MV E velocity: 119.00 cm/s  Systemic Diam: 2.00 cm MV A velocity: 105.00 cm/s MV E/A ratio:  1.13  Epifanio Lesches MD Electronically signed by Epifanio Lesches MD Signature Date/Time: 02/08/2020/7:13:53 PM    Final   TEE  ECHO INTRAOPERATIVE TEE 12/21/2019  Narrative *INTRAOPERATIVE TRANSESOPHAGEAL REPORT *    Patient Name:   Steven Lawrence   Date of Exam: 12/21/2019 Medical Rec #:  [id number]      Height:       69.0 in Accession #:    [id number]     Weight:       144.7 lb Date of Birth:  06-07-1950      BSA:          1.80 m Patient Age:    69 years       BP:           125/75 mmHg Patient Gender: M              HR:           58 bpm. Exam Location:  Anesthesiology  Transesophogeal exam was perform intraoperatively during surgical procedure. Patient was closely monitored under general anesthesia during the entirety of examination.  Indications:     Aortic Stenosis; CAD Native Vessels Sonographer:     Thurman Coyer RDCS (AE) Performing Phys: [id number] [address]atore Decent HENDRICKSON Diagnosing Phys: Gaynelle Adu MD  Complications: No known complications  during this procedure. POST-OP IMPRESSIONS - Left Ventricle: The left ventricle is unchanged from pre-bypass. - Aortic Valve: A bioprosthetic valve was placed, leaflets are freely mobile and leaflets thin. There is no regurgitation. The gradient recorded across the prosthetic valve is within the expected range. - Mitral Valve: The mitral valve appears unchanged from pre-bypass. - Tricuspid Valve: The tricuspid valve appears unchanged from pre-bypass.  PRE-OP FINDINGS Left Ventricle: The left ventricle has normal systolic function, with an ejection fraction of 60-65%. The cavity size was normal. There is mildly increased left ventricular wall thickness.  Right Ventricle: The right ventricle has normal systolic function. The cavity was mildly enlarged. There is no  increase in right ventricular wall thickness.  Left Atrium: Left atrial size was not assessed. The left atrial appendage is well visualized and there is no evidence of thrombus present.  Right Atrium: Right atrial size was not assessed.  Interatrial Septum: No atrial level shunt detected by color flow Doppler.  Pericardium: There is no evidence of pericardial effusion.  Mitral Valve: The mitral valve is normal in structure. Mitral valve regurgitation is not visualized by color flow Doppler.  Tricuspid Valve: The tricuspid valve was normal in structure. Tricuspid valve regurgitation was not visualized by color flow Doppler.  Aortic Valve: The aortic valve is tricuspid There is moderate thickening of the aortic valve and There is moderate calcification of the aortic valve Aortic valve regurgitation was not visualized by color flow Doppler. There is moderate stenosis of the aortic valve, with a calculated valve area of 1.07 cm.  Pulmonic Valve: The pulmonic valve was normal in structure. Pulmonic valve regurgitation is not visualized by color flow Doppler.   +--------------+--------++ LEFT VENTRICLE         +--------------+--------++ PLAX 2D                +--------------+--------++ LVIDd:        4.30 cm  +--------------+--------++ LVIDs:        2.50 cm  +--------------+--------++ LVOT diam:    2.00 cm  +--------------+--------++ LV SV:        61 ml    +--------------+--------++ LV SV Index:  34.03    +--------------+--------++ LVOT Area:    3.14 cm +--------------+--------++                        +--------------+--------++  +------------------+------------++ AORTIC VALVE                   +------------------+------------++ AV Area (Vmax):   0.94 cm     +------------------+------------++ AV Area (Vmean):  0.90 cm     +------------------+------------++ AV Area (VTI):    1.07 cm      +------------------+------------++ AV Vmax:          269.00 cm/s  +------------------+------------++ AV Vmean:         168.500 cm/s +------------------+------------++ AV VTI:           0.554 m      +------------------+------------++ AV Peak Grad:     28.9 mmHg    +------------------+------------++ AV Mean Grad:     14.0 mmHg    +------------------+------------++ LVOT Vmax:        80.30 cm/s   +------------------+------------++ LVOT Vmean:       48.500 cm/s  +------------------+------------++ LVOT VTI:         0.189 m      +------------------+------------++ LVOT/AV VTI ratio:0.34         +------------------+------------++   +--------------+-------+  SHUNTS                +--------------+-------+ Systemic VTI: 0.19 m  +--------------+-------+ Systemic Diam:2.00 cm +--------------+-------+   Gaynelle Adu MD Electronically signed by Gaynelle Adu MD Signature Date/Time: 12/21/2019/3:57:24 PM    Final            Risk Assessment/Calculations:            Physical Exam:   VS:  There were no vitals taken for this visit.   Wt Readings from Last 3 Encounters:  08/13/22 164 lb 9.6 oz (74.7 kg)  07/25/22 164 lb 10.9 oz (74.7 kg)  07/21/22 164 lb 7.4 oz (74.6 kg)    GEN: Well nourished, well developed in no acute distress NECK: No JVD; No carotid bruits CARDIAC: RRR, no murmurs, rubs, gallops RESPIRATORY:  Clear to auscultation without rales, wheezing or rhonchi  ABDOMEN: Soft, non-tender, non-distended EXTREMITIES:  No edema; No deformity   ASSESSMENT AND PLAN: .    Stable Angina Recurrent chest pain with exertion, similar to previous episodes. Pain is central, leaning to the right, described as an ache or heaviness. Occurs with walking or strenuous activity at the gym, and resolves with rest. Symptoms have been increasing in frequency. No associated shortness of breath. Patient is currently on Imdur 30mg  daily,  Lisinopril 5mg  daily, and Atorvastatin. Blood pressure is borderline, limiting options for additional anti-anginal medications. -Start Ranexa 500mg  twice daily. -Order PET stress test to assess area of ischemia. -Light activity only, avoid strenuous exercise. -If chest pain occurs, rest and take nitroglycerin if pain persists after 5 minutes. If still in pain after 3 doses of nitroglycerin, call 911. -Follow-up in 3-4 weeks to review stress test results and discuss next steps.  Hyperlipidemia Patient is currently on Atorvastatin. Last cholesterol profile showed LDL of 48, which is well controlled. -Continue Atorvastatin.     Informed Consent   Shared Decision Making/Informed Consent The risks [chest pain, shortness of breath, cardiac arrhythmias, dizziness, blood pressure fluctuations, myocardial infarction, stroke/transient ischemic attack, nausea, vomiting, allergic reaction, radiation exposure, metallic taste sensation and life-threatening complications (estimated to be 1 in 10,000)], benefits (risk stratification, diagnosing coronary artery disease, treatment guidance) and alternatives of a cardiac PET stress test were discussed in detail with Mr. Toppins and he agrees to proceed.     Dispo: Follow-up in 4 weeks  Signed, Azalee Course, PA

## 2022-12-22 ENCOUNTER — Telehealth (HOSPITAL_COMMUNITY): Payer: Self-pay | Admitting: Emergency Medicine

## 2022-12-22 DIAGNOSIS — Z23 Encounter for immunization: Secondary | ICD-10-CM | POA: Diagnosis not present

## 2022-12-22 NOTE — Telephone Encounter (Signed)
Attempted to call patient regarding upcoming cardiac PET appointment. Left message on voicemail with name and callback number Aidan Moten RN Navigator Cardiac Imaging [facility] [phone] Office [phone] Cell  

## 2022-12-22 NOTE — Telephone Encounter (Signed)
Reaching out to patient to offer assistance regarding upcoming cardiac imaging study; pt verbalizes understanding of appt date/time, parking situation and where to check in, pre-test NPO status and medications ordered, and verified current allergies; name and call back number provided for further questions should they arise Rockwell Alexandria RN Navigator Cardiac Imaging [facility] [phone] office [phone] cell  Hold imdur

## 2022-12-24 ENCOUNTER — Ambulatory Visit (HOSPITAL_COMMUNITY)
Admission: RE | Admit: 2022-12-24 | Discharge: 2022-12-24 | Disposition: A | Discharge status: Home / Self Care | Payer: Medicare Other | Source: Ambulatory Visit | Attending: Physician Assistant | Admitting: Physician Assistant

## 2022-12-24 DIAGNOSIS — Z952 Presence of prosthetic heart valve: Secondary | ICD-10-CM | POA: Diagnosis not present

## 2022-12-24 DIAGNOSIS — I700 Atherosclerosis of aorta: Secondary | ICD-10-CM | POA: Diagnosis not present

## 2022-12-24 DIAGNOSIS — I25708 Atherosclerosis of coronary artery bypass graft(s), unspecified, with other forms of angina pectoris: Secondary | ICD-10-CM | POA: Diagnosis not present

## 2022-12-24 DIAGNOSIS — Z9889 Other specified postprocedural states: Secondary | ICD-10-CM | POA: Insufficient documentation

## 2022-12-24 DIAGNOSIS — I2510 Atherosclerotic heart disease of native coronary artery without angina pectoris: Secondary | ICD-10-CM | POA: Diagnosis not present

## 2022-12-24 LAB — NM PET CT CARDIAC PERFUSION MULTI W/ABSOLUTE BLOODFLOW
Nuc Rest EF: 44 %
Nuc Stress EF: 47 %
Rest Nuclear Isotope Dose: 19.4 mCi
ST Depression (mm): 0 mm
Stress Nuclear Isotope Dose: 19.3 mCi
TID: 1.27

## 2022-12-24 MED ORDER — RUBIDIUM RB82 GENERATOR (RUBYFILL)
20.0000 | PACK | Freq: Once | INTRAVENOUS | Status: AC
  Administered 2022-12-24: 19.26 via INTRAVENOUS

## 2022-12-24 MED ORDER — RUBIDIUM RB82 GENERATOR (RUBYFILL)
20.0000 | PACK | Freq: Once | INTRAVENOUS | Status: AC
  Administered 2022-12-24: 19.37 via INTRAVENOUS

## 2022-12-24 MED ORDER — REGADENOSON 0.4 MG/5ML IV SOLN
0.4000 mg | Freq: Once | INTRAVENOUS | Status: AC
  Administered 2022-12-24: 0.4 mg via INTRAVENOUS

## 2022-12-24 MED ORDER — REGADENOSON 0.4 MG/5ML IV SOLN
INTRAVENOUS | Status: AC
  Filled 2022-12-24: qty 5

## 2022-12-27 NOTE — H&P (View-Only) (Signed)
Cardiology Office Note:  .   Date:  12/29/2022  ID:  [name], DOB 07/10/1950, MRN [id number] PCP: Milus Height, PA  [facility] Providers Cardiologist:  Olga Millers, MD Cardiology APP:  Marcelino Duster, PA    History of Present Illness: .   QUINLIN CONANT is a 72 y.o. male with past medical history of CAD s/p CABG and aortic valve replacement 2021, hyperlipidemia, type 2 diabetes, OSA, tobacco use.  Patient is followed by Dr. Jens Som and presents today for follow-up appointment after an abnormal cardiac PET scan.  Per chart review, patient was hospitalized in 12/2019 for NSTEMI.  Echocardiogram 12/17/2019 showed EF 60-65%, no regional wall motion abnormalities, normal RV function, moderate aortic valve stenosis.  Left heart catheterization on 12/19/2019 showed diffusely calcified vessels, three-vessel disease, preserved LV function.  His RCA was totally occluded and he had high-grade calcified tandem AV groove lesions in the mid LAD lesion.  Recommended evaluation for CABG.  He was seen by CT surgery and ultimately underwent CABG x 5 (LIMA-LAD, SVG-D1, sequential SVG-OM2-OM3, and SVG-PDA) and aortic valve replacement with a bioprosthetic pericardial valve.  Pre-CABG Dopplers showed 1-39% bilateral ICA disease.  After surgery, patient did have atrial fibrillation that was treated with amiodarone and Eliquis.  Follow-up echocardiogram in 01/2020 showed normal EF, grade 2 DD, stable aortic valve replacement.  Patient underwent stress test on 02/2022 which revealed a large reversible perfusion defect in the lateral and basal-mid inferior LV segment.  He underwent cardiac catheterization on 04/05/2022 which showed patent LIMA-LAD, total occlusion of SVG to OM1 and OM 2, severe stenosis of the ostium of the SVG-diagonal which was treated with DES, and severe stenosis at the ostium of the SVG-PDA which was treated with DES.  Patient was seen by Azalee Course PA-C in clinic on 12/18/2022.  At  that time, patient reported a stop sternal chest ache that occurred with exertion.  Denied chest pain at rest.  He had been compliant with his aspirin, Plavix.  He had been having chest pain daily as he walked 3 miles every morning.  Patient was offered stress test versus cardiac catheterization, and he preferred to undergo stress testing.  He underwent cardiac PET scan on 12/24/2022 that showed severe reversible apical to basal inferior/lateral perfusion defect, consistent with large area of ischemia.  Study was high risk and recommended cardiac catheterization.  Today, patient reports that he came to discuss his recent nuclear stress test.  He understands that his stress test was abnormal and showed an area of ischemia, understands that cardiac catheterization was recommended.  Reports that he is continue to have some chest pain on exertion.  He likes to walk in the mornings, usually for 45 minutes/day.  He has had to slow his pace due to chest pain.  Chest pain relieved with rest.  Last week, he had 1 episode of chest pain that occurred while he was at rest.  Chest pain improved with nitroglycerin.  He has not had recurrence of chest pain at rest.  He denies shortness of breath, dizziness, syncope, near syncope, ankle edema.  He is somewhat frustrated because he is following a healthy diet, his cholesterol is well-controlled, and he exercises regularly.  However, he continues to have issues with coronary artery disease and chest pain.  He has a family history of heart problems.  Also has family history of diabetes, HTN.  He is in agreement with cardiac catheterization.  Would prefer if Dr. Excell Seltzer can do  his cath as he did the last one.    Studies Reviewed: .    Cardiac Studies & Procedures   CARDIAC CATHETERIZATION  CARDIAC CATHETERIZATION 04/11/2022  Narrative   1st Diag lesion is 100% stenosed.   Origin to Prox Graft lesion before 1st Mrg  is 100% stenosed.   Seq SVG- OM1 and OM2.  Severe  multivessel CAD with severe LAD stenosis, total occlusion of the LCx (new from prior cath when there was severe calcific stenosis), and total occlusion of the RCA (chronic) S/P CABG with continued patency of the LIMA-LAD without stenosis Total occlusion of the sequential graft to OM1 and OM2 Severe stenosis at the ostium of the SVG-diagonal, treated successfully with a 3.5x16 mm Synergy DES Severe stenosis at the ostium of the SVG-PDA, treated successfully with a 3.5x16 mm Synergy DES Likely normal function of the aortic bioprosthesis, crossed with J-wire, pseudo-gradient from catheter whip artifact  Recommend: Same day PCI protocol if criteria met. ASA/clopidogrel x 12 months favor long-term if tolerated  Findings Coronary Findings Diagnostic  Dominance: Right  Left Anterior Descending Mid LAD lesion is 80% stenosed.  First Diagonal Branch 1st Diag lesion is 100% stenosed.  Left Circumflex Prox Cx lesion is 100% stenosed.  Third Obtuse Marginal Branch Collaterals 3rd Mrg filled by collaterals from 1st Sept.  Right Coronary Artery Ost RCA to Prox RCA lesion is 100% stenosed.  Right Posterior Atrioventricular Artery Collaterals RPAV filled by collaterals from Prox Cx.  First Right Posterolateral Branch Collaterals 1st RPL filled by collaterals from Dist LAD.  LIMA Graft To Dist LAD Graft was visualized by angiography and is normal in caliber.  The graft exhibits no disease.  Graft To 1st Diag Origin to Prox Graft lesion is 90% stenosed. The lesion is calcified.  Graft To RPDA Origin lesion is 90% stenosed. Focal ostial lesion with severe pressure dampening  Sequential Jump Graft Graft To 1st Mrg, 2nd Mrg Seq SVG- OM1 and OM2.  Chronic occlusion at origin of graft Origin to Prox Graft lesion before 1st Mrg  is 100% stenosed.  Intervention  Origin to Prox Graft lesion (Graft To 1st Diag) Stent CATHETER LAUNCHER 74F MPST guide catheter was inserted. Lesion crossed  with guidewire using a WIRE COUGAR XT STRL 190CM. Pre-stent angioplasty was performed using a BALLN SAPPHIRE 3.0X12. Maximum pressure:  14 atm. A drug-eluting stent was successfully placed using a SYNERGY XD 3.50X16. Stent does not overlap previously placed stentPost-stent angioplasty was not performed. The lesion was resistant to balloon angioplasty but yielded at 14 atm. Post-Intervention Lesion Assessment The intervention was successful. Pre-interventional TIMI flow is 3. Post-intervention TIMI flow is 3. No complications occurred at this lesion. There is a 0% residual stenosis post intervention.  Origin lesion (Graft To RPDA) Stent CATHETER LAUNCHER 74F MPST guide catheter was inserted. Lesion crossed with guidewire using a WIRE COUGAR XT STRL 190CM. Pre-stent angioplasty was performed using a BALLN SAPPHIRE 3.0X12. A drug-eluting stent was successfully placed using a SYNERGY XD 3.50X16. Post-stent angioplasty was not performed. Stent covers ostium, TIMI-3 flow pre and post-PCI. Graft pre-treated with IC verapamil. Post-Intervention Lesion Assessment The intervention was successful. Pre-interventional TIMI flow is 3. Post-intervention TIMI flow is 3. No complications occurred at this lesion. There is a 0% residual stenosis post intervention.   CARDIAC CATHETERIZATION  CARDIAC CATHETERIZATION 12/19/2019  Narrative Images from the original result were not included.   Ost RCA to Prox RCA lesion is 100% stenosed.  Prox Cx lesion is 95% stenosed.  Mid  Cx lesion is 95% stenosed.  Mid LAD lesion is 80% stenosed.  SHALIK SANFILIPPO is a 72 y.o. male   [id number] LOCATION:  FACILITY: [facility] PHYSICIAN: Nanetta Batty, M.D. [birth year]   DATE OF PROCEDURE:  12/19/2019  DATE OF DISCHARGE:     CARDIAC CATHETERIZATION    History obtained from chart review.72 year old male with past medical history of diabetes mellitus, hyperlipidemia, restless leg syndrome, benign prostatic hypertrophy,  tobacco abuse admitted with non-ST elevation myocardial infarction.  Echocardiogram shows normal LV function, mild left ventricular hypertrophy, moderate aortic stenosis with mean gradient 22 mmHg.  Impression Mr. Dahlem has diffusely calcified vessels, three-vessel disease and preserved LV function.  He did not have a pullback gradient across the aortic valve.  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  I believe the best therapy would be complete revascularization with CABG.  The sheath was removed and a TR band was placed on the right wrist to achieve patent hemostasis.  The patient left lab in stable condition.  Heparin will be restarted 4 hours after sheath removal.  T CTS will be notified.  Nanetta Batty. MD, [facility] 12/19/2019 8:14 AM  Findings Coronary Findings Diagnostic  Dominance: Right  Left Anterior Descending Mid LAD lesion is 80% stenosed.  Left Circumflex Prox Cx lesion is 95% stenosed. Mid Cx lesion is 95% stenosed.  Right Coronary Artery Ost RCA to Prox RCA lesion is 100% stenosed.  Right Posterior Atrioventricular Artery Collaterals RPAV filled by collaterals from Prox Cx.  First Right Posterolateral Branch Collaterals 1st RPL filled by collaterals from Dist LAD.  Intervention  No interventions have been documented.   STRESS TESTS  NM PET CT CARDIAC PERFUSION MULTI W/ABSOLUTE BLOODFLOW 12/24/2022  Narrative   Severe reversible apical to basal inferior/lateral perfusion defect, consistent with large area of ischemia.  High risk findings including TID (1.27).  Myocardial blood flow reserve not reliable in setting of prior CABG.  Study is high risk and cardiac catheterization is recommended.   LV perfusion is abnormal. There is evidence of ischemia. Defect 1: There is a large defect with severe reduction in uptake present in the apical  to basal inferior and lateral location(s) that is reversible. There is abnormal wall motion in the defect area. Consistent with ischemia.   Rest left ventricular function is abnormal. Rest EF: 44%. Stress left ventricular function is abnormal. Stress EF: 47%. End diastolic cavity size is normal. End systolic cavity size is normal.   Myocardial blood flow reserve is not reported in this patient due to technical or patient-specific concerns that affect accuracy (prior CABG)   Coronary calcium was present on the attenuation correction CT images. Severe coronary calcifications were present. Coronary calcifications were present in the left anterior descending artery, left circumflex artery and right coronary artery distribution(s).   Findings are consistent with ischemia. The study is high risk.   Electronically signed by Epifanio Lesches, MD  CLINICAL DATA:  This over-read does not include interpretation of cardiac or coronary anatomy or pathology. The Cardiac PET CT interpretation by the cardiologist is attached.  COMPARISON:  04/01/2022  FINDINGS: Cardiovascular: Aortic atherosclerosis. Status post aortic valve replacement. Normal heart size. Extensive three-vessel coronary artery calcifications status post median sternotomy and CABG. No pericardial effusion.  Limited Mediastinum/Nodes: No enlarged mediastinal, hilar, or axillary lymph nodes. Small, calcified granulomatous left hilar lymph nodes, benign, requiring no further follow-up or  characterization. Trachea and esophagus demonstrate no significant findings.  Limited Lungs/Pleura: Lungs are clear. No pleural effusion or pneumothorax.  Upper Abdomen: No acute abnormality.  Musculoskeletal: No chest wall abnormality. No acute osseous findings.  IMPRESSION: 1. No acute CT findings in the included chest. 2. Coronary artery disease status post median sternotomy and CABG. 3. Status post aortic valve replacement.  Aortic  Atherosclerosis (ICD10-I70.0).   Electronically Signed By: Jearld Lesch M.D. On: 12/24/2022 09:21   ECHOCARDIOGRAM  ECHOCARDIOGRAM COMPLETE 02/08/2020  Narrative ECHOCARDIOGRAM REPORT    Patient Name:   KAMIN NIBLACK  Date of Exam: 02/08/2020 Medical Rec #:  [id number]     Height:       69.0 in Accession #:    [id number]    Weight:       148.0 lb Date of Birth:  [birth year]     BSA:          1.818 m Patient Age:    69 years      BP:           132/73 mmHg Patient Gender: M             HR:           82 bpm. Exam Location:  [address]  Procedure: 2D Echo, Cardiac Doppler and Color Doppler  Indications:    I35.9* Aortic valve disease, unspecified  History:        Patient has prior history of Echocardiogram examinations, most recent 12/21/2019. Previous Myocardial Infarction, Prior CABG, Signs/Symptoms:Chest Pain; Risk Factors:Hypertension and Diabetes. Aortic stenosis. Aortic valve replacement. Obstructive sleep apnea.  Sonographer:    Cathie Beams RCS Referring Phys: [phone] TRACI R [name]  IMPRESSIONS   1. Left ventricular ejection fraction, by estimation, is 60 to 65%. The left ventricle has normal function. The left ventricle has no regional wall motion abnormalities. There is moderate left ventricular hypertrophy. Left ventricular diastolic parameters are consistent with Grade II diastolic dysfunction (pseudonormalization). Elevated left atrial pressure. 2. Right ventricular systolic function is normal. The right ventricular size is normal. Tricuspid regurgitation signal is inadequate for assessing PA pressure. 3. The mitral valve is normal in structure. Trivial mitral valve regurgitation. 4. There is a 23 mm Inspiris Resilia valve present in the aortic position. Aortic valve regurgitation is not visualized. MG 13 mmHg. EOA 1.6 cm^2. Echo findings are consistent with normal structure and function of the aortic valve prosthesis. 5. The inferior vena cava is normal in size  with greater than 50% respiratory variability, suggesting right atrial pressure of 3 mmHg.  FINDINGS Left Ventricle: Left ventricular ejection fraction, by estimation, is 60 to 65%. The left ventricle has normal function. The left ventricle has no regional wall motion abnormalities. The left ventricular internal cavity size was small. There is moderate left ventricular hypertrophy. Left ventricular diastolic parameters are consistent with Grade II diastolic dysfunction (pseudonormalization). Elevated left atrial pressure.  Right Ventricle: The right ventricular size is normal. Right vetricular wall thickness was not assessed. Right ventricular systolic function is normal. Tricuspid regurgitation signal is inadequate for assessing PA pressure.  Left Atrium: Left atrial size was normal in size.  Right Atrium: Right atrial size was normal in size.  Pericardium: There is no evidence of pericardial effusion.  Mitral Valve: The mitral valve is normal in structure. Trivial mitral valve regurgitation.  Tricuspid Valve: The tricuspid valve is normal in structure. Tricuspid valve regurgitation is trivial.  Aortic Valve: The aortic valve has been repaired/replaced. Aortic valve regurgitation  is not visualized. Aortic valve mean gradient measures 11.5 mmHg. Aortic valve peak gradient measures 23.2 mmHg. Aortic valve area, by VTI measures 1.66 cm. There is a 23 mm Inspiris Resilia valve present in the aortic position. Echo findings are consistent with normal structure and function of the aortic valve prosthesis.  Pulmonic Valve: The pulmonic valve was not well visualized. Pulmonic valve regurgitation is not visualized.  Aorta: The aortic root is normal in size and structure.  Venous: The inferior vena cava is normal in size with greater than 50% respiratory variability, suggesting right atrial pressure of 3 mmHg.  IAS/Shunts: No atrial level shunt detected by color flow Doppler.   LEFT  VENTRICLE PLAX 2D LVIDd:         3.30 cm  Diastology LVIDs:         2.30 cm  LV e' medial:    6.09 cm/s LV PW:         1.50 cm  LV E/e' medial:  19.5 LV IVS:        1.40 cm  LV e' lateral:   7.29 cm/s LVOT diam:     2.00 cm  LV E/e' lateral: 16.3 LV SV:         71 LV SV Index:   39 LVOT Area:     3.14 cm   RIGHT VENTRICLE RV Basal diam:  2.50 cm RV S prime:     12.80 cm/s TAPSE (M-mode): 1.5 cm  LEFT ATRIUM             Index       RIGHT ATRIUM           Index LA diam:        3.30 cm 1.82 cm/m  RA Area:     11.10 cm LA Vol (A2C):   32.9 ml 18.10 ml/m RA Volume:   22.20 ml  12.21 ml/m LA Vol (A4C):   43.7 ml 24.04 ml/m LA Biplane Vol: 37.7 ml 20.74 ml/m AORTIC VALVE AV Area (Vmax):    1.53 cm AV Area (Vmean):   1.47 cm AV Area (VTI):     1.66 cm AV Vmax:           241.00 cm/s AV Vmean:          156.000 cm/s AV VTI:            0.426 m AV Peak Grad:      23.2 mmHg AV Mean Grad:      11.5 mmHg LVOT Vmax:         117.00 cm/s LVOT Vmean:        73.100 cm/s LVOT VTI:          0.225 m LVOT/AV VTI ratio: 0.53  MITRAL VALVE MV Area (PHT): 3.50 cm     SHUNTS MV Decel Time: 217 msec     Systemic VTI:  0.22 m MV E velocity: 119.00 cm/s  Systemic Diam: 2.00 cm MV A velocity: 105.00 cm/s MV E/A ratio:  1.13  Epifanio Lesches MD Electronically signed by Epifanio Lesches MD Signature Date/Time: 02/08/2020/7:13:53 PM    Final   TEE  ECHO INTRAOPERATIVE TEE 12/21/2019  Narrative *INTRAOPERATIVE TRANSESOPHAGEAL REPORT *    Patient Name:   [name]   Date of Exam: 12/21/2019 Medical Rec #:  [id number]      Height:       69.0 in Accession #:    [id number]     Weight:  144.7 lb Date of Birth:  1951/02/24      BSA:          1.80 m Patient Age:    69 years       BP:           125/75 mmHg Patient Gender: M              HR:           58 bpm. Exam Location:  Anesthesiology  Transesophogeal exam was perform intraoperatively during surgical  procedure. Patient was closely monitored under general anesthesia during the entirety of examination.  Indications:     Aortic Stenosis; CAD Native Vessels Sonographer:     Thurman Coyer RDCS (AE) Performing Phys: [id number] Salvatore Decent HENDRICKSON Diagnosing Phys: Gaynelle Adu MD  Complications: No known complications during this procedure. POST-OP IMPRESSIONS - Left Ventricle: The left ventricle is unchanged from pre-bypass. - Aortic Valve: A bioprosthetic valve was placed, leaflets are freely mobile and leaflets thin. There is no regurgitation. The gradient recorded across the prosthetic valve is within the expected range. - Mitral Valve: The mitral valve appears unchanged from pre-bypass. - Tricuspid Valve: The tricuspid valve appears unchanged from pre-bypass.  PRE-OP FINDINGS Left Ventricle: The left ventricle has normal systolic function, with an ejection fraction of 60-65%. The cavity size was normal. There is mildly increased left ventricular wall thickness.  Right Ventricle: The right ventricle has normal systolic function. The cavity was mildly enlarged. There is no increase in right ventricular wall thickness.  Left Atrium: Left atrial size was not assessed. The left atrial appendage is well visualized and there is no evidence of thrombus present.  Right Atrium: Right atrial size was not assessed.  Interatrial Septum: No atrial level shunt detected by color flow Doppler.  Pericardium: There is no evidence of pericardial effusion.  Mitral Valve: The mitral valve is normal in structure. Mitral valve regurgitation is not visualized by color flow Doppler.  Tricuspid Valve: The tricuspid valve was normal in structure. Tricuspid valve regurgitation was not visualized by color flow Doppler.  Aortic Valve: The aortic valve is tricuspid There is moderate thickening of the aortic valve and There is moderate calcification of the aortic valve Aortic valve regurgitation was not  visualized by color flow Doppler. There is moderate stenosis of the aortic valve, with a calculated valve area of 1.07 cm.  Pulmonic Valve: The pulmonic valve was normal in structure. Pulmonic valve regurgitation is not visualized by color flow Doppler.   +--------------+--------++ LEFT VENTRICLE         +--------------+--------++ PLAX 2D                +--------------+--------++ LVIDd:        4.30 cm  +--------------+--------++ LVIDs:        2.50 cm  +--------------+--------++ LVOT diam:    2.00 cm  +--------------+--------++ LV SV:        61 ml    +--------------+--------++ LV SV Index:  34.03    +--------------+--------++ LVOT Area:    3.14 cm +--------------+--------++                        +--------------+--------++  +------------------+------------++ AORTIC VALVE                   +------------------+------------++ AV Area (Vmax):   0.94 cm     +------------------+------------++ AV Area (Vmean):  0.90 cm     +------------------+------------++ AV Area (VTI):  1.07 cm     +------------------+------------++ AV Vmax:          269.00 cm/s  +------------------+------------++ AV Vmean:         168.500 cm/s +------------------+------------++ AV VTI:           0.554 m      +------------------+------------++ AV Peak Grad:     28.9 mmHg    +------------------+------------++ AV Mean Grad:     14.0 mmHg    +------------------+------------++ LVOT Vmax:        80.30 cm/s   +------------------+------------++ LVOT Vmean:       48.500 cm/s  +------------------+------------++ LVOT VTI:         0.189 m      +------------------+------------++ LVOT/AV VTI ratio:0.34         +------------------+------------++   +--------------+-------+ SHUNTS                +--------------+-------+ Systemic VTI: 0.19 m  +--------------+-------+ Systemic Diam:2.00  cm +--------------+-------+   Gaynelle Adu MD Electronically signed by Gaynelle Adu MD Signature Date/Time: 12/21/2019/3:57:24 PM    Final            Risk Assessment/Calculations:             Physical Exam:   VS:  BP (!) 96/56   Pulse 69   Ht 5\' 9"  (1.753 m)   Wt 162 lb (73.5 kg)   SpO2 96%   BMI 23.92 kg/m    Wt Readings from Last 3 Encounters:  12/29/22 162 lb (73.5 kg)  12/18/22 162 lb (73.5 kg)  08/13/22 164 lb 9.6 oz (74.7 kg)    GEN: Well nourished, well developed in no acute distress. Sitting comfortably on the exam table  NECK: No JVD CARDIAC: RRR. Faint systolic murmur at RUSB. Radial pulses 2+ bilaterally  RESPIRATORY:  Clear to auscultation without rales, wheezing or rhonchi. Normal work of breathing on room air  ABDOMEN: Soft, non-tender, non-distended EXTREMITIES:  No edema in BLE; No deformity   ASSESSMENT AND PLAN: .    CAD  Abnormal Stress Test  Stable Angina  - Patient previously underwent CABG x5 in 2021 with LIMA-LAD, SVG-D1, sequential SVG-OM1-OM2, and SVG-PDA - Most recently underwent cardiac catheterization 04/11/22 that showed patent LIMA-LAD, total occlusion of SVG-OM1-OM2. Severe stenosis at the ostial of SVG-PDA that was treated with DES. Severe stenosis at the ostium of SVG-diagonal that was treated with DES  -In the past few months, patient has developed chest pain on exertion.  He likes to walk every morning, and has had to reduce his pace due to chest pain.  He has had 1 episode of chest pain at rest that went away with nitroglycerin - Patient recently underwent Cardiac PET on 12/24/22 that showed a severe reversible apical to basal inferior/lateral perfusion defect consistent with a large area of ischemia. Overall study was high risk and recommended cardiac catheterization  - Patient continues to have exertional chest pain. Only had 1 episode of chest pain at rest that was relieved with 1 nitroglycerine. Has not had recurrence of  chest pain at rest. Discussed ED precautions, and encouraged him to go to ED if chest pain does not go away with nitroglycerine or if pain wakes him up from sleep.  - Ordered LHC.  Confirmed with Dr. Jens Som. His last cath was done by Dr. Excell Seltzer and he would prefer if Dr. Excell Seltzer does this catheterization.  Scheduled for tomorrow 10/15 -Ordered BMP, CBC, COVID screen prior to cardiac catheterization - EKG today  unchanged compared to EKG 10/3  - LDL is well controlled. Ordered LP(a)  -Continue aspirin 81 mg daily, Plavix 75 mg daily -Continue Lipitor 80 mg daily -Continue Imdur 30 mg daily, ranolazine 500 mg twice daily  HLD  - Lipid panel from 08/2022 showed LDL 52 - Continue lipitor 80 mg daily  -Checking lipoprotein a as above  S/p AVR  - Patient underwent AVR in 2021 at the time of CABG  - Most recent echocardiogram from 02/08/20 showed EF 60-65%, grade II DD. Echo findings consistent with normal structure and function of the aortic valve prosthesis  - Ordered echocardiogram for monitoring   HTN  - BP low end of normal today. He denies dizziness, near syncope - Discussed reducing BP medications, but patient reports that he is feeling fine and prefers to continue   - Continue current BP medications   Informed Consent   Shared Decision Making/Informed Consent{  The risks [stroke (1 in 1000), death (1 in 1000), kidney failure [usually temporary] (1 in 500), bleeding (1 in 200), allergic reaction [possibly serious] (1 in 200)], benefits (diagnostic support and management of coronary artery disease) and alternatives of a cardiac catheterization were discussed in detail with Mr. Dohner and he is willing to proceed.     Dispo: Follow up in 2-3 weeks after cath   Signed, Jonita Albee, PA-C

## 2022-12-27 NOTE — Progress Notes (Unsigned)
Cardiology Office Note:  .   Date:  12/29/2022  ID:  Steven Lawrence, DOB [birth year], MRN [id number] PCP: Milus Height, PA  [facility] Providers Cardiologist:  Olga Millers, MD Cardiology APP:  Marcelino Duster, PA    History of Present Illness: .   Steven Lawrence is a 72 y.o. male with past medical history of CAD s/p CABG and aortic valve replacement 2021, hyperlipidemia, type 2 diabetes, OSA, tobacco use.  Patient is followed by Dr. Jens Som and presents today for follow-up appointment after an abnormal cardiac PET scan.  Per chart review, patient was hospitalized in 12/2019 for NSTEMI.  Echocardiogram 12/17/2019 showed EF 60-65%, no regional wall motion abnormalities, normal RV function, moderate aortic valve stenosis.  Left heart catheterization on 12/19/2019 showed diffusely calcified vessels, three-vessel disease, preserved LV function.  His RCA was totally occluded and he had high-grade calcified tandem AV groove lesions in the mid LAD lesion.  Recommended evaluation for CABG.  He was seen by CT surgery and ultimately underwent CABG x 5 (LIMA-LAD, SVG-D1, sequential SVG-OM2-OM3, and SVG-PDA) and aortic valve replacement with a bioprosthetic pericardial valve.  Pre-CABG Dopplers showed 1-39% bilateral ICA disease.  After surgery, patient did have atrial fibrillation that was treated with amiodarone and Eliquis.  Follow-up echocardiogram in 01/2020 showed normal EF, grade 2 DD, stable aortic valve replacement.  Patient underwent stress test on 02/2022 which revealed a large reversible perfusion defect in the lateral and basal-mid inferior LV segment.  He underwent cardiac catheterization on 04/05/2022 which showed patent LIMA-LAD, total occlusion of SVG to OM1 and OM 2, severe stenosis of the ostium of the SVG-diagonal which was treated with DES, and severe stenosis at the ostium of the SVG-PDA which was treated with DES.  Patient was seen by Azalee Course PA-C in clinic on 12/18/2022.  At  that time, patient reported a stop sternal chest ache that occurred with exertion.  Denied chest pain at rest.  He had been compliant with his aspirin, Plavix.  He had been having chest pain daily as he walked 3 miles every morning.  Patient was offered stress test versus cardiac catheterization, and he preferred to undergo stress testing.  He underwent cardiac PET scan on 12/24/2022 that showed severe reversible apical to basal inferior/lateral perfusion defect, consistent with large area of ischemia.  Study was high risk and recommended cardiac catheterization.  Today, patient reports that he came to discuss his recent nuclear stress test.  He understands that his stress test was abnormal and showed an area of ischemia, understands that cardiac catheterization was recommended.  Reports that he is continue to have some chest pain on exertion.  He likes to walk in the mornings, usually for 45 minutes/day.  He has had to slow his pace due to chest pain.  Chest pain relieved with rest.  Last week, he had 1 episode of chest pain that occurred while he was at rest.  Chest pain improved with nitroglycerin.  He has not had recurrence of chest pain at rest.  He denies shortness of breath, dizziness, syncope, near syncope, ankle edema.  He is somewhat frustrated because he is following a healthy diet, his cholesterol is well-controlled, and he exercises regularly.  However, he continues to have issues with coronary artery disease and chest pain.  He has a family history of heart problems.  Also has family history of diabetes, HTN.  He is in agreement with cardiac catheterization.  Would prefer if Dr. Excell Seltzer can do  his cath as he did the last one.    Studies Reviewed: .    Cardiac Studies & Procedures   CARDIAC CATHETERIZATION  CARDIAC CATHETERIZATION 04/11/2022  Narrative   1st Diag lesion is 100% stenosed.   Origin to Prox Graft lesion before 1st Mrg  is 100% stenosed.   Seq SVG- OM1 and OM2.  Severe  multivessel CAD with severe LAD stenosis, total occlusion of the LCx (new from prior cath when there was severe calcific stenosis), and total occlusion of the RCA (chronic) S/P CABG with continued patency of the LIMA-LAD without stenosis Total occlusion of the sequential graft to OM1 and OM2 Severe stenosis at the ostium of the SVG-diagonal, treated successfully with a 3.5x16 mm Synergy DES Severe stenosis at the ostium of the SVG-PDA, treated successfully with a 3.5x16 mm Synergy DES Likely normal function of the aortic bioprosthesis, crossed with J-wire, pseudo-gradient from catheter whip artifact  Recommend: Same day PCI protocol if criteria met. ASA/clopidogrel x 12 months favor long-term if tolerated  Findings Coronary Findings Diagnostic  Dominance: Right  Left Anterior Descending Mid LAD lesion is 80% stenosed.  First Diagonal Branch 1st Diag lesion is 100% stenosed.  Left Circumflex Prox Cx lesion is 100% stenosed.  Third Obtuse Marginal Branch Collaterals 3rd Mrg filled by collaterals from 1st Sept.  Right Coronary Artery Ost RCA to Prox RCA lesion is 100% stenosed.  Right Posterior Atrioventricular Artery Collaterals RPAV filled by collaterals from Prox Cx.  First Right Posterolateral Branch Collaterals 1st RPL filled by collaterals from Dist LAD.  LIMA Graft To Dist LAD Graft was visualized by angiography and is normal in caliber.  The graft exhibits no disease.  Graft To 1st Diag Origin to Prox Graft lesion is 90% stenosed. The lesion is calcified.  Graft To RPDA Origin lesion is 90% stenosed. Focal ostial lesion with severe pressure dampening  Sequential Jump Graft Graft To 1st Mrg, 2nd Mrg Seq SVG- OM1 and OM2.  Chronic occlusion at origin of graft Origin to Prox Graft lesion before 1st Mrg  is 100% stenosed.  Intervention  Origin to Prox Graft lesion (Graft To 1st Diag) Stent CATHETER LAUNCHER 73F MPST guide catheter was inserted. Lesion crossed  with guidewire using a WIRE COUGAR XT STRL 190CM. Pre-stent angioplasty was performed using a BALLN SAPPHIRE 3.0X12. Maximum pressure:  14 atm. A drug-eluting stent was successfully placed using a SYNERGY XD 3.50X16. Stent does not overlap previously placed stentPost-stent angioplasty was not performed. The lesion was resistant to balloon angioplasty but yielded at 14 atm. Post-Intervention Lesion Assessment The intervention was successful. Pre-interventional TIMI flow is 3. Post-intervention TIMI flow is 3. No complications occurred at this lesion. There is a 0% residual stenosis post intervention.  Origin lesion (Graft To RPDA) Stent CATHETER LAUNCHER 73F MPST guide catheter was inserted. Lesion crossed with guidewire using a WIRE COUGAR XT STRL 190CM. Pre-stent angioplasty was performed using a BALLN SAPPHIRE 3.0X12. A drug-eluting stent was successfully placed using a SYNERGY XD 3.50X16. Post-stent angioplasty was not performed. Stent covers ostium, TIMI-3 flow pre and post-PCI. Graft pre-treated with IC verapamil. Post-Intervention Lesion Assessment The intervention was successful. Pre-interventional TIMI flow is 3. Post-intervention TIMI flow is 3. No complications occurred at this lesion. There is a 0% residual stenosis post intervention.   CARDIAC CATHETERIZATION  CARDIAC CATHETERIZATION 12/19/2019  Narrative Images from the original result were not included.   Ost RCA to Prox RCA lesion is 100% stenosed.  Prox Cx lesion is 95% stenosed.  Mid  Cx lesion is 95% stenosed.  Mid LAD lesion is 80% stenosed.  Steven Lawrence is a 72 y.o. male   [id number] LOCATION:  FACILITY: [facility] PHYSICIAN: Nanetta Batty, M.D. 1951/01/13   DATE OF PROCEDURE:  12/19/2019  DATE OF DISCHARGE:     CARDIAC CATHETERIZATION    History obtained from chart review.72 year old male with past medical history of diabetes mellitus, hyperlipidemia, restless leg syndrome, benign prostatic hypertrophy,  tobacco abuse admitted with non-ST elevation myocardial infarction.  Echocardiogram shows normal LV function, mild left ventricular hypertrophy, moderate aortic stenosis with mean gradient 22 mmHg.  Impression Mr. Foye has diffusely calcified vessels, three-vessel disease and preserved LV function.  He did not have a pullback gradient across the aortic valve.  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  I believe the best therapy would be complete revascularization with CABG.  The sheath was removed and a TR band was placed on the right wrist to achieve patent hemostasis.  The patient left lab in stable condition.  Heparin will be restarted 4 hours after sheath removal.  T CTS will be notified.  Nanetta Batty. MD, [facility] 12/19/2019 8:14 AM  Findings Coronary Findings Diagnostic  Dominance: Right  Left Anterior Descending Mid LAD lesion is 80% stenosed.  Left Circumflex Prox Cx lesion is 95% stenosed. Mid Cx lesion is 95% stenosed.  Right Coronary Artery Ost RCA to Prox RCA lesion is 100% stenosed.  Right Posterior Atrioventricular Artery Collaterals RPAV filled by collaterals from Prox Cx.  First Right Posterolateral Branch Collaterals 1st RPL filled by collaterals from Dist LAD.  Intervention  No interventions have been documented.   STRESS TESTS  NM PET CT CARDIAC PERFUSION MULTI W/ABSOLUTE BLOODFLOW 12/24/2022  Narrative   Severe reversible apical to basal inferior/lateral perfusion defect, consistent with large area of ischemia.  High risk findings including TID (1.27).  Myocardial blood flow reserve not reliable in setting of prior CABG.  Study is high risk and cardiac catheterization is recommended.   LV perfusion is abnormal. There is evidence of ischemia. Defect 1: There is a large defect with severe reduction in uptake present in the apical  to basal inferior and lateral location(s) that is reversible. There is abnormal wall motion in the defect area. Consistent with ischemia.   Rest left ventricular function is abnormal. Rest EF: 44%. Stress left ventricular function is abnormal. Stress EF: 47%. End diastolic cavity size is normal. End systolic cavity size is normal.   Myocardial blood flow reserve is not reported in this patient due to technical or patient-specific concerns that affect accuracy (prior CABG)   Coronary calcium was present on the attenuation correction CT images. Severe coronary calcifications were present. Coronary calcifications were present in the left anterior descending artery, left circumflex artery and right coronary artery distribution(s).   Findings are consistent with ischemia. The study is high risk.   Electronically signed by Epifanio Lesches, MD  CLINICAL DATA:  This over-read does not include interpretation of cardiac or coronary anatomy or pathology. The Cardiac PET CT interpretation by the cardiologist is attached.  COMPARISON:  04/01/2022  FINDINGS: Cardiovascular: Aortic atherosclerosis. Status post aortic valve replacement. Normal heart size. Extensive three-vessel coronary artery calcifications status post median sternotomy and CABG. No pericardial effusion.  Limited Mediastinum/Nodes: No enlarged mediastinal, hilar, or axillary lymph nodes. Small, calcified granulomatous left hilar lymph nodes, benign, requiring no further follow-up or  characterization. Trachea and esophagus demonstrate no significant findings.  Limited Lungs/Pleura: Lungs are clear. No pleural effusion or pneumothorax.  Upper Abdomen: No acute abnormality.  Musculoskeletal: No chest wall abnormality. No acute osseous findings.  IMPRESSION: 1. No acute CT findings in the included chest. 2. Coronary artery disease status post median sternotomy and CABG. 3. Status post aortic valve replacement.  Aortic  Atherosclerosis (ICD10-I70.0).   Electronically Signed By: Jearld Lesch M.D. On: 12/24/2022 09:21   ECHOCARDIOGRAM  ECHOCARDIOGRAM COMPLETE 02/08/2020  Narrative ECHOCARDIOGRAM REPORT    Patient Name:   Steven Lawrence  Date of Exam: 02/08/2020 Medical Rec #:  [id number]     Height:       69.0 in Accession #:    [id number]    Weight:       148.0 lb Date of Birth:  07/06/1950     BSA:          1.818 m Patient Age:    69 years      BP:           132/73 mmHg Patient Gender: M             HR:           82 bpm. Exam Location:  [address]  Procedure: 2D Echo, Cardiac Doppler and Color Doppler  Indications:    I35.9* Aortic valve disease, unspecified  History:        Patient has prior history of Echocardiogram examinations, most recent 12/21/2019. Previous Myocardial Infarction, Prior CABG, Signs/Symptoms:Chest Pain; Risk Factors:Hypertension and Diabetes. Aortic stenosis. Aortic valve replacement. Obstructive sleep apnea.  Sonographer:    Cathie Beams RCS Referring Phys: [phone] TRACI R [name]  IMPRESSIONS   1. Left ventricular ejection fraction, by estimation, is 60 to 65%. The left ventricle has normal function. The left ventricle has no regional wall motion abnormalities. There is moderate left ventricular hypertrophy. Left ventricular diastolic parameters are consistent with Grade II diastolic dysfunction (pseudonormalization). Elevated left atrial pressure. 2. Right ventricular systolic function is normal. The right ventricular size is normal. Tricuspid regurgitation signal is inadequate for assessing PA pressure. 3. The mitral valve is normal in structure. Trivial mitral valve regurgitation. 4. There is a 23 mm Inspiris Resilia valve present in the aortic position. Aortic valve regurgitation is not visualized. MG 13 mmHg. EOA 1.6 cm^2. Echo findings are consistent with normal structure and function of the aortic valve prosthesis. 5. The inferior vena cava is normal in size  with greater than 50% respiratory variability, suggesting right atrial pressure of 3 mmHg.  FINDINGS Left Ventricle: Left ventricular ejection fraction, by estimation, is 60 to 65%. The left ventricle has normal function. The left ventricle has no regional wall motion abnormalities. The left ventricular internal cavity size was small. There is moderate left ventricular hypertrophy. Left ventricular diastolic parameters are consistent with Grade II diastolic dysfunction (pseudonormalization). Elevated left atrial pressure.  Right Ventricle: The right ventricular size is normal. Right vetricular wall thickness was not assessed. Right ventricular systolic function is normal. Tricuspid regurgitation signal is inadequate for assessing PA pressure.  Left Atrium: Left atrial size was normal in size.  Right Atrium: Right atrial size was normal in size.  Pericardium: There is no evidence of pericardial effusion.  Mitral Valve: The mitral valve is normal in structure. Trivial mitral valve regurgitation.  Tricuspid Valve: The tricuspid valve is normal in structure. Tricuspid valve regurgitation is trivial.  Aortic Valve: The aortic valve has been repaired/replaced. Aortic valve regurgitation  is not visualized. Aortic valve mean gradient measures 11.5 mmHg. Aortic valve peak gradient measures 23.2 mmHg. Aortic valve area, by VTI measures 1.66 cm. There is a 23 mm Inspiris Resilia valve present in the aortic position. Echo findings are consistent with normal structure and function of the aortic valve prosthesis.  Pulmonic Valve: The pulmonic valve was not well visualized. Pulmonic valve regurgitation is not visualized.  Aorta: The aortic root is normal in size and structure.  Venous: The inferior vena cava is normal in size with greater than 50% respiratory variability, suggesting right atrial pressure of 3 mmHg.  IAS/Shunts: No atrial level shunt detected by color flow Doppler.   LEFT  VENTRICLE PLAX 2D LVIDd:         3.30 cm  Diastology LVIDs:         2.30 cm  LV e' medial:    6.09 cm/s LV PW:         1.50 cm  LV E/e' medial:  19.5 LV IVS:        1.40 cm  LV e' lateral:   7.29 cm/s LVOT diam:     2.00 cm  LV E/e' lateral: 16.3 LV SV:         71 LV SV Index:   39 LVOT Area:     3.14 cm   RIGHT VENTRICLE RV Basal diam:  2.50 cm RV S prime:     12.80 cm/s TAPSE (M-mode): 1.5 cm  LEFT ATRIUM             Index       RIGHT ATRIUM           Index LA diam:        3.30 cm 1.82 cm/m  RA Area:     11.10 cm LA Vol (A2C):   32.9 ml 18.10 ml/m RA Volume:   22.20 ml  12.21 ml/m LA Vol (A4C):   43.7 ml 24.04 ml/m LA Biplane Vol: 37.7 ml 20.74 ml/m AORTIC VALVE AV Area (Vmax):    1.53 cm AV Area (Vmean):   1.47 cm AV Area (VTI):     1.66 cm AV Vmax:           241.00 cm/s AV Vmean:          156.000 cm/s AV VTI:            0.426 m AV Peak Grad:      23.2 mmHg AV Mean Grad:      11.5 mmHg LVOT Vmax:         117.00 cm/s LVOT Vmean:        73.100 cm/s LVOT VTI:          0.225 m LVOT/AV VTI ratio: 0.53  MITRAL VALVE MV Area (PHT): 3.50 cm     SHUNTS MV Decel Time: 217 msec     Systemic VTI:  0.22 m MV E velocity: 119.00 cm/s  Systemic Diam: 2.00 cm MV A velocity: 105.00 cm/s MV E/A ratio:  1.13  Epifanio Lesches MD Electronically signed by Epifanio Lesches MD Signature Date/Time: 02/08/2020/7:13:53 PM    Final   TEE  ECHO INTRAOPERATIVE TEE 12/21/2019  Narrative *INTRAOPERATIVE TRANSESOPHAGEAL REPORT *    Patient Name:   Steven Lawrence   Date of Exam: 12/21/2019 Medical Rec #:  [id number]      Height:       69.0 in Accession #:    [id number]     Weight:  144.7 lb Date of Birth:  1951-01-27      BSA:          1.80 m Patient Age:    69 years       BP:           125/75 mmHg Patient Gender: M              HR:           58 bpm. Exam Location:  Anesthesiology  Transesophogeal exam was perform intraoperatively during surgical  procedure. Patient was closely monitored under general anesthesia during the entirety of examination.  Indications:     Aortic Stenosis; CAD Native Vessels Sonographer:     Thurman Coyer RDCS (AE) Performing Phys: [id number] Salvatore Decent HENDRICKSON Diagnosing Phys: Gaynelle Adu MD  Complications: No known complications during this procedure. POST-OP IMPRESSIONS - Left Ventricle: The left ventricle is unchanged from pre-bypass. - Aortic Valve: A bioprosthetic valve was placed, leaflets are freely mobile and leaflets thin. There is no regurgitation. The gradient recorded across the prosthetic valve is within the expected range. - Mitral Valve: The mitral valve appears unchanged from pre-bypass. - Tricuspid Valve: The tricuspid valve appears unchanged from pre-bypass.  PRE-OP FINDINGS Left Ventricle: The left ventricle has normal systolic function, with an ejection fraction of 60-65%. The cavity size was normal. There is mildly increased left ventricular wall thickness.  Right Ventricle: The right ventricle has normal systolic function. The cavity was mildly enlarged. There is no increase in right ventricular wall thickness.  Left Atrium: Left atrial size was not assessed. The left atrial appendage is well visualized and there is no evidence of thrombus present.  Right Atrium: Right atrial size was not assessed.  Interatrial Septum: No atrial level shunt detected by color flow Doppler.  Pericardium: There is no evidence of pericardial effusion.  Mitral Valve: The mitral valve is normal in structure. Mitral valve regurgitation is not visualized by color flow Doppler.  Tricuspid Valve: The tricuspid valve was normal in structure. Tricuspid valve regurgitation was not visualized by color flow Doppler.  Aortic Valve: The aortic valve is tricuspid There is moderate thickening of the aortic valve and There is moderate calcification of the aortic valve Aortic valve regurgitation was not  visualized by color flow Doppler. There is moderate stenosis of the aortic valve, with a calculated valve area of 1.07 cm.  Pulmonic Valve: The pulmonic valve was normal in structure. Pulmonic valve regurgitation is not visualized by color flow Doppler.   +--------------+--------++ LEFT VENTRICLE         +--------------+--------++ PLAX 2D                +--------------+--------++ LVIDd:        4.30 cm  +--------------+--------++ LVIDs:        2.50 cm  +--------------+--------++ LVOT diam:    2.00 cm  +--------------+--------++ LV SV:        61 ml    +--------------+--------++ LV SV Index:  34.03    +--------------+--------++ LVOT Area:    3.14 cm +--------------+--------++                        +--------------+--------++  +------------------+------------++ AORTIC VALVE                   +------------------+------------++ AV Area (Vmax):   0.94 cm     +------------------+------------++ AV Area (Vmean):  0.90 cm     +------------------+------------++ AV Area (VTI):  1.07 cm     +------------------+------------++ AV Vmax:          269.00 cm/s  +------------------+------------++ AV Vmean:         168.500 cm/s +------------------+------------++ AV VTI:           0.554 m      +------------------+------------++ AV Peak Grad:     28.9 mmHg    +------------------+------------++ AV Mean Grad:     14.0 mmHg    +------------------+------------++ LVOT Vmax:        80.30 cm/s   +------------------+------------++ LVOT Vmean:       48.500 cm/s  +------------------+------------++ LVOT VTI:         0.189 m      +------------------+------------++ LVOT/AV VTI ratio:0.34         +------------------+------------++   +--------------+-------+ SHUNTS                +--------------+-------+ Systemic VTI: 0.19 m  +--------------+-------+ Systemic Diam:2.00  cm +--------------+-------+   Gaynelle Adu MD Electronically signed by Gaynelle Adu MD Signature Date/Time: 12/21/2019/3:57:24 PM    Final            Risk Assessment/Calculations:             Physical Exam:   VS:  BP (!) 96/56   Pulse 69   Ht 5\' 9"  (1.753 m)   Wt 162 lb (73.5 kg)   SpO2 96%   BMI 23.92 kg/m    Wt Readings from Last 3 Encounters:  12/29/22 162 lb (73.5 kg)  12/18/22 162 lb (73.5 kg)  08/13/22 164 lb 9.6 oz (74.7 kg)    GEN: Well nourished, well developed in no acute distress. Sitting comfortably on the exam table  NECK: No JVD CARDIAC: RRR. Faint systolic murmur at RUSB. Radial pulses 2+ bilaterally  RESPIRATORY:  Clear to auscultation without rales, wheezing or rhonchi. Normal work of breathing on room air  ABDOMEN: Soft, non-tender, non-distended EXTREMITIES:  No edema in BLE; No deformity   ASSESSMENT AND PLAN: .    CAD  Abnormal Stress Test  Stable Angina  - Patient previously underwent CABG x5 in 2021 with LIMA-LAD, SVG-D1, sequential SVG-OM1-OM2, and SVG-PDA - Most recently underwent cardiac catheterization 04/11/22 that showed patent LIMA-LAD, total occlusion of SVG-OM1-OM2. Severe stenosis at the ostial of SVG-PDA that was treated with DES. Severe stenosis at the ostium of SVG-diagonal that was treated with DES  -In the past few months, patient has developed chest pain on exertion.  He likes to walk every morning, and has had to reduce his pace due to chest pain.  He has had 1 episode of chest pain at rest that went away with nitroglycerin - Patient recently underwent Cardiac PET on 12/24/22 that showed a severe reversible apical to basal inferior/lateral perfusion defect consistent with a large area of ischemia. Overall study was high risk and recommended cardiac catheterization  - Patient continues to have exertional chest pain. Only had 1 episode of chest pain at rest that was relieved with 1 nitroglycerine. Has not had recurrence of  chest pain at rest. Discussed ED precautions, and encouraged him to go to ED if chest pain does not go away with nitroglycerine or if pain wakes him up from sleep.  - Ordered LHC.  Confirmed with Dr. Jens Som. His last cath was done by Dr. Excell Seltzer and he would prefer if Dr. Excell Seltzer does this catheterization.  Scheduled for tomorrow 10/15 -Ordered BMP, CBC, COVID screen prior to cardiac catheterization - EKG today  unchanged compared to EKG 10/3  - LDL is well controlled. Ordered LP(a)  -Continue aspirin 81 mg daily, Plavix 75 mg daily -Continue Lipitor 80 mg daily -Continue Imdur 30 mg daily, ranolazine 500 mg twice daily  HLD  - Lipid panel from 08/2022 showed LDL 52 - Continue lipitor 80 mg daily  -Checking lipoprotein a as above  S/p AVR  - Patient underwent AVR in 2021 at the time of CABG  - Most recent echocardiogram from 02/08/20 showed EF 60-65%, grade II DD. Echo findings consistent with normal structure and function of the aortic valve prosthesis  - Ordered echocardiogram for monitoring   HTN  - BP low end of normal today. He denies dizziness, near syncope - Discussed reducing BP medications, but patient reports that he is feeling fine and prefers to continue   - Continue current BP medications   Informed Consent   Shared Decision Making/Informed Consent{  The risks [stroke (1 in 1000), death (1 in 1000), kidney failure [usually temporary] (1 in 500), bleeding (1 in 200), allergic reaction [possibly serious] (1 in 200)], benefits (diagnostic support and management of coronary artery disease) and alternatives of a cardiac catheterization were discussed in detail with Mr. Rozak and he is willing to proceed.     Dispo: Follow up in 2-3 weeks after cath   Signed, Jonita Albee, PA-C

## 2022-12-29 ENCOUNTER — Encounter: Payer: Self-pay | Admitting: Cardiology

## 2022-12-29 ENCOUNTER — Ambulatory Visit: Payer: Medicare Other | Attending: Cardiology | Admitting: Cardiology

## 2022-12-29 VITALS — BP 96/56 | HR 69 | Ht 69.0 in | Wt 162.0 lb

## 2022-12-29 DIAGNOSIS — E785 Hyperlipidemia, unspecified: Secondary | ICD-10-CM | POA: Diagnosis not present

## 2022-12-29 DIAGNOSIS — I25119 Atherosclerotic heart disease of native coronary artery with unspecified angina pectoris: Secondary | ICD-10-CM

## 2022-12-29 DIAGNOSIS — Z01810 Encounter for preprocedural cardiovascular examination: Secondary | ICD-10-CM | POA: Diagnosis not present

## 2022-12-29 DIAGNOSIS — Z952 Presence of prosthetic heart valve: Secondary | ICD-10-CM

## 2022-12-29 DIAGNOSIS — R9439 Abnormal result of other cardiovascular function study: Secondary | ICD-10-CM

## 2022-12-29 DIAGNOSIS — Z951 Presence of aortocoronary bypass graft: Secondary | ICD-10-CM | POA: Diagnosis not present

## 2022-12-29 DIAGNOSIS — I10 Essential (primary) hypertension: Secondary | ICD-10-CM

## 2022-12-29 NOTE — Patient Instructions (Addendum)
Medication Instructions:  No Changes *If you need a refill on your cardiac medications before your next appointment, please call your pharmacy*   Lab Work: Today: CBC, LPA and Bmet. If you have labs (blood work) drawn today and your tests are completely normal, you will receive your results only by: MyChart Message (if you have MyChart) OR A paper copy in the mail If you have any lab test that is abnormal or we need to change your treatment, we will call you to review the results.   Testing/Procedures:  [facility] [facility] DEPT OF [facility] AT [facility] [address] [place] [address] Dept: [phone] Loc: [phone]  BETTIE SWAVELY  12/29/2022  You are scheduled for a Cardiac Catheterization on Tuesday, October 15 with Dr. Tonny Bollman.  1. Please arrive at the [facility] (Main Entrance A) at [facility]: [address] [place], [place] [address] at 11:30 AM (This time is 2 hour(s) before your procedure to ensure your preparation). Free valet parking service is available. You will check in at ADMITTING. The support person will be asked to wait in the waiting room.  It is OK to have someone drop you off and come back when you are ready to be discharged.    Special note: Every effort is made to have your procedure done on time. Please understand that emergencies sometimes delay scheduled procedures.  2. Diet: Do not eat solid foods after midnight.  The patient may have clear liquids until 5am upon the day of the procedure.  3. Labs: You will need to have blood drawn on today.  4. Medication instructions in preparation for your procedure:   Contrast Allergy: No    Stop Nitroglycerin the morning of procedure Stop Taking Invokana morning of procedure  Take only 10 units of insulin the night before your procedure. Do not take any insulin on the day of the procedure.  Do not take Diabetes Med  Glucophage (Metformin) on the day of the procedure and HOLD 48 HOURS AFTER THE PROCEDURE.  On the morning of your procedure, take your Aspirin 81 mg and any morning medicines NOT listed above.  You may use sips of water.  5. Plan to go home the same day, you will only stay overnight if medically necessary. 6. Bring a current list of your medications and current insurance cards. 7. You MUST have a responsible person to drive you home. 8. Someone MUST be with you the first 24 hours after you arrive home or your discharge will be delayed. 9. Please wear clothes that are easy to get on and off and wear slip-on shoes.  Thank you for allowing [place] to care for you!   -- [facility]    Follow-Up: At [facility], you and your health needs are our priority.  As part of our continuing mission to provide you with exceptional heart care, we have created designated Provider Care Teams.  These Care Teams include your primary Cardiologist (physician) and Advanced Practice Providers (APPs -  Physician Assistants and Nurse Practitioners) who all work together to provide you with the care you need, when you need it.  We recommend signing up for the patient portal called "MyChart".  Sign up information is provided on this After Visit Summary.  MyChart is used to connect with patients for Virtual Visits (Telemedicine).  Patients are able to view lab/test results, encounter notes, upcoming appointments,  etc.  Non-urgent messages can be sent to your provider as well.   To learn more about what you can do with MyChart, go to [email or web address].    Your next appointment:   2-3 week(s)  Provider:   Robet Leu, PA-C or Azalee Course, PA-C

## 2022-12-30 ENCOUNTER — Encounter (HOSPITAL_COMMUNITY)
Admission: RE | Disposition: A | Discharge status: Home / Self Care | Payer: Self-pay | Source: Home / Self Care | Attending: Cardiovascular Disease

## 2022-12-30 ENCOUNTER — Ambulatory Visit (HOSPITAL_COMMUNITY)
Admission: RE | Admit: 2022-12-30 | Discharge: 2022-12-30 | Disposition: A | Discharge status: Home / Self Care | Payer: Medicare Other | Attending: Cardiovascular Disease | Admitting: Cardiovascular Disease

## 2022-12-30 ENCOUNTER — Other Ambulatory Visit: Payer: Self-pay

## 2022-12-30 DIAGNOSIS — Z7901 Long term (current) use of anticoagulants: Secondary | ICD-10-CM | POA: Diagnosis not present

## 2022-12-30 DIAGNOSIS — I2582 Chronic total occlusion of coronary artery: Secondary | ICD-10-CM | POA: Insufficient documentation

## 2022-12-30 DIAGNOSIS — G4733 Obstructive sleep apnea (adult) (pediatric): Secondary | ICD-10-CM | POA: Insufficient documentation

## 2022-12-30 DIAGNOSIS — I25709 Atherosclerosis of coronary artery bypass graft(s), unspecified, with unspecified angina pectoris: Secondary | ICD-10-CM | POA: Diagnosis not present

## 2022-12-30 DIAGNOSIS — I4891 Unspecified atrial fibrillation: Secondary | ICD-10-CM | POA: Insufficient documentation

## 2022-12-30 DIAGNOSIS — E119 Type 2 diabetes mellitus without complications: Secondary | ICD-10-CM | POA: Diagnosis not present

## 2022-12-30 DIAGNOSIS — I252 Old myocardial infarction: Secondary | ICD-10-CM | POA: Insufficient documentation

## 2022-12-30 DIAGNOSIS — Z7902 Long term (current) use of antithrombotics/antiplatelets: Secondary | ICD-10-CM | POA: Diagnosis not present

## 2022-12-30 DIAGNOSIS — Z953 Presence of xenogenic heart valve: Secondary | ICD-10-CM | POA: Diagnosis not present

## 2022-12-30 DIAGNOSIS — I25119 Atherosclerotic heart disease of native coronary artery with unspecified angina pectoris: Secondary | ICD-10-CM

## 2022-12-30 DIAGNOSIS — Z833 Family history of diabetes mellitus: Secondary | ICD-10-CM | POA: Diagnosis not present

## 2022-12-30 DIAGNOSIS — Z7982 Long term (current) use of aspirin: Secondary | ICD-10-CM | POA: Diagnosis not present

## 2022-12-30 DIAGNOSIS — E785 Hyperlipidemia, unspecified: Secondary | ICD-10-CM | POA: Diagnosis not present

## 2022-12-30 DIAGNOSIS — R9439 Abnormal result of other cardiovascular function study: Secondary | ICD-10-CM | POA: Diagnosis present

## 2022-12-30 DIAGNOSIS — Z79899 Other long term (current) drug therapy: Secondary | ICD-10-CM | POA: Insufficient documentation

## 2022-12-30 DIAGNOSIS — Z87891 Personal history of nicotine dependence: Secondary | ICD-10-CM | POA: Diagnosis not present

## 2022-12-30 DIAGNOSIS — Z8249 Family history of ischemic heart disease and other diseases of the circulatory system: Secondary | ICD-10-CM | POA: Insufficient documentation

## 2022-12-30 DIAGNOSIS — I082 Rheumatic disorders of both aortic and tricuspid valves: Secondary | ICD-10-CM | POA: Insufficient documentation

## 2022-12-30 DIAGNOSIS — I2584 Coronary atherosclerosis due to calcified coronary lesion: Secondary | ICD-10-CM | POA: Insufficient documentation

## 2022-12-30 HISTORY — PX: CORONARY ANGIOGRAPHY: CATH118303

## 2022-12-30 HISTORY — PX: BYPASS GRAFT ANGIOGRAPHY: CATH118229

## 2022-12-30 LAB — GLUCOSE, CAPILLARY
Glucose-Capillary: 113 mg/dL — ABNORMAL HIGH (ref 70–99)
Glucose-Capillary: 96 mg/dL (ref 70–99)

## 2022-12-30 SURGERY — CORONARY ANGIOGRAPHY (CATH LAB)
Anesthesia: LOCAL

## 2022-12-30 MED ORDER — SODIUM CHLORIDE 0.9 % WEIGHT BASED INFUSION: 1.0000 mL/kg/h | INTRAVENOUS | Status: DC

## 2022-12-30 MED ORDER — SODIUM CHLORIDE 0.9 % WEIGHT BASED INFUSION
3.0000 mL/kg/h | INTRAVENOUS | Status: AC
  Administered 2022-12-30: 3 mL/kg/h via INTRAVENOUS

## 2022-12-30 MED ORDER — SODIUM CHLORIDE 0.9% FLUSH: 3.0000 mL | Freq: Two times a day (BID) | INTRAVENOUS | Status: DC

## 2022-12-30 MED ORDER — MIDAZOLAM HCL 2 MG/2ML IJ SOLN
INTRAMUSCULAR | Status: DC | PRN
  Administered 2022-12-30: 2 mg via INTRAVENOUS

## 2022-12-30 MED ORDER — HYDRALAZINE HCL 20 MG/ML IJ SOLN: 10.0000 mg | INTRAMUSCULAR | Status: DC | PRN

## 2022-12-30 MED ORDER — HEPARIN (PORCINE) IN NACL 2000-0.9 UNIT/L-% IV SOLN
INTRAVENOUS | Status: DC | PRN
  Administered 2022-12-30: 1000 mL

## 2022-12-30 MED ORDER — FENTANYL CITRATE (PF) 100 MCG/2ML IJ SOLN
INTRAMUSCULAR | Status: AC
  Filled 2022-12-30: qty 2

## 2022-12-30 MED ORDER — SODIUM CHLORIDE 0.9 % IV SOLN: 250.0000 mL | INTRAVENOUS | Status: DC | PRN

## 2022-12-30 MED ORDER — ASPIRIN 81 MG PO CHEW: 81.0000 mg | CHEWABLE_TABLET | ORAL | Status: DC

## 2022-12-30 MED ORDER — MIDAZOLAM HCL 2 MG/2ML IJ SOLN
INTRAMUSCULAR | Status: AC
  Filled 2022-12-30: qty 2

## 2022-12-30 MED ORDER — VERAPAMIL HCL 2.5 MG/ML IV SOLN
INTRAVENOUS | Status: AC
  Filled 2022-12-30: qty 2

## 2022-12-30 MED ORDER — ACETAMINOPHEN 325 MG PO TABS: 650.0000 mg | ORAL_TABLET | ORAL | Status: DC | PRN

## 2022-12-30 MED ORDER — LIDOCAINE HCL (PF) 1 % IJ SOLN
INTRAMUSCULAR | Status: AC
  Filled 2022-12-30: qty 30

## 2022-12-30 MED ORDER — ONDANSETRON HCL 4 MG/2ML IJ SOLN: 4.0000 mg | Freq: Four times a day (QID) | INTRAMUSCULAR | Status: DC | PRN

## 2022-12-30 MED ORDER — HEPARIN SODIUM (PORCINE) 1000 UNIT/ML IJ SOLN
INTRAMUSCULAR | Status: DC | PRN
  Administered 2022-12-30: 6000 [IU] via INTRAVENOUS

## 2022-12-30 MED ORDER — IOHEXOL 350 MG/ML SOLN
INTRAVENOUS | Status: DC | PRN
  Administered 2022-12-30: 50 mL

## 2022-12-30 MED ORDER — SODIUM CHLORIDE 0.9% FLUSH: 3.0000 mL | INTRAVENOUS | Status: DC | PRN

## 2022-12-30 MED ORDER — VERAPAMIL HCL 2.5 MG/ML IV SOLN
INTRAVENOUS | Status: DC | PRN
  Administered 2022-12-30: 10 mL via INTRA_ARTERIAL

## 2022-12-30 MED ORDER — HEPARIN SODIUM (PORCINE) 1000 UNIT/ML IJ SOLN
INTRAMUSCULAR | Status: AC
  Filled 2022-12-30: qty 10

## 2022-12-30 MED ORDER — LABETALOL HCL 5 MG/ML IV SOLN: 10.0000 mg | INTRAVENOUS | Status: DC | PRN

## 2022-12-30 MED ORDER — FENTANYL CITRATE (PF) 100 MCG/2ML IJ SOLN
INTRAMUSCULAR | Status: DC | PRN
  Administered 2022-12-30: 25 ug via INTRAVENOUS

## 2022-12-30 MED ORDER — LIDOCAINE HCL (PF) 1 % IJ SOLN
INTRAMUSCULAR | Status: DC | PRN
  Administered 2022-12-30: 3 mL

## 2022-12-30 SURGICAL SUPPLY — 11 items
CATH INFINITI 5 FR IM (CATHETERS) ×2
CATH INFINITI 5FR AL1 (CATHETERS) ×2
CATH INFINITI 5FR JL4 (CATHETERS) ×2
CATH INFINITI JR4 5F (CATHETERS) ×2
CATHETER LAUNCHER 6FR MP1 (CATHETERS) ×2
DEVICE RAD COMP TR BAND LRG (VASCULAR PRODUCTS) ×2
ELECT DEFIB PAD ADLT CADENCE (PAD) ×2
GLIDESHEATH SLEND SS 6F .021 (SHEATH) ×2
INQWIRE 1.5J .035X260CM (WIRE) ×2
PACK CARDIAC CATHETERIZATION (CUSTOM PROCEDURE TRAY) ×2
SET ATX-X65L (MISCELLANEOUS) ×2

## 2022-12-30 NOTE — Interval H&P Note (Signed)
Cath Lab Visit (complete for each Cath Lab visit)  Clinical Evaluation Leading to the Procedure:   ACS: No.  Non-ACS:    Anginal Classification: CCS II  Anti-ischemic medical therapy: Maximal Therapy (2 or more classes of medications)  Non-Invasive Test Results: High-risk stress test findings: cardiac mortality >3%/year  Prior CABG: Previous CABG      History and Physical Interval Note:  12/30/2022 1:32 PM  Steven Lawrence  has presented today for surgery, with the diagnosis of abnormal stress test.  The various methods of treatment have been discussed with the patient and family. After consideration of risks, benefits and other options for treatment, the patient has consented to  Procedure(s): LEFT HEART CATH AND CORS/GRAFTS ANGIOGRAPHY (N/A) as a surgical intervention.  The patient's history has been reviewed, patient examined, no change in status, stable for surgery.  I have reviewed the patient's chart and labs.  Questions were answered to the patient's satisfaction.     Tonny Bollman

## 2022-12-31 ENCOUNTER — Encounter (HOSPITAL_COMMUNITY): Payer: Self-pay | Admitting: Cardiovascular Disease

## 2022-12-31 ENCOUNTER — Telehealth: Payer: Self-pay

## 2022-12-31 NOTE — Telephone Encounter (Signed)
Spoke to patient appointment scheduled with Dr.Jordan 11/7 at 10:20 am to discuss CTO.

## 2023-01-01 LAB — BASIC METABOLIC PANEL
BUN/Creatinine Ratio: 17 (ref 10–24)
BUN: 16 mg/dL (ref 8–27)
CO2: 23 mmol/L (ref 20–29)
Calcium: 9.5 mg/dL (ref 8.6–10.2)
Chloride: 104 mmol/L (ref 96–106)
Creatinine, Ser: 0.92 mg/dL (ref 0.76–1.27)
Glucose: 93 mg/dL (ref 70–99)
Potassium: 4.3 mmol/L (ref 3.5–5.2)
Sodium: 140 mmol/L (ref 134–144)
eGFR: 88 mL/min/{1.73_m2} (ref >59)

## 2023-01-01 LAB — CBC
Hematocrit: 38.8 % (ref 37.5–51.0)
Hemoglobin: 12.4 g/dL — ABNORMAL LOW (ref 13.0–17.7)
MCH: 30.1 pg (ref 26.6–33.0)
MCHC: 32 g/dL (ref 31.5–35.7)
MCV: 94 fL (ref 79–97)
Platelets: 176 10*3/uL (ref 150–450)
RBC: 4.12 x10E6/uL — ABNORMAL LOW (ref 4.14–5.80)
RDW: 14.3 % (ref 11.6–15.4)
WBC: 5.1 10*3/uL (ref 3.4–10.8)

## 2023-01-01 LAB — LIPOPROTEIN A (LPA): Lipoprotein (a): 151.4 nmol/L — ABNORMAL HIGH (ref <75.0)

## 2023-01-01 LAB — COVID-19, FLU A+B AND RSV

## 2023-01-06 ENCOUNTER — Telehealth: Payer: Self-pay | Admitting: Cardiology

## 2023-01-06 NOTE — Telephone Encounter (Signed)
Follow Up:    Patient is returning Steven Lawrence's call.

## 2023-01-08 NOTE — Telephone Encounter (Signed)
Already spoke to patient.Advised to keep appointment with Dr.Jordan 11/7 at 10:20 am to discuss CTO.

## 2023-01-12 ENCOUNTER — Ambulatory Visit: Payer: Medicare Other | Admitting: Physician Assistant

## 2023-01-13 ENCOUNTER — Ambulatory Visit: Payer: Medicare Other | Admitting: Physician Assistant

## 2023-01-13 ENCOUNTER — Ambulatory Visit: Payer: Medicare Other | Admitting: Cardiology

## 2023-01-19 NOTE — H&P (View-Only) (Signed)
Cardiology Office Note:    Date:  01/22/2023   ID:  Steven Lawrence, DOB 02-Sep-1950, MRN [id number]  PCP:  Milus Height, PA   [facility] Providers Cardiologist:  Olga Millers, MD Cardiology APP:  Marcelino Duster, PA     Referring MD: Milus Height, [place]   Chief Complaint  Patient presents with   Coronary Artery Disease   Chest Pain    History of Present Illness:    Steven Lawrence is a 72 y.o. male who is seen at the request of Dr Excell Seltzer for consideration of CTO PCI for management of his CAD. He has past medical history of CAD s/p CABG and aortic valve replacement 2021, hyperlipidemia, type 2 diabetes, OSA, tobacco use. He was hospitalized in 12/2019 for NSTEMI.  Echocardiogram 12/17/2019 showed EF 60-65%, no regional wall motion abnormalities, normal RV function, moderate aortic valve stenosis.  Left heart catheterization on 12/19/2019 showed diffusely calcified vessels, three-vessel disease, preserved LV function.  His RCA was totally occluded and he had high-grade calcified tandem AV groove lesions in the mid LAD lesion.  Recommended evaluation for CABG.  He was seen by CT surgery and ultimately underwent CABG x 5 (LIMA-LAD, SVG-D1, sequential SVG-OM2-OM3, and SVG-PDA) and aortic valve replacement with a bioprosthetic pericardial valve.  Pre-CABG Dopplers showed 1-39% bilateral ICA disease.  After surgery, patient did have atrial fibrillation that was treated with amiodarone and Eliquis.  Follow-up echocardiogram in 01/2020 showed normal EF, grade 2 DD, stable aortic valve replacement.   Patient underwent stress test on 02/2022 which revealed a large reversible perfusion defect in the lateral and basal-mid inferior LV segment.  He underwent cardiac catheterization on 04/05/2022 which showed patent LIMA-LAD, total occlusion of SVG to OM1 and OM 2, severe stenosis of the ostium of the SVG-diagonal which was treated with DES, and severe stenosis at the ostium of the SVG-PDA which was  treated with DES.   He was seen this fall with recurrent chest pain. He underwent cardiac PET scan on 12/24/2022 that showed severe reversible apical to basal inferior/lateral perfusion defect, consistent with large area of ischemia. Study was high risk and recommended cardiac catheterization. Cardiac cath was done showing occlusion of grafts to the RCA and OMs. Patent LIMA to LAD and SVG to diagonal. Now seen to consider CTO PCI of the native RCA.   On follow up today he is doing OK. He is still experiencing chest pain when he walks quickly or doing yard work. Has had angina when he gets up at night to urinate. Has used sl Ntg 3 times in the past month. Describes typical angina of central chest pain relieved with rest and Ntg. No dyspnea.   Past Medical History:  Diagnosis Date   Acid reflux    Arthritis    BPH (benign prostatic hyperplasia)    mild   CAD in native artery    a. NSTEMI 12/2019 s/p CABGx5 with bovine AVR.   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, RDI 10.2/hr ([name])   Urinary frequency    Wears glasses  Wears hearing aid    both ears    Past Surgical History:  Procedure Laterality Date   ANTERIOR LAT LUMBAR FUSION Left 01/04/2015   Procedure: ANTERIOR LATERAL LUMBAR FUSION 1 LEVEL;  Surgeon: Estill Bamberg, MD;  Location: [facility] OR;  Service: Orthopedics;  Laterality: Left;  Left sided lateral interbody fusion lumbar 3-4 with instrumentation and allograft   AORTIC VALVE REPLACEMENT N/A 12/21/2019   Procedure: AORTIC VALVE REPLACEMENT WITH INSPIRIS RESILIA AORTIC VALVE SIZE ;  Surgeon: Loreli Slot, MD;  Location: [facility]  OR;  Service: Open Heart Surgery;  Laterality: N/A;   BYPASS GRAFT ANGIOGRAPHY  12/30/2022   Procedure: BYPASS GRAFT ANGIOGRAPHY;  Surgeon: Tonny Bollman, MD;  Location: [facility] INVASIVE CV LAB;  Service: Cardiovascular;;   COLONOSCOPY     CORONARY ANGIOGRAPHY N/A 12/30/2022   Procedure: CORONARY ANGIOGRAPHY;  Surgeon: Tonny Bollman, MD;  Location: [facility] INVASIVE CV LAB;  Service: Cardiovascular;  Laterality: N/A;   CORONARY ARTERY BYPASS GRAFT N/A 12/21/2019   Procedure: CORONARY ARTERY BYPASS GRAFTING TIMES FIVE USING LEFT INTERNAL [employer] ARTERY AND ENDOSCOPICALLY HARVESTED RIGHT GREATER SAPHENOUS VEIN.;  Surgeon: Loreli Slot, MD;  Location: [facility] OR;  Service: Open Heart Surgery;  Laterality: N/A;   CORONARY STENT INTERVENTION N/A 04/11/2022   Procedure: CORONARY STENT INTERVENTION;  Surgeon: Tonny Bollman, MD;  Location: [facility] INVASIVE CV LAB;  Service: Cardiovascular;  Laterality: N/A;   ENDOVEIN HARVEST OF GREATER SAPHENOUS VEIN  12/21/2019   Procedure: ENDOVEIN HARVEST OF RIGHT GREATER SAPHENOUS VEIN;  Surgeon: Loreli Slot, MD;  Location: [facility] OR;  Service: Open Heart Surgery;;   ESOPHAGOGASTRODUODENOSCOPY     INGUINAL HERNIA REPAIR Right 08/12/2012   Procedure: HERNIA REPAIR INGUINAL ADULT;  Surgeon: Wilmon Arms. Corliss Skains, MD;  Location: [facility];  Service: General;  Laterality: Right;   INSERTION OF MESH Right 08/12/2012   Procedure: INSERTION OF MESH;  Surgeon: Wilmon Arms. Corliss Skains, MD;  Location: [facility];  Service: General;  Laterality: Right;   LEFT HEART CATH AND CORONARY ANGIOGRAPHY N/A 12/19/2019   Procedure: LEFT HEART CATH AND CORONARY ANGIOGRAPHY;  Surgeon: Runell Gess, MD;  Location: [facility] INVASIVE CV LAB;  Service: Cardiovascular;  Laterality: N/A;   LEFT HEART CATH AND CORS/GRAFTS ANGIOGRAPHY N/A 04/11/2022   Procedure: LEFT HEART CATH AND CORS/GRAFTS ANGIOGRAPHY;  Surgeon: Tonny Bollman, MD;  Location: [facility] INVASIVE CV LAB;  Service:  Cardiovascular;  Laterality: N/A;   TEE WITHOUT CARDIOVERSION N/A 12/21/2019   Procedure: TRANSESOPHAGEAL ECHOCARDIOGRAM (TEE);  Surgeon: Loreli Slot, MD;  Location: [facility] OR;  Service: Open Heart Surgery;  Laterality: N/A;   TONSILLECTOMY     VASECTOMY  1985    Current Medications: Current Meds  Medication Sig   ACCU-CHEK GUIDE test strip 1 each by Other route daily in the afternoon.   acetaminophen (TYLENOL) 650 MG CR tablet Take 1,300 mg by mouth 2 (two) times daily.   aspirin EC 81 MG EC tablet Take 1 tablet (81 mg total) by mouth daily. Swallow whole.   atorvastatin (LIPITOR) 80 MG tablet Take 80 mg by mouth daily.    BD PEN NEEDLE NANO 2ND GEN 32G X 4 MM MISC USE AS DIRECTED ONCE A DAY   canagliflozin (INVOKANA) 300 MG TABS tablet Take 300 mg by mouth daily before breakfast.   cholestyramine light 4 g POWD Take 4 g by mouth daily. 1 Scoop   clopidogrel (PLAVIX) 75 MG tablet Take 1 tablet (75 mg total) by mouth daily.   doxazosin (  CARDURA) 2 MG tablet Take 2 mg by mouth every morning. Takes for urinary freq   insulin degludec (TRESIBA) 100 UNIT/ML FlexTouch Pen Inject 20 Units into the skin at bedtime.   isosorbide mononitrate (IMDUR) 30 MG 24 hr tablet Take 1 tablet (30 mg total) by mouth daily.   levothyroxine (SYNTHROID) 125 MCG tablet Take 125 mcg by mouth every morning.   lisinopril (ZESTRIL) 5 MG tablet Take 5 mg by mouth daily.   metFORMIN (GLUCOPHAGE) 1000 MG tablet Take 1,000 mg by mouth 2 (two) times daily with a meal.   pramipexole (MIRAPEX) 0.125 MG tablet Take 0.5 mg by mouth at bedtime.   ranolazine (RANEXA) 500 MG 12 hr tablet Take 1 tablet (500 mg total) by mouth 2 (two) times daily.     Allergies:   Nsaids   Social History   Socioeconomic History   Marital status: Married    Spouse name: Not on file   Number of children: Not on file   Years of education: Not on file   Highest education level: Not on file  Occupational History   Not on file  Tobacco  Use   Smoking status: Former    Current packs/day: 0.00    Average packs/day: 1 pack/day for 53.0 years (53.0 ttl pk-yrs)    Types: Cigarettes    Start date: 12/16/1966    Quit date: 12/16/2019    Years since quitting: 3.1   Smokeless tobacco: Never  Substance and Sexual Activity   Alcohol use: Yes    Comment: occ   Drug use: No   Sexual activity: Not on file  Other Topics Concern   Not on file  Social History Narrative   Not on file   Social Determinants of Health   Financial Resource Strain: Not on file  Food Insecurity: Not on file  Transportation Needs: Not on file  Physical Activity: Not on file  Stress: Not on file  Social Connections: Not on file     Family History: The patient's family history includes Cancer in his father; Diabetes in his brother, mother, and sister; Heart disease in his brother, father, and mother; Multiple sclerosis in his daughter; Parkinson's disease in his mother.  ROS:   Please see the history of present illness.     All other systems reviewed and are negative.  EKGs/Labs/Other Studies Reviewed:    The following studies were reviewed today: Cardiac PET CT 12/24/22:   Severe reversible apical to basal inferior/lateral perfusion defect, consistent with large area of ischemia.  High risk findings including TID (1.27).  Myocardial blood flow reserve not reliable in setting of prior CABG.  Study is high risk and cardiac catheterization is recommended.   LV perfusion is abnormal. There is evidence of ischemia. Defect 1: There is a large defect with severe reduction in uptake present in the apical to basal inferior and lateral location(s) that is reversible. There is abnormal wall motion in the defect area. Consistent with ischemia.   Rest left ventricular function is abnormal. Rest EF: 44%. Stress left ventricular function is abnormal. Stress EF: 47%. End diastolic cavity size is normal. End systolic cavity size is normal.   Myocardial blood flow  reserve is not reported in this patient due to technical or patient-specific concerns that affect accuracy (prior CABG)   Coronary calcium was present on the attenuation correction CT images. Severe coronary calcifications were present. Coronary calcifications were present in the left anterior descending artery, left circumflex artery and right coronary artery  distribution(s).   Findings are consistent with ischemia. The study is high risk.   Electronically signed by Epifanio Lesches, MD   Cardiac cath 12/30/22:  CORONARY ANGIOGRAPHY  BYPASS GRAFT ANGIOGRAPHY   Conclusion      Mid LAD lesion is 80% stenosed.   Prox Cx lesion is 100% stenosed.   Origin to Prox Graft lesion before 1st Mrg  is 100% stenosed.   1st Diag lesion is 100% stenosed.   Origin lesion is 100% stenosed.   Ost RCA to Prox RCA lesion is 95% stenosed.   Mid RCA lesion is 100% stenosed.   Non-stenotic Origin to Prox Graft lesion was previously treated.   and is normal in caliber.   Seq SVG- OM1 and OM2.   The graft exhibits no disease.   1.  Severe native vessel coronary artery disease with severe LAD stenosis, total occlusion of the circumflex, and total occlusion of the RCA 2.  Status post aortocoronary bypass surgery with continued patency of the LIMA to LAD and saphenous vein graft to diagonal 3.  Chronic occlusion of the saphenous vein graft to OM branches 4.  Interval occlusion of the saphenous vein graft to PDA with proximal occlusion within the stented segment of the bypass graft 5.  Multiple sources of collaterals to the distal RCA branches 6.  Multiple sources of collaterals to the OM branches of the circumflex   Recommendations: Chances of long-term patency with repeat saphenous vein graft intervention are very low.  Favor CTO intervention of the RCA.  Discussed case with Dr. [place].  Will refer the patient for CTO intervention with Dr. [place].    Coronary Diagrams  Diagnostic Dominance:  Right  Intervention       Recent Labs: 12/29/2022: BUN 16; Creatinine, Ser 0.92; Hemoglobin 12.4; Platelets 176; Potassium 4.3; Sodium 140  Recent Lipid Panel    Component Value Date/Time   CHOL 115 09/05/2020 0943   TRIG 48 09/05/2020 0943   HDL 55 09/05/2020 0943   CHOLHDL 2.1 09/05/2020 0943   CHOLHDL 2.5 12/20/2019 0537   VLDL 9 12/20/2019 0537   LDLCALC 48 09/05/2020 0943   Dated 03/13/22: A1c 7.5% Dated 09/11/22: cholesterol 113, triglycerides 54, HDL 49, LDL 52.   Risk Assessment/Calculations:                Physical Exam:    VS:  BP 110/63 (BP Location: Left Arm, Patient Position: Sitting, Cuff Size: Normal)   Pulse 76   Ht 5\' 9"  (1.753 m)   Wt 165 lb (74.8 kg)   SpO2 96%   BMI 24.37 kg/m     Wt Readings from Last 3 Encounters:  01/22/23 165 lb (74.8 kg)  12/30/22 162 lb (73.5 kg)  12/29/22 162 lb (73.5 kg)     GEN:  Well nourished, well developed in no acute distress HEENT: Normal NECK: No JVD; No carotid bruits LYMPHATICS: No lymphadenopathy CARDIAC: RRR,  gr 2-3/6 harsh systolic murmur RUSB>> apex. no rubs, gallops RESPIRATORY:  Clear to auscultation without rales, wheezing or rhonchi  ABDOMEN: Soft, non-tender, non-distended MUSCULOSKELETAL:  No edema; No deformity  SKIN: Warm and dry NEUROLOGIC:  Alert and oriented x 3 PSYCHIATRIC:  Normal affect   ASSESSMENT:    1. Pre-procedure lab exam   2. Coronary artery disease involving native coronary artery of native heart with angina pectoris (HCC)   3. Hyperlipidemia with target LDL less than 70   4. S/P AVR (aortic valve replacement)    PLAN:  In order of problems listed above:  CAD s/p CABG 3 years ago. Now with vein graft failure including occluded SVG to OM and SVG to PDA. Patent SVG to diagonal after stent in Jan and patent LIMA to LAD. Collaterals to RCA and LCx. I personally reviewed cath films. I don't think LCx occlusion is amenable to PCI due to extreme tortuosity and  calcification. The RCA appears to be a reasonable target. It looks like there is recanalization of the vessel but it is tortuous and would require extensive stenting. I discussed with patient procedure of CTO PCI. Need for dual artery access. Overnight stay. Indication is for relief of angina. He is on optimal medical therapy. Antianginal Rx limited by low BP. The procedure and risks were reviewed including but not limited to death, myocardial infarction, stroke, perforation, arrythmias, bleeding, transfusion, emergency surgery, dye allergy, or renal dysfunction. The patient voices understanding and is agreeable to proceed. Will arrange for next Thursday Nov 14. HLD. Last LDL 52 on high dose statin S/p AVR Echo ok HTN      Informed Consent   Shared Decision Making/Informed Consent The risks [stroke (1 in 1000), death (1 in 1000), kidney failure [usually temporary] (1 in 500), bleeding (1 in 200), allergic reaction [possibly serious] (1 in 200)], benefits (diagnostic support and management of coronary artery disease) and alternatives of a cardiac catheterization were discussed in detail with Steven Lawrence and he is willing to proceed.       Medication Adjustments/Labs and Tests Ordered: Current medicines are reviewed at length with the patient today.  Concerns regarding medicines are outlined above.  Orders Placed This Encounter  Procedures   Basic metabolic panel   CBC w/Diff/Platelet   PT and PTT   No orders of the defined types were placed in this encounter.   Patient Instructions  Medication Instructions:   *If you need a refill on your cardiac medications before your next appointment, please call your pharmacy*   Lab Work:  If you have labs (blood work) drawn today and your tests are completely normal, you will receive your results only by: MyChart Message (if you have MyChart) OR A paper copy in the mail If you have any lab test that is abnormal or we need to change your  treatment, we will call you to review the results.   Testing/Procedures:    Follow-Up: At [facility], you and your health needs are our priority.  As part of our continuing mission to provide you with exceptional heart care, we have created designated Provider Care Teams.  These Care Teams include your primary Cardiologist (physician) and Advanced Practice Providers (APPs -  Physician Assistants and Nurse Practitioners) who all work together to provide you with the care you need, when you need it.  We recommend signing up for the patient portal called "MyChart".  Sign up information is provided on this After Visit Summary.  MyChart is used to connect with patients for Virtual Visits (Telemedicine).  Patients are able to view lab/test results, encounter notes, upcoming appointments, etc.  Non-urgent messages can be sent to your provider as well.   To learn more about what you can do with MyChart, go to [email or web address].    Your next appointment:      Provider:  Dr.Khaza Blansett       [facility] DEPT OF [place] AT [facility] [address] [place] [address] [place] [address] Dept: [phone] Loc: [phone]  Steven Lawrence  01/22/2023  You are scheduled for a Cardiac Catheterization on Thursday, November 14 with Dr. Clotiel Troop [place].  1. Please arrive at the [facility] (Main Entrance A) at [facility]: [address] [place], [place] [address] at 7:30 AM (This time is  hour(s) before your procedure to ensure your preparation). Free valet parking service is available. You will check in at ADMITTING. The support person will be asked to wait in the waiting room.  It is OK to have someone drop you off and come back when you are ready to be discharged.    Special note: Every effort is made to have your procedure done on time. Please understand that emergencies sometimes delay scheduled procedures.  2. Diet:  Do not eat solid foods after midnight.  The patient may have clear liquids until 5am upon the day of the procedure.  3. Labs: You will need to have blood drawn on   at . You do not need to be fasting.  4. Medication instructions in preparation for your procedure:   Contrast Allergy: Yes, Please take Prednisone 50mg  by mouth at: Thirteen hours prior to cath Seven hours prior to cath  And prior to leaving home please take last dose of Prednisone 50mg  and Benadryl 50mg  by mouth.     Current Outpatient Medications (Endocrine & Metabolic):    canagliflozin (INVOKANA) 300 MG TABS tablet, Take 300 mg by mouth daily before breakfast.   insulin degludec (TRESIBA) 100 UNIT/ML FlexTouch Pen, Inject 20 Units into the skin at bedtime.   levothyroxine (SYNTHROID) 125 MCG tablet, Take 125 mcg by mouth every morning.   metFORMIN (GLUCOPHAGE) 1000 MG tablet, Take 1,000 mg by mouth 2 (two) times daily with a meal.  Current Outpatient Medications (Cardiovascular):    atorvastatin (LIPITOR) 80 MG tablet, Take 80 mg by mouth daily.    cholestyramine light 4 g POWD, Take 4 g by mouth daily. 1 Scoop   doxazosin (CARDURA) 2 MG tablet, Take 2 mg by mouth every morning. Takes for urinary freq   isosorbide mononitrate (IMDUR) 30 MG 24 hr tablet, Take 1 tablet (30 mg total) by mouth daily.   lisinopril (ZESTRIL) 5 MG tablet, Take 5 mg by mouth daily.   ranolazine (RANEXA) 500 MG 12 hr tablet, Take 1 tablet (500 mg total) by mouth 2 (two) times daily.   nitroGLYCERIN (NITROSTAT) 0.4 MG SL tablet, Place 1 tablet (0.4 mg total) under the tongue every 5 (five) minutes as needed for chest pain.   Current Outpatient Medications (Analgesics):    acetaminophen (TYLENOL) 650 MG CR tablet, Take 1,300 mg by mouth 2 (two) times daily.   aspirin EC 81 MG EC tablet, Take 1 tablet (81 mg total) by mouth daily. Swallow whole.  Current Outpatient Medications (Hematological):    clopidogrel (PLAVIX) 75 MG tablet, Take 1 tablet  (75 mg total) by mouth daily.  Current Outpatient Medications (Other):    ACCU-CHEK GUIDE test strip, 1 each by Other route daily in the afternoon.   BD PEN NEEDLE NANO 2ND GEN 32G X 4 MM MISC, USE AS DIRECTED ONCE A DAY   pramipexole (MIRAPEX) 0.125 MG tablet, Take 0.5 mg by mouth at bedtime.   Blood Glucose Monitoring Suppl (ACCU-CHEK AVIVA PLUS) w/Device KIT, daily in the afternoon. *For reference purposes while preparing patient instructions.   Delete this med list prior to printing instructions for patient.*  5. Plan to go home the same day, you will only stay overnight  if medically necessary. 6. Bring a current list of your medications and current insurance cards. 7. You MUST have a responsible person to drive you home. 8. Someone MUST be with you the first 24 hours after you arrive home or your discharge will be delayed. 9. Please wear clothes that are easy to get on and off and wear slip-on shoes.  Thank you for allowing [place] to care for you!   -- [facility] Invasive Cardiovascular services     Signed, Rielly Brunn [place], MD  01/22/2023 10:41 AM    [facility]

## 2023-01-19 NOTE — Progress Notes (Signed)
Cardiology Office Note:    Date:  01/22/2023   ID:  Steven Lawrence, DOB 02-Sep-1950, MRN [id number]  PCP:  Milus Height, PA   [facility] Providers Cardiologist:  Olga Millers, MD Cardiology APP:  Marcelino Duster, PA     Referring MD: Milus Height, [place]   Chief Complaint  Patient presents with   Coronary Artery Disease   Chest Pain    History of Present Illness:    Steven Lawrence is a 72 y.o. male who is seen at the request of Dr Excell Seltzer for consideration of CTO PCI for management of his CAD. He has past medical history of CAD s/p CABG and aortic valve replacement 2021, hyperlipidemia, type 2 diabetes, OSA, tobacco use. He was hospitalized in 12/2019 for NSTEMI.  Echocardiogram 12/17/2019 showed EF 60-65%, no regional wall motion abnormalities, normal RV function, moderate aortic valve stenosis.  Left heart catheterization on 12/19/2019 showed diffusely calcified vessels, three-vessel disease, preserved LV function.  His RCA was totally occluded and he had high-grade calcified tandem AV groove lesions in the mid LAD lesion.  Recommended evaluation for CABG.  He was seen by CT surgery and ultimately underwent CABG x 5 (LIMA-LAD, SVG-D1, sequential SVG-OM2-OM3, and SVG-PDA) and aortic valve replacement with a bioprosthetic pericardial valve.  Pre-CABG Dopplers showed 1-39% bilateral ICA disease.  After surgery, patient did have atrial fibrillation that was treated with amiodarone and Eliquis.  Follow-up echocardiogram in 01/2020 showed normal EF, grade 2 DD, stable aortic valve replacement.   Patient underwent stress test on 02/2022 which revealed a large reversible perfusion defect in the lateral and basal-mid inferior LV segment.  He underwent cardiac catheterization on 04/05/2022 which showed patent LIMA-LAD, total occlusion of SVG to OM1 and OM 2, severe stenosis of the ostium of the SVG-diagonal which was treated with DES, and severe stenosis at the ostium of the SVG-PDA which was  treated with DES.   He was seen this fall with recurrent chest pain. He underwent cardiac PET scan on 12/24/2022 that showed severe reversible apical to basal inferior/lateral perfusion defect, consistent with large area of ischemia. Study was high risk and recommended cardiac catheterization. Cardiac cath was done showing occlusion of grafts to the RCA and OMs. Patent LIMA to LAD and SVG to diagonal. Now seen to consider CTO PCI of the native RCA.   On follow up today he is doing OK. He is still experiencing chest pain when he walks quickly or doing yard work. Has had angina when he gets up at night to urinate. Has used sl Ntg 3 times in the past month. Describes typical angina of central chest pain relieved with rest and Ntg. No dyspnea.   Past Medical History:  Diagnosis Date   Acid reflux    Arthritis    BPH (benign prostatic hyperplasia)    mild   CAD in native artery    a. NSTEMI 12/2019 s/p CABGx5 with bovine AVR.   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, RDI 10.2/hr ([name])   Urinary frequency    Wears glasses  Wears hearing aid    both ears    Past Surgical History:  Procedure Laterality Date   ANTERIOR LAT LUMBAR FUSION Left 01/04/2015   Procedure: ANTERIOR LATERAL LUMBAR FUSION 1 LEVEL;  Surgeon: Estill Bamberg, MD;  Location: [facility] OR;  Service: Orthopedics;  Laterality: Left;  Left sided lateral interbody fusion lumbar 3-4 with instrumentation and allograft   AORTIC VALVE REPLACEMENT N/A 12/21/2019   Procedure: AORTIC VALVE REPLACEMENT WITH INSPIRIS RESILIA AORTIC VALVE SIZE ;  Surgeon: Loreli Slot, MD;  Location: [facility]  OR;  Service: Open Heart Surgery;  Laterality: N/A;   BYPASS GRAFT ANGIOGRAPHY  12/30/2022   Procedure: BYPASS GRAFT ANGIOGRAPHY;  Surgeon: Tonny Bollman, MD;  Location: [facility] INVASIVE CV LAB;  Service: Cardiovascular;;   COLONOSCOPY     CORONARY ANGIOGRAPHY N/A 12/30/2022   Procedure: CORONARY ANGIOGRAPHY;  Surgeon: Tonny Bollman, MD;  Location: [facility] INVASIVE CV LAB;  Service: Cardiovascular;  Laterality: N/A;   CORONARY ARTERY BYPASS GRAFT N/A 12/21/2019   Procedure: CORONARY ARTERY BYPASS GRAFTING TIMES FIVE USING LEFT INTERNAL [employer] ARTERY AND ENDOSCOPICALLY HARVESTED RIGHT GREATER SAPHENOUS VEIN.;  Surgeon: Loreli Slot, MD;  Location: [facility] OR;  Service: Open Heart Surgery;  Laterality: N/A;   CORONARY STENT INTERVENTION N/A 04/11/2022   Procedure: CORONARY STENT INTERVENTION;  Surgeon: Tonny Bollman, MD;  Location: [facility] INVASIVE CV LAB;  Service: Cardiovascular;  Laterality: N/A;   ENDOVEIN HARVEST OF GREATER SAPHENOUS VEIN  12/21/2019   Procedure: ENDOVEIN HARVEST OF RIGHT GREATER SAPHENOUS VEIN;  Surgeon: Loreli Slot, MD;  Location: [facility] OR;  Service: Open Heart Surgery;;   ESOPHAGOGASTRODUODENOSCOPY     INGUINAL HERNIA REPAIR Right 08/12/2012   Procedure: HERNIA REPAIR INGUINAL ADULT;  Surgeon: Wilmon Arms. Corliss Skains, MD;  Location: [facility];  Service: General;  Laterality: Right;   INSERTION OF MESH Right 08/12/2012   Procedure: INSERTION OF MESH;  Surgeon: Wilmon Arms. Corliss Skains, MD;  Location: [facility];  Service: General;  Laterality: Right;   LEFT HEART CATH AND CORONARY ANGIOGRAPHY N/A 12/19/2019   Procedure: LEFT HEART CATH AND CORONARY ANGIOGRAPHY;  Surgeon: Runell Gess, MD;  Location: [facility] INVASIVE CV LAB;  Service: Cardiovascular;  Laterality: N/A;   LEFT HEART CATH AND CORS/GRAFTS ANGIOGRAPHY N/A 04/11/2022   Procedure: LEFT HEART CATH AND CORS/GRAFTS ANGIOGRAPHY;  Surgeon: Tonny Bollman, MD;  Location: [facility] INVASIVE CV LAB;  Service:  Cardiovascular;  Laterality: N/A;   TEE WITHOUT CARDIOVERSION N/A 12/21/2019   Procedure: TRANSESOPHAGEAL ECHOCARDIOGRAM (TEE);  Surgeon: Loreli Slot, MD;  Location: [facility] OR;  Service: Open Heart Surgery;  Laterality: N/A;   TONSILLECTOMY     VASECTOMY  1985    Current Medications: Current Meds  Medication Sig   ACCU-CHEK GUIDE test strip 1 each by Other route daily in the afternoon.   acetaminophen (TYLENOL) 650 MG CR tablet Take 1,300 mg by mouth 2 (two) times daily.   aspirin EC 81 MG EC tablet Take 1 tablet (81 mg total) by mouth daily. Swallow whole.   atorvastatin (LIPITOR) 80 MG tablet Take 80 mg by mouth daily.    BD PEN NEEDLE NANO 2ND GEN 32G X 4 MM MISC USE AS DIRECTED ONCE A DAY   canagliflozin (INVOKANA) 300 MG TABS tablet Take 300 mg by mouth daily before breakfast.   cholestyramine light 4 g POWD Take 4 g by mouth daily. 1 Scoop   clopidogrel (PLAVIX) 75 MG tablet Take 1 tablet (75 mg total) by mouth daily.   doxazosin (  CARDURA) 2 MG tablet Take 2 mg by mouth every morning. Takes for urinary freq   insulin degludec (TRESIBA) 100 UNIT/ML FlexTouch Pen Inject 20 Units into the skin at bedtime.   isosorbide mononitrate (IMDUR) 30 MG 24 hr tablet Take 1 tablet (30 mg total) by mouth daily.   levothyroxine (SYNTHROID) 125 MCG tablet Take 125 mcg by mouth every morning.   lisinopril (ZESTRIL) 5 MG tablet Take 5 mg by mouth daily.   metFORMIN (GLUCOPHAGE) 1000 MG tablet Take 1,000 mg by mouth 2 (two) times daily with a meal.   pramipexole (MIRAPEX) 0.125 MG tablet Take 0.5 mg by mouth at bedtime.   ranolazine (RANEXA) 500 MG 12 hr tablet Take 1 tablet (500 mg total) by mouth 2 (two) times daily.     Allergies:   Nsaids   Social History   Socioeconomic History   Marital status: Married    Spouse name: Not on file   Number of children: Not on file   Years of education: Not on file   Highest education level: Not on file  Occupational History   Not on file  Tobacco  Use   Smoking status: Former    Current packs/day: 0.00    Average packs/day: 1 pack/day for 53.0 years (53.0 ttl pk-yrs)    Types: Cigarettes    Start date: 12/16/1966    Quit date: 12/16/2019    Years since quitting: 3.1   Smokeless tobacco: Never  Substance and Sexual Activity   Alcohol use: Yes    Comment: occ   Drug use: No   Sexual activity: Not on file  Other Topics Concern   Not on file  Social History Narrative   Not on file   Social Determinants of Health   Financial Resource Strain: Not on file  Food Insecurity: Not on file  Transportation Needs: Not on file  Physical Activity: Not on file  Stress: Not on file  Social Connections: Not on file     Family History: The patient's family history includes Cancer in his father; Diabetes in his brother, mother, and sister; Heart disease in his brother, father, and mother; Multiple sclerosis in his daughter; Parkinson's disease in his mother.  ROS:   Please see the history of present illness.     All other systems reviewed and are negative.  EKGs/Labs/Other Studies Reviewed:    The following studies were reviewed today: Cardiac PET CT 12/24/22:   Severe reversible apical to basal inferior/lateral perfusion defect, consistent with large area of ischemia.  High risk findings including TID (1.27).  Myocardial blood flow reserve not reliable in setting of prior CABG.  Study is high risk and cardiac catheterization is recommended.   LV perfusion is abnormal. There is evidence of ischemia. Defect 1: There is a large defect with severe reduction in uptake present in the apical to basal inferior and lateral location(s) that is reversible. There is abnormal wall motion in the defect area. Consistent with ischemia.   Rest left ventricular function is abnormal. Rest EF: 44%. Stress left ventricular function is abnormal. Stress EF: 47%. End diastolic cavity size is normal. End systolic cavity size is normal.   Myocardial blood flow  reserve is not reported in this patient due to technical or patient-specific concerns that affect accuracy (prior CABG)   Coronary calcium was present on the attenuation correction CT images. Severe coronary calcifications were present. Coronary calcifications were present in the left anterior descending artery, left circumflex artery and right coronary artery  distribution(s).   Findings are consistent with ischemia. The study is high risk.   Electronically signed by Epifanio Lesches, MD   Cardiac cath 12/30/22:  CORONARY ANGIOGRAPHY  BYPASS GRAFT ANGIOGRAPHY   Conclusion      Mid LAD lesion is 80% stenosed.   Prox Cx lesion is 100% stenosed.   Origin to Prox Graft lesion before 1st Mrg  is 100% stenosed.   1st Diag lesion is 100% stenosed.   Origin lesion is 100% stenosed.   Ost RCA to Prox RCA lesion is 95% stenosed.   Mid RCA lesion is 100% stenosed.   Non-stenotic Origin to Prox Graft lesion was previously treated.   and is normal in caliber.   Seq SVG- OM1 and OM2.   The graft exhibits no disease.   1.  Severe native vessel coronary artery disease with severe LAD stenosis, total occlusion of the circumflex, and total occlusion of the RCA 2.  Status post aortocoronary bypass surgery with continued patency of the LIMA to LAD and saphenous vein graft to diagonal 3.  Chronic occlusion of the saphenous vein graft to OM branches 4.  Interval occlusion of the saphenous vein graft to PDA with proximal occlusion within the stented segment of the bypass graft 5.  Multiple sources of collaterals to the distal RCA branches 6.  Multiple sources of collaterals to the OM branches of the circumflex   Recommendations: Chances of long-term patency with repeat saphenous vein graft intervention are very low.  Favor CTO intervention of the RCA.  Discussed case with Dr. [place].  Will refer the patient for CTO intervention with Dr. [place]and.    Coronary Diagrams  Diagnostic Dominance:  Right  Intervention       Recent Labs: 12/29/2022: BUN 16; Creatinine, Ser 0.92; Hemoglobin 12.4; Platelets 176; Potassium 4.3; Sodium 140  Recent Lipid Panel    Component Value Date/Time   CHOL 115 09/05/2020 0943   TRIG 48 09/05/2020 0943   HDL 55 09/05/2020 0943   CHOLHDL 2.1 09/05/2020 0943   CHOLHDL 2.5 12/20/2019 0537   VLDL 9 12/20/2019 0537   LDLCALC 48 09/05/2020 0943   Dated 03/13/22: A1c 7.5% Dated 09/11/22: cholesterol 113, triglycerides 54, HDL 49, LDL 52.   Risk Assessment/Calculations:                Physical Exam:    VS:  BP 110/63 (BP Location: Left Arm, Patient Position: Sitting, Cuff Size: Normal)   Pulse 76   Ht 5\' 9"  (1.753 m)   Wt 165 lb (74.8 kg)   SpO2 96%   BMI 24.37 kg/m     Wt Readings from Last 3 Encounters:  01/22/23 165 lb (74.8 kg)  12/30/22 162 lb (73.5 kg)  12/29/22 162 lb (73.5 kg)     GEN:  Well nourished, well developed in no acute distress HEENT: Normal NECK: No JVD; No carotid bruits LYMPHATICS: No lymphadenopathy CARDIAC: RRR,  gr 2-3/6 harsh systolic murmur RUSB>> apex. no rubs, gallops RESPIRATORY:  Clear to auscultation without rales, wheezing or rhonchi  ABDOMEN: Soft, non-tender, non-distended MUSCULOSKELETAL:  No edema; No deformity  SKIN: Warm and dry NEUROLOGIC:  Alert and oriented x 3 PSYCHIATRIC:  Normal affect   ASSESSMENT:    1. Pre-procedure lab exam   2. Coronary artery disease involving native coronary artery of native heart with angina pectoris (HCC)   3. Hyperlipidemia with target LDL less than 70   4. S/P AVR (aortic valve replacement)    PLAN:  In order of problems listed above:  CAD s/p CABG 3 years ago. Now with vein graft failure including occluded SVG to OM and SVG to PDA. Patent SVG to diagonal after stent in Jan and patent LIMA to LAD. Collaterals to RCA and LCx. I personally reviewed cath films. I don't think LCx occlusion is amenable to PCI due to extreme tortuosity and  calcification. The RCA appears to be a reasonable target. It looks like there is recanalization of the vessel but it is tortuous and would require extensive stenting. I discussed with patient procedure of CTO PCI. Need for dual artery access. Overnight stay. Indication is for relief of angina. He is on optimal medical therapy. Antianginal Rx limited by low BP. The procedure and risks were reviewed including but not limited to death, myocardial infarction, stroke, perforation, arrythmias, bleeding, transfusion, emergency surgery, dye allergy, or renal dysfunction. The patient voices understanding and is agreeable to proceed. Will arrange for next Thursday Nov 14. HLD. Last LDL 52 on high dose statin S/p AVR Echo ok HTN      Informed Consent   Shared Decision Making/Informed Consent The risks [stroke (1 in 1000), death (1 in 1000), kidney failure [usually temporary] (1 in 500), bleeding (1 in 200), allergic reaction [possibly serious] (1 in 200)], benefits (diagnostic support and management of coronary artery disease) and alternatives of a cardiac catheterization were discussed in detail with Mr. Yamashiro and he is willing to proceed.       Medication Adjustments/Labs and Tests Ordered: Current medicines are reviewed at length with the patient today.  Concerns regarding medicines are outlined above.  Orders Placed This Encounter  Procedures   Basic metabolic panel   CBC w/Diff/Platelet   PT and PTT   No orders of the defined types were placed in this encounter.   Patient Instructions  Medication Instructions:   *If you need a refill on your cardiac medications before your next appointment, please call your pharmacy*   Lab Work:  If you have labs (blood work) drawn today and your tests are completely normal, you will receive your results only by: MyChart Message (if you have MyChart) OR A paper copy in the mail If you have any lab test that is abnormal or we need to change your  treatment, we will call you to review the results.   Testing/Procedures:    Follow-Up: At [facility], you and your health needs are our priority.  As part of our continuing mission to provide you with exceptional heart care, we have created designated Provider Care Teams.  These Care Teams include your primary Cardiologist (physician) and Advanced Practice Providers (APPs -  Physician Assistants and Nurse Practitioners) who all work together to provide you with the care you need, when you need it.  We recommend signing up for the patient portal called "MyChart".  Sign up information is provided on this After Visit Summary.  MyChart is used to connect with patients for Virtual Visits (Telemedicine).  Patients are able to view lab/test results, encounter notes, upcoming appointments, etc.  Non-urgent messages can be sent to your provider as well.   To learn more about what you can do with MyChart, go to [email or web address].    Your next appointment:      Provider:  Dr.Khaza Blansett       [facility] DEPT OF [place] AT [facility] [address] [place] [address] [place] [address] Dept: [phone] Loc: [phone]  Marqez D Horn  01/22/2023  You are scheduled for a Cardiac Catheterization on Thursday, November 14 with Dr. Clotiel Troop [place].  1. Please arrive at the [facility] (Main Entrance A) at [facility]: [address] [place], [place] [address] at 7:30 AM (This time is  hour(s) before your procedure to ensure your preparation). Free valet parking service is available. You will check in at ADMITTING. The support person will be asked to wait in the waiting room.  It is OK to have someone drop you off and come back when you are ready to be discharged.    Special note: Every effort is made to have your procedure done on time. Please understand that emergencies sometimes delay scheduled procedures.  2. Diet:  Do not eat solid foods after midnight.  The patient may have clear liquids until 5am upon the day of the procedure.  3. Labs: You will need to have blood drawn on   at . You do not need to be fasting.  4. Medication instructions in preparation for your procedure:   Contrast Allergy: Yes, Please take Prednisone 50mg  by mouth at: Thirteen hours prior to cath Seven hours prior to cath  And prior to leaving home please take last dose of Prednisone 50mg  and Benadryl 50mg  by mouth.     Current Outpatient Medications (Endocrine & Metabolic):    canagliflozin (INVOKANA) 300 MG TABS tablet, Take 300 mg by mouth daily before breakfast.   insulin degludec (TRESIBA) 100 UNIT/ML FlexTouch Pen, Inject 20 Units into the skin at bedtime.   levothyroxine (SYNTHROID) 125 MCG tablet, Take 125 mcg by mouth every morning.   metFORMIN (GLUCOPHAGE) 1000 MG tablet, Take 1,000 mg by mouth 2 (two) times daily with a meal.  Current Outpatient Medications (Cardiovascular):    atorvastatin (LIPITOR) 80 MG tablet, Take 80 mg by mouth daily.    cholestyramine light 4 g POWD, Take 4 g by mouth daily. 1 Scoop   doxazosin (CARDURA) 2 MG tablet, Take 2 mg by mouth every morning. Takes for urinary freq   isosorbide mononitrate (IMDUR) 30 MG 24 hr tablet, Take 1 tablet (30 mg total) by mouth daily.   lisinopril (ZESTRIL) 5 MG tablet, Take 5 mg by mouth daily.   ranolazine (RANEXA) 500 MG 12 hr tablet, Take 1 tablet (500 mg total) by mouth 2 (two) times daily.   nitroGLYCERIN (NITROSTAT) 0.4 MG SL tablet, Place 1 tablet (0.4 mg total) under the tongue every 5 (five) minutes as needed for chest pain.   Current Outpatient Medications (Analgesics):    acetaminophen (TYLENOL) 650 MG CR tablet, Take 1,300 mg by mouth 2 (two) times daily.   aspirin EC 81 MG EC tablet, Take 1 tablet (81 mg total) by mouth daily. Swallow whole.  Current Outpatient Medications (Hematological):    clopidogrel (PLAVIX) 75 MG tablet, Take 1 tablet  (75 mg total) by mouth daily.  Current Outpatient Medications (Other):    ACCU-CHEK GUIDE test strip, 1 each by Other route daily in the afternoon.   BD PEN NEEDLE NANO 2ND GEN 32G X 4 MM MISC, USE AS DIRECTED ONCE A DAY   pramipexole (MIRAPEX) 0.125 MG tablet, Take 0.5 mg by mouth at bedtime.   Blood Glucose Monitoring Suppl (ACCU-CHEK AVIVA PLUS) w/Device KIT, daily in the afternoon. *For reference purposes while preparing patient instructions.   Delete this med list prior to printing instructions for patient.*  5. Plan to go home the same day, you will only stay overnight  if medically necessary. 6. Bring a current list of your medications and current insurance cards. 7. You MUST have a responsible person to drive you home. 8. Someone MUST be with you the first 24 hours after you arrive home or your discharge will be delayed. 9. Please wear clothes that are easy to get on and off and wear slip-on shoes.  Thank you for allowing [place] to care for you!   -- [facility] Invasive Cardiovascular services     Signed, Rielly Brunn [place], MD  01/22/2023 10:41 AM    [facility]

## 2023-01-20 ENCOUNTER — Ambulatory Visit (HOSPITAL_COMMUNITY): Payer: Medicare Other | Attending: Cardiology

## 2023-01-20 DIAGNOSIS — Z951 Presence of aortocoronary bypass graft: Secondary | ICD-10-CM | POA: Diagnosis not present

## 2023-01-20 DIAGNOSIS — Z952 Presence of prosthetic heart valve: Secondary | ICD-10-CM | POA: Diagnosis not present

## 2023-01-20 LAB — ECHOCARDIOGRAM COMPLETE
AR max vel: 0.97 cm2
AV Area VTI: 0.95 cm2
AV Area mean vel: 0.89 cm2
AV Mean grad: 16.3 mm[Hg]
AV Peak grad: 28.8 mm[Hg]
Ao pk vel: 2.68 m/s
Area-P 1/2: 4.47 cm2
S' Lateral: 2.9 cm

## 2023-01-21 ENCOUNTER — Telehealth: Payer: Self-pay

## 2023-01-21 NOTE — Telephone Encounter (Signed)
Called patient advised of below they verbalized understanding.

## 2023-01-21 NOTE — Telephone Encounter (Signed)
-----   Message from Jonita Albee sent at 01/20/2023 12:28 PM EST ----- Please tell patient that their echocardiogram showed normal EF (pumping function of the heart) at 65-70%. There were no regional wall motion abnormalities, and heart relaxes normally (normal diastolic parameters). RV functioning normally. Mitral valve is a little leaky, unlikely to cause symptoms when mild, and this is something we will keep an eye on in the future. Aortic valve prosthesis functioning normally. Overall, good result!  Thanks [employer]

## 2023-01-22 ENCOUNTER — Ambulatory Visit: Payer: Medicare Other | Attending: Cardiology | Admitting: Cardiology

## 2023-01-22 ENCOUNTER — Other Ambulatory Visit: Payer: Self-pay | Admitting: Cardiology

## 2023-01-22 ENCOUNTER — Encounter: Payer: Self-pay | Admitting: Cardiology

## 2023-01-22 VITALS — BP 110/63 | HR 76 | Ht 69.0 in | Wt 165.0 lb

## 2023-01-22 DIAGNOSIS — Z952 Presence of prosthetic heart valve: Secondary | ICD-10-CM

## 2023-01-22 DIAGNOSIS — I25119 Atherosclerotic heart disease of native coronary artery with unspecified angina pectoris: Secondary | ICD-10-CM | POA: Diagnosis not present

## 2023-01-22 DIAGNOSIS — E785 Hyperlipidemia, unspecified: Secondary | ICD-10-CM | POA: Diagnosis not present

## 2023-01-22 DIAGNOSIS — I25708 Atherosclerosis of coronary artery bypass graft(s), unspecified, with other forms of angina pectoris: Secondary | ICD-10-CM

## 2023-01-22 DIAGNOSIS — Z01812 Encounter for preprocedural laboratory examination: Secondary | ICD-10-CM | POA: Diagnosis not present

## 2023-01-22 NOTE — Patient Instructions (Signed)
Medication Instructions:  Continue all medications *If you need a refill on your cardiac medications before your next appointment, please call your pharmacy*   Lab Work: Bmet,cbc,pt today   Testing/Procedures: Cardiac Cath CTO scheduled at [facility] hospital Thurs 11/14 arrive at 5:30 am  Follow instructions below   Follow-Up: At [facility], you and your health needs are our priority.  As part of our continuing mission to provide you with exceptional heart care, we have created designated Provider Care Teams.  These Care Teams include your primary Cardiologist (physician) and Advanced Practice Providers (APPs -  Physician Assistants and Nurse Practitioners) who all work together to provide you with the care you need, when you need it.  We recommend signing up for the patient portal called "MyChart".  Sign up information is provided on this After Visit Summary.  MyChart is used to connect with patients for Virtual Visits (Telemedicine).  Patients are able to view lab/test results, encounter notes, upcoming appointments, etc.  Non-urgent messages can be sent to your provider as well.   To learn more about what you can do with MyChart, go to [email or web address].     Your next appointment:  Friday 12/20 at 9:40 am    Provider:  Dr.Jordan       [facility] A DEPT OF [place]. [facility] AT [facility] [address] [place] [address] Dept: [phone] Loc: [phone]  Steven Lawrence  01/22/2023  You are scheduled for a Cardiac Catheterization on Thursday, November 14 with Dr. Peter Swaziland.  1. Please arrive at the [facility] (Main Entrance A) at [facility]: [address] [place], [place] [address] at 5:30 AM (This time is 2 hour(s) before your procedure to ensure your preparation). Free valet parking service is available. You will check in at ADMITTING. The support person will be asked to wait in  the waiting room.  It is OK to have someone drop you off and come back when you are ready to be discharged.    Special note: Every effort is made to have your procedure done on time. Please understand that emergencies sometimes delay scheduled procedures.  2. Diet: Do not eat solid foods after midnight.  The patient may have clear liquids until 5am upon the day of the procedure.  3. Labs: You will need to have blood drawn on Thurs 11/7 at Dr.Jordan's office.  4. Medication instructions in preparation for your procedure:     Hold Metformin morning of cath and Hold 2 days after cath. Hold Invokana 3 days before cath Take 1/2 dose of Insulin night before cath    On the morning of your procedure, take your Aspirin 81 mg and Plavix/Clopidogrel and any morning medicines NOT listed above.  You may use sips of water.  5. Plan to go home the same day, you will only stay overnight if medically necessary. 6. Bring a current list of your medications and current insurance cards. 7. You MUST have a responsible person to drive you home. 8. Someone MUST be with you the first 24 hours after you arrive home or your discharge will be delayed. 9. Please wear clothes that are easy to get on and off and wear slip-on shoes.  Thank you for allowing [place] to care for you!   -- [place] [facility] [facility]

## 2023-01-23 LAB — CBC WITH DIFFERENTIAL/PLATELET
Basophils Absolute: 0 10*3/uL (ref 0.0–0.2)
Basos: 1 %
EOS (ABSOLUTE): 0.1 10*3/uL (ref 0.0–0.4)
Eos: 2 %
Hematocrit: 42.2 % (ref 37.5–51.0)
Hemoglobin: 13.7 g/dL (ref 13.0–17.7)
Immature Grans (Abs): 0 10*3/uL (ref 0.0–0.1)
Immature Granulocytes: 0 %
Lymphocytes Absolute: 0.8 10*3/uL (ref 0.7–3.1)
Lymphs: 17 %
MCH: 30.6 pg (ref 26.6–33.0)
MCHC: 32.5 g/dL (ref 31.5–35.7)
MCV: 94 fL (ref 79–97)
Monocytes Absolute: 0.7 10*3/uL (ref 0.1–0.9)
Monocytes: 15 %
Neutrophils Absolute: 3 10*3/uL (ref 1.4–7.0)
Neutrophils: 65 %
Platelets: 185 10*3/uL (ref 150–450)
RBC: 4.47 x10E6/uL (ref 4.14–5.80)
RDW: 14.6 % (ref 11.6–15.4)
WBC: 4.5 10*3/uL (ref 3.4–10.8)

## 2023-01-23 LAB — PT AND PTT
INR: 1 (ref 0.9–1.2)
Prothrombin Time: 11 s (ref 9.1–12.0)
aPTT: 29 s (ref 24–33)

## 2023-01-23 LAB — BASIC METABOLIC PANEL
BUN/Creatinine Ratio: 20 (ref 10–24)
BUN: 18 mg/dL (ref 8–27)
CO2: 23 mmol/L (ref 20–29)
Calcium: 9.3 mg/dL (ref 8.6–10.2)
Chloride: 101 mmol/L (ref 96–106)
Creatinine, Ser: 0.91 mg/dL (ref 0.76–1.27)
Glucose: 98 mg/dL (ref 70–99)
Potassium: 5 mmol/L (ref 3.5–5.2)
Sodium: 137 mmol/L (ref 134–144)
eGFR: 90 mL/min/{1.73_m2} (ref >59)

## 2023-01-26 ENCOUNTER — Telehealth: Payer: Self-pay | Admitting: Cardiology

## 2023-01-26 NOTE — Telephone Encounter (Signed)
Patient wants a call back directly from RN Cheryl. 

## 2023-01-28 ENCOUNTER — Telehealth: Payer: Self-pay | Admitting: *Deleted

## 2023-01-28 NOTE — Telephone Encounter (Signed)
Cardiac Catheterization scheduled at [facility] for: Thursday January 29, 2023 7:30 AM Arrival time [facility] Main Entrance A at: 5:30 AM  Nothing to eat after midnight prior to procedure, clear liquids until 5 AM day of procedure.  Medication instructions: -Hold:  Metformin-day of procedure and 48 hours post procedure  Invokana-AM of procedure  Tresiba-1/2 usual dose HS prior to procedure  Patient reports he does not take Insulin in the AM. -Other usual  morning medications can be taken with sips of water including aspirin 81 mg and Plavix 75 mg.   Plan to go home the same day, you will only stay overnight if medically necessary.  You must have responsible adult to drive you home.  Someone must be with you the first 24 hours after you arrive home.  Reviewed procedure instructions with patient.

## 2023-01-29 ENCOUNTER — Ambulatory Visit (HOSPITAL_COMMUNITY)
Admission: RE | Disposition: A | Discharge status: Home / Self Care | Payer: Self-pay | Source: Home / Self Care | Attending: Cardiology

## 2023-01-29 ENCOUNTER — Other Ambulatory Visit: Payer: Self-pay

## 2023-01-29 ENCOUNTER — Ambulatory Visit (HOSPITAL_COMMUNITY)
Admission: RE | Admit: 2023-01-29 | Discharge: 2023-01-30 | Disposition: A | Discharge status: Home / Self Care | Payer: Medicare Other | Attending: Cardiology | Admitting: Cardiology

## 2023-01-29 DIAGNOSIS — I10 Essential (primary) hypertension: Secondary | ICD-10-CM | POA: Diagnosis not present

## 2023-01-29 DIAGNOSIS — Z953 Presence of xenogenic heart valve: Secondary | ICD-10-CM | POA: Diagnosis not present

## 2023-01-29 DIAGNOSIS — Z7901 Long term (current) use of anticoagulants: Secondary | ICD-10-CM | POA: Diagnosis not present

## 2023-01-29 DIAGNOSIS — I25709 Atherosclerosis of coronary artery bypass graft(s), unspecified, with unspecified angina pectoris: Secondary | ICD-10-CM | POA: Diagnosis not present

## 2023-01-29 DIAGNOSIS — I2582 Chronic total occlusion of coronary artery: Secondary | ICD-10-CM | POA: Insufficient documentation

## 2023-01-29 DIAGNOSIS — Z87891 Personal history of nicotine dependence: Secondary | ICD-10-CM | POA: Insufficient documentation

## 2023-01-29 DIAGNOSIS — I209 Angina pectoris, unspecified: Secondary | ICD-10-CM | POA: Diagnosis present

## 2023-01-29 DIAGNOSIS — Z79890 Hormone replacement therapy (postmenopausal): Secondary | ICD-10-CM | POA: Diagnosis not present

## 2023-01-29 DIAGNOSIS — Z794 Long term (current) use of insulin: Secondary | ICD-10-CM | POA: Insufficient documentation

## 2023-01-29 DIAGNOSIS — I4891 Unspecified atrial fibrillation: Secondary | ICD-10-CM | POA: Diagnosis not present

## 2023-01-29 DIAGNOSIS — G4733 Obstructive sleep apnea (adult) (pediatric): Secondary | ICD-10-CM | POA: Diagnosis not present

## 2023-01-29 DIAGNOSIS — Z7902 Long term (current) use of antithrombotics/antiplatelets: Secondary | ICD-10-CM | POA: Insufficient documentation

## 2023-01-29 DIAGNOSIS — Z79899 Other long term (current) drug therapy: Secondary | ICD-10-CM | POA: Insufficient documentation

## 2023-01-29 DIAGNOSIS — I252 Old myocardial infarction: Secondary | ICD-10-CM | POA: Insufficient documentation

## 2023-01-29 DIAGNOSIS — E785 Hyperlipidemia, unspecified: Secondary | ICD-10-CM | POA: Diagnosis not present

## 2023-01-29 DIAGNOSIS — E039 Hypothyroidism, unspecified: Secondary | ICD-10-CM | POA: Diagnosis not present

## 2023-01-29 DIAGNOSIS — E119 Type 2 diabetes mellitus without complications: Secondary | ICD-10-CM | POA: Insufficient documentation

## 2023-01-29 DIAGNOSIS — I2584 Coronary atherosclerosis due to calcified coronary lesion: Secondary | ICD-10-CM | POA: Insufficient documentation

## 2023-01-29 DIAGNOSIS — Z955 Presence of coronary angioplasty implant and graft: Secondary | ICD-10-CM | POA: Diagnosis not present

## 2023-01-29 DIAGNOSIS — I25119 Atherosclerotic heart disease of native coronary artery with unspecified angina pectoris: Secondary | ICD-10-CM

## 2023-01-29 DIAGNOSIS — I25708 Atherosclerosis of coronary artery bypass graft(s), unspecified, with other forms of angina pectoris: Secondary | ICD-10-CM

## 2023-01-29 HISTORY — PX: CORONARY ATHERECTOMY: CATH118238

## 2023-01-29 HISTORY — PX: CORONARY CTO INTERVENTION: CATH118236

## 2023-01-29 HISTORY — PX: CORONARY ULTRASOUND/IVUS: CATH118244

## 2023-01-29 LAB — POCT ACTIVATED CLOTTING TIME
Activated Clotting Time: 250 s
Activated Clotting Time: 256 s
Activated Clotting Time: 279 s
Activated Clotting Time: 245 s
Activated Clotting Time: 262 s
Activated Clotting Time: 302 s
Activated Clotting Time: 279 s
Activated Clotting Time: 279 s
Activated Clotting Time: 320 s
Activated Clotting Time: 273 s
Activated Clotting Time: 152 s

## 2023-01-29 LAB — CBC
HCT: 36.1 % — ABNORMAL LOW (ref 39.0–52.0)
Hemoglobin: 11.6 g/dL — ABNORMAL LOW (ref 13.0–17.0)
MCH: 29.8 pg (ref 26.0–34.0)
MCHC: 32.1 g/dL (ref 30.0–36.0)
MCV: 92.8 fL (ref 80.0–100.0)
Platelets: 160 10*3/uL (ref 150–400)
RBC: 3.89 MIL/uL — ABNORMAL LOW (ref 4.22–5.81)
RDW: 15 % (ref 11.5–15.5)
WBC: 7.2 10*3/uL (ref 4.0–10.5)
nRBC: 0 % (ref 0.0–0.2)

## 2023-01-29 LAB — CREATININE, SERUM
Creatinine, Ser: 0.79 mg/dL (ref 0.61–1.24)
GFR, Estimated: >60 mL/min (ref >60)

## 2023-01-29 LAB — GLUCOSE, CAPILLARY
Glucose-Capillary: 136 mg/dL — ABNORMAL HIGH (ref 70–99)
Glucose-Capillary: 103 mg/dL — ABNORMAL HIGH (ref 70–99)
Glucose-Capillary: 89 mg/dL (ref 70–99)
Glucose-Capillary: 143 mg/dL — ABNORMAL HIGH (ref 70–99)

## 2023-01-29 LAB — HEMOGLOBIN A1C
Hgb A1c MFr Bld: 7 % — ABNORMAL HIGH (ref 4.8–5.6)
Mean Plasma Glucose: 154.2 mg/dL

## 2023-01-29 SURGERY — CORONARY CTO INTERVENTION
Anesthesia: LOCAL

## 2023-01-29 MED ORDER — DOXAZOSIN MESYLATE 2 MG PO TABS
2.0000 mg | ORAL_TABLET | Freq: Every morning | ORAL | Status: DC
  Administered 2023-01-30: 2 mg via ORAL
  Filled 2023-01-29: qty 1

## 2023-01-29 MED ORDER — SODIUM CHLORIDE 0.9 % IV SOLN: 250.0000 mL | INTRAVENOUS | Status: DC | PRN

## 2023-01-29 MED ORDER — ONDANSETRON HCL 4 MG/2ML IJ SOLN: 4.0000 mg | Freq: Four times a day (QID) | INTRAMUSCULAR | Status: DC | PRN

## 2023-01-29 MED ORDER — SODIUM CHLORIDE 0.9% FLUSH: 3.0000 mL | INTRAVENOUS | Status: DC | PRN

## 2023-01-29 MED ORDER — NITROGLYCERIN 1 MG/10 ML FOR IR/CATH LAB
INTRA_ARTERIAL | Status: DC | PRN
  Administered 2023-01-29: 200 ug via INTRACORONARY

## 2023-01-29 MED ORDER — NOREPINEPHRINE 4 MG/250ML-% IV SOLN
INTRAVENOUS | Status: AC
  Filled 2023-01-29: qty 250

## 2023-01-29 MED ORDER — ISOSORBIDE MONONITRATE ER 30 MG PO TB24
30.0000 mg | ORAL_TABLET | Freq: Every day | ORAL | Status: DC
  Administered 2023-01-30: 30 mg via ORAL
  Filled 2023-01-29: qty 1

## 2023-01-29 MED ORDER — MIDAZOLAM HCL 2 MG/2ML IJ SOLN
INTRAMUSCULAR | Status: AC
  Filled 2023-01-29: qty 2

## 2023-01-29 MED ORDER — PRAMIPEXOLE DIHYDROCHLORIDE 0.25 MG PO TABS
0.5000 mg | ORAL_TABLET | Freq: Every day | ORAL | Status: DC
  Administered 2023-01-29: 0.5 mg via ORAL
  Filled 2023-01-29: qty 2

## 2023-01-29 MED ORDER — SODIUM CHLORIDE 0.9% FLUSH: 3.0000 mL | Freq: Two times a day (BID) | INTRAVENOUS | Status: DC

## 2023-01-29 MED ORDER — HEPARIN SODIUM (PORCINE) 1000 UNIT/ML IJ SOLN
INTRAMUSCULAR | Status: AC
  Filled 2023-01-29: qty 10

## 2023-01-29 MED ORDER — SODIUM CHLORIDE 0.9% FLUSH
3.0000 mL | Freq: Two times a day (BID) | INTRAVENOUS | Status: DC
Start: 2023-01-29 — End: 2023-01-30
  Administered 2023-01-29: 3 mL via INTRAVENOUS

## 2023-01-29 MED ORDER — PROTAMINE SULFATE 10 MG/ML IV SOLN
INTRAVENOUS | Status: AC
  Filled 2023-01-29: qty 5

## 2023-01-29 MED ORDER — LABETALOL HCL 5 MG/ML IV SOLN
10.0000 mg | INTRAVENOUS | Status: AC | PRN
Start: 2023-01-29 — End: 2023-01-29

## 2023-01-29 MED ORDER — PROTAMINE SULFATE 10 MG/ML IV SOLN
INTRAVENOUS | Status: DC | PRN
  Administered 2023-01-29: 5 mg via INTRAVENOUS
  Administered 2023-01-29: 25 mg via INTRAVENOUS

## 2023-01-29 MED ORDER — LIDOCAINE HCL (PF) 1 % IJ SOLN
INTRAMUSCULAR | Status: AC
  Filled 2023-01-29: qty 30

## 2023-01-29 MED ORDER — LIDOCAINE HCL (PF) 1 % IJ SOLN
INTRAMUSCULAR | Status: DC | PRN
  Administered 2023-01-29 (×2): 5 mL via INTRADERMAL

## 2023-01-29 MED ORDER — RANOLAZINE ER 500 MG PO TB12
500.0000 mg | ORAL_TABLET | Freq: Two times a day (BID) | ORAL | Status: DC
  Administered 2023-01-29 – 2023-01-30 (×2): 500 mg via ORAL
  Filled 2023-01-29 (×2): qty 1

## 2023-01-29 MED ORDER — SODIUM CHLORIDE 0.9 % IV SOLN: INTRAVENOUS | Status: DC

## 2023-01-29 MED ORDER — ATROPINE SULFATE 1 MG/10ML IJ SOSY
PREFILLED_SYRINGE | INTRAMUSCULAR | Status: AC
  Filled 2023-01-29: qty 10

## 2023-01-29 MED ORDER — ASPIRIN 81 MG PO CHEW
81.0000 mg | CHEWABLE_TABLET | ORAL | Status: DC
Start: 2023-01-29 — End: 2023-01-29

## 2023-01-29 MED ORDER — LIDOCAINE-EPINEPHRINE 1 %-1:100000 IJ SOLN
INTRAMUSCULAR | Status: AC
  Filled 2023-01-29: qty 1

## 2023-01-29 MED ORDER — CHOLESTYRAMINE LIGHT 4 G PO PACK
4.0000 g | PACK | Freq: Every day | ORAL | Status: DC
  Administered 2023-01-29: 4 g via ORAL
  Filled 2023-01-29 (×2): qty 1

## 2023-01-29 MED ORDER — LISINOPRIL 5 MG PO TABS
5.0000 mg | ORAL_TABLET | Freq: Every day | ORAL | Status: DC
  Administered 2023-01-30: 5 mg via ORAL
  Filled 2023-01-29: qty 1

## 2023-01-29 MED ORDER — CLOPIDOGREL BISULFATE 75 MG PO TABS
75.0000 mg | ORAL_TABLET | Freq: Every day | ORAL | Status: DC
  Administered 2023-01-30: 75 mg via ORAL
  Filled 2023-01-29: qty 1

## 2023-01-29 MED ORDER — INSULIN GLARGINE-YFGN 100 UNIT/ML ~~LOC~~ SOLN
20.0000 [IU] | Freq: Every day | SUBCUTANEOUS | Status: DC
  Administered 2023-01-29: 20 [IU] via SUBCUTANEOUS
  Filled 2023-01-29 (×2): qty 0.2

## 2023-01-29 MED ORDER — FENTANYL CITRATE (PF) 100 MCG/2ML IJ SOLN
INTRAMUSCULAR | Status: DC | PRN
  Administered 2023-01-29 (×3): 25 ug via INTRAVENOUS

## 2023-01-29 MED ORDER — IOHEXOL 350 MG/ML SOLN
INTRAVENOUS | Status: DC | PRN
  Administered 2023-01-29: 205 mL via INTRA_ARTERIAL

## 2023-01-29 MED ORDER — HYDRALAZINE HCL 20 MG/ML IJ SOLN
10.0000 mg | INTRAMUSCULAR | Status: AC | PRN
Start: 2023-01-29 — End: 2023-01-29

## 2023-01-29 MED ORDER — CANAGLIFLOZIN 100 MG PO TABS
300.0000 mg | ORAL_TABLET | Freq: Every day | ORAL | Status: DC
  Administered 2023-01-30: 300 mg via ORAL
  Filled 2023-01-29: qty 3
  Filled 2023-01-29: qty 1

## 2023-01-29 MED ORDER — INSULIN ASPART 100 UNIT/ML IJ SOLN: 0.0000 [IU] | Freq: Three times a day (TID) | INTRAMUSCULAR | Status: DC

## 2023-01-29 MED ORDER — MIDAZOLAM HCL 2 MG/2ML IJ SOLN
INTRAMUSCULAR | Status: DC | PRN
  Administered 2023-01-29: 2 mg via INTRAVENOUS
  Administered 2023-01-29 (×2): 1 mg via INTRAVENOUS

## 2023-01-29 MED ORDER — ATORVASTATIN CALCIUM 80 MG PO TABS
80.0000 mg | ORAL_TABLET | Freq: Every day | ORAL | Status: DC
  Administered 2023-01-30: 80 mg via ORAL
  Filled 2023-01-29: qty 1

## 2023-01-29 MED ORDER — NITROGLYCERIN IN D5W 200-5 MCG/ML-% IV SOLN
INTRAVENOUS | Status: AC
  Filled 2023-01-29: qty 250

## 2023-01-29 MED ORDER — INSULIN DEGLUDEC 100 UNIT/ML ~~LOC~~ SOPN: 20.0000 [IU] | PEN_INJECTOR | Freq: Every day | SUBCUTANEOUS | Status: DC

## 2023-01-29 MED ORDER — FENTANYL CITRATE (PF) 100 MCG/2ML IJ SOLN
INTRAMUSCULAR | Status: AC
  Filled 2023-01-29: qty 2

## 2023-01-29 MED ORDER — LEVOTHYROXINE SODIUM 25 MCG PO TABS
125.0000 ug | ORAL_TABLET | Freq: Every morning | ORAL | Status: DC
  Filled 2023-01-29: qty 1

## 2023-01-29 MED ORDER — ENOXAPARIN SODIUM 40 MG/0.4ML IJ SOSY
40.0000 mg | PREFILLED_SYRINGE | INTRAMUSCULAR | Status: DC
  Administered 2023-01-30: 40 mg via SUBCUTANEOUS
  Filled 2023-01-29: qty 0.4

## 2023-01-29 MED ORDER — HEPARIN (PORCINE) IN NACL 1000-0.9 UT/500ML-% IV SOLN
INTRAVENOUS | Status: DC | PRN
  Administered 2023-01-29: 500 mL

## 2023-01-29 MED ORDER — ACETAMINOPHEN 325 MG PO TABS: 650.0000 mg | ORAL_TABLET | ORAL | Status: DC | PRN

## 2023-01-29 MED ORDER — NITROGLYCERIN 1 MG/10 ML FOR IR/CATH LAB
INTRA_ARTERIAL | Status: AC
  Filled 2023-01-29: qty 10

## 2023-01-29 MED ORDER — SODIUM CHLORIDE 0.9 % WEIGHT BASED INFUSION
1.0000 mL/kg/h | INTRAVENOUS | Status: AC
  Administered 2023-01-29 (×2): 1 mL/kg/h via INTRAVENOUS

## 2023-01-29 MED ORDER — ASPIRIN 81 MG PO TBEC
81.0000 mg | DELAYED_RELEASE_TABLET | Freq: Every day | ORAL | Status: DC
  Administered 2023-01-30: 81 mg via ORAL
  Filled 2023-01-29: qty 1

## 2023-01-29 MED ORDER — VERAPAMIL HCL 2.5 MG/ML IV SOLN: INTRA_ARTERIAL | Status: DC | PRN

## 2023-01-29 MED ORDER — HEPARIN SODIUM (PORCINE) 1000 UNIT/ML IJ SOLN
INTRAMUSCULAR | Status: DC | PRN
  Administered 2023-01-29: 2000 [IU] via INTRAVENOUS
  Administered 2023-01-29: 3000 [IU] via INTRAVENOUS
  Administered 2023-01-29: 8000 [IU] via INTRAVENOUS
  Administered 2023-01-29: 2000 [IU] via INTRAVENOUS
  Administered 2023-01-29: 4000 [IU] via INTRAVENOUS
  Administered 2023-01-29: 2000 [IU] via INTRAVENOUS
  Administered 2023-01-29: 3000 [IU] via INTRAVENOUS

## 2023-01-29 MED ORDER — SODIUM CHLORIDE 0.9 % IV SOLN
INTRAVENOUS | Status: DC | PRN
  Administered 2023-01-29: 1 ug/min via INTRAVENOUS

## 2023-01-29 SURGICAL SUPPLY — 46 items
BAG SNAP BAND KOVER 36X36 (MISCELLANEOUS) IMPLANT
BALLN EMERGE MR 2.0X12 (BALLOONS) ×1
BALLN EMERGE MR 2.5X20 (BALLOONS) ×1
BALLN ~~LOC~~ EUPHORA RX 3.0X12 (BALLOONS) ×1
BALLN ~~LOC~~ EUPHORA RX 4.0X12 (BALLOONS) ×1
BALLOON EMERGE MR 2.0X12 (BALLOONS) IMPLANT
BALLOON EMERGE MR 2.5X20 (BALLOONS) IMPLANT
BALLOON TAKERU 1.5X6 (BALLOONS) IMPLANT
BALLOON TAKERU 2.0X8 (BALLOONS) IMPLANT
BALLOON ~~LOC~~ EUPHORA RX 3.0X12 (BALLOONS) IMPLANT
BALLOON ~~LOC~~ EUPHORA RX 4.0X12 (BALLOONS) IMPLANT
BUR ROTAPRO CNCT ADVNCR 1.5 (BURR) IMPLANT
BURR ROTAPRO CNCT ADVNCR 1.5 (BURR) ×1
CATH GUIDELINER COAST (CATHETERS) IMPLANT
CATH INFINITI 5FR JL4 (CATHETERS) IMPLANT
CATH MACH1 8F AL1 90CM (CATHETERS) IMPLANT
CATH MAMBA FLEX 135 (CATHETERS) IMPLANT
CATH OPTICROSS HD (CATHETERS) IMPLANT
CATH TRAPPER 6-8F (CATHETERS) IMPLANT
ELECT DEFIB PAD ADLT CADENCE (PAD) IMPLANT
GUIDEWIRE ROTADRIVE FLOPPY (WIRE) IMPLANT
KIT ENCORE 26 ADVANTAGE (KITS) IMPLANT
KIT HEMO VALVE WATCHDOG (MISCELLANEOUS) IMPLANT
KIT MICROPUNCTURE NIT STIFF (SHEATH) IMPLANT
KIT SYRINGE INJ CVI SPIKEX1 (MISCELLANEOUS) IMPLANT
LUBRICANT ROTAGLIDE 20CC VIAL (MISCELLANEOUS) IMPLANT
PACK CARDIAC CATHETERIZATION (CUSTOM PROCEDURE TRAY) ×1 IMPLANT
SET ATX-X65L (MISCELLANEOUS) IMPLANT
SHEATH BRITE TIP 8FR 35CM (SHEATH) IMPLANT
SHEATH PINNACLE 5F 10CM (SHEATH) IMPLANT
SHEATH PINNACLE 8F 10CM (SHEATH) IMPLANT
SHEATH PINNACLE 9F 10CM (SHEATH) IMPLANT
SHEATH PROBE COVER 6X72 (BAG) IMPLANT
SLED PULL BACK IVUS (MISCELLANEOUS) IMPLANT
STENT SYNERGY XD 2.50X38 (Permanent Stent) IMPLANT
STENT SYNERGY XD 2.50X48 (Permanent Stent) IMPLANT
STENT SYNERGY XD 3.0X20 (Permanent Stent) IMPLANT
SYNERGY XD 2.50X38 (Permanent Stent) ×1 IMPLANT
SYNERGY XD 2.50X48 (Permanent Stent) ×1 IMPLANT
SYNERGY XD 3.0X20 (Permanent Stent) ×1 IMPLANT
SYSTEM COMPRESSION FEMOSTOP (HEMOSTASIS) IMPLANT
TUBING CIL FLEX 10 FLL-RA (TUBING) IMPLANT
WIRE ASAHI PROWATER 180CM (WIRE) IMPLANT
WIRE EMERALD 3MM-J .035X150CM (WIRE) IMPLANT
WIRE HI TORQ WHISPER MS 190CM (WIRE) IMPLANT
WIRE RUNTHROUGH .014X180CM (WIRE) IMPLANT

## 2023-01-29 NOTE — Interval H&P Note (Signed)
History and Physical Interval Note:  01/29/2023 7:08 AM  Nikalas D Maron  has presented today for surgery, with the diagnosis of cto.  The various methods of treatment have been discussed with the patient and family. After consideration of risks, benefits and other options for treatment, the patient has consented to  Procedure(s): CORONARY CTO INTERVENTION (N/A) as a surgical intervention.  The patient's history has been reviewed, patient examined, no change in status, stable for surgery.  I have reviewed the patient's chart and labs.  Questions were answered to the patient's satisfaction.   Cath Lab Visit (complete for each Cath Lab visit)  Clinical Evaluation Leading to the Procedure:   ACS: No.  Non-ACS:    Anginal Classification: CCS II  Anti-ischemic medical therapy: Maximal Therapy (2 or more classes of medications)  Non-Invasive Test Results: High-risk stress test findings: cardiac mortality >3%/year  Prior CABG: Previous CABG        Theron Arista [facility] 01/29/2023 7:08 AM

## 2023-01-29 NOTE — Progress Notes (Signed)
Sheath Pull: Left groin, 5 French sheath. ACT 157, Level 0, ecchymosis noted around sheath insertion site, area outlined. No pain upon palpation. Site covered with gauze and transparent cover. Bedrest starts at 14:50 hours Pedal pulse present by doppler.

## 2023-01-29 NOTE — Progress Notes (Addendum)
Sheath Pull: Right groin, 9 French sheath. ACT 157, Level 0, ecchymosis noted around sheath insertion site, area outlined. No pain upon palpation. Site covered with gauze and transparent cover.  Bedrest starts at 14:50 hours. Pedal pulse present by doppler

## 2023-01-30 ENCOUNTER — Ambulatory Visit: Payer: Self-pay

## 2023-01-30 ENCOUNTER — Telehealth: Payer: Self-pay | Admitting: Cardiology

## 2023-01-30 ENCOUNTER — Encounter (HOSPITAL_COMMUNITY): Payer: Self-pay | Admitting: Cardiology

## 2023-01-30 ENCOUNTER — Ambulatory Visit: Payer: Self-pay | Admitting: *Deleted

## 2023-01-30 DIAGNOSIS — Z7901 Long term (current) use of anticoagulants: Secondary | ICD-10-CM | POA: Diagnosis not present

## 2023-01-30 DIAGNOSIS — I25709 Atherosclerosis of coronary artery bypass graft(s), unspecified, with unspecified angina pectoris: Secondary | ICD-10-CM | POA: Diagnosis not present

## 2023-01-30 DIAGNOSIS — Z953 Presence of xenogenic heart valve: Secondary | ICD-10-CM | POA: Diagnosis not present

## 2023-01-30 DIAGNOSIS — G4733 Obstructive sleep apnea (adult) (pediatric): Secondary | ICD-10-CM | POA: Diagnosis not present

## 2023-01-30 DIAGNOSIS — E119 Type 2 diabetes mellitus without complications: Secondary | ICD-10-CM | POA: Diagnosis not present

## 2023-01-30 DIAGNOSIS — I2510 Atherosclerotic heart disease of native coronary artery without angina pectoris: Secondary | ICD-10-CM | POA: Diagnosis not present

## 2023-01-30 DIAGNOSIS — E785 Hyperlipidemia, unspecified: Secondary | ICD-10-CM | POA: Diagnosis not present

## 2023-01-30 DIAGNOSIS — I252 Old myocardial infarction: Secondary | ICD-10-CM | POA: Diagnosis not present

## 2023-01-30 DIAGNOSIS — I4891 Unspecified atrial fibrillation: Secondary | ICD-10-CM | POA: Diagnosis not present

## 2023-01-30 DIAGNOSIS — Z79899 Other long term (current) drug therapy: Secondary | ICD-10-CM | POA: Diagnosis not present

## 2023-01-30 DIAGNOSIS — Z955 Presence of coronary angioplasty implant and graft: Secondary | ICD-10-CM | POA: Diagnosis not present

## 2023-01-30 DIAGNOSIS — I2582 Chronic total occlusion of coronary artery: Secondary | ICD-10-CM | POA: Diagnosis not present

## 2023-01-30 DIAGNOSIS — I2584 Coronary atherosclerosis due to calcified coronary lesion: Secondary | ICD-10-CM | POA: Diagnosis not present

## 2023-01-30 LAB — LIPID PANEL
Cholesterol: 89 mg/dL (ref 0–200)
HDL: 37 mg/dL — ABNORMAL LOW (ref >40)
LDL Cholesterol: 41 mg/dL (ref 0–99)
Total CHOL/HDL Ratio: 2.4 {ratio}
Triglycerides: 54 mg/dL (ref <150)
VLDL: 11 mg/dL (ref 0–40)

## 2023-01-30 LAB — GLUCOSE, CAPILLARY: Glucose-Capillary: 88 mg/dL (ref 70–99)

## 2023-01-30 LAB — CBC
HCT: 34.3 % — ABNORMAL LOW (ref 39.0–52.0)
Hemoglobin: 11.1 g/dL — ABNORMAL LOW (ref 13.0–17.0)
MCH: 30 pg (ref 26.0–34.0)
MCHC: 32.4 g/dL (ref 30.0–36.0)
MCV: 92.7 fL (ref 80.0–100.0)
Platelets: 154 10*3/uL (ref 150–400)
RBC: 3.7 MIL/uL — ABNORMAL LOW (ref 4.22–5.81)
RDW: 15.2 % (ref 11.5–15.5)
WBC: 5.3 10*3/uL (ref 4.0–10.5)
nRBC: 0 % (ref 0.0–0.2)

## 2023-01-30 LAB — BASIC METABOLIC PANEL
Anion gap: 6 (ref 5–15)
BUN: 15 mg/dL (ref 8–23)
CO2: 22 mmol/L (ref 22–32)
Calcium: 8.5 mg/dL — ABNORMAL LOW (ref 8.9–10.3)
Chloride: 108 mmol/L (ref 98–111)
Creatinine, Ser: 0.93 mg/dL (ref 0.61–1.24)
GFR, Estimated: >60 mL/min (ref >60)
Glucose, Bld: 80 mg/dL (ref 70–99)
Potassium: 3.6 mmol/L (ref 3.5–5.1)
Sodium: 136 mmol/L (ref 135–145)

## 2023-01-30 MED ORDER — POTASSIUM CHLORIDE 20 MEQ PO PACK
40.0000 meq | PACK | Freq: Once | ORAL | Status: AC
  Administered 2023-01-30: 40 meq via ORAL
  Filled 2023-01-30: qty 2

## 2023-01-30 MED FILL — Lidocaine Inj 1% w/ Epinephrine-1:100000: INTRAMUSCULAR | Qty: 20 | Status: AC

## 2023-01-30 MED FILL — Atropine Sulfate Soln Prefill Syr 1 MG/10ML (0.1 MG/ML): INTRAMUSCULAR | Qty: 10 | Status: AC

## 2023-01-30 MED FILL — Nitroglycerin IV Soln 200 MCG/ML in D5W: INTRAVENOUS | Qty: 250 | Status: AC

## 2023-01-30 NOTE — Telephone Encounter (Signed)
  Chief Complaint: low BP hx stent placement yesterday  Symptoms: BP 72/46 drank water and elevated feet , 30 minutes later rechecked BP 112/56.  Frequency: today  Pertinent Negatives: Patient denies chest pain no difficulty breathing no dizziness no pale in color no report of dizziness standing. Denies feeling sick.  Disposition: [x] ED /[] Urgent Care (no appt availability in office) / [] Appointment(In office/virtual)/ []  [place] [facility]/ [] Home Care/ [] Refused Recommended Disposition /[] [place] Mobile Bus/ []  Follow-up with PCP Additional Notes:   Patient thought he was call [facility] and was transferred to NT. Patient wife concerned he may be bleeding internally. Patient continued to deny sx other than low BP . Recommended patient may have to return to ED for evaluation. Continue drinking fluids as tolerated and if sx occur go to ED. Patient is going to try to contact cardiologist again.       Reason for Disposition  [1] Systolic BP < 90 AND [2] NOT dizzy, lightheaded or weak  Answer Assessment - Initial Assessment Questions 1. BLOOD PRESSURE: "What is the blood pressure?" "Did you take at least two measurements 5 minutes apart?"     Prior to call BP 72/46 drank fluids and elevated feet rechecked for BP 112/56. 2. ONSET: "When did you take your blood pressure?"     Prior to NT call  3. HOW: "How did you obtain the blood pressure?" (e.g., visiting nurse, automatic home BP monitor)     Home monitor  4. HISTORY: "Do you have a history of low blood pressure?" "What is your blood pressure normally?"     Yes 95/65 5. MEDICINES: "Are you taking any medications for blood pressure?" If Yes, ask: "Have they been changed recently?"     Yes  6. PULSE RATE: "Do you know what your pulse rate is?"      Na  7. OTHER SYMPTOMS: "Have you been sick recently?" "Have you had a recent injury?"     Recent stent placement yesterday  8. PREGNANCY: "Is there any chance you are pregnant?" "When  was your last menstrual period?"     na  Protocols used: Blood Pressure - Low-A-AH

## 2023-01-30 NOTE — Telephone Encounter (Signed)
Spoke to patient Dr.Jordan's advice given.He will stop Imdur.He will hold Lisinopril and Cardura until his B/P improves.

## 2023-01-30 NOTE — Progress Notes (Signed)
CARDIAC REHAB PHASE I   PRE:  Rate/Rhythm: 82 NSR  BP:  Sitting: 153/81      SpO2: 98 RA  MODE:  Ambulation: 470 ft    POST:  Rate/Rhythm: 111 ST  BP:  Sitting: 160/78      SpO2: 96 RA  Pt ambulated with supervision assist, walked wll w/o CP  Pt was educated on stent card, stent location, Antiplatelet and ASA use, wt restrictions, no baths/daily wash-ups, s/s of infection, ex guidelines, s/s to stop exercising, NTG use and calling 911, heart healthy diet, risk factors and CRPII. Pt received materials on exercise, diet, and CRPII. Will refer to [facility].   Pt completed CRP2 earlier this year.   Faustino Congress  MS, ACSM-CEP 9:25 AM 01/30/2023

## 2023-01-30 NOTE — Telephone Encounter (Signed)
     Chief Complaint: Pt. Reports he had cardiac stents placed yesterday. BP "up and down today."  77/48, 112/56, 94/54, 91/53.  Pulse 74.  Symptoms: Above Frequency: Today Pertinent Negatives: Patient denies any symptoms. No chest pain, SOB, dizziness or weakness. Disposition: [x] ED /[] Urgent Care (no appt availability in office) / [] Appointment(In office/virtual)/ []  [place] [facility]/ [] Home Care/ [] Refused Recommended Disposition /[] [place] Mobile Bus/ []  Follow-up with PCP Additional Notes: Will call cardiology, if no response will go to ED.  Reason for Disposition  Patient sounds very sick or weak to the triager  Answer Assessment - Initial Assessment Questions 1. BLOOD PRESSURE: "What is the blood pressure?" "Did you take at least two measurements 5 minutes apart?"     77/48  91/53 2. ONSET: "When did you take your blood pressure?"     Today 3. HOW: "How did you obtain the blood pressure?" (e.g., visiting nurse, automatic home BP monitor)     Home cuff 4. HISTORY: "Do you have a history of low blood pressure?" "What is your blood pressure normally?"     No 5. MEDICINES: "Are you taking any medications for blood pressure?" If Yes, ask: "Have they been changed recently?"     Lisinopril 6. PULSE RATE: "Do you know what your pulse rate is?"      74 7. OTHER SYMPTOMS: "Have you been sick recently?" "Have you had a recent injury?"     No 8. PREGNANCY: "Is there any chance you are pregnant?" "When was your last menstrual period?"     N/a  Protocols used: Blood Pressure - Low-A-AH

## 2023-01-30 NOTE — Discharge Summary (Addendum)
Discharge Summary    Patient ID: WOODROW SALON MRN: [id number]; DOB: 09-Nov-1950  Admit date: 01/29/2023 Discharge date: 01/30/2023  PCP:  Milus Height, PA   [facility] Providers Cardiologist:  Olga Millers, MD  Cardiology APP:  Marcelino Duster, [place]       Discharge Diagnoses    Principal Problem:   Angina pectoris [facility])    Diagnostic Studies/Procedures    01/20/2023-Echocardiogram: Normal LVEF 65 to 70%.  No RWMA.  Normal RV.  Mild MR.  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  Peak and mean gradients 32 and 18 mmHg.  Normal RAP.  01/29/23 LHC:   Ost RCA to Prox RCA lesion is 95% stenosed.  Mid RCA lesion is 100% stenosed. => Successful CTO revascularization with 3 overlapping stents ostial to distal RCA.  Post intervention, there is a 0% residual stenosis.    Recommend dual antiplatelet therapy with Aspirin 81mg  daily and Clopidogrel 75mg  daily long-term (beyond 12 months) because of multiple stents.   Plan: observe overnight. DAPT indefinitely given multiple stents. Anticipate DC in am.  Diagnostic      Intervention Dominance: Right   _____________   History of Present Illness     Steven Lawrence is a 72 y.o. male who was recently seen at the request of Dr Excell Seltzer for consideration of CTO PCI for management of his CAD. He has past medical history of CAD s/p CABG and aortic valve replacement 2021, hyperlipidemia, type 2 diabetes, OSA, tobacco use. He was hospitalized in 12/2019 for NSTEMI.  Echocardiogram 12/17/2019 showed EF 60-65%, no regional wall motion abnormalities, normal RV function, moderate aortic valve stenosis.  Left heart catheterization on 12/19/2019 showed diffusely calcified vessels, three-vessel disease, preserved LV function.  His RCA was totally occluded and he had high-grade calcified tandem AV groove lesions in the mid LAD lesion.  Recommended evaluation for CABG.  He was seen by CT surgery and ultimately  underwent CABG x 5 (LIMA-LAD, SVG-D1, sequential SVG-OM2-OM3, and SVG-PDA) and aortic valve replacement with a bioprosthetic pericardial valve.  Pre-CABG Dopplers showed 1-39% bilateral ICA disease.  After surgery, patient did have atrial fibrillation that was treated with amiodarone and Eliquis.  Follow-up echocardiogram in 01/2020 showed normal EF, grade 2 DD, stable aortic valve replacement.   Patient underwent stress test on 02/2022 which revealed a large reversible perfusion defect in the lateral and basal-mid inferior LV segment.  He underwent cardiac catheterization on 04/05/2022 which showed patent LIMA-LAD, total occlusion of SVG to OM1 and OM 2, severe stenosis of the ostium of the SVG-diagonal which was treated with DES, and severe stenosis at the ostium of the SVG-PDA which was treated with DES.   He was seen this fall with recurrent chest pain. He underwent cardiac PET scan on 12/24/2022 that showed severe reversible apical to basal inferior/lateral perfusion defect, consistent with large area of ischemia. Study was high risk and recommended cardiac catheterization. Cardiac cath was done showing occlusion of grafts to the RCA and OMs. Patent LIMA to LAD and SVG to diagonal.  At most recent OV with Dr. Swaziland, still experiencing chest pain when he walks quickly or doing yard work. Has had angina when he gets up at night to urinate. Has used sl Ntg 3 times in the past month. Describes typical angina of central chest pain relieved with rest and Ntg. No dyspnea.   Hospital Course     Consultants: n/a   CAD s/p CABG - Native &  Graft CAD Now with vein graft failure including occluded SVG to OM and SVG to PDA. Patent SVG to diagonal after stent in Jan and patent LIMA to LAD. Collaterals to RCA and LCx. Patient underwent complex PCI of RCA with Dr. Swaziland as above. -Continue DAPT with ASA and Plavix indefinitely due to multiple stents to RCA -Continue Imdur 30mg  and Ranexa 500mg  BID. Could consider  stopping Ranexa in OP follow up.     Hypertension -BP well controlled following LHC.  -Continue Lisinopril 5mg , Imdur 30mg , Cardura 2mg   HLD  - Lipid panel from 08/2022 showed LDL 52 - Continue lipitor 80 mg daily   S/p AVR  - Patient underwent AVR in 2021 at the time of CABG  - Most recent echocardiogram from 01/20/23 showed EF 60-65%. Echo findings consistent with normal structure and function of the aortic valve prosthesis   DM type II -Resume Invokana at discharge -Hold Metformin x48 hours following catheterization -Continue Tresiba 20 units at bedtime  Hypothyroidism -Continue Levothyroxine     Due to the nature of the diagnostic procedure, the patient received prolonged fluoroscopy time and should therefore be examined in follow-up for adverse radiation skin effects. The patient was educated to monitor for skin changes and understands to notify our office of such.      Did the patient have an acute coronary syndrome (MI, NSTEMI, STEMI, etc) this admission?:  No                               Did the patient have a percutaneous coronary intervention (stent / angioplasty)?:  Yes.     Cath/PCI Registry Performance & Quality Measures: Aspirin prescribed? - Yes ADP Receptor Inhibitor (Plavix/Clopidogrel, Brilinta/Ticagrelor or Effient/Prasugrel) prescribed (includes medically managed patients)? - Yes High Intensity Statin (Lipitor 40-80mg  or Crestor 20-40mg ) prescribed? - Yes For EF <40%, was ACEI/ARB prescribed? - Not Applicable (EF >/= 40%) For EF <40%, Aldosterone Antagonist (Spironolactone or Eplerenone) prescribed? - Not Applicable (EF >/= 40%) Cardiac Rehab Phase II ordered? - Yes         _____________  Discharge Vitals Blood pressure (!) 153/81, pulse 71, temperature 98.8 F (37.1 C), temperature source Oral, resp. rate 18, height 5\' 9"  (1.753 m), weight 74.8 kg, SpO2 96%.  Filed Weights   01/29/23 0552  Weight: 74.8 kg   Physical Exam Vitals  reviewed.  Constitutional:      Appearance: Normal appearance.  HENT:     Head: Normocephalic.  Eyes:     Pupils: Pupils are equal, round, and reactive to light.  Cardiovascular:     Rate and Rhythm: Normal rate and regular rhythm.     Pulses: Normal pulses.     Heart sounds: Normal heart sounds.  Pulmonary:     Effort: Pulmonary effort is normal.     Breath sounds: Normal breath sounds.  Abdominal:     General: Abdomen is flat.     Palpations: Abdomen is soft.  Musculoskeletal:     Right lower leg: No edema.     Left lower leg: No edema.     Comments: Right groin with very minor amount of ecchymosis. Not firm, non-tender, no active bleeding. Left groin also stable. 3+ peripheral pulses in LE bilaterally  Skin:    General: Skin is warm and dry.     Capillary Refill: Capillary refill takes less than 2 seconds.  Neurological:     General: No focal deficit present.  Mental Status: He is alert and oriented to person, place, and time.  Psychiatric:        Mood and Affect: Mood normal.        Behavior: Behavior normal.        Thought Content: Thought content normal.        Judgment: Judgment normal.      Labs & Radiologic Studies    CBC Recent Labs    01/29/23 1738 01/30/23 0538  WBC 7.2 5.3  HGB 11.6* 11.1*  HCT 36.1* 34.3*  MCV 92.8 92.7  PLT 160 154   Basic Metabolic Panel Recent Labs    84/13/24 1738 01/30/23 0538  NA  --  136  K  --  3.6  CL  --  108  CO2  --  22  GLUCOSE  --  80  BUN  --  15  CREATININE 0.79 0.93  CALCIUM  --  8.5*   Liver Function Tests No results for input(s): "AST", "ALT", "ALKPHOS", "BILITOT", "PROT", "ALBUMIN" in the last 72 hours. No results for input(s): "LIPASE", "AMYLASE" in the last 72 hours. High Sensitivity Troponin:   No results for input(s): "TROPONINIHS" in the last 720 hours.  BNP Invalid input(s): "POCBNP" D-Dimer No results for input(s): "DDIMER" in the last 72 hours. Hemoglobin A1C Recent Labs     01/29/23 1738  HGBA1C 7.0*   Fasting Lipid Panel Recent Labs    01/30/23 0538  CHOL 89  HDL 37*  LDLCALC 41  TRIG 54  CHOLHDL 2.4   Thyroid Function Tests No results for input(s): "TSH", "T4TOTAL", "T3FREE", "THYROIDAB" in the last 72 hours.  Invalid input(s): "FREET3" _____________   Disposition   Pt is being discharged home today in good condition.  Follow-up Plans & Appointments     Discharge Instructions     Amb Referral to Cardiac Rehabilitation   Complete by: As directed    Diagnosis: Coronary Stents   After initial evaluation and assessments completed: Virtual Based Care may be provided alone or in conjunction with Phase 2 Cardiac Rehab based on patient barriers.: Yes   Intensive Cardiac Rehabilitation (ICR) [facility] location only OR Traditional Cardiac Rehabilitation (TCR) *If criteria for ICR are not met will enroll in TCR [facility] only): Yes       Discharge Medications   Allergies as of 01/30/2023       Reactions   Nsaids    Rash- Other Reaction(s): hives (25 years ago) 10/2014        Medication List     TAKE these medications    Accu-Chek Aviva Plus w/Device Kit daily in the afternoon.   Accu-Chek Guide test strip Generic drug: glucose blood 1 each by Other route daily in the afternoon.   acetaminophen 650 MG CR tablet Commonly known as: TYLENOL Take 1,300 mg by mouth 2 (two) times daily.   aspirin EC 81 MG tablet Take 1 tablet (81 mg total) by mouth daily. Swallow whole.   atorvastatin 80 MG tablet Commonly known as: LIPITOR Take 80 mg by mouth daily.   BD Pen Needle Nano 2nd Gen 32G X 4 MM Misc Generic drug: Insulin Pen Needle USE AS DIRECTED ONCE A DAY   canagliflozin 300 MG Tabs tablet Commonly known as: INVOKANA Take 300 mg by mouth daily before breakfast.   cholestyramine light 4 g Powd Take 4 g by mouth daily. 1 Scoop   clopidogrel 75 MG tablet Commonly known as: Plavix Take 1 tablet (75 mg total) by mouth daily.  doxazosin 2 MG tablet Commonly known as: CARDURA Take 2 mg by mouth every morning. Takes for urinary freq   insulin degludec 100 UNIT/ML FlexTouch Pen Commonly known as: TRESIBA Inject 20 Units into the skin at bedtime.   isosorbide mononitrate 30 MG 24 hr tablet Commonly known as: IMDUR Take 1 tablet (30 mg total) by mouth daily.   levothyroxine 125 MCG tablet Commonly known as: SYNTHROID Take 125 mcg by mouth every morning.   lisinopril 5 MG tablet Commonly known as: ZESTRIL Take 5 mg by mouth daily.   metFORMIN 1000 MG tablet Commonly known as: GLUCOPHAGE Take 1,000 mg by mouth 2 (two) times daily with a meal.   nitroGLYCERIN 0.4 MG SL tablet Commonly known as: NITROSTAT Place 1 tablet (0.4 mg total) under the tongue every 5 (five) minutes as needed for chest pain.   pramipexole 0.125 MG tablet Commonly known as: MIRAPEX Take 0.5 mg by mouth at bedtime.   ranolazine 500 MG 12 hr tablet Commonly known as: Ranexa Take 1 tablet (500 mg total) by mouth 2 (two) times daily.           Outstanding Labs/Studies     Duration of Discharge Encounter   Greater than 30 minutes including physician time.  Signed, Perlie Gold, PA-C 01/30/2023, 10:53 AM   ATTENDING ATTESTATION  I have seen, examined and evaluated the patient this morning on rounds along with Perlie Gold, PA.  After reviewing all the available data and chart, we discussed the patients laboratory, study & physical findings as well as symptoms in detail.  I agree with his findings, examination, summary as well as impression recommendations as per our discussion.    Attending adjustments noted in italics.   Doing well post CTO of RCA.  Minimal bruising on the right femoral access site but no hematoma. Already saying that he feels better as far as breathing and energy level.  Walked with mild difficulty.  No angina.  Long-term DAPT and likely lifelong SAPT with Thienopyridine due to extensive full  metal jacket stenting of the RCA. => He does have a pending root canal procedure-I recommend that he discuss with his oral surgeon to determine if antiplatelet agents will need to be held.  If so, would need to push out as long as possible and discussed with cardiology office prior to holding.  Stable for discharge.  Agree with summary above.    Marykay Lex, MD, MS Bryan Lemma, M.D., M.S. Interventional Cardiologist  [facility]  Pager # [phone] Phone # [phone] [address] [place], [place] [address]

## 2023-01-30 NOTE — Plan of Care (Signed)
  Problem: Education: Goal: Understanding of CV disease, CV risk reduction, and recovery process will improve Outcome: Adequate for Discharge Goal: Individualized Educational Video(s) Outcome: Adequate for Discharge   Problem: Activity: Goal: Ability to return to baseline activity level will improve Outcome: Adequate for Discharge   Problem: Cardiovascular: Goal: Ability to achieve and maintain adequate cardiovascular perfusion will improve Outcome: Adequate for Discharge Goal: Vascular access site(s) Level 0-1 will be maintained Outcome: Adequate for Discharge   Problem: Health Behavior/Discharge Planning: Goal: Ability to safely manage health-related needs after discharge will improve Outcome: Adequate for Discharge   Problem: Education: Goal: Knowledge of General Education information will improve Description: Including pain rating scale, medication(s)/side effects and non-pharmacologic comfort measures Outcome: Adequate for Discharge   Problem: Health Behavior/Discharge Planning: Goal: Ability to manage health-related needs will improve Outcome: Adequate for Discharge   Problem: Clinical Measurements: Goal: Ability to maintain clinical measurements within normal limits will improve Outcome: Adequate for Discharge Goal: Will remain free from infection Outcome: Adequate for Discharge Goal: Diagnostic test results will improve Outcome: Adequate for Discharge Goal: Respiratory complications will improve Outcome: Adequate for Discharge Goal: Cardiovascular complication will be avoided Outcome: Adequate for Discharge   Problem: Activity: Goal: Risk for activity intolerance will decrease Outcome: Adequate for Discharge   Problem: Nutrition: Goal: Adequate nutrition will be maintained Outcome: Adequate for Discharge   Problem: Coping: Goal: Level of anxiety will decrease Outcome: Adequate for Discharge   Problem: Elimination: Goal: Will not experience complications  related to bowel motility Outcome: Adequate for Discharge Goal: Will not experience complications related to urinary retention Outcome: Adequate for Discharge   Problem: Pain Management: Goal: General experience of comfort will improve Outcome: Adequate for Discharge   Problem: Safety: Goal: Ability to remain free from injury will improve Outcome: Adequate for Discharge   Problem: Skin Integrity: Goal: Risk for impaired skin integrity will decrease Outcome: Adequate for Discharge   Problem: Education: Goal: Ability to describe self-care measures that may prevent or decrease complications (Diabetes Survival Skills Education) will improve Outcome: Adequate for Discharge Goal: Individualized Educational Video(s) Outcome: Adequate for Discharge   Problem: Coping: Goal: Ability to adjust to condition or change in health will improve Outcome: Adequate for Discharge   Problem: Fluid Volume: Goal: Ability to maintain a balanced intake and output will improve Outcome: Adequate for Discharge   Problem: Health Behavior/Discharge Planning: Goal: Ability to identify and utilize available resources and services will improve Outcome: Adequate for Discharge Goal: Ability to manage health-related needs will improve Outcome: Adequate for Discharge   Problem: Metabolic: Goal: Ability to maintain appropriate glucose levels will improve Outcome: Adequate for Discharge   Problem: Nutritional: Goal: Maintenance of adequate nutrition will improve Outcome: Adequate for Discharge Goal: Progress toward achieving an optimal weight will improve Outcome: Adequate for Discharge   Problem: Skin Integrity: Goal: Risk for impaired skin integrity will decrease Outcome: Adequate for Discharge   Problem: Tissue Perfusion: Goal: Adequacy of tissue perfusion will improve Outcome: Adequate for Discharge

## 2023-01-30 NOTE — Telephone Encounter (Signed)
Spoke to patient he stated since he was discharged from hospital his B/P has been low.Ranging 72/46,112/56,77/48,105/64,102/44 pulse 74 to 76.Stated he feels good.Advised Dr.Jordan is out of office.I will send message to him for advice.

## 2023-01-30 NOTE — Telephone Encounter (Signed)
Already spoke to patient.

## 2023-01-30 NOTE — Telephone Encounter (Signed)
Pt c/o BP issue: STAT if pt c/o blurred vision, one-sided weakness or slurred speech  1. What are your last 5 BP readings? 72/46, 112/56, 77/48 and 105/64-   2. Are you having any other symptoms (ex. Dizziness, headache, blurred vision, passed out)? No symptoms  3. What is your BP issue? Blood pressure is going up and down

## 2023-02-03 ENCOUNTER — Telehealth (HOSPITAL_COMMUNITY): Payer: Self-pay

## 2023-02-03 ENCOUNTER — Telehealth: Payer: Self-pay | Admitting: Cardiology

## 2023-02-03 NOTE — Telephone Encounter (Signed)
Called patient to see if he is interested in the Cardiac Rehab Program. Patient expressed interest. Explained scheduling process and went over insurance, patient verbalized understanding. Will contact patient for scheduling once f/u has been completed.  °

## 2023-02-03 NOTE — Telephone Encounter (Signed)
Patient identification verified by 2 forms. Marilynn Rail, RN    Called and spoke to patient  Patient states:   -Bp an hour ago 70/44, was feeling lightheaded   -Bp has been running this low since procedure last week   -he was advised to stop lisinopril and cardura   -he also stopped Imdur on 11/15  -Bp right now: 101/59 HR: 77  -Right now he feels fine  -has been drinking lots of water   -notes small knot in right groin after bandage was removed today  Patient denies:   -new Back pain  Patient schedule for OV with DOD Dr. Rennis Golden 11/21 at 11:30am  Advised patient:   -continue to hold BP medications   -Lots of fluids   -change position slowly  Reviewed ED warning signs/precautions  Patient verbalized understanding, no questions at this time

## 2023-02-03 NOTE — Telephone Encounter (Signed)
Pt c/o BP issue: STAT if pt c/o blurred vision, one-sided weakness or slurred speech  1. What are your last 5 BP readings? 70/44 10 mins ago and 105/60 this morning  2. Are you having any other symptoms (ex. Dizziness, headache, blurred vision, passed out)? Feeling a little lightheaded  3. What is your BP issue? Pt is concerned about his BP being at these numbers since him having a procedure done last Thursday. He'd like a callback to discuss further.

## 2023-02-03 NOTE — Telephone Encounter (Signed)
Pt insurance is active and benefits verified through Medicare A/B. Co-pay $0.00, DED $240.00/$240.00 met, out of pocket $0.00/$0.00 met, co-insurance 20%. No pre-authorization required. Passport, 02/03/23 @ 2:06PM, REF#[id number]   How many CR sessions are covered? (36 visits for TCR, 72 visits for ICR)72 Is this a lifetime maximum or an annual maximum? Annual Has the member used any of these services to date? Yes, 49 Is there a time limit (weeks/months) on start of program and/or program completion? No   2ndary insurance is active and benefits verified through [employer]. Co-pay $0.00, DED $0.00/$0.00 met, out of pocket $0.00/$0.00 met, co-insurance 0%. No pre-authorization required. Passport, 02/03/23 @ 2:08PM, REF#[id number]      Will contact patient to see if he is interested in the Cardiac Rehab Program. If interested, patient will need to complete follow up appt. Once completed, patient will be contacted for scheduling upon review by the RN Navigator.

## 2023-02-04 NOTE — Telephone Encounter (Signed)
[place], Peter M, MD  You; Cv Div Nl Triage; Charna Elizabeth, LPN3 minutes ago (8:45 AM)    Rather than have him see Dr Rennis Golden why don't we put him on my schedule since I am in the office tomorrow. I would just double book him at 11:40. Thanks  Peter [place]and MD, [facility]   Attempted to call patient. No answer left detailed message informing him appointment will be with Dr. Swaziland on 11/21 at 11:40am instead of Dr. Rennis Golden.

## 2023-02-05 ENCOUNTER — Ambulatory Visit: Payer: Medicare Other | Admitting: Internal Medicine

## 2023-02-05 ENCOUNTER — Encounter: Payer: Self-pay | Admitting: Cardiology

## 2023-02-05 ENCOUNTER — Ambulatory Visit: Payer: Medicare Other | Attending: Cardiology | Admitting: Cardiology

## 2023-02-05 VITALS — BP 120/60 | HR 77 | Ht 69.0 in | Wt 162.0 lb

## 2023-02-05 DIAGNOSIS — Z952 Presence of prosthetic heart valve: Secondary | ICD-10-CM | POA: Diagnosis not present

## 2023-02-05 DIAGNOSIS — Z951 Presence of aortocoronary bypass graft: Secondary | ICD-10-CM | POA: Diagnosis not present

## 2023-02-05 DIAGNOSIS — I25708 Atherosclerosis of coronary artery bypass graft(s), unspecified, with other forms of angina pectoris: Secondary | ICD-10-CM | POA: Diagnosis not present

## 2023-02-05 MED ORDER — DOXAZOSIN MESYLATE 2 MG PO TABS: 2.0000 mg | ORAL_TABLET | Freq: Every day | ORAL | 6 refills | Status: DC

## 2023-02-05 NOTE — Patient Instructions (Addendum)
Medication Instructions:  Start Cardura 2 mg every night Continue all other medications *If you need a refill on your cardiac medications before your next appointment, please call your pharmacy*   Lab Work: None ordered   Testing/Procedures: None ordered   Follow-Up: At [facility], you and your health needs are our priority.  As part of our continuing mission to provide you with exceptional heart care, we have created designated Provider Care Teams.  These Care Teams include your primary Cardiologist (physician) and Advanced Practice Providers (APPs -  Physician Assistants and Nurse Practitioners) who all work together to provide you with the care you need, when you need it.  We recommend signing up for the patient portal called "MyChart".  Sign up information is provided on this After Visit Summary.  MyChart is used to connect with patients for Virtual Visits (Telemedicine).  Patients are able to view lab/test results, encounter notes, upcoming appointments, etc.  Non-urgent messages can be sent to your provider as well.   To learn more about what you can do with MyChart, go to [email or web address].    Your next appointment:  2 to 3 months    Provider:  Dr.Crenshaw's PA

## 2023-02-05 NOTE — Progress Notes (Signed)
Cardiology Office Note:    Date:  02/05/2023   ID:  Steven Lawrence, DOB 1950/10/15, MRN [id number]  PCP:  Milus Height, PA   [facility] Providers Cardiologist:  Olga Millers, MD Cardiology APP:  Marcelino Duster, PA     Referring MD: Milus Height, [place]   Chief Complaint  Patient presents with   Hypotension   Coronary Artery Disease    History of Present Illness:    Steven Lawrence is a 72 y.o. male who is seen for follow up s/p CTO PCI. He has past medical history of CAD s/p CABG and aortic valve replacement 2021, hyperlipidemia, type 2 diabetes, OSA, tobacco use. He was hospitalized in 12/2019 for NSTEMI.  Echocardiogram 12/17/2019 showed EF 60-65%, no regional wall motion abnormalities, normal RV function, moderate aortic valve stenosis.  Left heart catheterization on 12/19/2019 showed diffusely calcified vessels, three-vessel disease, preserved LV function.  His RCA was totally occluded and he had high-grade calcified tandem AV groove lesions in the mid LAD lesion.  Recommended evaluation for CABG.  He was seen by CT surgery and ultimately underwent CABG x 5 (LIMA-LAD, SVG-D1, sequential SVG-OM2-OM3, and SVG-PDA) and aortic valve replacement with a bioprosthetic pericardial valve.  Pre-CABG Dopplers showed 1-39% bilateral ICA disease.  After surgery, patient did have atrial fibrillation that was treated with amiodarone and Eliquis.  Follow-up echocardiogram in 01/2020 showed normal EF, grade 2 DD, stable aortic valve replacement.   Patient underwent stress test on 02/2022 which revealed a large reversible perfusion defect in the lateral and basal-mid inferior LV segment.  He underwent cardiac catheterization on 04/05/2022 which showed patent LIMA-LAD, total occlusion of SVG to OM1 and OM 2, severe stenosis of the ostium of the SVG-diagonal which was treated with DES, and severe stenosis at the ostium of the SVG-PDA which was treated with DES.   He was seen this fall with  recurrent chest pain. He underwent cardiac PET scan on 12/24/2022 that showed severe reversible apical to basal inferior/lateral perfusion defect, consistent with large area of ischemia. Study was high risk and recommended cardiac catheterization. Cardiac cath was done showing occlusion of grafts to the RCA and OMs. Patent LIMA to LAD and SVG to diagonal.   He did undergo CTO PCI of the RCA on 01/29/23 with excellent result. He was DC the following day. Since then he has experienced episodes of hypotension. Cardura, lisinopril and Cardura were held. No symptoms of dizziness. Noted some bruising in right groin with a knot. No back pain. Denies any anginal symptoms.   Past Medical History:  Diagnosis Date   Acid reflux    Arthritis    BPH (benign prostatic hyperplasia)    mild   CAD in native artery    a. NSTEMI 12/2019 s/p CABGx5 with bovine AVR.   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, RDI 10.2/hr ([name])   Urinary frequency    Wears glasses    Wears hearing aid    both ears    Past Surgical History:  Procedure Laterality Date   ANTERIOR LAT LUMBAR FUSION Left  01/04/2015   Procedure: ANTERIOR LATERAL LUMBAR FUSION 1 LEVEL;  Surgeon: Estill Bamberg, MD;  Location: [facility] OR;  Service: Orthopedics;  Laterality: Left;  Left sided lateral interbody fusion lumbar 3-4 with instrumentation and allograft   AORTIC VALVE REPLACEMENT N/A 12/21/2019   Procedure: AORTIC VALVE REPLACEMENT WITH INSPIRIS RESILIA AORTIC VALVE SIZE ;  Surgeon: Loreli Slot, MD;  Location: [facility] OR;  Service: Open Heart Surgery;  Laterality: N/A;   BYPASS GRAFT ANGIOGRAPHY   12/30/2022   Procedure: BYPASS GRAFT ANGIOGRAPHY;  Surgeon: Tonny Bollman, MD;  Location: [facility] INVASIVE CV LAB;  Service: Cardiovascular;;   COLONOSCOPY     CORONARY ANGIOGRAPHY N/A 12/30/2022   Procedure: CORONARY ANGIOGRAPHY;  Surgeon: Tonny Bollman, MD;  Location: [facility] INVASIVE CV LAB;  Service: Cardiovascular;  Laterality: N/A;   CORONARY ARTERY BYPASS GRAFT N/A 12/21/2019   Procedure: CORONARY ARTERY BYPASS GRAFTING TIMES FIVE USING LEFT INTERNAL [employer] ARTERY AND ENDOSCOPICALLY HARVESTED RIGHT GREATER SAPHENOUS VEIN.;  Surgeon: Loreli Slot, MD;  Location: [facility] OR;  Service: Open Heart Surgery;  Laterality: N/A;   CORONARY ATHERECTOMY N/A 01/29/2023   Procedure: CORONARY ATHERECTOMY;  Surgeon: [place], Allissa Albright M, MD;  Location: [facility] INVASIVE CV LAB;  Service: Cardiovascular;  Laterality: N/A;   CORONARY CTO INTERVENTION N/A 01/29/2023   Procedure: CORONARY CTO INTERVENTION;  Surgeon: [place], Pesach Frisch M, MD;  Location: [facility] INVASIVE CV LAB;  Service: Cardiovascular;  Laterality: N/A;   CORONARY STENT INTERVENTION N/A 04/11/2022   Procedure: CORONARY STENT INTERVENTION;  Surgeon: Tonny Bollman, MD;  Location: [facility] INVASIVE CV LAB;  Service: Cardiovascular;  Laterality: N/A;   CORONARY ULTRASOUND/IVUS N/A 01/29/2023   Procedure: Coronary Ultrasound/IVUS;  Surgeon: [place], Rumeal Cullipher M, MD;  Location: [facility] INVASIVE CV LAB;  Service: Cardiovascular;  Laterality: N/A;   ENDOVEIN HARVEST OF GREATER SAPHENOUS VEIN  12/21/2019   Procedure: ENDOVEIN HARVEST OF RIGHT GREATER SAPHENOUS VEIN;  Surgeon: Loreli Slot, MD;  Location: [facility] OR;  Service: Open Heart Surgery;;   ESOPHAGOGASTRODUODENOSCOPY     INGUINAL HERNIA REPAIR Right 08/12/2012   Procedure: HERNIA REPAIR INGUINAL ADULT;  Surgeon: Wilmon Arms. Corliss Skains, MD;  Location: [facility];  Service: General;  Laterality: Right;   INSERTION OF MESH Right 08/12/2012   Procedure: INSERTION OF MESH;  Surgeon: Wilmon Arms. Corliss Skains, MD;  Location: [facility];  Service: General;  Laterality: Right;   LEFT HEART CATH AND CORONARY ANGIOGRAPHY N/A 12/19/2019   Procedure: LEFT HEART CATH AND CORONARY ANGIOGRAPHY;  Surgeon: Runell Gess, MD;  Location: [facility] INVASIVE CV LAB;  Service: Cardiovascular;  Laterality: N/A;   LEFT HEART CATH AND CORS/GRAFTS ANGIOGRAPHY N/A 04/11/2022   Procedure: LEFT HEART CATH AND CORS/GRAFTS ANGIOGRAPHY;  Surgeon: Tonny Bollman, MD;  Location: [facility] INVASIVE CV LAB;  Service: Cardiovascular;  Laterality: N/A;   TEE WITHOUT CARDIOVERSION N/A 12/21/2019   Procedure: TRANSESOPHAGEAL ECHOCARDIOGRAM (TEE);  Surgeon: Loreli Slot, MD;  Location: [facility] OR;  Service: Open Heart Surgery;  Laterality: N/A;   TONSILLECTOMY     VASECTOMY  1985    Current Medications: Current Meds  Medication Sig   ACCU-CHEK GUIDE test strip 1 each by Other route daily in the afternoon.   acetaminophen (TYLENOL) 650 MG CR tablet Take 1,300 mg by mouth 2 (two) times daily.   aspirin EC 81 MG EC tablet Take 1 tablet (81 mg total) by mouth daily. Swallow whole.   atorvastatin (LIPITOR) 80 MG tablet Take 80 mg by mouth daily.    BD PEN NEEDLE  NANO 2ND GEN 32G X 4 MM MISC USE AS DIRECTED ONCE A DAY   canagliflozin (INVOKANA) 300 MG TABS tablet Take 300 mg by mouth daily before breakfast.   cholestyramine light 4 g POWD Take 4 g by mouth daily. 1 Scoop   clopidogrel (PLAVIX) 75 MG tablet Take 1 tablet (75 mg total) by mouth daily.   doxazosin (CARDURA) 2 MG tablet Take 1 tablet (2 mg total) by mouth daily.   insulin degludec (TRESIBA) 100 UNIT/ML FlexTouch Pen Inject 20 Units into the skin at bedtime.   levothyroxine (SYNTHROID) 125 MCG tablet Take 125 mcg by mouth every morning.   metFORMIN (GLUCOPHAGE) 1000 MG tablet Take 1,000 mg by mouth 2 (two) times daily with a meal.   pramipexole (MIRAPEX) 0.125 MG tablet Take 0.5 mg by mouth at bedtime.   ranolazine (RANEXA) 500 MG 12 hr tablet Take 1 tablet (500 mg total) by mouth 2  (two) times daily.     Allergies:   Nsaids   Social History   Socioeconomic History   Marital status: Married    Spouse name: Not on file   Number of children: Not on file   Years of education: Not on file   Highest education level: Not on file  Occupational History   Not on file  Tobacco Use   Smoking status: Former    Current packs/day: 0.00    Average packs/day: 1 pack/day for 53.0 years (53.0 ttl pk-yrs)    Types: Cigarettes    Start date: 12/16/1966    Quit date: 12/16/2019    Years since quitting: 3.1   Smokeless tobacco: Never  Substance and Sexual Activity   Alcohol use: Yes    Comment: occ   Drug use: No   Sexual activity: Not on file  Other Topics Concern   Not on file  Social History Narrative   Not on file   Social Determinants of Health   Financial Resource Strain: Not on file  Food Insecurity: Not on file  Transportation Needs: Not on file  Physical Activity: Not on file  Stress: Not on file  Social Connections: Not on file     Family History: The patient's family history includes Cancer in his father; Diabetes in his brother, mother, and sister; Heart disease in his brother, father, and mother; Multiple sclerosis in his daughter; Parkinson's disease in his mother.  ROS:   Please see the history of present illness.     All other systems reviewed and are negative.  EKGs/Labs/Other Studies Reviewed:    The following studies were reviewed today: Cardiac PET CT 12/24/22:   Severe reversible apical to basal inferior/lateral perfusion defect, consistent with large area of ischemia.  High risk findings including TID (1.27).  Myocardial blood flow reserve not reliable in setting of prior CABG.  Study is high risk and cardiac catheterization is recommended.   LV perfusion is abnormal. There is evidence of ischemia. Defect 1: There is a large defect with severe reduction in uptake present in the apical to basal inferior and lateral location(s) that is  reversible. There is abnormal wall motion in the defect area. Consistent with ischemia.   Rest left ventricular function is abnormal. Rest EF: 44%. Stress left ventricular function is abnormal. Stress EF: 47%. End diastolic cavity size is normal. End systolic cavity size is normal.   Myocardial blood flow reserve is not reported in this patient due to technical or patient-specific concerns that affect accuracy (prior CABG)   Coronary calcium  was present on the attenuation correction CT images. Severe coronary calcifications were present. Coronary calcifications were present in the left anterior descending artery, left circumflex artery and right coronary artery distribution(s).   Findings are consistent with ischemia. The study is high risk.   Electronically signed by Epifanio Lesches, MD   Cardiac cath 12/30/22:  CORONARY ANGIOGRAPHY  BYPASS GRAFT ANGIOGRAPHY   Conclusion      Mid LAD lesion is 80% stenosed.   Prox Cx lesion is 100% stenosed.   Origin to Prox Graft lesion before 1st Mrg  is 100% stenosed.   1st Diag lesion is 100% stenosed.   Origin lesion is 100% stenosed.   Ost RCA to Prox RCA lesion is 95% stenosed.   Mid RCA lesion is 100% stenosed.   Non-stenotic Origin to Prox Graft lesion was previously treated.   and is normal in caliber.   Seq SVG- OM1 and OM2.   The graft exhibits no disease.   1.  Severe native vessel coronary artery disease with severe LAD stenosis, total occlusion of the circumflex, and total occlusion of the RCA 2.  Status post aortocoronary bypass surgery with continued patency of the LIMA to LAD and saphenous vein graft to diagonal 3.  Chronic occlusion of the saphenous vein graft to OM branches 4.  Interval occlusion of the saphenous vein graft to PDA with proximal occlusion within the stented segment of the bypass graft 5.  Multiple sources of collaterals to the distal RCA branches 6.  Multiple sources of collaterals to the OM branches of the  circumflex   Recommendations: Chances of long-term patency with repeat saphenous vein graft intervention are very low.  Favor CTO intervention of the RCA.  Discussed case with Dr. Swaz[place]and.  Will refer the patient for CTO intervention with Dr. [place].    Coronary Diagrams  Diagnostic Dominance: Right  Intervention   CTO PCI 01/29/23: Conclusion      Ost RCA to Prox RCA lesion is 95% stenosed.   Mid RCA lesion is 100% stenosed.   Post intervention, there is a 0% residual stenosis.   Post intervention, there is a 0% residual stenosis.   Recommend dual antiplatelet therapy with Aspirin 81mg  daily and Clopidogrel 75mg  daily long-term (beyond 12 months) because of multiple stents.   Successful CTO PCI of the RCA with 3 overlapping stents extending from the ostium to the distal bifurcation   Plan: observe overnight. DAPT indefinitely given multiple stents. Anticipate DC in am.     Recent Labs: 01/30/2023: BUN 15; Creatinine, Ser 0.93; Hemoglobin 11.1; Platelets 154; Potassium 3.6; Sodium 136  Recent Lipid Panel    Component Value Date/Time   CHOL 89 01/30/2023 0538   CHOL 115 09/05/2020 0943   TRIG 54 01/30/2023 0538   HDL 37 (L) 01/30/2023 0538   HDL 55 09/05/2020 0943   CHOLHDL 2.4 01/30/2023 0538   VLDL 11 01/30/2023 0538   LDLCALC 41 01/30/2023 0538   LDLCALC 48 09/05/2020 0943   Dated 03/13/22: A1c 7.5% Dated 09/11/22: cholesterol 113, triglycerides 54, HDL 49, LDL 52.   Risk Assessment/Calculations:       Physical Exam:    VS:  BP 120/60 (BP Location: Right Arm, Patient Position: Sitting, Cuff Size: Normal)   Pulse 77   Ht 5\' 9"  (1.753 m)   Wt 162 lb (73.5 kg)   SpO2 97%   BMI 23.92 kg/m     Wt Readings from Last 3 Encounters:  02/05/23 162 lb (73.5 kg)  01/29/23  165 lb (74.8 kg)  01/22/23 165 lb (74.8 kg)     GEN:  Well nourished, well developed in no acute distress HEENT: Normal NECK: No JVD; No carotid bruits LYMPHATICS: No lymphadenopathy CARDIAC:  RRR,  gr 2-3/6 harsh systolic murmur RUSB>> apex. no rubs, gallops RESPIRATORY:  Clear to auscultation without rales, wheezing or rhonchi  ABDOMEN: Soft, non-tender, non-distended MUSCULOSKELETAL:  No edema; No deformity. Right groin site with small firm hematoma. No bruit. Left groin looks great SKIN: Warm and dry NEUROLOGIC:  Alert and oriented x 3 PSYCHIATRIC:  Normal affect   ASSESSMENT:    No diagnosis found.  PLAN:    In order of problems listed above:  CAD s/p CABG 3 years ago. Now with vein graft failure including occluded SVG to OM and SVG to PDA. Patent SVG to diagonal after stent in Jan and patent LIMA to LAD. Collaterals to RCA and LCx. I don't think LCx occlusion is amenable to PCI due to extreme tortuosity and calcification. Now S/p CTO PCI of the RCA on 11/14. Looks good today. Did have some hypotension but this has resolved with holding antihypertensives. Will resume Cardura. Continue Ranexa for residual LCx disease. Ok to start cardiac Rehab. Will arrange follow up with Dr Jens Som in 3 months HLD. Last LDL 52 on high dose statin S/p AVR Echo ok HTN    Cardiac Rehabilitation Eligibility Assessment  The patient is ready to start cardiac rehabilitation from a cardiac standpoint.    Signed, Geraldine Tesar Sw[place], MD  02/05/2023 1:06 PM    [facility]

## 2023-02-09 ENCOUNTER — Encounter: Payer: Self-pay | Admitting: Neurosurgery

## 2023-02-10 ENCOUNTER — Ambulatory Visit: Payer: Medicare Other | Admitting: Nurse Practitioner

## 2023-02-19 ENCOUNTER — Ambulatory Visit: Payer: Medicare Other | Admitting: Cardiology

## 2023-03-06 ENCOUNTER — Ambulatory Visit: Payer: Medicare Other | Admitting: Cardiology

## 2023-03-16 ENCOUNTER — Telehealth (HOSPITAL_COMMUNITY): Payer: Self-pay

## 2023-03-16 DIAGNOSIS — I2510 Atherosclerotic heart disease of native coronary artery without angina pectoris: Secondary | ICD-10-CM | POA: Diagnosis not present

## 2023-03-16 DIAGNOSIS — Z0000 Encounter for general adult medical examination without abnormal findings: Secondary | ICD-10-CM | POA: Diagnosis not present

## 2023-03-16 DIAGNOSIS — R911 Solitary pulmonary nodule: Secondary | ICD-10-CM | POA: Diagnosis not present

## 2023-03-16 DIAGNOSIS — E7800 Pure hypercholesterolemia, unspecified: Secondary | ICD-10-CM | POA: Diagnosis not present

## 2023-03-16 DIAGNOSIS — N400 Enlarged prostate without lower urinary tract symptoms: Secondary | ICD-10-CM | POA: Diagnosis not present

## 2023-03-16 DIAGNOSIS — I700 Atherosclerosis of aorta: Secondary | ICD-10-CM | POA: Diagnosis not present

## 2023-03-16 DIAGNOSIS — E113293 Type 2 diabetes mellitus with mild nonproliferative diabetic retinopathy without macular edema, bilateral: Secondary | ICD-10-CM | POA: Diagnosis not present

## 2023-03-16 DIAGNOSIS — E039 Hypothyroidism, unspecified: Secondary | ICD-10-CM | POA: Diagnosis not present

## 2023-03-16 DIAGNOSIS — Z952 Presence of prosthetic heart valve: Secondary | ICD-10-CM | POA: Diagnosis not present

## 2023-03-16 DIAGNOSIS — Z951 Presence of aortocoronary bypass graft: Secondary | ICD-10-CM | POA: Diagnosis not present

## 2023-03-16 DIAGNOSIS — D6869 Other thrombophilia: Secondary | ICD-10-CM | POA: Diagnosis not present

## 2023-03-16 DIAGNOSIS — E1169 Type 2 diabetes mellitus with other specified complication: Secondary | ICD-10-CM | POA: Diagnosis not present

## 2023-03-16 NOTE — Telephone Encounter (Signed)
Called patient to see if he was interested in participating in the Cardiac Rehab Program. Patient stated yes. Patient will come in for orientation on 03/17/23 @ 10:30AM and will attend the 8:15AM exercise class.   Pensions consultant.

## 2023-03-17 ENCOUNTER — Encounter (HOSPITAL_COMMUNITY)
Admission: RE | Admit: 2023-03-17 | Discharge: 2023-03-17 | Disposition: A | Discharge status: Home / Self Care | Payer: Medicare Other | Source: Ambulatory Visit | Attending: Cardiology | Admitting: Cardiology

## 2023-03-17 VITALS — BP 114/60 | HR 71 | Ht 70.0 in | Wt 165.8 lb

## 2023-03-17 DIAGNOSIS — Z955 Presence of coronary angioplasty implant and graft: Secondary | ICD-10-CM | POA: Diagnosis not present

## 2023-03-17 DIAGNOSIS — Z48812 Encounter for surgical aftercare following surgery on the circulatory system: Secondary | ICD-10-CM | POA: Diagnosis present

## 2023-03-17 NOTE — Progress Notes (Signed)
 Cardiac Individual Treatment Plan  Patient Details  Name: Steven Lawrence MRN: [id number] Date of Birth: 1950-04-02 Referring Provider:   Flowsheet Row INTENSIVE CARDIAC REHAB ORIENT from 03/17/2023 in [facility]  Referring Provider Redell Shallow, MD       Initial Encounter Date:  Flowsheet Row INTENSIVE CARDIAC REHAB ORIENT from 03/17/2023 in [facility]  Date 03/17/23       Visit Diagnosis: 01/29/23 Status post coronary artery stent placement RCA x 3  Patient's Home Medications on Admission:  Current Outpatient Medications:    ACCU-CHEK GUIDE test strip, 1 each by Other route daily in the afternoon., Disp: , Rfl:    acetaminophen  (TYLENOL ) 650 MG CR tablet, Take 1,300 mg by mouth 2 (two) times daily., Disp: , Rfl:    aspirin  EC 81 MG EC tablet, Take 1 tablet (81 mg total) by mouth daily. Swallow whole., Disp: 30 tablet, Rfl: 11   atorvastatin  (LIPITOR ) 80 MG tablet, Take 80 mg by mouth daily. , Disp: , Rfl:    BD PEN NEEDLE NANO 2ND GEN 32G X 4 MM MISC, USE AS DIRECTED ONCE A DAY, Disp: , Rfl:    canagliflozin  (INVOKANA ) 300 MG TABS tablet, Take 300 mg by mouth daily before breakfast., Disp: , Rfl:    cholestyramine  light 4 g POWD, Take 4 g by mouth daily. 1 Scoop, Disp: , Rfl:    clopidogrel  (PLAVIX ) 75 MG tablet, Take 1 tablet (75 mg total) by mouth daily., Disp: 30 tablet, Rfl: 11   doxazosin  (CARDURA ) 2 MG tablet, Take 1 tablet (2 mg total) by mouth daily., Disp: 30 tablet, Rfl: 6   insulin  degludec (TRESIBA ) 100 UNIT/ML FlexTouch Pen, Inject 20 Units into the skin at bedtime., Disp: , Rfl:    levothyroxine  (SYNTHROID ) 125 MCG tablet, Take 125 mcg by mouth every morning., Disp: , Rfl:    metFORMIN  (GLUCOPHAGE ) 1000 MG tablet, Take 1,000 mg by mouth 2 (two) times daily with a meal., Disp: , Rfl:    pramipexole  (MIRAPEX ) 0.125 MG tablet, Take 0.5 mg by mouth at  bedtime., Disp: , Rfl:    ranolazine  (RANEXA ) 500 MG 12 hr tablet, Take 1 tablet (500 mg total) by mouth 2 (two) times daily., Disp: 180 tablet, Rfl: 2   Blood Glucose Monitoring Suppl (ACCU-CHEK AVIVA PLUS) w/Device KIT, daily in the afternoon., Disp: , Rfl:    nitroGLYCERIN  (NITROSTAT ) 0.4 MG SL tablet, Place 1 tablet (0.4 mg total) under the tongue every 5 (five) minutes as needed for chest pain., Disp: 25 tablet, Rfl: 3  Past Medical History: Past Medical History:  Diagnosis Date   Acid reflux    Arthritis    BPH (benign prostatic hyperplasia)    mild   CAD in native artery    a. NSTEMI 12/2019 s/p CABGx5 with bovine AVR.   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, RDI 10.2/hr ([name])   Urinary frequency    Wears glasses    Wears hearing aid    both  ears    Tobacco Use: Social History   Tobacco Use  Smoking Status Former   Current packs/day: 0.00   Average packs/day: 1 pack/day for 53.0 years (53.0 ttl pk-yrs)   Types: Cigarettes   Start date: 12/16/1966   Quit date: 12/16/2019   Years since quitting: 3.2  Smokeless Tobacco Never    Labs: Review Flowsheet  More data exists      Latest Ref Rng & Units 12/21/2019 04/10/2020 09/05/2020 01/29/2023 01/30/2023  Labs for ITP Cardiac and Pulmonary Rehab  Cholestrol 0 - 200 mg/dL - 864  884  - 89   LDL (calc) 0 - 99 mg/dL - 75  48  - 41   HDL-C >40 mg/dL - 47  55  - 37   Trlycerides <150 mg/dL - 63  48  - 54   Hemoglobin A1c 4.8 - 5.6 % - - - 7.0  -  PH, Arterial 7.350 - 7.450 7.357  7.404  7.377  7.438  7.437  7.338  7.347  7.411  - - - -  PCO2 arterial 32.0 - 48.0 mmHg 37.2  33.2   37.9  37.1  37.3  47.3  47.8  37.3  - - - -  Bicarbonate 20.0 - 28.0 mmol/L 20.8  20.8  22.6  25.1  25.1  25.4  26.2  23.2  - - - -  TCO2 22 - 32 mmol/L 22  22  24  23  26  26  26  27  27  28  24  25   - - - -  Acid-base deficit 0.0 - 2.0 mmol/L 4.0  3.0  3.0  1.0  0.8  - - - -  O2 Saturation % 97.0  99.0  99.0  100.0  100.0  89.0  100.0  97.9  - - - -    Details       Multiple values from one day are sorted in reverse-chronological order         Capillary Blood Glucose: Lab Results  Component Value Date   GLUCAP 88 01/30/2023   GLUCAP 143 (H) 01/29/2023   GLUCAP 89 01/29/2023   GLUCAP 103 (H) 01/29/2023   GLUCAP 136 (H) 01/29/2023     Exercise Target Goals: Exercise Program Goal: Individual exercise prescription set using results from initial 6 min walk test and THRR while considering  patient's activity barriers and safety.   Exercise Prescription Goal: Initial exercise prescription builds to 30-45 minutes a day of aerobic activity, 2-3 days per week.  Home exercise guidelines will be given to patient during program as part of exercise prescription that the participant will acknowledge.  Activity Barriers & Risk Stratification:  Activity Barriers & Cardiac Risk Stratification - 03/17/23 1220       Activity Barriers & Cardiac Risk Stratification   Activity Barriers Arthritis;Back Problems;Joint Problems;Deconditioning;Balance Concerns    Cardiac Risk Stratification High             6 Minute Walk:  6 Minute Walk     Row Name 03/17/23 1219         6 Minute Walk   Phase Initial     Distance 1833 feet     Walk Time 6 minutes     # of Rest Breaks 0     MPH 3.47     METS 4.4     RPE 13     Perceived Dyspnea  0     VO2 Peak 15.3  Symptoms No     Resting HR 69 bpm     Resting BP 114/60     Resting Oxygen Saturation  97 %     Exercise Oxygen Saturation  during 6 min walk 99 %     Max Ex. HR 119 bpm     Max Ex. BP 152/68     2 Minute Post BP 130/70               Oxygen Initial Assessment:   Oxygen Re-Evaluation:   Oxygen Discharge (Final Oxygen Re-Evaluation):   Initial Exercise Prescription:  Initial Exercise Prescription - 03/17/23 1200       Date of Initial Exercise RX and Referring Provider   Date 03/17/23    Referring Provider Redell Shallow, MD    Expected Discharge Date 06/10/23      Treadmill   MPH 3    Grade 1    Minutes 15    METs 3.71      Recumbant Elliptical   Level 2    RPM 60    Watts 50    Minutes 15    METs 4.1      Prescription Details   Frequency (times per week) 3    Duration Progress to 30 minutes of continuous aerobic without signs/symptoms of physical distress      Intensity   THRR 40-80% of Max Heartrate 59-118    Ratings of Perceived Exertion 11-13    Perceived Dyspnea 0-4      Progression   Progression Continue progressive overload as per policy without signs/symptoms or physical distress.      Resistance Training   Training Prescription Yes    Weight 4 lbs    Reps 10-15             Perform Capillary Blood Glucose checks as needed.  Exercise Prescription Changes:   Exercise Comments:   Exercise Goals and Review:   Exercise Goals     Row Name 03/17/23 1115             Exercise Goals   Increase Physical Activity Yes       Intervention Provide advice, education, support and counseling about physical activity/exercise needs.;Develop an individualized exercise prescription for aerobic and resistive training based on initial evaluation findings, risk stratification, comorbidities and participant's personal goals.       Expected Outcomes Short Term: Attend rehab on a regular basis to increase amount of physical activity.;Long Term: Exercising regularly at least 3-5 days a week.;Long Term: Add in home exercise to make exercise part of routine and to increase amount of physical activity.       Increase Strength and Stamina Yes       Intervention Provide advice,  education, support and counseling about physical activity/exercise needs.;Develop an individualized exercise prescription for aerobic and resistive training based on initial evaluation findings, risk stratification, comorbidities and participant's personal goals.       Expected Outcomes Short Term: Increase workloads from initial exercise prescription for resistance, speed, and METs.;Short Term: Perform resistance training exercises routinely during rehab and add in resistance training at home;Long Term: Improve cardiorespiratory fitness, muscular endurance and strength as measured by increased METs and functional capacity ( )       Able to understand and use rate of perceived exertion (RPE) scale Yes       Intervention Provide education and explanation on how to use RPE scale       Expected Outcomes Short Term: Able to use  RPE daily in rehab to express subjective intensity level;Long Term:  Able to use RPE to guide intensity level when exercising independently       Knowledge and understanding of Target Heart Rate Range (THRR) Yes       Intervention Provide education and explanation of THRR including how the numbers were predicted and where they are located for reference       Expected Outcomes Short Term: Able to state/look up THRR;Long Term: Able to use THRR to govern intensity when exercising independently;Short Term: Able to use daily as guideline for intensity in rehab       Understanding of Exercise Prescription Yes       Intervention Provide education, explanation, and written materials on patient's individual exercise prescription       Expected Outcomes Short Term: Able to explain program exercise prescription;Long Term: Able to explain home exercise prescription to exercise independently                Exercise Goals Re-Evaluation :   Discharge Exercise Prescription (Final Exercise Prescription Changes):   Nutrition:  Target Goals: Understanding of nutrition guidelines, daily  intake of sodium 1500mg , cholesterol 200mg , calories 30% from fat and 7% or less from saturated fats, daily to have 5 or more servings of fruits and vegetables.  Biometrics:  Pre Biometrics - 03/17/23 1052       Pre Biometrics   Waist Circumference 38 inches    Hip Circumference 38.5 inches    Waist to Hip Ratio 0.99 %    Triceps Skinfold 8 mm    % Body Fat 22.7 %    Grip Strength 32 kg    Flexibility 15.25 in    Single Leg Stand 6.62 seconds              Nutrition Therapy Plan and Nutrition Goals:   Nutrition Assessments:  MEDIFICTS Score Key: >=70 Need to make dietary changes  40-70 Heart Healthy Diet <= 40 Therapeutic Level Cholesterol Diet   Flowsheet Row INTENSIVE CARDIAC REHAB from 07/25/2022 in [facility]  Picture Your Plate Total Score on Discharge 82      Picture Your Plate Scores: <59 Unhealthy dietary pattern with much room for improvement. 41-50 Dietary pattern unlikely to meet recommendations for good health and room for improvement. 51-60 More healthful dietary pattern, with some room for improvement.  >60 Healthy dietary pattern, although there may be some specific behaviors that could be improved.    Nutrition Goals Re-Evaluation:   Nutrition Goals Re-Evaluation:   Nutrition Goals Discharge (Final Nutrition Goals Re-Evaluation):   Psychosocial: Target Goals: Acknowledge presence or absence of significant depression and/or stress, maximize coping skills, provide positive support system. Participant is able to verbalize types and ability to use techniques and skills needed for reducing stress and depression.  Initial Review & Psychosocial Screening:  Initial Psych Review & Screening - 03/17/23 1117       Initial Review   Current issues with None Identified      Family Dynamics   Good Support System? Yes   Dunbar has his spouse for support     Barriers   Psychosocial barriers to  participate in program There are no identifiable barriers or psychosocial needs.      Screening Interventions   Interventions Encouraged to exercise             Quality of Life Scores:  Quality of Life - 03/17/23 1223  Quality of Life   Select Quality of Life      Quality of Life Scores   Health/Function Pre 27.4 %    Socioeconomic Pre 29.29 %    Psych/Spiritual Pre 27.86 %    Family Pre 28.8 %    GLOBAL Pre 28.09 %            Scores of 19 and below usually indicate a poorer quality of life in these areas.  A difference of  2-3 points is a clinically meaningful difference.  A difference of 2-3 points in the total score of the Quality of Life Index has been associated with significant improvement in overall quality of life, self-image, physical symptoms, and general health in studies assessing change in quality of life.  PHQ-9: Review Flowsheet  More data may exist      03/17/2023 07/23/2022 05/14/2022 05/10/2020 03/13/2020  Depression screen PHQ 2/9  Decreased Interest 0 0 0 0 0  Down, Depressed, Hopeless 0 0 1 0 0  PHQ - 2 Score 0 0 1 0 0  Altered sleeping 0 0 0 - -  Tired, decreased energy 0 0 1 - -  Change in appetite 0 0 0 - -  Feeling bad or failure about yourself  0 0 0 - -  Trouble concentrating 0 0 0 - -  Moving slowly or fidgety/restless 0 0 0 - -  Suicidal thoughts 0 0 0 - -  PHQ-9 Score 0 0 2 - -  Difficult doing work/chores - Not difficult at all Not difficult at all - -   Interpretation of Total Score  Total Score Depression Severity:  1-4 = Minimal depression, 5-9 = Mild depression, 10-14 = Moderate depression, 15-19 = Moderately severe depression, 20-27 = Severe depression   Psychosocial Evaluation and Intervention:   Psychosocial Re-Evaluation:   Psychosocial Discharge (Final Psychosocial Re-Evaluation):   Vocational Rehabilitation: Provide vocational rehab assistance to qualifying candidates.   Vocational Rehab Evaluation &  Intervention:  Vocational Rehab - 03/17/23 1118       Initial Vocational Rehab Evaluation & Intervention   Assessment shows need for Vocational Rehabilitation No   Ash is retired            Education: Education Goals: Education classes will be provided on a weekly basis, covering required topics. Participant will state understanding/return demonstration of topics presented.     Core Videos: Exercise    Move It!  Clinical staff conducted group or individual video education with verbal and written material and guidebook.  Patient learns the recommended Pritikin exercise program. Exercise with the goal of living a long, healthy life. Some of the health benefits of exercise include controlled diabetes, healthier blood pressure levels, improved cholesterol levels, improved heart and lung capacity, improved sleep, and better body composition. Everyone should speak with their doctor before starting or changing an exercise routine.  Biomechanical Limitations Clinical staff conducted group or individual video education with verbal and written material and guidebook.  Patient learns how biomechanical limitations can impact exercise and how we can mitigate and possibly overcome limitations to have an impactful and balanced exercise routine.  Body Composition Clinical staff conducted group or individual video education with verbal and written material and guidebook.  Patient learns that body composition (ratio of muscle mass to fat mass) is a key component to assessing overall fitness, rather than body weight alone. Increased fat mass, especially visceral belly fat, can put us  at increased risk for metabolic syndrome, type 2 diabetes, heart  disease, and even death. It is recommended to combine diet and exercise (cardiovascular and resistance training) to improve your body composition. Seek guidance from your physician and exercise physiologist before implementing an exercise routine.  Exercise  Action Plan Clinical staff conducted group or individual video education with verbal and written material and guidebook.  Patient learns the recommended strategies to achieve and enjoy long-term exercise adherence, including variety, self-motivation, self-efficacy, and positive decision making. Benefits of exercise include fitness, good health, weight management, more energy, better sleep, less stress, and overall well-being.  Medical   Heart Disease Risk Reduction Clinical staff conducted group or individual video education with verbal and written material and guidebook.  Patient learns our heart is our most vital organ as it circulates oxygen, nutrients, white blood cells, and hormones throughout the entire body, and carries waste away. Data supports a plant-based eating plan like the Pritikin Program for its effectiveness in slowing progression of and reversing heart disease. The video provides a number of recommendations to address heart disease.   Metabolic Syndrome and Belly Fat  Clinical staff conducted group or individual video education with verbal and written material and guidebook.  Patient learns what metabolic syndrome is, how it leads to heart disease, and how one can reverse it and keep it from coming back. You have metabolic syndrome if you have 3 of the following 5 criteria: abdominal obesity, high blood pressure, high triglycerides, low HDL cholesterol, and high blood sugar.  Hypertension and Heart Disease Clinical staff conducted group or individual video education with verbal and written material and guidebook.  Patient learns that high blood pressure, or hypertension, is very common in the [place] . Hypertension is largely due to excessive salt intake, but other important risk factors include being overweight, physical inactivity, drinking too much alcohol, smoking, and not eating enough potassium from fruits and vegetables. High blood pressure is a leading risk factor for  heart attack, stroke, congestive heart failure, dementia, kidney failure, and premature death. Long-term effects of excessive salt intake include stiffening of the arteries and thickening of heart muscle and organ damage. Recommendations include ways to reduce hypertension and the risk of heart disease.  Diseases of Our Time - Focusing on Diabetes Clinical staff conducted group or individual video education with verbal and written material and guidebook.  Patient learns why the best way to stop diseases of our time is prevention, through food and other lifestyle changes. Medicine (such as prescription pills and surgeries) is often only a Band-Aid on the problem, not a long-term solution. Most common diseases of our time include obesity, type 2 diabetes, hypertension, heart disease, and cancer. The Pritikin Program is recommended and has been proven to help reduce, reverse, and/or prevent the damaging effects of metabolic syndrome.  Nutrition   Overview of the Pritikin Eating Plan  Clinical staff conducted group or individual video education with verbal and written material and guidebook.  Patient learns about the Pritikin Eating Plan for disease risk reduction. The Pritikin Eating Plan emphasizes a wide variety of unrefined, minimally-processed carbohydrates, like fruits, vegetables, whole grains, and legumes. Go, Caution, and Stop food choices are explained. Plant-based and lean animal proteins are emphasized. Rationale provided for low sodium intake for blood pressure control, low added sugars for blood sugar stabilization, and low added fats and oils for coronary artery disease risk reduction and weight management.  Calorie Density  Clinical staff conducted group or individual video education with verbal and written material and guidebook.  Patient learns about  calorie density and how it impacts the Pritikin Eating Plan. Knowing the characteristics of the food you choose will help you decide whether  those foods will lead to weight gain or weight loss, and whether you want to consume more or less of them. Weight loss is usually a side effect of the Pritikin Eating Plan because of its focus on low calorie-dense foods.  Label Reading  Clinical staff conducted group or individual video education with verbal and written material and guidebook.  Patient learns about the Pritikin recommended label reading guidelines and corresponding recommendations regarding calorie density, added sugars, sodium content, and whole grains.  Dining Out - Part 1  Clinical staff conducted group or individual video education with verbal and written material and guidebook.  Patient learns that restaurant meals can be sabotaging because they can be so high in calories, fat, sodium, and/or sugar. Patient learns recommended strategies on how to positively address this and avoid unhealthy pitfalls.  Facts on Fats  Clinical staff conducted group or individual video education with verbal and written material and guidebook.  Patient learns that lifestyle modifications can be just as effective, if not more so, as many medications for lowering your risk of heart disease. A Pritikin lifestyle can help to reduce your risk of inflammation and atherosclerosis (cholesterol build-up, or plaque, in the artery walls). Lifestyle interventions such as dietary choices and physical activity address the cause of atherosclerosis. A review of the types of fats and their impact on blood cholesterol levels, along with dietary recommendations to reduce fat intake is also included.  Nutrition Action Plan  Clinical staff conducted group or individual video education with verbal and written material and guidebook.  Patient learns how to incorporate Pritikin recommendations into their lifestyle. Recommendations include planning and keeping personal health goals in mind as an important part of their success.  Healthy Mind-Set    Healthy Minds, Bodies,  Hearts  Clinical staff conducted group or individual video education with verbal and written material and guidebook.  Patient learns how to identify when they are stressed. Video will discuss the impact of that stress, as well as the many benefits of stress management. Patient will also be introduced to stress management techniques. The way we think, act, and feel has an impact on our hearts.  How Our Thoughts Can Heal Our Hearts  Clinical staff conducted group or individual video education with verbal and written material and guidebook.  Patient learns that negative thoughts can cause depression and anxiety. This can result in negative lifestyle behavior and serious health problems. Cognitive behavioral therapy is an effective method to help control our thoughts in order to change and improve our emotional outlook.  Additional Videos:  Exercise    Improving Performance  Clinical staff conducted group or individual video education with verbal and written material and guidebook.  Patient learns to use a non-linear approach by alternating intensity levels and lengths of time spent exercising to help burn more calories and lose more body fat. Cardiovascular exercise helps improve heart health, metabolism, hormonal balance, blood sugar control, and recovery from fatigue. Resistance training improves strength, endurance, balance, coordination, reaction time, metabolism, and muscle mass. Flexibility exercise improves circulation, posture, and balance. Seek guidance from your physician and exercise physiologist before implementing an exercise routine and learn your capabilities and proper form for all exercise.  Introduction to Yoga  Clinical staff conducted group or individual video education with verbal and written material and guidebook.  Patient learns about yoga, a discipline  of the coming together of mind, breath, and body. The benefits of yoga include improved flexibility, improved range of motion,  better posture and core strength, increased lung function, weight loss, and positive self-image. Yoga's heart health benefits include lowered blood pressure, healthier heart rate, decreased cholesterol and triglyceride levels, improved immune function, and reduced stress. Seek guidance from your physician and exercise physiologist before implementing an exercise routine and learn your capabilities and proper form for all exercise.  Medical   Aging: Enhancing Your Quality of Life  Clinical staff conducted group or individual video education with verbal and written material and guidebook.  Patient learns key strategies and recommendations to stay in good physical health and enhance quality of life, such as prevention strategies, having an advocate, securing a Health Care Proxy and Power of Attorney, and keeping a list of medications and system for tracking them. It also discusses how to avoid risk for bone loss.  Biology of Weight Control  Clinical staff conducted group or individual video education with verbal and written material and guidebook.  Patient learns that weight gain occurs because we consume more calories than we burn (eating more, moving less). Even if your body weight is normal, you may have higher ratios of fat compared to muscle mass. Too much body fat puts you at increased risk for cardiovascular disease, heart attack, stroke, type 2 diabetes, and obesity-related cancers. In addition to exercise, following the Pritikin Eating Plan can help reduce your risk.  Decoding Lab Results  Clinical staff conducted group or individual video education with verbal and written material and guidebook.  Patient learns that lab test reflects one measurement whose values change over time and are influenced by many factors, including medication, stress, sleep, exercise, food, hydration, pre-existing medical conditions, and more. It is recommended to use the knowledge from this video to become more involved  with your lab results and evaluate your numbers to speak with your doctor.   Diseases of Our Time - Overview  Clinical staff conducted group or individual video education with verbal and written material and guidebook.  Patient learns that according to the CDC, 50% to 70% of chronic diseases (such as obesity, type 2 diabetes, elevated lipids, hypertension, and heart disease) are avoidable through lifestyle improvements including healthier food choices, listening to satiety cues, and increased physical activity.  Sleep Disorders Clinical staff conducted group or individual video education with verbal and written material and guidebook.  Patient learns how good quality and duration of sleep are important to overall health and well-being. Patient also learns about sleep disorders and how they impact health along with recommendations to address them, including discussing with a physician.  Nutrition  Dining Out - Part 2 Clinical staff conducted group or individual video education with verbal and written material and guidebook.  Patient learns how to plan ahead and communicate in order to maximize their dining experience in a healthy and nutritious manner. Included are recommended food choices based on the type of restaurant the patient is visiting.   Fueling a Banker conducted group or individual video education with verbal and written material and guidebook.  There is a strong connection between our food choices and our health. Diseases like obesity and type 2 diabetes are very prevalent and are in large-part due to lifestyle choices. The Pritikin Eating Plan provides plenty of food and hunger-curbing satisfaction. It is easy to follow, affordable, and helps reduce health risks.  Menu Workshop  Clinical staff conducted group  or individual video education with verbal and written material and guidebook.  Patient learns that restaurant meals can sabotage health goals because  they are often packed with calories, fat, sodium, and sugar. Recommendations include strategies to plan ahead and to communicate with the manager, chef, or server to help order a healthier meal.  Planning Your Eating Strategy  Clinical staff conducted group or individual video education with verbal and written material and guidebook.  Patient learns about the Pritikin Eating Plan and its benefit of reducing the risk of disease. The Pritikin Eating Plan does not focus on calories. Instead, it emphasizes high-quality, nutrient-rich foods. By knowing the characteristics of the foods, we choose, we can determine their calorie density and make informed decisions.  Targeting Your Nutrition Priorities  Clinical staff conducted group or individual video education with verbal and written material and guidebook.  Patient learns that lifestyle habits have a tremendous impact on disease risk and progression. This video provides eating and physical activity recommendations based on your personal health goals, such as reducing LDL cholesterol, losing weight, preventing or controlling type 2 diabetes, and reducing high blood pressure.  Vitamins and Minerals  Clinical staff conducted group or individual video education with verbal and written material and guidebook.  Patient learns different ways to obtain key vitamins and minerals, including through a recommended healthy diet. It is important to discuss all supplements you take with your doctor.   Healthy Mind-Set    Smoking Cessation  Clinical staff conducted group or individual video education with verbal and written material and guidebook.  Patient learns that cigarette smoking and tobacco addiction pose a serious health risk which affects millions of people. Stopping smoking will significantly reduce the risk of heart disease, lung disease, and many forms of cancer. Recommended strategies for quitting are covered, including working with your doctor to develop  a successful plan.  Culinary   Becoming a Set Designer conducted group or individual video education with verbal and written material and guidebook.  Patient learns that cooking at home can be healthy, cost-effective, quick, and puts them in control. Keys to cooking healthy recipes will include looking at your recipe, assessing your equipment needs, planning ahead, making it simple, choosing cost-effective seasonal ingredients, and limiting the use of added fats, salts, and sugars.  Cooking - Breakfast and Snacks  Clinical staff conducted group or individual video education with verbal and written material and guidebook.  Patient learns how important breakfast is to satiety and nutrition through the entire day. Recommendations include key foods to eat during breakfast to help stabilize blood sugar levels and to prevent overeating at meals later in the day. Planning ahead is also a key component.  Cooking - Educational Psychologist conducted group or individual video education with verbal and written material and guidebook.  Patient learns eating strategies to improve overall health, including an approach to cook more at home. Recommendations include thinking of animal protein as a side on your plate rather than center stage and focusing instead on lower calorie dense options like vegetables, fruits, whole grains, and plant-based proteins, such as beans. Making sauces in large quantities to freeze for later and leaving the skin on your vegetables are also recommended to maximize your experience.  Cooking - Healthy Salads and Dressing Clinical staff conducted group or individual video education with verbal and written material and guidebook.  Patient learns that vegetables, fruits, whole grains, and legumes are the foundations of the Pritikin Eating  Plan. Recommendations include how to incorporate each of these in flavorful and healthy salads, and how to create homemade salad  dressings. Proper handling of ingredients is also covered. Cooking - Soups and [employer] - Soups and Desserts Clinical staff conducted group or individual video education with verbal and written material and guidebook.  Patient learns that Pritikin soups and desserts make for easy, nutritious, and delicious snacks and meal components that are low in sodium, fat, sugar, and calorie density, while high in vitamins, minerals, and filling fiber. Recommendations include simple and healthy ideas for soups and desserts.   Overview     The Pritikin Solution Program Overview Clinical staff conducted group or individual video education with verbal and written material and guidebook.  Patient learns that the results of the Pritikin Program have been documented in more than 100 articles published in peer-reviewed journals, and the benefits include reducing risk factors for (and, in some cases, even reversing) high cholesterol, high blood pressure, type 2 diabetes, obesity, and more! An overview of the three key pillars of the Pritikin Program will be covered: eating well, doing regular exercise, and having a healthy mind-set.  WORKSHOPS  Exercise: Exercise Basics: Building Your Action Plan Clinical staff led group instruction and group discussion with PowerPoint presentation and patient guidebook. To enhance the learning environment the use of posters, models and videos may be added. At the conclusion of this workshop, patients will comprehend the difference between physical activity and exercise, as well as the benefits of incorporating both, into their routine. Patients will understand the FITT (Frequency, Intensity, Time, and Type) principle and how to use it to build an exercise action plan. In addition, safety concerns and other considerations for exercise and cardiac rehab will be addressed by the presenter. The purpose of this lesson is to promote a comprehensive and effective weekly exercise  routine in order to improve patients' overall level of fitness.   Managing Heart Disease: Your Path to a Healthier Heart Clinical staff led group instruction and group discussion with PowerPoint presentation and patient guidebook. To enhance the learning environment the use of posters, models and videos may be added.At the conclusion of this workshop, patients will understand the anatomy and physiology of the heart. Additionally, they will understand how Pritikin's three pillars impact the risk factors, the progression, and the management of heart disease.  The purpose of this lesson is to provide a high-level overview of the heart, heart disease, and how the Pritikin lifestyle positively impacts risk factors.  Exercise Biomechanics Clinical staff led group instruction and group discussion with PowerPoint presentation and patient guidebook. To enhance the learning environment the use of posters, models and videos may be added. Patients will learn how the structural parts of their bodies function and how these functions impact their daily activities, movement, and exercise. Patients will learn how to promote a neutral spine, learn how to manage pain, and identify ways to improve their physical movement in order to promote healthy living. The purpose of this lesson is to expose patients to common physical limitations that impact physical activity. Participants will learn practical ways to adapt and manage aches and pains, and to minimize their effect on regular exercise. Patients will learn how to maintain good posture while sitting, walking, and lifting.  Balance Training and Fall Prevention  Clinical staff led group instruction and group discussion with PowerPoint presentation and patient guidebook. To enhance the learning environment the use of posters, models and videos may be added. At  the conclusion of this workshop, patients will understand the importance of their sensorimotor skills  (vision, proprioception, and the vestibular system) in maintaining their ability to balance as they age. Patients will apply a variety of balancing exercises that are appropriate for their current level of function. Patients will understand the common causes for poor balance, possible solutions to these problems, and ways to modify their physical environment in order to minimize their fall risk. The purpose of this lesson is to teach patients about the importance of maintaining balance as they age and ways to minimize their risk of falling.  WORKSHOPS   Nutrition:  Fueling a Ship Broker led group instruction and group discussion with PowerPoint presentation and patient guidebook. To enhance the learning environment the use of posters, models and videos may be added. Patients will review the foundational principles of the Pritikin Eating Plan and understand what constitutes a serving size in each of the food groups. Patients will also learn Pritikin-friendly foods that are better choices when away from home and review make-ahead meal and snack options. Calorie density will be reviewed and applied to three nutrition priorities: weight maintenance, weight loss, and weight gain. The purpose of this lesson is to reinforce (in a group setting) the key concepts around what patients are recommended to eat and how to apply these guidelines when away from home by planning and selecting Pritikin-friendly options. Patients will understand how calorie density may be adjusted for different weight management goals.  Mindful Eating  Clinical staff led group instruction and group discussion with PowerPoint presentation and patient guidebook. To enhance the learning environment the use of posters, models and videos may be added. Patients will briefly review the concepts of the Pritikin Eating Plan and the importance of low-calorie dense foods. The concept of mindful eating will be introduced as well as the  importance of paying attention to internal hunger signals. Triggers for non-hunger eating and techniques for dealing with triggers will be explored. The purpose of this lesson is to provide patients with the opportunity to review the basic principles of the Pritikin Eating Plan, discuss the value of eating mindfully and how to measure internal cues of hunger and fullness using the Hunger Scale. Patients will also discuss reasons for non-hunger eating and learn strategies to use for controlling emotional eating.  Targeting Your Nutrition Priorities Clinical staff led group instruction and group discussion with PowerPoint presentation and patient guidebook. To enhance the learning environment the use of posters, models and videos may be added. Patients will learn how to determine their genetic susceptibility to disease by reviewing their family history. Patients will gain insight into the importance of diet as part of an overall healthy lifestyle in mitigating the impact of genetics and other environmental insults. The purpose of this lesson is to provide patients with the opportunity to assess their personal nutrition priorities by looking at their family history, their own health history and current risk factors. Patients will also be able to discuss ways of prioritizing and modifying the Pritikin Eating Plan for their highest risk areas  Menu  Clinical staff led group instruction and group discussion with PowerPoint presentation and patient guidebook. To enhance the learning environment the use of posters, models and videos may be added. Using menus brought in from [employer], or printed from [employer], patients will apply the Pritikin dining out guidelines that were presented in the [employer] video. Patients will also be able to practice these guidelines in  a variety of provided scenarios. The purpose of this lesson is to provide patients with the opportunity to practice  hands-on learning of the Pritikin Dining Out guidelines with actual menus and practice scenarios.  Label Reading Clinical staff led group instruction and group discussion with PowerPoint presentation and patient guidebook. To enhance the learning environment the use of posters, models and videos may be added. Patients will review and discuss the Pritikin label reading guidelines presented in Pritikin's Label Reading Educational series video. Using fool labels brought in from local grocery stores and markets, patients will apply the label reading guidelines and determine if the packaged food meet the Pritikin guidelines. The purpose of this lesson is to provide patients with the opportunity to review, discuss, and practice hands-on learning of the Pritikin Label Reading guidelines with actual packaged food labels. Cooking School  Pritikin's [employer] are designed to teach patients ways to prepare quick, simple, and affordable recipes at home. The importance of nutrition's role in chronic disease risk reduction is reflected in its emphasis in the overall Pritikin program. By learning how to prepare essential core Pritikin Eating Plan recipes, patients will increase control over what they eat; be able to customize the flavor of foods without the use of added salt, sugar, or fat; and improve the quality of the food they consume. By learning a set of core recipes which are easily assembled, quickly prepared, and affordable, patients are more likely to prepare more healthy foods at home. These workshops focus on convenient breakfasts, simple entres, side dishes, and desserts which can be prepared with minimal effort and are consistent with nutrition recommendations for cardiovascular risk reduction. Cooking [employer] are taught by a armed forces logistics/support/administrative officer (RD) who has been trained by the [employer]. The chef or RD has a clear understanding of the importance of minimizing -  if not completely eliminating - added fat, sugar, and sodium in recipes. Throughout the series of Cooking School Workshop sessions, patients will learn about healthy ingredients and efficient methods of cooking to build confidence in their capability to prepare    Cooking School weekly topics:  Adding Flavor- Sodium-Free  Fast and Healthy Breakfasts  Powerhouse Plant-Based Proteins  Satisfying Salads and Dressings  Simple Sides and Sauces  International Cuisine-Spotlight on the [employer] Zones  Delicious Desserts  Savory Soups  [employer] - Meals in a Astronomer Appetizers and Snacks  Comforting Weekend Breakfasts  One-Pot Wonders   Fast Evening Meals  Landscape Architect Your Pritikin Plate  WORKSHOPS   Healthy Mindset (Psychosocial):  Focused Goals, Sustainable Changes Clinical staff led group instruction and group discussion with PowerPoint presentation and patient guidebook. To enhance the learning environment the use of posters, models and videos may be added. Patients will be able to apply effective goal setting strategies to establish at least one personal goal, and then take consistent, meaningful action toward that goal. They will learn to identify common barriers to achieving personal goals and develop strategies to overcome them. Patients will also gain an understanding of how our mind-set can impact our ability to achieve goals and the importance of cultivating a positive and growth-oriented mind-set. The purpose of this lesson is to provide patients with a deeper understanding of how to set and achieve personal goals, as well as the tools and strategies needed to overcome common obstacles which may arise along the way.  From Head to Heart: The Power of a Psychologist, Occupational  led group instruction and group discussion with PowerPoint presentation and patient guidebook. To enhance the learning environment the use of posters, models and videos may be  added. Patients will be able to recognize and describe the impact of emotions and mood on physical health. They will discover the importance of self-care and explore self-care practices which may work for them. Patients will also learn how to utilize the 4 C's to cultivate a healthier outlook and better manage stress and challenges. The purpose of this lesson is to demonstrate to patients how a healthy outlook is an essential part of maintaining good health, especially as they continue their cardiac rehab journey.  Healthy Sleep for a Healthy Heart Clinical staff led group instruction and group discussion with PowerPoint presentation and patient guidebook. To enhance the learning environment the use of posters, models and videos may be added. At the conclusion of this workshop, patients will be able to demonstrate knowledge of the importance of sleep to overall health, well-being, and quality of life. They will understand the symptoms of, and treatments for, common sleep disorders. Patients will also be able to identify daytime and nighttime behaviors which impact sleep, and they will be able to apply these tools to help manage sleep-related challenges. The purpose of this lesson is to provide patients with a general overview of sleep and outline the importance of quality sleep. Patients will learn about a few of the most common sleep disorders. Patients will also be introduced to the concept of "sleep hygiene," and discover ways to self-manage certain sleeping problems through simple daily behavior changes. Finally, the workshop will motivate patients by clarifying the links between quality sleep and their goals of heart-healthy living.   Recognizing and Reducing Stress Clinical staff led group instruction and group discussion with PowerPoint presentation and patient guidebook. To enhance the learning environment the use of posters, models and videos may be added. At the conclusion of this workshop, patients  will be able to understand the types of stress reactions, differentiate between acute and chronic stress, and recognize the impact that chronic stress has on their health. They will also be able to apply different coping mechanisms, such as reframing negative self-talk. Patients will have the opportunity to practice a variety of stress management techniques, such as deep abdominal breathing, progressive muscle relaxation, and/or guided imagery.  The purpose of this lesson is to educate patients on the role of stress in their lives and to provide healthy techniques for coping with it.  Learning Barriers/Preferences:  Learning Barriers/Preferences - 03/17/23 1224       Learning Barriers/Preferences   Learning Barriers Hearing;Sight   wears glasses and bolateral hearing aids   Learning Preferences Computer/Internet;Skilled Demonstration;Written Material;Individual Instruction;Group Instruction             Education Topics:  Knowledge Questionnaire Score:  Knowledge Questionnaire Score - 03/17/23 1225       Knowledge Questionnaire Score   Pre Score 24/24             Core Components/Risk Factors/Patient Goals at Admission:  Personal Goals and Risk Factors at Admission - 03/17/23 1119       Core Components/Risk Factors/Patient Goals on Admission    Weight Management Weight Maintenance    Diabetes Yes    Intervention Provide education about signs/symptoms and action to take for hypo/hyperglycemia.;Provide education about proper nutrition, including hydration, and aerobic/resistive exercise prescription along with prescribed medications to achieve blood glucose in normal ranges: Fasting glucose 65-99 mg/dL  Expected Outcomes Short Term: Participant verbalizes understanding of the signs/symptoms and immediate care of hyper/hypoglycemia, proper foot care and importance of medication, aerobic/resistive exercise and nutrition plan for blood glucose control.;Long Term: Attainment of HbA1C  < 7%.    Hypertension Yes    Intervention Provide education on lifestyle modifcations including regular physical activity/exercise, weight management, moderate sodium restriction and increased consumption of fresh fruit, vegetables, and low fat dairy, alcohol moderation, and smoking cessation.;Monitor prescription use compliance.    Expected Outcomes Short Term: Continued assessment and intervention until BP is < 140/85mm HG in hypertensive participants. < 130/79mm HG in hypertensive participants with diabetes, heart failure or chronic kidney disease.;Long Term: Maintenance of blood pressure at goal levels.    Lipids Yes    Intervention Provide education and support for participant on nutrition & aerobic/resistive exercise along with prescribed medications to achieve LDL 70mg , HDL >40mg .    Expected Outcomes Short Term: Participant states understanding of desired cholesterol values and is compliant with medications prescribed. Participant is following exercise prescription and nutrition guidelines.;Long Term: Cholesterol controlled with medications as prescribed, with individualized exercise RX and with personalized nutrition plan. Value goals: LDL < 70mg , HDL > 40 mg.             Core Components/Risk Factors/Patient Goals Review:    Core Components/Risk Factors/Patient Goals at Discharge (Final Review):    ITP Comments:  ITP Comments     Row Name 03/17/23 1051           ITP Comments Wilbert Bihari, MD: Medical Director.  Introduction to the Pritikin Education Program/Intensive Cardiac Rehab.  Initial orientation packet reviewed with the patient.                Comments: Participant attended orientation for the cardiac rehabilitation program on  03/17/2023  to perform initial intake and exercise walk test. Patient introduced to the Pritikin Program education and orientation packet was reviewed. Completed 6-minute walk test, measurements, initial ITP, and exercise prescription.  Vital signs stable. Telemetry-normal sinus rhythm, asymptomatic.   Service time was from 10:25 to 12:01 .

## 2023-03-23 ENCOUNTER — Encounter (HOSPITAL_COMMUNITY): Payer: Medicare Other

## 2023-03-25 ENCOUNTER — Encounter (HOSPITAL_COMMUNITY)
Admission: RE | Admit: 2023-03-25 | Discharge: 2023-03-25 | Disposition: A | Discharge status: Home / Self Care | Payer: Medicare Other | Source: Ambulatory Visit | Attending: Cardiology | Admitting: Cardiology

## 2023-03-25 DIAGNOSIS — Z48812 Encounter for surgical aftercare following surgery on the circulatory system: Secondary | ICD-10-CM | POA: Insufficient documentation

## 2023-03-25 DIAGNOSIS — Z955 Presence of coronary angioplasty implant and graft: Secondary | ICD-10-CM | POA: Diagnosis not present

## 2023-03-25 LAB — GLUCOSE, CAPILLARY
Glucose-Capillary: 114 mg/dL — ABNORMAL HIGH (ref 70–99)
Glucose-Capillary: 114 mg/dL — ABNORMAL HIGH (ref 70–99)

## 2023-03-25 NOTE — Progress Notes (Signed)
 Cardiac Individual Treatment Plan  Patient Details  Name: MARKY BURESH MRN: [id number] Date of Birth: [birth year] Referring Provider:   Flowsheet Row INTENSIVE CARDIAC REHAB ORIENT from 03/17/2023 in [facility]  Referring Provider Redell Shallow, MD       Initial Encounter Date:  Flowsheet Row INTENSIVE CARDIAC REHAB ORIENT from 03/17/2023 in [facility], V[facility], [facility]  Date 03/17/23       Visit Diagnosis: 01/29/23 Status post coronary artery stent placement RCA x 3  Patient's Home Medications on Admission:  Current Outpatient Medications:    ACCU-CHEK GUIDE test strip, 1 each by Other route daily in the afternoon., Disp: , Rfl:    acetaminophen  (TYLENOL ) 650 MG CR tablet, Take 1,300 mg by mouth 2 (two) times daily., Disp: , Rfl:    aspirin  EC 81 MG EC tablet, Take 1 tablet (81 mg total) by mouth daily. Swallow whole., Disp: 30 tablet, Rfl: 11   atorvastatin  (LIPITOR ) 80 MG tablet, Take 80 mg by mouth daily. , Disp: , Rfl:    BD PEN NEEDLE NANO 2ND GEN 32G X 4 MM MISC, USE AS DIRECTED ONCE A DAY, Disp: , Rfl:    Blood Glucose Monitoring Suppl (ACCU-CHEK AVIVA PLUS) w/Device KIT, daily in the afternoon., Disp: , Rfl:    canagliflozin  (INVOKANA ) 300 MG TABS tablet, Take 300 mg by mouth daily before breakfast., Disp: , Rfl:    cholestyramine  light 4 g POWD, Take 4 g by mouth daily. 1 Scoop, Disp: , Rfl:    clopidogrel  (PLAVIX ) 75 MG tablet, Take 1 tablet (75 mg total) by mouth daily., Disp: 30 tablet, Rfl: 11   doxazosin  (CARDURA ) 2 MG tablet, Take 1 tablet (2 mg total) by mouth daily., Disp: 30 tablet, Rfl: 6   insulin  degludec (TRESIBA ) 100 UNIT/ML FlexTouch Pen, Inject 20 Units into the skin at bedtime., Disp: , Rfl:    levothyroxine  (SYNTHROID ) 125 MCG tablet, Take 125 mcg by mouth every morning., Disp: , Rfl:    metFORMIN  (GLUCOPHAGE ) 1000 MG tablet, Take 1,000 mg by mouth 2  (two) times daily with a meal., Disp: , Rfl:    nitroGLYCERIN  (NITROSTAT ) 0.4 MG SL tablet, Place 1 tablet (0.4 mg total) under the tongue every 5 (five) minutes as needed for chest pain., Disp: 25 tablet, Rfl: 3   pramipexole  (MIRAPEX ) 0.125 MG tablet, Take 0.5 mg by mouth at bedtime., Disp: , Rfl:    ranolazine  (RANEXA ) 500 MG 12 hr tablet, Take 1 tablet (500 mg total) by mouth 2 (two) times daily., Disp: 180 tablet, Rfl: 2  Past Medical History: Past Medical History:  Diagnosis Date   Acid reflux    Arthritis    BPH (benign prostatic hyperplasia)    mild   CAD in native artery    a. NSTEMI 12/2019 s/p CABGx5 with bovine AVR.   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, RDI 10.2/hr ([name])   Urinary frequency    Wears glasses    Wears hearing aid    both  ears    Tobacco Use: Social History   Tobacco Use  Smoking Status Former   Current packs/day: 0.00   Average packs/day: 1 pack/day for 53.0 years (53.0 ttl pk-yrs)   Types: Cigarettes   Start date: 12/16/1966   Quit date: 12/16/2019   Years since quitting: 3.2  Smokeless Tobacco Never    Labs: Review Flowsheet  More data exists      Latest Ref Rng & Units 12/21/2019 04/10/2020 09/05/2020 01/29/2023 01/30/2023  Labs for ITP Cardiac and Pulmonary Rehab  Cholestrol 0 - 200 mg/dL - 864  884  - 89   LDL (calc) 0 - 99 mg/dL - 75  48  - 41   HDL-C >40 mg/dL - 47  55  - 37   Trlycerides <150 mg/dL - 63  48  - 54   Hemoglobin A1c 4.8 - 5.6 % - - - 7.0  -  PH, Arterial 7.350 - 7.450 7.357  7.404  7.377  7.438  7.437  7.338  7.347  7.411  - - - -  PCO2 arterial 32.0 - 48.0 mmHg 37.2  33.2   37.9  37.1  37.3  47.3  47.8  37.3  - - - -  Bicarbonate 20.0 - 28.0 mmol/L 20.8  20.8  22.6  25.1  25.1  25.4  26.2  23.2  - - - -  TCO2 22 - 32 mmol/L 22  22  24  23  26  26  26  27  27  28  24  25   - - - -  Acid-base deficit 0.0 - 2.0 mmol/L 4.0  3.0  3.0  1.0  0.8  - - - -  O2 Saturation % 97.0  99.0  99.0  100.0  100.0  89.0  100.0  97.9  - - - -    Details       Multiple values from one day are sorted in reverse-chronological order         Capillary Blood Glucose: Lab Results  Component Value Date   GLUCAP 88 01/30/2023   GLUCAP 143 (H) 01/29/2023   GLUCAP 89 01/29/2023   GLUCAP 103 (H) 01/29/2023   GLUCAP 136 (H) 01/29/2023     Exercise Target Goals: Exercise Program Goal: Individual exercise prescription set using results from initial 6 min walk test and THRR while considering  patient's activity barriers and safety.   Exercise Prescription Goal: Initial exercise prescription builds to 30-45 minutes a day of aerobic activity, 2-3 days per week.  Home exercise guidelines will be given to patient during program as part of exercise prescription that the participant will acknowledge.  Activity Barriers & Risk Stratification:  Activity Barriers & Cardiac Risk Stratification - 03/17/23 1220       Activity Barriers & Cardiac Risk Stratification   Activity Barriers Arthritis;Back Problems;Joint Problems;Deconditioning;Balance Concerns    Cardiac Risk Stratification High             6 Minute Walk:  6 Minute Walk     Row Name 03/17/23 1219         6 Minute Walk   Phase Initial     Distance 1833 feet     Walk Time 6 minutes     # of Rest Breaks 0     MPH 3.47     METS 4.4     RPE 13     Perceived Dyspnea  0     VO2 Peak 15.3  Symptoms No     Resting HR 69 bpm     Resting BP 114/60     Resting Oxygen Saturation  97 %     Exercise Oxygen Saturation  during 6 min walk 99 %     Max Ex. HR 119 bpm     Max Ex. BP 152/68     2 Minute Post BP 130/70               Oxygen Initial Assessment:   Oxygen Re-Evaluation:   Oxygen Discharge (Final Oxygen Re-Evaluation):   Initial Exercise Prescription:  Initial Exercise Prescription - 03/17/23 1200       Date of Initial Exercise RX and Referring Provider   Date 03/17/23    Referring Provider Redell Shallow, MD    Expected Discharge Date 06/10/23      Treadmill   MPH 3    Grade 1    Minutes 15    METs 3.71      Recumbant Elliptical   Level 2    RPM 60    Watts 50    Minutes 15    METs 4.1      Prescription Details   Frequency (times per week) 3    Duration Progress to 30 minutes of continuous aerobic without signs/symptoms of physical distress      Intensity   THRR 40-80% of Max Heartrate 59-118    Ratings of Perceived Exertion 11-13    Perceived Dyspnea 0-4      Progression   Progression Continue progressive overload as per policy without signs/symptoms or physical distress.      Resistance Training   Training Prescription Yes    Weight 4 lbs    Reps 10-15             Perform Capillary Blood Glucose checks as needed.  Exercise Prescription Changes:   Exercise Comments:   Exercise Goals and Review:   Exercise Goals     Row Name 03/17/23 1115             Exercise Goals   Increase Physical Activity Yes       Intervention Provide advice, education, support and counseling about physical activity/exercise needs.;Develop an individualized exercise prescription for aerobic and resistive training based on initial evaluation findings, risk stratification, comorbidities and participant's personal goals.       Expected Outcomes Short Term: Attend rehab on a regular basis to increase amount of physical activity.;Long Term: Exercising regularly at least 3-5 days a week.;Long Term: Add in home exercise to make exercise part of routine and to increase amount of physical activity.       Increase Strength and Stamina Yes       Intervention Provide advice,  education, support and counseling about physical activity/exercise needs.;Develop an individualized exercise prescription for aerobic and resistive training based on initial evaluation findings, risk stratification, comorbidities and participant's personal goals.       Expected Outcomes Short Term: Increase workloads from initial exercise prescription for resistance, speed, and METs.;Short Term: Perform resistance training exercises routinely during rehab and add in resistance training at home;Long Term: Improve cardiorespiratory fitness, muscular endurance and strength as measured by increased METs and functional capacity ( )       Able to understand and use rate of perceived exertion (RPE) scale Yes       Intervention Provide education and explanation on how to use RPE scale       Expected Outcomes Short Term: Able to use  RPE daily in rehab to express subjective intensity level;Long Term:  Able to use RPE to guide intensity level when exercising independently       Knowledge and understanding of Target Heart Rate Range (THRR) Yes       Intervention Provide education and explanation of THRR including how the numbers were predicted and where they are located for reference       Expected Outcomes Short Term: Able to state/look up THRR;Long Term: Able to use THRR to govern intensity when exercising independently;Short Term: Able to use daily as guideline for intensity in rehab       Understanding of Exercise Prescription Yes       Intervention Provide education, explanation, and written materials on patient's individual exercise prescription       Expected Outcomes Short Term: Able to explain program exercise prescription;Long Term: Able to explain home exercise prescription to exercise independently                Exercise Goals Re-Evaluation :   Discharge Exercise Prescription (Final Exercise Prescription Changes):   Nutrition:  Target Goals: Understanding of nutrition guidelines, daily  intake of sodium 1500mg , cholesterol 200mg , calories 30% from fat and 7% or less from saturated fats, daily to have 5 or more servings of fruits and vegetables.  Biometrics:  Pre Biometrics - 03/17/23 1052       Pre Biometrics   Waist Circumference 38 inches    Hip Circumference 38.5 inches    Waist to Hip Ratio 0.99 %    Triceps Skinfold 8 mm    % Body Fat 22.7 %    Grip Strength 32 kg    Flexibility 15.25 in    Single Leg Stand 6.62 seconds              Nutrition Therapy Plan and Nutrition Goals:   Nutrition Assessments:  MEDIFICTS Score Key: >=70 Need to make dietary changes  40-70 Heart Healthy Diet <= 40 Therapeutic Level Cholesterol Diet   Flowsheet Row INTENSIVE CARDIAC REHAB from 07/25/2022 in [facility] for Heart, Vascular, & Lung Health  Picture Your Plate Total Score on Discharge 82      Picture Your Plate Scores: <59 Unhealthy dietary pattern with much room for improvement. 41-50 Dietary pattern unlikely to meet recommendations for good health and room for improvement. 51-60 More healthful dietary pattern, with some room for improvement.  >60 Healthy dietary pattern, although there may be some specific behaviors that could be improved.    Nutrition Goals Re-Evaluation:   Nutrition Goals Re-Evaluation:   Nutrition Goals Discharge (Final Nutrition Goals Re-Evaluation):   Psychosocial: Target Goals: Acknowledge presence or absence of significant depression and/or stress, maximize coping skills, provide positive support system. Participant is able to verbalize types and ability to use techniques and skills needed for reducing stress and depression.  Initial Review & Psychosocial Screening:  Initial Psych Review & Screening - 03/17/23 1117       Initial Review   Current issues with None Identified      Family Dynamics   Good Support System? Yes   Rashid has his spouse for support     Barriers   Psychosocial barriers to  participate in program There are no identifiable barriers or psychosocial needs.      Screening Interventions   Interventions Encouraged to exercise             Quality of Life Scores:  Quality of Life - 03/17/23 1223  Quality of Life   Select Quality of Life      Quality of Life Scores   Health/Function Pre 27.4 %    Socioeconomic Pre 29.29 %    Psych/Spiritual Pre 27.86 %    Family Pre 28.8 %    GLOBAL Pre 28.09 %            Scores of 19 and below usually indicate a poorer quality of life in these areas.  A difference of  2-3 points is a clinically meaningful difference.  A difference of 2-3 points in the total score of the Quality of Life Index has been associated with significant improvement in overall quality of life, self-image, physical symptoms, and general health in studies assessing change in quality of life.  PHQ-9: Review Flowsheet  More data may exist      03/17/2023 07/23/2022 05/14/2022 05/10/2020 03/13/2020  Depression screen PHQ 2/9  Decreased Interest 0 0 0 0 0  Down, Depressed, Hopeless 0 0 1 0 0  PHQ - 2 Score 0 0 1 0 0  Altered sleeping 0 0 0 - -  Tired, decreased energy 0 0 1 - -  Change in appetite 0 0 0 - -  Feeling bad or failure about yourself  0 0 0 - -  Trouble concentrating 0 0 0 - -  Moving slowly or fidgety/restless 0 0 0 - -  Suicidal thoughts 0 0 0 - -  PHQ-9 Score 0 0 2 - -  Difficult doing work/chores - Not difficult at all Not difficult at all - -   Interpretation of Total Score  Total Score Depression Severity:  1-4 = Minimal depression, 5-9 = Mild depression, 10-14 = Moderate depression, 15-19 = Moderately severe depression, 20-27 = Severe depression   Psychosocial Evaluation and Intervention:   Psychosocial Re-Evaluation:   Psychosocial Discharge (Final Psychosocial Re-Evaluation):   Vocational Rehabilitation: Provide vocational rehab assistance to qualifying candidates.   Vocational Rehab Evaluation &  Intervention:  Vocational Rehab - 03/17/23 1118       Initial Vocational Rehab Evaluation & Intervention   Assessment shows need for Vocational Rehabilitation No   Davied is retired            Education: Education Goals: Education classes will be provided on a weekly basis, covering required topics. Participant will state understanding/return demonstration of topics presented.     Core Videos: Exercise    Move It!  Clinical staff conducted group or individual video education with verbal and written material and guidebook.  Patient learns the recommended Pritikin exercise program. Exercise with the goal of living a long, healthy life. Some of the health benefits of exercise include controlled diabetes, healthier blood pressure levels, improved cholesterol levels, improved heart and lung capacity, improved sleep, and better body composition. Everyone should speak with their doctor before starting or changing an exercise routine.  Biomechanical Limitations Clinical staff conducted group or individual video education with verbal and written material and guidebook.  Patient learns how biomechanical limitations can impact exercise and how we can mitigate and possibly overcome limitations to have an impactful and balanced exercise routine.  Body Composition Clinical staff conducted group or individual video education with verbal and written material and guidebook.  Patient learns that body composition (ratio of muscle mass to fat mass) is a key component to assessing overall fitness, rather than body weight alone. Increased fat mass, especially visceral belly fat, can put us  at increased risk for metabolic syndrome, type 2 diabetes, heart  disease, and even death. It is recommended to combine diet and exercise (cardiovascular and resistance training) to improve your body composition. Seek guidance from your physician and exercise physiologist before implementing an exercise routine.  Exercise  Action Plan Clinical staff conducted group or individual video education with verbal and written material and guidebook.  Patient learns the recommended strategies to achieve and enjoy long-term exercise adherence, including variety, self-motivation, self-efficacy, and positive decision making. Benefits of exercise include fitness, good health, weight management, more energy, better sleep, less stress, and overall well-being.  Medical   Heart Disease Risk Reduction Clinical staff conducted group or individual video education with verbal and written material and guidebook.  Patient learns our heart is our most vital organ as it circulates oxygen, nutrients, white blood cells, and hormones throughout the entire body, and carries waste away. Data supports a plant-based eating plan like the Pritikin Program for its effectiveness in slowing progression of and reversing heart disease. The video provides a number of recommendations to address heart disease.   Metabolic Syndrome and Belly Fat  Clinical staff conducted group or individual video education with verbal and written material and guidebook.  Patient learns what metabolic syndrome is, how it leads to heart disease, and how one can reverse it and keep it from coming back. You have metabolic syndrome if you have 3 of the following 5 criteria: abdominal obesity, high blood pressure, high triglycerides, low HDL cholesterol, and high blood sugar.  Hypertension and Heart Disease Clinical staff conducted group or individual video education with verbal and written material and guidebook.  Patient learns that high blood pressure, or hypertension, is very common in the [place] . Hypertension is largely due to excessive salt intake, but other important risk factors include being overweight, physical inactivity, drinking too much alcohol, smoking, and not eating enough potassium from fruits and vegetables. High blood pressure is a leading risk factor for  heart attack, stroke, congestive heart failure, dementia, kidney failure, and premature death. Long-term effects of excessive salt intake include stiffening of the arteries and thickening of heart muscle and organ damage. Recommendations include ways to reduce hypertension and the risk of heart disease.  Diseases of Our Time - Focusing on Diabetes Clinical staff conducted group or individual video education with verbal and written material and guidebook.  Patient learns why the best way to stop diseases of our time is prevention, through food and other lifestyle changes. Medicine (such as prescription pills and surgeries) is often only a Band-Aid on the problem, not a long-term solution. Most common diseases of our time include obesity, type 2 diabetes, hypertension, heart disease, and cancer. The Pritikin Program is recommended and has been proven to help reduce, reverse, and/or prevent the damaging effects of metabolic syndrome.  Nutrition   Overview of the Pritikin Eating Plan  Clinical staff conducted group or individual video education with verbal and written material and guidebook.  Patient learns about the Pritikin Eating Plan for disease risk reduction. The Pritikin Eating Plan emphasizes a wide variety of unrefined, minimally-processed carbohydrates, like fruits, vegetables, whole grains, and legumes. Go, Caution, and Stop food choices are explained. Plant-based and lean animal proteins are emphasized. Rationale provided for low sodium intake for blood pressure control, low added sugars for blood sugar stabilization, and low added fats and oils for coronary artery disease risk reduction and weight management.  Calorie Density  Clinical staff conducted group or individual video education with verbal and written material and guidebook.  Patient learns about  calorie density and how it impacts the Pritikin Eating Plan. Knowing the characteristics of the food you choose will help you decide whether  those foods will lead to weight gain or weight loss, and whether you want to consume more or less of them. Weight loss is usually a side effect of the Pritikin Eating Plan because of its focus on low calorie-dense foods.  Label Reading  Clinical staff conducted group or individual video education with verbal and written material and guidebook.  Patient learns about the Pritikin recommended label reading guidelines and corresponding recommendations regarding calorie density, added sugars, sodium content, and whole grains.  Dining Out - Part 1  Clinical staff conducted group or individual video education with verbal and written material and guidebook.  Patient learns that restaurant meals can be sabotaging because they can be so high in calories, fat, sodium, and/or sugar. Patient learns recommended strategies on how to positively address this and avoid unhealthy pitfalls.  Facts on Fats  Clinical staff conducted group or individual video education with verbal and written material and guidebook.  Patient learns that lifestyle modifications can be just as effective, if not more so, as many medications for lowering your risk of heart disease. A Pritikin lifestyle can help to reduce your risk of inflammation and atherosclerosis (cholesterol build-up, or plaque, in the artery walls). Lifestyle interventions such as dietary choices and physical activity address the cause of atherosclerosis. A review of the types of fats and their impact on blood cholesterol levels, along with dietary recommendations to reduce fat intake is also included.  Nutrition Action Plan  Clinical staff conducted group or individual video education with verbal and written material and guidebook.  Patient learns how to incorporate Pritikin recommendations into their lifestyle. Recommendations include planning and keeping personal health goals in mind as an important part of their success.  Healthy Mind-Set    Healthy Minds, Bodies,  Hearts  Clinical staff conducted group or individual video education with verbal and written material and guidebook.  Patient learns how to identify when they are stressed. Video will discuss the impact of that stress, as well as the many benefits of stress management. Patient will also be introduced to stress management techniques. The way we think, act, and feel has an impact on our hearts.  How Our Thoughts Can Heal Our Hearts  Clinical staff conducted group or individual video education with verbal and written material and guidebook.  Patient learns that negative thoughts can cause depression and anxiety. This can result in negative lifestyle behavior and serious health problems. Cognitive behavioral therapy is an effective method to help control our thoughts in order to change and improve our emotional outlook.  Additional Videos:  Exercise    Improving Performance  Clinical staff conducted group or individual video education with verbal and written material and guidebook.  Patient learns to use a non-linear approach by alternating intensity levels and lengths of time spent exercising to help burn more calories and lose more body fat. Cardiovascular exercise helps improve heart health, metabolism, hormonal balance, blood sugar control, and recovery from fatigue. Resistance training improves strength, endurance, balance, coordination, reaction time, metabolism, and muscle mass. Flexibility exercise improves circulation, posture, and balance. Seek guidance from your physician and exercise physiologist before implementing an exercise routine and learn your capabilities and proper form for all exercise.  Introduction to Yoga  Clinical staff conducted group or individual video education with verbal and written material and guidebook.  Patient learns about yoga, a discipline  of the coming together of mind, breath, and body. The benefits of yoga include improved flexibility, improved range of motion,  better posture and core strength, increased lung function, weight loss, and positive self-image. Yoga's heart health benefits include lowered blood pressure, healthier heart rate, decreased cholesterol and triglyceride levels, improved immune function, and reduced stress. Seek guidance from your physician and exercise physiologist before implementing an exercise routine and learn your capabilities and proper form for all exercise.  Medical   Aging: Enhancing Your Quality of Life  Clinical staff conducted group or individual video education with verbal and written material and guidebook.  Patient learns key strategies and recommendations to stay in good physical health and enhance quality of life, such as prevention strategies, having an advocate, securing a Health Care Proxy and Power of Attorney, and keeping a list of medications and system for tracking them. It also discusses how to avoid risk for bone loss.  Biology of Weight Control  Clinical staff conducted group or individual video education with verbal and written material and guidebook.  Patient learns that weight gain occurs because we consume more calories than we burn (eating more, moving less). Even if your body weight is normal, you may have higher ratios of fat compared to muscle mass. Too much body fat puts you at increased risk for cardiovascular disease, heart attack, stroke, type 2 diabetes, and obesity-related cancers. In addition to exercise, following the Pritikin Eating Plan can help reduce your risk.  Decoding Lab Results  Clinical staff conducted group or individual video education with verbal and written material and guidebook.  Patient learns that lab test reflects one measurement whose values change over time and are influenced by many factors, including medication, stress, sleep, exercise, food, hydration, pre-existing medical conditions, and more. It is recommended to use the knowledge from this video to become more involved  with your lab results and evaluate your numbers to speak with your doctor.   Diseases of Our Time - Overview  Clinical staff conducted group or individual video education with verbal and written material and guidebook.  Patient learns that according to the CDC, 50% to 70% of chronic diseases (such as obesity, type 2 diabetes, elevated lipids, hypertension, and heart disease) are avoidable through lifestyle improvements including healthier food choices, listening to satiety cues, and increased physical activity.  Sleep Disorders Clinical staff conducted group or individual video education with verbal and written material and guidebook.  Patient learns how good quality and duration of sleep are important to overall health and well-being. Patient also learns about sleep disorders and how they impact health along with recommendations to address them, including discussing with a physician.  Nutrition  Dining Out - Part 2 Clinical staff conducted group or individual video education with verbal and written material and guidebook.  Patient learns how to plan ahead and communicate in order to maximize their dining experience in a healthy and nutritious manner. Included are recommended food choices based on the type of restaurant the patient is visiting.   Fueling a Banker conducted group or individual video education with verbal and written material and guidebook.  There is a strong connection between our food choices and our health. Diseases like obesity and type 2 diabetes are very prevalent and are in large-part due to lifestyle choices. The Pritikin Eating Plan provides plenty of food and hunger-curbing satisfaction. It is easy to follow, affordable, and helps reduce health risks.  Menu Workshop  Clinical staff conducted group  or individual video education with verbal and written material and guidebook.  Patient learns that restaurant meals can sabotage health goals because  they are often packed with calories, fat, sodium, and sugar. Recommendations include strategies to plan ahead and to communicate with the manager, chef, or server to help order a healthier meal.  Planning Your Eating Strategy  Clinical staff conducted group or individual video education with verbal and written material and guidebook.  Patient learns about the Pritikin Eating Plan and its benefit of reducing the risk of disease. The Pritikin Eating Plan does not focus on calories. Instead, it emphasizes high-quality, nutrient-rich foods. By knowing the characteristics of the foods, we choose, we can determine their calorie density and make informed decisions.  Targeting Your Nutrition Priorities  Clinical staff conducted group or individual video education with verbal and written material and guidebook.  Patient learns that lifestyle habits have a tremendous impact on disease risk and progression. This video provides eating and physical activity recommendations based on your personal health goals, such as reducing LDL cholesterol, losing weight, preventing or controlling type 2 diabetes, and reducing high blood pressure.  Vitamins and Minerals  Clinical staff conducted group or individual video education with verbal and written material and guidebook.  Patient learns different ways to obtain key vitamins and minerals, including through a recommended healthy diet. It is important to discuss all supplements you take with your doctor.   Healthy Mind-Set    Smoking Cessation  Clinical staff conducted group or individual video education with verbal and written material and guidebook.  Patient learns that cigarette smoking and tobacco addiction pose a serious health risk which affects millions of people. Stopping smoking will significantly reduce the risk of heart disease, lung disease, and many forms of cancer. Recommended strategies for quitting are covered, including working with your doctor to develop  a successful plan.  Culinary   Becoming a Set Designer conducted group or individual video education with verbal and written material and guidebook.  Patient learns that cooking at home can be healthy, cost-effective, quick, and puts them in control. Keys to cooking healthy recipes will include looking at your recipe, assessing your equipment needs, planning ahead, making it simple, choosing cost-effective seasonal ingredients, and limiting the use of added fats, salts, and sugars.  Cooking - Breakfast and Snacks  Clinical staff conducted group or individual video education with verbal and written material and guidebook.  Patient learns how important breakfast is to satiety and nutrition through the entire day. Recommendations include key foods to eat during breakfast to help stabilize blood sugar levels and to prevent overeating at meals later in the day. Planning ahead is also a key component.  Cooking - Educational Psychologist conducted group or individual video education with verbal and written material and guidebook.  Patient learns eating strategies to improve overall health, including an approach to cook more at home. Recommendations include thinking of animal protein as a side on your plate rather than center stage and focusing instead on lower calorie dense options like vegetables, fruits, whole grains, and plant-based proteins, such as beans. Making sauces in large quantities to freeze for later and leaving the skin on your vegetables are also recommended to maximize your experience.  Cooking - Healthy Salads and Dressing Clinical staff conducted group or individual video education with verbal and written material and guidebook.  Patient learns that vegetables, fruits, whole grains, and legumes are the foundations of the Pritikin Eating  Plan. Recommendations include how to incorporate each of these in flavorful and healthy salads, and how to create homemade salad  dressings. Proper handling of ingredients is also covered. Cooking - Soups and [employer] - Soups and Desserts Clinical staff conducted group or individual video education with verbal and written material and guidebook.  Patient learns that Pritikin soups and desserts make for easy, nutritious, and delicious snacks and meal components that are low in sodium, fat, sugar, and calorie density, while high in vitamins, minerals, and filling fiber. Recommendations include simple and healthy ideas for soups and desserts.   Overview     The Pritikin Solution Program Overview Clinical staff conducted group or individual video education with verbal and written material and guidebook.  Patient learns that the results of the Pritikin Program have been documented in more than 100 articles published in peer-reviewed journals, and the benefits include reducing risk factors for (and, in some cases, even reversing) high cholesterol, high blood pressure, type 2 diabetes, obesity, and more! An overview of the three key pillars of the Pritikin Program will be covered: eating well, doing regular exercise, and having a healthy mind-set.  WORKSHOPS  Exercise: Exercise Basics: Building Your Action Plan Clinical staff led group instruction and group discussion with PowerPoint presentation and patient guidebook. To enhance the learning environment the use of posters, models and videos may be added. At the conclusion of this workshop, patients will comprehend the difference between physical activity and exercise, as well as the benefits of incorporating both, into their routine. Patients will understand the FITT (Frequency, Intensity, Time, and Type) principle and how to use it to build an exercise action plan. In addition, safety concerns and other considerations for exercise and cardiac rehab will be addressed by the presenter. The purpose of this lesson is to promote a comprehensive and effective weekly exercise  routine in order to improve patients' overall level of fitness.   Managing Heart Disease: Your Path to a Healthier Heart Clinical staff led group instruction and group discussion with PowerPoint presentation and patient guidebook. To enhance the learning environment the use of posters, models and videos may be added.At the conclusion of this workshop, patients will understand the anatomy and physiology of the heart. Additionally, they will understand how Pritikin's three pillars impact the risk factors, the progression, and the management of heart disease.  The purpose of this lesson is to provide a high-level overview of the heart, heart disease, and how the Pritikin lifestyle positively impacts risk factors.  Exercise Biomechanics Clinical staff led group instruction and group discussion with PowerPoint presentation and patient guidebook. To enhance the learning environment the use of posters, models and videos may be added. Patients will learn how the structural parts of their bodies function and how these functions impact their daily activities, movement, and exercise. Patients will learn how to promote a neutral spine, learn how to manage pain, and identify ways to improve their physical movement in order to promote healthy living. The purpose of this lesson is to expose patients to common physical limitations that impact physical activity. Participants will learn practical ways to adapt and manage aches and pains, and to minimize their effect on regular exercise. Patients will learn how to maintain good posture while sitting, walking, and lifting.  Balance Training and Fall Prevention  Clinical staff led group instruction and group discussion with PowerPoint presentation and patient guidebook. To enhance the learning environment the use of posters, models and videos may be added. At  the conclusion of this workshop, patients will understand the importance of their sensorimotor skills  (vision, proprioception, and the vestibular system) in maintaining their ability to balance as they age. Patients will apply a variety of balancing exercises that are appropriate for their current level of function. Patients will understand the common causes for poor balance, possible solutions to these problems, and ways to modify their physical environment in order to minimize their fall risk. The purpose of this lesson is to teach patients about the importance of maintaining balance as they age and ways to minimize their risk of falling.  WORKSHOPS   Nutrition:  Fueling a Ship Broker led group instruction and group discussion with PowerPoint presentation and patient guidebook. To enhance the learning environment the use of posters, models and videos may be added. Patients will review the foundational principles of the Pritikin Eating Plan and understand what constitutes a serving size in each of the food groups. Patients will also learn Pritikin-friendly foods that are better choices when away from home and review make-ahead meal and snack options. Calorie density will be reviewed and applied to three nutrition priorities: weight maintenance, weight loss, and weight gain. The purpose of this lesson is to reinforce (in a group setting) the key concepts around what patients are recommended to eat and how to apply these guidelines when away from home by planning and selecting Pritikin-friendly options. Patients will understand how calorie density may be adjusted for different weight management goals.  Mindful Eating  Clinical staff led group instruction and group discussion with PowerPoint presentation and patient guidebook. To enhance the learning environment the use of posters, models and videos may be added. Patients will briefly review the concepts of the Pritikin Eating Plan and the importance of low-calorie dense foods. The concept of mindful eating will be introduced as well as the  importance of paying attention to internal hunger signals. Triggers for non-hunger eating and techniques for dealing with triggers will be explored. The purpose of this lesson is to provide patients with the opportunity to review the basic principles of the Pritikin Eating Plan, discuss the value of eating mindfully and how to measure internal cues of hunger and fullness using the Hunger Scale. Patients will also discuss reasons for non-hunger eating and learn strategies to use for controlling emotional eating.  Targeting Your Nutrition Priorities Clinical staff led group instruction and group discussion with PowerPoint presentation and patient guidebook. To enhance the learning environment the use of posters, models and videos may be added. Patients will learn how to determine their genetic susceptibility to disease by reviewing their family history. Patients will gain insight into the importance of diet as part of an overall healthy lifestyle in mitigating the impact of genetics and other environmental insults. The purpose of this lesson is to provide patients with the opportunity to assess their personal nutrition priorities by looking at their family history, their own health history and current risk factors. Patients will also be able to discuss ways of prioritizing and modifying the Pritikin Eating Plan for their highest risk areas  Menu  Clinical staff led group instruction and group discussion with PowerPoint presentation and patient guidebook. To enhance the learning environment the use of posters, models and videos may be added. Using menus brought in from [employer], or printed from [employer], patients will apply the Pritikin dining out guidelines that were presented in the [employer] video. Patients will also be able to practice these guidelines in  a variety of provided scenarios. The purpose of this lesson is to provide patients with the opportunity to practice  hands-on learning of the Pritikin Dining Out guidelines with actual menus and practice scenarios.  Label Reading Clinical staff led group instruction and group discussion with PowerPoint presentation and patient guidebook. To enhance the learning environment the use of posters, models and videos may be added. Patients will review and discuss the Pritikin label reading guidelines presented in Pritikin's Label Reading Educational series video. Using fool labels brought in from local grocery stores and markets, patients will apply the label reading guidelines and determine if the packaged food meet the Pritikin guidelines. The purpose of this lesson is to provide patients with the opportunity to review, discuss, and practice hands-on learning of the Pritikin Label Reading guidelines with actual packaged food labels. Cooking School  Pritikin's [employer] are designed to teach patients ways to prepare quick, simple, and affordable recipes at home. The importance of nutrition's role in chronic disease risk reduction is reflected in its emphasis in the overall Pritikin program. By learning how to prepare essential core Pritikin Eating Plan recipes, patients will increase control over what they eat; be able to customize the flavor of foods without the use of added salt, sugar, or fat; and improve the quality of the food they consume. By learning a set of core recipes which are easily assembled, quickly prepared, and affordable, patients are more likely to prepare more healthy foods at home. These workshops focus on convenient breakfasts, simple entres, side dishes, and desserts which can be prepared with minimal effort and are consistent with nutrition recommendations for cardiovascular risk reduction. Cooking [employer] are taught by a armed forces logistics/support/administrative officer (RD) who has been trained by the [employer]. The chef or RD has a clear understanding of the importance of minimizing -  if not completely eliminating - added fat, sugar, and sodium in recipes. Throughout the series of Cooking School Workshop sessions, patients will learn about healthy ingredients and efficient methods of cooking to build confidence in their capability to prepare    Cooking School weekly topics:  Adding Flavor- Sodium-Free  Fast and Healthy Breakfasts  Powerhouse Plant-Based Proteins  Satisfying Salads and Dressings  Simple Sides and Sauces  International Cuisine-Spotlight on the [employer] Zones  Delicious Desserts  Savory Soups  [employer] - Meals in a Astronomer Appetizers and Snacks  Comforting Weekend Breakfasts  One-Pot Wonders   Fast Evening Meals  Landscape Architect Your Pritikin Plate  WORKSHOPS   Healthy Mindset (Psychosocial):  Focused Goals, Sustainable Changes Clinical staff led group instruction and group discussion with PowerPoint presentation and patient guidebook. To enhance the learning environment the use of posters, models and videos may be added. Patients will be able to apply effective goal setting strategies to establish at least one personal goal, and then take consistent, meaningful action toward that goal. They will learn to identify common barriers to achieving personal goals and develop strategies to overcome them. Patients will also gain an understanding of how our mind-set can impact our ability to achieve goals and the importance of cultivating a positive and growth-oriented mind-set. The purpose of this lesson is to provide patients with a deeper understanding of how to set and achieve personal goals, as well as the tools and strategies needed to overcome common obstacles which may arise along the way.  From Head to Heart: The Power of a Psychologist, Occupational  led group instruction and group discussion with PowerPoint presentation and patient guidebook. To enhance the learning environment the use of posters, models and videos may be  added. Patients will be able to recognize and describe the impact of emotions and mood on physical health. They will discover the importance of self-care and explore self-care practices which may work for them. Patients will also learn how to utilize the 4 C's to cultivate a healthier outlook and better manage stress and challenges. The purpose of this lesson is to demonstrate to patients how a healthy outlook is an essential part of maintaining good health, especially as they continue their cardiac rehab journey.  Healthy Sleep for a Healthy Heart Clinical staff led group instruction and group discussion with PowerPoint presentation and patient guidebook. To enhance the learning environment the use of posters, models and videos may be added. At the conclusion of this workshop, patients will be able to demonstrate knowledge of the importance of sleep to overall health, well-being, and quality of life. They will understand the symptoms of, and treatments for, common sleep disorders. Patients will also be able to identify daytime and nighttime behaviors which impact sleep, and they will be able to apply these tools to help manage sleep-related challenges. The purpose of this lesson is to provide patients with a general overview of sleep and outline the importance of quality sleep. Patients will learn about a few of the most common sleep disorders. Patients will also be introduced to the concept of "sleep hygiene," and discover ways to self-manage certain sleeping problems through simple daily behavior changes. Finally, the workshop will motivate patients by clarifying the links between quality sleep and their goals of heart-healthy living.   Recognizing and Reducing Stress Clinical staff led group instruction and group discussion with PowerPoint presentation and patient guidebook. To enhance the learning environment the use of posters, models and videos may be added. At the conclusion of this workshop, patients  will be able to understand the types of stress reactions, differentiate between acute and chronic stress, and recognize the impact that chronic stress has on their health. They will also be able to apply different coping mechanisms, such as reframing negative self-talk. Patients will have the opportunity to practice a variety of stress management techniques, such as deep abdominal breathing, progressive muscle relaxation, and/or guided imagery.  The purpose of this lesson is to educate patients on the role of stress in their lives and to provide healthy techniques for coping with it.  Learning Barriers/Preferences:  Learning Barriers/Preferences - 03/17/23 1224       Learning Barriers/Preferences   Learning Barriers Hearing;Sight   wears glasses and bolateral hearing aids   Learning Preferences Computer/Internet;Skilled Demonstration;Written Material;Individual Instruction;Group Instruction             Education Topics:  Knowledge Questionnaire Score:  Knowledge Questionnaire Score - 03/17/23 1225       Knowledge Questionnaire Score   Pre Score 24/24             Core Components/Risk Factors/Patient Goals at Admission:  Personal Goals and Risk Factors at Admission - 03/17/23 1119       Core Components/Risk Factors/Patient Goals on Admission    Weight Management Weight Maintenance    Diabetes Yes    Intervention Provide education about signs/symptoms and action to take for hypo/hyperglycemia.;Provide education about proper nutrition, including hydration, and aerobic/resistive exercise prescription along with prescribed medications to achieve blood glucose in normal ranges: Fasting glucose 65-99 mg/dL  Expected Outcomes Short Term: Participant verbalizes understanding of the signs/symptoms and immediate care of hyper/hypoglycemia, proper foot care and importance of medication, aerobic/resistive exercise and nutrition plan for blood glucose control.;Long Term: Attainment of HbA1C  < 7%.    Hypertension Yes    Intervention Provide education on lifestyle modifcations including regular physical activity/exercise, weight management, moderate sodium restriction and increased consumption of fresh fruit, vegetables, and low fat dairy, alcohol moderation, and smoking cessation.;Monitor prescription use compliance.    Expected Outcomes Short Term: Continued assessment and intervention until BP is < 140/43mm HG in hypertensive participants. < 130/32mm HG in hypertensive participants with diabetes, heart failure or chronic kidney disease.;Long Term: Maintenance of blood pressure at goal levels.    Lipids Yes    Intervention Provide education and support for participant on nutrition & aerobic/resistive exercise along with prescribed medications to achieve LDL 70mg , HDL >40mg .    Expected Outcomes Short Term: Participant states understanding of desired cholesterol values and is compliant with medications prescribed. Participant is following exercise prescription and nutrition guidelines.;Long Term: Cholesterol controlled with medications as prescribed, with individualized exercise RX and with personalized nutrition plan. Value goals: LDL < 70mg , HDL > 40 mg.             Core Components/Risk Factors/Patient Goals Review:    Core Components/Risk Factors/Patient Goals at Discharge (Final Review):    ITP Comments:  ITP Comments     Row Name 03/17/23 1051           ITP Comments Wilbert Bihari, MD: Medical Director.  Introduction to the Pritikin Education Program/Intensive Cardiac Rehab.  Initial orientation packet reviewed with the patient.                Comments: Pt started cardiac rehab today.  Pt tolerated light exercise without difficulty. VSS, telemetry-Sinus Rhythm, asymptomatic.  Medication list reconciled. Pt denies barriers to medicaiton compliance.  PSYCHOSOCIAL ASSESSMENT:  PHQ-0. Pt exhibits positive coping skills, hopeful outlook with supportive family. No  psychosocial needs identified at this time, no psychosocial interventions necessary.    Pt enjoys yard work, fishing, reading and hiking.   Pt oriented to exercise equipment and routine.    Understanding verbalized. Hadassah Elpidio Quan RN BSN

## 2023-03-27 ENCOUNTER — Encounter (HOSPITAL_COMMUNITY): Payer: Medicare Other

## 2023-03-27 ENCOUNTER — Telehealth (HOSPITAL_COMMUNITY): Payer: Self-pay | Admitting: *Deleted

## 2023-03-27 NOTE — Telephone Encounter (Signed)
 Message left on departmental voicemail for Cardiac rehab.  Pt will be out today - "stomach bug". [facility] staff notified. Alanson Aly, BSN Cardiac and Emergency planning/management officer

## 2023-03-30 ENCOUNTER — Encounter (HOSPITAL_COMMUNITY)
Admission: RE | Admit: 2023-03-30 | Discharge: 2023-03-30 | Disposition: A | Discharge status: Home / Self Care | Payer: Medicare Other | Source: Ambulatory Visit | Attending: Cardiology

## 2023-03-30 DIAGNOSIS — Z955 Presence of coronary angioplasty implant and graft: Secondary | ICD-10-CM

## 2023-03-30 DIAGNOSIS — Z48812 Encounter for surgical aftercare following surgery on the circulatory system: Secondary | ICD-10-CM | POA: Diagnosis not present

## 2023-04-01 ENCOUNTER — Encounter (HOSPITAL_COMMUNITY)
Admission: RE | Admit: 2023-04-01 | Discharge: 2023-04-01 | Disposition: A | Discharge status: Home / Self Care | Payer: Medicare Other | Source: Ambulatory Visit | Attending: Cardiology

## 2023-04-01 DIAGNOSIS — Z955 Presence of coronary angioplasty implant and graft: Secondary | ICD-10-CM

## 2023-04-01 DIAGNOSIS — Z48812 Encounter for surgical aftercare following surgery on the circulatory system: Secondary | ICD-10-CM | POA: Diagnosis not present

## 2023-04-01 LAB — GLUCOSE, CAPILLARY: Glucose-Capillary: 140 mg/dL — ABNORMAL HIGH (ref 70–99)

## 2023-04-03 ENCOUNTER — Encounter (HOSPITAL_COMMUNITY)
Admission: RE | Admit: 2023-04-03 | Discharge: 2023-04-03 | Disposition: A | Discharge status: Home / Self Care | Payer: Medicare Other | Source: Ambulatory Visit | Attending: Cardiology

## 2023-04-03 DIAGNOSIS — Z48812 Encounter for surgical aftercare following surgery on the circulatory system: Secondary | ICD-10-CM | POA: Diagnosis not present

## 2023-04-03 DIAGNOSIS — Z955 Presence of coronary angioplasty implant and graft: Secondary | ICD-10-CM | POA: Diagnosis not present

## 2023-04-06 ENCOUNTER — Encounter (HOSPITAL_COMMUNITY)
Admission: RE | Admit: 2023-04-06 | Discharge: 2023-04-06 | Disposition: A | Discharge status: Home / Self Care | Payer: Medicare Other | Source: Ambulatory Visit | Attending: Cardiology | Admitting: Cardiology

## 2023-04-06 DIAGNOSIS — Z955 Presence of coronary angioplasty implant and graft: Secondary | ICD-10-CM

## 2023-04-06 DIAGNOSIS — Z48812 Encounter for surgical aftercare following surgery on the circulatory system: Secondary | ICD-10-CM | POA: Diagnosis not present

## 2023-04-08 ENCOUNTER — Encounter (HOSPITAL_COMMUNITY)
Admission: RE | Admit: 2023-04-08 | Discharge: 2023-04-08 | Disposition: A | Discharge status: Home / Self Care | Payer: Medicare Other | Source: Ambulatory Visit | Attending: Cardiology | Admitting: Cardiology

## 2023-04-08 DIAGNOSIS — Z955 Presence of coronary angioplasty implant and graft: Secondary | ICD-10-CM | POA: Diagnosis not present

## 2023-04-08 DIAGNOSIS — Z48812 Encounter for surgical aftercare following surgery on the circulatory system: Secondary | ICD-10-CM | POA: Diagnosis not present

## 2023-04-10 ENCOUNTER — Encounter (HOSPITAL_COMMUNITY)
Admission: RE | Admit: 2023-04-10 | Discharge: 2023-04-10 | Disposition: A | Discharge status: Home / Self Care | Payer: Medicare Other | Source: Ambulatory Visit | Attending: Cardiology | Admitting: Cardiology

## 2023-04-10 DIAGNOSIS — Z955 Presence of coronary angioplasty implant and graft: Secondary | ICD-10-CM

## 2023-04-10 DIAGNOSIS — Z48812 Encounter for surgical aftercare following surgery on the circulatory system: Secondary | ICD-10-CM | POA: Diagnosis not present

## 2023-04-13 ENCOUNTER — Encounter (HOSPITAL_COMMUNITY)
Admission: RE | Admit: 2023-04-13 | Discharge: 2023-04-13 | Disposition: A | Discharge status: Home / Self Care | Payer: Medicare Other | Source: Ambulatory Visit | Attending: Cardiology

## 2023-04-13 DIAGNOSIS — Z955 Presence of coronary angioplasty implant and graft: Secondary | ICD-10-CM | POA: Diagnosis not present

## 2023-04-13 DIAGNOSIS — Z48812 Encounter for surgical aftercare following surgery on the circulatory system: Secondary | ICD-10-CM | POA: Diagnosis not present

## 2023-04-15 ENCOUNTER — Encounter (HOSPITAL_COMMUNITY)
Admission: RE | Admit: 2023-04-15 | Discharge: 2023-04-15 | Disposition: A | Discharge status: Home / Self Care | Payer: Medicare Other | Source: Ambulatory Visit | Attending: Cardiology | Admitting: Cardiology

## 2023-04-15 DIAGNOSIS — Z955 Presence of coronary angioplasty implant and graft: Secondary | ICD-10-CM | POA: Diagnosis not present

## 2023-04-15 DIAGNOSIS — Z48812 Encounter for surgical aftercare following surgery on the circulatory system: Secondary | ICD-10-CM | POA: Diagnosis not present

## 2023-04-16 NOTE — Progress Notes (Signed)
Cardiac Individual Treatment Plan  Patient Details  Name: Steven Lawrence MRN: [id number] Date of Birth: Mar 28, 1950 Referring Provider:   Flowsheet Row INTENSIVE CARDIAC REHAB ORIENT from 03/17/2023 in [facility]  Referring Provider Olga Millers, MD       Initial Encounter Date:  Flowsheet Row INTENSIVE CARDIAC REHAB ORIENT from 03/17/2023 in [facility]  Date 03/17/23       Visit Diagnosis: 01/29/23 Status post coronary artery stent placement RCA x 3  Patient's Home Medications on Admission:  Current Outpatient Medications:    ACCU-CHEK GUIDE test strip, 1 each by Other route daily in the afternoon., Disp: , Rfl:    acetaminophen (TYLENOL) 650 MG CR tablet, Take 1,300 mg by mouth 2 (two) times daily., Disp: , Rfl:    aspirin EC 81 MG EC tablet, Take 1 tablet (81 mg total) by mouth daily. Swallow whole., Disp: 30 tablet, Rfl: 11   atorvastatin (LIPITOR) 80 MG tablet, Take 80 mg by mouth daily. , Disp: , Rfl:    BD PEN NEEDLE NANO 2ND GEN 32G X 4 MM MISC, USE AS DIRECTED ONCE A DAY, Disp: , Rfl:    Blood Glucose Monitoring Suppl (ACCU-CHEK AVIVA PLUS) w/Device KIT, daily in the afternoon., Disp: , Rfl:    canagliflozin (INVOKANA) 300 MG TABS tablet, Take 300 mg by mouth daily before breakfast., Disp: , Rfl:    cholestyramine light 4 g POWD, Take 4 g by mouth daily. 1 Scoop, Disp: , Rfl:    clopidogrel (PLAVIX) 75 MG tablet, Take 1 tablet (75 mg total) by mouth daily., Disp: 30 tablet, Rfl: 11   doxazosin (CARDURA) 2 MG tablet, Take 1 tablet (2 mg total) by mouth daily., Disp: 30 tablet, Rfl: 6   insulin degludec (TRESIBA) 100 UNIT/ML FlexTouch Pen, Inject 20 Units into the skin at bedtime., Disp: , Rfl:    levothyroxine (SYNTHROID) 125 MCG tablet, Take 125 mcg by mouth every morning., Disp: , Rfl:    metFORMIN (GLUCOPHAGE) 1000 MG tablet, Take 1,000 mg by mouth 2  (two) times daily with a meal., Disp: , Rfl:    nitroGLYCERIN (NITROSTAT) 0.4 MG SL tablet, Place 1 tablet (0.4 mg total) under the tongue every 5 (five) minutes as needed for chest pain., Disp: 25 tablet, Rfl: 3   pramipexole (MIRAPEX) 0.125 MG tablet, Take 0.5 mg by mouth at bedtime., Disp: , Rfl:    ranolazine (RANEXA) 500 MG 12 hr tablet, Take 1 tablet (500 mg total) by mouth 2 (two) times daily., Disp: 180 tablet, Rfl: 2  Past Medical History: Past Medical History:  Diagnosis Date   Acid reflux    Arthritis    BPH (benign prostatic hyperplasia)    mild   CAD in native artery    a. NSTEMI 12/2019 s/p CABGx5 with bovine AVR.   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, RDI 10.2/hr ([name])   Urinary frequency    Wears glasses    Wears hearing aid    both  ears    Tobacco Use: Social History   Tobacco Use  Smoking Status Former   Current packs/day: 0.00   Average packs/day: 1 pack/day for 53.0 years (53.0 ttl pk-yrs)   Types: Cigarettes   Start date: 12/16/1966   Quit date: 12/16/2019   Years since quitting: 3.3  Smokeless Tobacco Never    Labs: Review Flowsheet  More data exists      Latest Ref Rng & Units 12/21/2019 04/10/2020 09/05/2020 01/29/2023 01/30/2023  Labs for ITP Cardiac and Pulmonary Rehab  Cholestrol 0 - 200 mg/dL - 098  119  - 89   LDL (calc) 0 - 99 mg/dL - 75  48  - 41   HDL-C >40 mg/dL - 47  55  - 37   Trlycerides <150 mg/dL - 63  48  - 54   Hemoglobin A1c 4.8 - 5.6 % - - - 7.0  -  PH, Arterial 7.350 - 7.450 7.357  7.404  7.377  7.438  7.437  7.338  7.347  7.411  - - - -  PCO2 arterial 32.0 - 48.0 mmHg 37.2  33.2   37.9  37.1  37.3  47.3  47.8  37.3  - - - -  Bicarbonate 20.0 - 28.0 mmol/L 20.8  20.8  22.6  25.1  25.1  25.4  26.2  23.2  - - - -  TCO2 22 - 32 mmol/L 22  22  24  23  26  26  26  27  27  28  24  25   - - - -  Acid-base deficit 0.0 - 2.0 mmol/L 4.0  3.0  3.0  1.0  0.8  - - - -  O2 Saturation % 97.0  99.0  99.0  100.0  100.0  89.0  100.0  97.9  - - - -    Details       Multiple values from one day are sorted in reverse-chronological order         Capillary Blood Glucose: Lab Results  Component Value Date   GLUCAP 140 (H) 04/01/2023   GLUCAP 114 (H) 03/25/2023   GLUCAP 114 (H) 03/25/2023   GLUCAP 88 01/30/2023   GLUCAP 143 (H) 01/29/2023     Exercise Target Goals: Exercise Program Goal: Individual exercise prescription set using results from initial 6 min walk test and THRR while considering  patient's activity barriers and safety.   Exercise Prescription Goal: Initial exercise prescription builds to 30-45 minutes a day of aerobic activity, 2-3 days per week.  Home exercise guidelines will be given to patient during program as part of exercise prescription that the participant will acknowledge.  Activity Barriers & Risk Stratification:  Activity Barriers & Cardiac Risk Stratification - 03/17/23 1220       Activity Barriers & Cardiac Risk Stratification   Activity Barriers Arthritis;Back Problems;Joint Problems;Deconditioning;Balance Concerns    Cardiac Risk Stratification High             6 Minute Walk:  6 Minute Walk     Row Name 03/17/23 1219         6 Minute Walk   Phase Initial     Distance 1833 feet     Walk Time 6 minutes     # of Rest Breaks 0     MPH 3.47     METS 4.4     RPE 13     Perceived Dyspnea  0     VO2 Peak 15.3  Symptoms No     Resting HR 69 bpm     Resting BP 114/60     Resting Oxygen Saturation  97 %     Exercise Oxygen Saturation  during 6 min walk 99 %     Max Ex. HR 119 bpm     Max Ex. BP 152/68     2 Minute Post BP  130/70              Oxygen Initial Assessment:   Oxygen Re-Evaluation:   Oxygen Discharge (Final Oxygen Re-Evaluation):   Initial Exercise Prescription:  Initial Exercise Prescription - 03/17/23 1200       Date of Initial Exercise RX and Referring Provider   Date 03/17/23    Referring Provider Olga Millers, MD    Expected Discharge Date 06/10/23      Treadmill   MPH 3    Grade 1    Minutes 15    METs 3.71      Recumbant Elliptical   Level 2    RPM 60    Watts 50    Minutes 15    METs 4.1      Prescription Details   Frequency (times per week) 3    Duration Progress to 30 minutes of continuous aerobic without signs/symptoms of physical distress      Intensity   THRR 40-80% of Max Heartrate 59-118    Ratings of Perceived Exertion 11-13    Perceived Dyspnea 0-4      Progression   Progression Continue progressive overload as per policy without signs/symptoms or physical distress.      Resistance Training   Training Prescription Yes    Weight 4 lbs    Reps 10-15             Perform Capillary Blood Glucose checks as needed.  Exercise Prescription Changes:   Exercise Prescription Changes     Row Name 03/25/23 1400 04/08/23 1015           Response to Exercise   Blood Pressure (Admit) 122/60 124/62      Blood Pressure (Exercise) 142/70 138/70      Blood Pressure (Exit) 110/80 110/58      Heart Rate (Admit) 73 bpm 70 bpm      Heart Rate (Exercise) 122 bpm 125 bpm      Heart Rate (Exit) 79 bpm 79 bpm      Rating of Perceived Exertion (Exercise) 12 12      Symptoms None None      Comments Pt's first day in the CRP2 program Reviewed METs      Duration Continue with 30 min of aerobic exercise without signs/symptoms of physical distress. Continue with 30 min of aerobic exercise without signs/symptoms of physical distress.      Intensity THRR unchanged THRR unchanged        Progression   Progression Continue to progress workloads to maintain  intensity without signs/symptoms of physical distress. Continue to progress workloads to maintain intensity without signs/symptoms of physical distress.      Average METs 3.7 3.6        Resistance Training   Training Prescription No No      Weight No weights on wednesdays No weights on wednesdays        Interval Training   Interval Training No No        Treadmill   MPH 3 --      Grade 1 --  Minutes 15 --      METs 3.71 --        Arm Ergometer   Level -- 1.5      Minutes -- 15      METs -- 2.4        Recumbant Elliptical   Level 2 2      RPM 59 60      Watts 79 82      Minutes 15 15      METs 3.7 4.8               Exercise Comments:   Exercise Comments     Row Name 03/25/23 1409 04/08/23 1015         Exercise Comments Pt's frist day in hte CRP2 program. Pt exercised without complaints at 3.7 METs. Pt is off to a great start. Reviewed METs. Pt has been using the arm ergometer the last several sessions due to twisiting his ankle at home and not feeling comfortable on the treadmill. Pt wants to return to treadmill in another week. Pt is progressing.               Exercise Goals and Review:   Exercise Goals     Row Name 03/17/23 1115             Exercise Goals   Increase Physical Activity Yes       Intervention Provide advice, education, support and counseling about physical activity/exercise needs.;Develop an individualized exercise prescription for aerobic and resistive training based on initial evaluation findings, risk stratification, comorbidities and participant's personal goals.       Expected Outcomes Short Term: Attend rehab on a regular basis to increase amount of physical activity.;Long Term: Exercising regularly at least 3-5 days a week.;Long Term: Add in home exercise to make exercise part of routine and to increase amount of physical activity.       Increase Strength and Stamina Yes       Intervention Provide advice, education, support and  counseling about physical activity/exercise needs.;Develop an individualized exercise prescription for aerobic and resistive training based on initial evaluation findings, risk stratification, comorbidities and participant's personal goals.       Expected Outcomes Short Term: Increase workloads from initial exercise prescription for resistance, speed, and METs.;Short Term: Perform resistance training exercises routinely during rehab and add in resistance training at home;Long Term: Improve cardiorespiratory fitness, muscular endurance and strength as measured by increased METs and functional capacity ( )       Able to understand and use rate of perceived exertion (RPE) scale Yes       Intervention Provide education and explanation on how to use RPE scale       Expected Outcomes Short Term: Able to use RPE daily in rehab to express subjective intensity level;Long Term:  Able to use RPE to guide intensity level when exercising independently       Knowledge and understanding of Target Heart Rate Range (THRR) Yes       Intervention Provide education and explanation of THRR including how the numbers were predicted and where they are located for reference       Expected Outcomes Short Term: Able to state/look up THRR;Long Term: Able to use THRR to govern intensity when exercising independently;Short Term: Able to use daily as guideline for intensity in rehab       Understanding of Exercise Prescription Yes       Intervention Provide education, explanation, and written materials  on patient's individual exercise prescription       Expected Outcomes Short Term: Able to explain program exercise prescription;Long Term: Able to explain home exercise prescription to exercise independently                Exercise Goals Re-Evaluation :  Exercise Goals Re-Evaluation     Row Name 03/25/23 1408             Exercise Goal Re-Evaluation   Exercise Goals Review Increase Physical Activity;Understanding of  Exercise Prescription;Increase Strength and Stamina;Knowledge and understanding of Target Heart Rate Range (THRR);Able to understand and use rate of perceived exertion (RPE) scale       Comments Pt's first day in the CRP2 program. Pt understands the exercise Rx, RPE scale and THRR.       Expected Outcomes Will continue to montior patient and progress exercise workloads as tolerated.                Discharge Exercise Prescription (Final Exercise Prescription Changes):  Exercise Prescription Changes - 04/08/23 1015       Response to Exercise   Blood Pressure (Admit) 124/62    Blood Pressure (Exercise) 138/70    Blood Pressure (Exit) 110/58    Heart Rate (Admit) 70 bpm    Heart Rate (Exercise) 125 bpm    Heart Rate (Exit) 79 bpm    Rating of Perceived Exertion (Exercise) 12    Symptoms None    Comments Reviewed METs    Duration Continue with 30 min of aerobic exercise without signs/symptoms of physical distress.    Intensity THRR unchanged      Progression   Progression Continue to progress workloads to maintain intensity without signs/symptoms of physical distress.    Average METs 3.6      Resistance Training   Training Prescription No    Weight No weights on wednesdays      Interval Training   Interval Training No      Arm Ergometer   Level 1.5    Minutes 15    METs 2.4      Recumbant Elliptical   Level 2    RPM 60    Watts 82    Minutes 15    METs 4.8             Nutrition:  Target Goals: Understanding of nutrition guidelines, daily intake of sodium 1500mg , cholesterol 200mg , calories 30% from fat and 7% or less from saturated fats, daily to have 5 or more servings of fruits and vegetables.  Biometrics:  Pre Biometrics - 03/17/23 1052       Pre Biometrics   Waist Circumference 38 inches    Hip Circumference 38.5 inches    Waist to Hip Ratio 0.99 %    Triceps Skinfold 8 mm    % Body Fat 22.7 %    Grip Strength 32 kg    Flexibility 15.25 in     Single Leg Stand 6.62 seconds              Nutrition Therapy Plan and Nutrition Goals:   Nutrition Assessments:  Nutrition Assessments - 03/25/23 1402       Rate Your Plate Scores   Pre Score 78            MEDIFICTS Score Key: >=70 Need to make dietary changes  40-70 Heart Healthy Diet <= 40 Therapeutic Level Cholesterol Diet   Flowsheet Row INTENSIVE CARDIAC REHAB from 03/25/2023 in [facility]  Center [facility]  Picture Your Plate Total Score on Admission 78      Picture Your Plate Scores: <40 Unhealthy dietary pattern with much room for improvement. 41-50 Dietary pattern unlikely to meet recommendations for good health and room for improvement. 51-60 More healthful dietary pattern, with some room for improvement.  >60 Healthy dietary pattern, although there may be some specific behaviors that could be improved.    Nutrition Goals Re-Evaluation:   Nutrition Goals Re-Evaluation:   Nutrition Goals Discharge (Final Nutrition Goals Re-Evaluation):   Psychosocial: Target Goals: Acknowledge presence or absence of significant depression and/or stress, maximize coping skills, provide positive support system. Participant is able to verbalize types and ability to use techniques and skills needed for reducing stress and depression.  Initial Review & Psychosocial Screening:  Initial Psych Review & Screening - 03/17/23 1117       Initial Review   Current issues with None Identified      Family Dynamics   Good Support System? Yes   Jerrol has his spouse for support     Barriers   Psychosocial barriers to participate in program There are no identifiable barriers or psychosocial needs.      Screening Interventions   Interventions Encouraged to exercise             Quality of Life Scores:  Quality of Life - 03/17/23 1223       Quality of Life   Select Quality of Life      Quality of Life Scores   Health/Function  Pre 27.4 %    Socioeconomic Pre 29.29 %    Psych/Spiritual Pre 27.86 %    Family Pre 28.8 %    GLOBAL Pre 28.09 %            Scores of 19 and below usually indicate a poorer quality of life in these areas.  A difference of  2-3 points is a clinically meaningful difference.  A difference of 2-3 points in the total score of the Quality of Life Index has been associated with significant improvement in overall quality of life, self-image, physical symptoms, and general health in studies assessing change in quality of life.  PHQ-9: Review Flowsheet  More data may exist      03/17/2023 07/23/2022 05/14/2022 05/10/2020 03/13/2020  Depression screen PHQ 2/9  Decreased Interest 0 0 0 0 0  Down, Depressed, Hopeless 0 0 1 0 0  PHQ - 2 Score 0 0 1 0 0  Altered sleeping 0 0 0 - -  Tired, decreased energy 0 0 1 - -  Change in appetite 0 0 0 - -  Feeling bad or failure about yourself  0 0 0 - -  Trouble concentrating 0 0 0 - -  Moving slowly or fidgety/restless 0 0 0 - -  Suicidal thoughts 0 0 0 - -  PHQ-9 Score 0 0 2 - -  Difficult doing work/chores - Not difficult at all Not difficult at all - -   Interpretation of Total Score  Total Score Depression Severity:  1-4 = Minimal depression, 5-9 = Mild depression, 10-14 = Moderate depression, 15-19 = Moderately severe depression, 20-27 = Severe depression   Psychosocial Evaluation and Intervention:   Psychosocial Re-Evaluation:  Psychosocial Re-Evaluation     Row Name 03/25/23 1413 04/16/23 1004           Psychosocial Re-Evaluation   Current issues with None Identified None Identified  Interventions Encouraged to attend Cardiac Rehabilitation for the exercise Encouraged to attend Cardiac Rehabilitation for the exercise      Continue Psychosocial Services  No Follow up required No Follow up required               Psychosocial Discharge (Final Psychosocial Re-Evaluation):  Psychosocial Re-Evaluation - 04/16/23 1004        Psychosocial Re-Evaluation   Current issues with None Identified    Interventions Encouraged to attend Cardiac Rehabilitation for the exercise    Continue Psychosocial Services  No Follow up required             Vocational Rehabilitation: Provide vocational rehab assistance to qualifying candidates.   Vocational Rehab Evaluation & Intervention:  Vocational Rehab - 03/17/23 1118       Initial Vocational Rehab Evaluation & Intervention   Assessment shows need for Vocational Rehabilitation No   Danis is retired            Education: Education Goals: Education classes will be provided on a weekly basis, covering required topics. Participant will state understanding/return demonstration of topics presented.    Education     Row Name 03/25/23 1100     Education   Cardiac Education Topics Pritikin   Secondary school teacher School   Educator Nurse;Respiratory Therapist   Weekly Topic Comforting Weekend Breakfasts   Instruction Review Code 1- Verbalizes Understanding   Class Start Time 0818   Class Stop Time [phone]   Class Time Calculation (min) 40 min    Row Name 03/30/23 0800     Education   Cardiac Education Topics Pritikin   Select Core Videos     Core Videos   Educator Exercise Physiologist   Select Exercise Education   Exercise Education Improving Performance   Instruction Review Code 1- Verbalizes Understanding   Class Start Time [phone]   Class Stop Time 0850   Class Time Calculation (min) 38 min    Row Name 04/01/23 1300     Education   Cardiac Education Topics Pritikin   Secondary school teacher School   Educator Nurse;Respiratory Therapist   Weekly Topic Fast Evening Meals   Instruction Review Code 1- Verbalizes Understanding   Class Start Time [phone]   Class Stop Time 8413   Class Time Calculation (min) 36 min    Row Name 04/03/23 0900     Education   Cardiac Education Topics Pritikin   Select Workshops     Workshops    Educator Dietitian   Select Nutrition   Nutrition Workshop Fueling a Forensic psychologist   Instruction Review Code 1- Verbalizes Understanding   Class Start Time 0815   Class Stop Time 0850   Class Time Calculation (min) 35 min    Row Name 04/06/23 1100     Education   Cardiac Education Topics Pritikin   Select Workshops     Workshops   Educator Exercise Physiologist   Select Psychosocial   Psychosocial Workshop Healthy Sleep for a Healthy Heart   Instruction Review Code 1- Verbalizes Understanding   Class Start Time [phone]   Class Stop Time 0857   Class Time Calculation (min) 43 min    Row Name 04/08/23 1200     Education   Cardiac Education Topics Pritikin   Designer, jewellery   Weekly Topic International Cuisine- Spotlight on the [place] Zones  Instruction Review Code 1- Verbalizes Understanding   Class Start Time 0815   Class Stop Time 0851   Class Time Calculation (min) 36 min    Row Name 04/10/23 1300     Education   Cardiac Education Topics Pritikin   Select Core Videos     Core Videos   Educator Exercise Physiologist   Select Psychosocial   Psychosocial How Our Thoughts Can Heal Our Hearts   Instruction Review Code 1- Verbalizes Understanding   Class Start Time [phone]   Class Stop Time 0846   Class Time Calculation (min) 32 min    Row Name 04/13/23 0800     Education   Cardiac Education Topics Pritikin   Select Workshops     Workshops   Educator Nurse   Select Exercise   Exercise Workshop Managing Heart Disease: Your Path to a Healthier Heart   Instruction Review Code 1- Verbalizes Understanding   Class Start Time [phone]   Class Stop Time 0846   Class Time Calculation (min) 35 min    Row Name 04/15/23 1000     Education   Cardiac Education Topics Pritikin   Secondary school teacher School   Educator Dietitian;Respiratory Therapist   Weekly Topic Simple Sides and Sauces    Instruction Review Code 1- Verbalizes Understanding   Class Start Time 0815   Class Stop Time 0847   Class Time Calculation (min) 32 min    Row Name 04/17/23 0800     Education   Cardiac Education Topics Pritikin   Select Core Videos     Core Videos   Educator Exercise Physiologist   Select General Education   General Education Hypertension and Heart Disease   Instruction Review Code 1- Verbalizes Understanding   Class Start Time 0815   Class Stop Time 0849   Class Time Calculation (min) 34 min    Row Name 04/20/23 0800     Education   Select Workshops     Workshops   Educator Exercise Clinical cytogeneticist Psychosocial   Psychosocial Workshop From [employer] to Heart: The Power of a Healthy Outlook   Instruction Review Code 1- Verbalizes Understanding            Core Videos: Exercise    Move It!  Clinical staff conducted group or individual video education with verbal and written material and guidebook.  Patient learns the recommended Pritikin exercise program. Exercise with the goal of living a long, healthy life. Some of the health benefits of exercise include controlled diabetes, healthier blood pressure levels, improved cholesterol levels, improved heart and lung capacity, improved sleep, and better body composition. Everyone should speak with their doctor before starting or changing an exercise routine.  Biomechanical Limitations Clinical staff conducted group or individual video education with verbal and written material and guidebook.  Patient learns how biomechanical limitations can impact exercise and how we can mitigate and possibly overcome limitations to have an impactful and balanced exercise routine.  Body Composition Clinical staff conducted group or individual video education with verbal and written material and guidebook.  Patient learns that body composition (ratio of muscle mass to fat mass) is a key component to assessing overall fitness, rather than  body weight alone. Increased fat mass, especially visceral belly fat, can put [place] at increased risk for metabolic syndrome, type 2 diabetes, heart disease, and even death. It is recommended to combine diet and exercise (cardiovascular and resistance training) to improve your body composition.  Seek guidance from your physician and exercise physiologist before implementing an exercise routine.  Exercise Action Plan Clinical staff conducted group or individual video education with verbal and written material and guidebook.  Patient learns the recommended strategies to achieve and enjoy long-term exercise adherence, including variety, self-motivation, self-efficacy, and positive decision making. Benefits of exercise include fitness, good health, weight management, more energy, better sleep, less stress, and overall well-being.  Medical   Heart Disease Risk Reduction Clinical staff conducted group or individual video education with verbal and written material and guidebook.  Patient learns our heart is our most vital organ as it circulates oxygen, nutrients, white blood cells, and hormones throughout the entire body, and carries waste away. Data supports a plant-based eating plan like the Pritikin Program for its effectiveness in slowing progression of and reversing heart disease. The video provides a number of recommendations to address heart disease.   Metabolic Syndrome and Belly Fat  Clinical staff conducted group or individual video education with verbal and written material and guidebook.  Patient learns what metabolic syndrome is, how it leads to heart disease, and how one can reverse it and keep it from coming back. You have metabolic syndrome if you have 3 of the following 5 criteria: abdominal obesity, high blood pressure, high triglycerides, low HDL cholesterol, and high blood sugar.  Hypertension and Heart Disease Clinical staff conducted group or individual video education with verbal and  written material and guidebook.  Patient learns that high blood pressure, or hypertension, is very common in the [place]. Hypertension is largely due to excessive salt intake, but other important risk factors include being overweight, physical inactivity, drinking too much alcohol, smoking, and not eating enough potassium from fruits and vegetables. High blood pressure is a leading risk factor for heart attack, stroke, congestive heart failure, dementia, kidney failure, and premature death. Long-term effects of excessive salt intake include stiffening of the arteries and thickening of heart muscle and organ damage. Recommendations include ways to reduce hypertension and the risk of heart disease.  Diseases of Our Time - Focusing on Diabetes Clinical staff conducted group or individual video education with verbal and written material and guidebook.  Patient learns why the best way to stop diseases of our time is prevention, through food and other lifestyle changes. Medicine (such as prescription pills and surgeries) is often only a Band-Aid on the problem, not a long-term solution. Most common diseases of our time include obesity, type 2 diabetes, hypertension, heart disease, and cancer. The Pritikin Program is recommended and has been proven to help reduce, reverse, and/or prevent the damaging effects of metabolic syndrome.  Nutrition   Overview of the Pritikin Eating Plan  Clinical staff conducted group or individual video education with verbal and written material and guidebook.  Patient learns about the Pritikin Eating Plan for disease risk reduction. The Pritikin Eating Plan emphasizes a wide variety of unrefined, minimally-processed carbohydrates, like fruits, vegetables, whole grains, and legumes. Go, Caution, and Stop food choices are explained. Plant-based and lean animal proteins are emphasized. Rationale provided for low sodium intake for blood pressure control, low added sugars for blood  sugar stabilization, and low added fats and oils for coronary artery disease risk reduction and weight management.  Calorie Density  Clinical staff conducted group or individual video education with verbal and written material and guidebook.  Patient learns about calorie density and how it impacts the Pritikin Eating Plan. Knowing the characteristics of the food you choose will help you  decide whether those foods will lead to weight gain or weight loss, and whether you want to consume more or less of them. Weight loss is usually a side effect of the Pritikin Eating Plan because of its focus on low calorie-dense foods.  Label Reading  Clinical staff conducted group or individual video education with verbal and written material and guidebook.  Patient learns about the Pritikin recommended label reading guidelines and corresponding recommendations regarding calorie density, added sugars, sodium content, and whole grains.  Dining Out - Part 1  Clinical staff conducted group or individual video education with verbal and written material and guidebook.  Patient learns that restaurant meals can be sabotaging because they can be so high in calories, fat, sodium, and/or sugar. Patient learns recommended strategies on how to positively address this and avoid unhealthy pitfalls.  Facts on Fats  Clinical staff conducted group or individual video education with verbal and written material and guidebook.  Patient learns that lifestyle modifications can be just as effective, if not more so, as many medications for lowering your risk of heart disease. A Pritikin lifestyle can help to reduce your risk of inflammation and atherosclerosis (cholesterol build-up, or plaque, in the artery walls). Lifestyle interventions such as dietary choices and physical activity address the cause of atherosclerosis. A review of the types of fats and their impact on blood cholesterol levels, along with dietary recommendations to reduce  fat intake is also included.  Nutrition Action Plan  Clinical staff conducted group or individual video education with verbal and written material and guidebook.  Patient learns how to incorporate Pritikin recommendations into their lifestyle. Recommendations include planning and keeping personal health goals in mind as an important part of their success.  Healthy Mind-Set    Healthy Minds, Bodies, Hearts  Clinical staff conducted group or individual video education with verbal and written material and guidebook.  Patient learns how to identify when they are stressed. Video will discuss the impact of that stress, as well as the many benefits of stress management. Patient will also be introduced to stress management techniques. The way we think, act, and feel has an impact on our hearts.  How Our Thoughts Can Heal Our Hearts  Clinical staff conducted group or individual video education with verbal and written material and guidebook.  Patient learns that negative thoughts can cause depression and anxiety. This can result in negative lifestyle behavior and serious health problems. Cognitive behavioral therapy is an effective method to help control our thoughts in order to change and improve our emotional outlook.  Additional Videos:  Exercise    Improving Performance  Clinical staff conducted group or individual video education with verbal and written material and guidebook.  Patient learns to use a non-linear approach by alternating intensity levels and lengths of time spent exercising to help burn more calories and lose more body fat. Cardiovascular exercise helps improve heart health, metabolism, hormonal balance, blood sugar control, and recovery from fatigue. Resistance training improves strength, endurance, balance, coordination, reaction time, metabolism, and muscle mass. Flexibility exercise improves circulation, posture, and balance. Seek guidance from your physician and exercise  physiologist before implementing an exercise routine and learn your capabilities and proper form for all exercise.  Introduction to Yoga  Clinical staff conducted group or individual video education with verbal and written material and guidebook.  Patient learns about yoga, a discipline of the coming together of mind, breath, and body. The benefits of yoga include improved flexibility, improved range of motion, better  posture and core strength, increased lung function, weight loss, and positive self-image. Yoga's heart health benefits include lowered blood pressure, healthier heart rate, decreased cholesterol and triglyceride levels, improved immune function, and reduced stress. Seek guidance from your physician and exercise physiologist before implementing an exercise routine and learn your capabilities and proper form for all exercise.  Medical   Aging: Enhancing Your Quality of Life  Clinical staff conducted group or individual video education with verbal and written material and guidebook.  Patient learns key strategies and recommendations to stay in good physical health and enhance quality of life, such as prevention strategies, having an advocate, securing a Health Care Proxy and Power of Attorney, and keeping a list of medications and system for tracking them. It also discusses how to avoid risk for bone loss.  Biology of Weight Control  Clinical staff conducted group or individual video education with verbal and written material and guidebook.  Patient learns that weight gain occurs because we consume more calories than we burn (eating more, moving less). Even if your body weight is normal, you may have higher ratios of fat compared to muscle mass. Too much body fat puts you at increased risk for cardiovascular disease, heart attack, stroke, type 2 diabetes, and obesity-related cancers. In addition to exercise, following the Pritikin Eating Plan can help reduce your risk.  Decoding Lab Results   Clinical staff conducted group or individual video education with verbal and written material and guidebook.  Patient learns that lab test reflects one measurement whose values change over time and are influenced by many factors, including medication, stress, sleep, exercise, food, hydration, pre-existing medical conditions, and more. It is recommended to use the knowledge from this video to become more involved with your lab results and evaluate your numbers to speak with your doctor.   Diseases of Our Time - Overview  Clinical staff conducted group or individual video education with verbal and written material and guidebook.  Patient learns that according to the CDC, 50% to 70% of chronic diseases (such as obesity, type 2 diabetes, elevated lipids, hypertension, and heart disease) are avoidable through lifestyle improvements including healthier food choices, listening to satiety cues, and increased physical activity.  Sleep Disorders Clinical staff conducted group or individual video education with verbal and written material and guidebook.  Patient learns how good quality and duration of sleep are important to overall health and well-being. Patient also learns about sleep disorders and how they impact health along with recommendations to address them, including discussing with a physician.  Nutrition  Dining Out - Part 2 Clinical staff conducted group or individual video education with verbal and written material and guidebook.  Patient learns how to plan ahead and communicate in order to maximize their dining experience in a healthy and nutritious manner. Included are recommended food choices based on the type of restaurant the patient is visiting.   Fueling a Banker conducted group or individual video education with verbal and written material and guidebook.  There is a strong connection between our food choices and our health. Diseases like obesity and type 2 diabetes  are very prevalent and are in large-part due to lifestyle choices. The Pritikin Eating Plan provides plenty of food and hunger-curbing satisfaction. It is easy to follow, affordable, and helps reduce health risks.  Menu Workshop  Clinical staff conducted group or individual video education with verbal and written material and guidebook.  Patient learns that restaurant meals can sabotage health goals  because they are often packed with calories, fat, sodium, and sugar. Recommendations include strategies to plan ahead and to communicate with the manager, chef, or server to help order a healthier meal.  Planning Your Eating Strategy  Clinical staff conducted group or individual video education with verbal and written material and guidebook.  Patient learns about the Pritikin Eating Plan and its benefit of reducing the risk of disease. The Pritikin Eating Plan does not focus on calories. Instead, it emphasizes high-quality, nutrient-rich foods. By knowing the characteristics of the foods, we choose, we can determine their calorie density and make informed decisions.  Targeting Your Nutrition Priorities  Clinical staff conducted group or individual video education with verbal and written material and guidebook.  Patient learns that lifestyle habits have a tremendous impact on disease risk and progression. This video provides eating and physical activity recommendations based on your personal health goals, such as reducing LDL cholesterol, losing weight, preventing or controlling type 2 diabetes, and reducing high blood pressure.  Vitamins and Minerals  Clinical staff conducted group or individual video education with verbal and written material and guidebook.  Patient learns different ways to obtain key vitamins and minerals, including through a recommended healthy diet. It is important to discuss all supplements you take with your doctor.   Healthy Mind-Set    Smoking Cessation  Clinical staff  conducted group or individual video education with verbal and written material and guidebook.  Patient learns that cigarette smoking and tobacco addiction pose a serious health risk which affects millions of people. Stopping smoking will significantly reduce the risk of heart disease, lung disease, and many forms of cancer. Recommended strategies for quitting are covered, including working with your doctor to develop a successful plan.  Culinary   Becoming a Set designer conducted group or individual video education with verbal and written material and guidebook.  Patient learns that cooking at home can be healthy, cost-effective, quick, and puts them in control. Keys to cooking healthy recipes will include looking at your recipe, assessing your equipment needs, planning ahead, making it simple, choosing cost-effective seasonal ingredients, and limiting the use of added fats, salts, and sugars.  Cooking - Breakfast and Snacks  Clinical staff conducted group or individual video education with verbal and written material and guidebook.  Patient learns how important breakfast is to satiety and nutrition through the entire day. Recommendations include key foods to eat during breakfast to help stabilize blood sugar levels and to prevent overeating at meals later in the day. Planning ahead is also a key component.  Cooking - Educational psychologist conducted group or individual video education with verbal and written material and guidebook.  Patient learns eating strategies to improve overall health, including an approach to cook more at home. Recommendations include thinking of animal protein as a side on your plate rather than center stage and focusing instead on lower calorie dense options like vegetables, fruits, whole grains, and plant-based proteins, such as beans. Making sauces in large quantities to freeze for later and leaving the skin on your vegetables are also  recommended to maximize your experience.  Cooking - Healthy Salads and Dressing Clinical staff conducted group or individual video education with verbal and written material and guidebook.  Patient learns that vegetables, fruits, whole grains, and legumes are the foundations of the Pritikin Eating Plan. Recommendations include how to incorporate each of these in flavorful and healthy salads, and how to create homemade salad dressings.  Proper handling of ingredients is also covered. Cooking - Soups and [employer] - Soups and Desserts Clinical staff conducted group or individual video education with verbal and written material and guidebook.  Patient learns that Pritikin soups and desserts make for easy, nutritious, and delicious snacks and meal components that are low in sodium, fat, sugar, and calorie density, while high in vitamins, minerals, and filling fiber. Recommendations include simple and healthy ideas for soups and desserts.   Overview     The Pritikin Solution Program Overview Clinical staff conducted group or individual video education with verbal and written material and guidebook.  Patient learns that the results of the Pritikin Program have been documented in more than 100 articles published in peer-reviewed journals, and the benefits include reducing risk factors for (and, in some cases, even reversing) high cholesterol, high blood pressure, type 2 diabetes, obesity, and more! An overview of the three key pillars of the Pritikin Program will be covered: eating well, doing regular exercise, and having a healthy mind-set.  WORKSHOPS  Exercise: Exercise Basics: Building Your Action Plan Clinical staff led group instruction and group discussion with PowerPoint presentation and patient guidebook. To enhance the learning environment the use of posters, models and videos may be added. At the conclusion of this workshop, patients will comprehend the difference between physical  activity and exercise, as well as the benefits of incorporating both, into their routine. Patients will understand the FITT (Frequency, Intensity, Time, and Type) principle and how to use it to build an exercise action plan. In addition, safety concerns and other considerations for exercise and cardiac rehab will be addressed by the presenter. The purpose of this lesson is to promote a comprehensive and effective weekly exercise routine in order to improve patients' overall level of fitness.   Managing Heart Disease: Your Path to a Healthier Heart Clinical staff led group instruction and group discussion with PowerPoint presentation and patient guidebook. To enhance the learning environment the use of posters, models and videos may be added.At the conclusion of this workshop, patients will understand the anatomy and physiology of the heart. Additionally, they will understand how Pritikin's three pillars impact the risk factors, the progression, and the management of heart disease.  The purpose of this lesson is to provide a high-level overview of the heart, heart disease, and how the Pritikin lifestyle positively impacts risk factors.  Exercise Biomechanics Clinical staff led group instruction and group discussion with PowerPoint presentation and patient guidebook. To enhance the learning environment the use of posters, models and videos may be added. Patients will learn how the structural parts of their bodies function and how these functions impact their daily activities, movement, and exercise. Patients will learn how to promote a neutral spine, learn how to manage pain, and identify ways to improve their physical movement in order to promote healthy living. The purpose of this lesson is to expose patients to common physical limitations that impact physical activity. Participants will learn practical ways to adapt and manage aches and pains, and to minimize their effect on regular exercise.  Patients will learn how to maintain good posture while sitting, walking, and lifting.  Balance Training and Fall Prevention  Clinical staff led group instruction and group discussion with PowerPoint presentation and patient guidebook. To enhance the learning environment the use of posters, models and videos may be added. At the conclusion of this workshop, patients will understand the importance of their sensorimotor skills (vision, proprioception, and the vestibular system) in  maintaining their ability to balance as they age. Patients will apply a variety of balancing exercises that are appropriate for their current level of function. Patients will understand the common causes for poor balance, possible solutions to these problems, and ways to modify their physical environment in order to minimize their fall risk. The purpose of this lesson is to teach patients about the importance of maintaining balance as they age and ways to minimize their risk of falling.  WORKSHOPS   Nutrition:  Fueling a Ship broker led group instruction and group discussion with PowerPoint presentation and patient guidebook. To enhance the learning environment the use of posters, models and videos may be added. Patients will review the foundational principles of the Pritikin Eating Plan and understand what constitutes a serving size in each of the food groups. Patients will also learn Pritikin-friendly foods that are better choices when away from home and review make-ahead meal and snack options. Calorie density will be reviewed and applied to three nutrition priorities: weight maintenance, weight loss, and weight gain. The purpose of this lesson is to reinforce (in a group setting) the key concepts around what patients are recommended to eat and how to apply these guidelines when away from home by planning and selecting Pritikin-friendly options. Patients will understand how calorie density may be adjusted for  different weight management goals.  Mindful Eating  Clinical staff led group instruction and group discussion with PowerPoint presentation and patient guidebook. To enhance the learning environment the use of posters, models and videos may be added. Patients will briefly review the concepts of the Pritikin Eating Plan and the importance of low-calorie dense foods. The concept of mindful eating will be introduced as well as the importance of paying attention to internal hunger signals. Triggers for non-hunger eating and techniques for dealing with triggers will be explored. The purpose of this lesson is to provide patients with the opportunity to review the basic principles of the Pritikin Eating Plan, discuss the value of eating mindfully and how to measure internal cues of hunger and fullness using the Hunger Scale. Patients will also discuss reasons for non-hunger eating and learn strategies to use for controlling emotional eating.  Targeting Your Nutrition Priorities Clinical staff led group instruction and group discussion with PowerPoint presentation and patient guidebook. To enhance the learning environment the use of posters, models and videos may be added. Patients will learn how to determine their genetic susceptibility to disease by reviewing their family history. Patients will gain insight into the importance of diet as part of an overall healthy lifestyle in mitigating the impact of genetics and other environmental insults. The purpose of this lesson is to provide patients with the opportunity to assess their personal nutrition priorities by looking at their family history, their own health history and current risk factors. Patients will also be able to discuss ways of prioritizing and modifying the Pritikin Eating Plan for their highest risk areas  Menu  Clinical staff led group instruction and group discussion with PowerPoint presentation and patient guidebook. To enhance the learning  environment the use of posters, models and videos may be added. Using menus brought in from [employer], or printed from [employer], patients will apply the Pritikin dining out guidelines that were presented in the [employer] video. Patients will also be able to practice these guidelines in a variety of provided scenarios. The purpose of this lesson is to provide patients with the opportunity to practice hands-on learning  of the [employer] guidelines with actual menus and practice scenarios.  Label Reading Clinical staff led group instruction and group discussion with PowerPoint presentation and patient guidebook. To enhance the learning environment the use of posters, models and videos may be added. Patients will review and discuss the Pritikin label reading guidelines presented in Pritikin's Label Reading Educational series video. Using fool labels brought in from local grocery stores and markets, patients will apply the label reading guidelines and determine if the packaged food meet the Pritikin guidelines. The purpose of this lesson is to provide patients with the opportunity to review, discuss, and practice hands-on learning of the Pritikin Label Reading guidelines with actual packaged food labels. Cooking School  Pritikin's [employer] are designed to teach patients ways to prepare quick, simple, and affordable recipes at home. The importance of nutrition's role in chronic disease risk reduction is reflected in its emphasis in the overall Pritikin program. By learning how to prepare essential core Pritikin Eating Plan recipes, patients will increase control over what they eat; be able to customize the flavor of foods without the use of added salt, sugar, or fat; and improve the quality of the food they consume. By learning a set of core recipes which are easily assembled, quickly prepared, and affordable, patients are more likely to prepare more healthy  foods at home. These workshops focus on convenient breakfasts, simple entres, side dishes, and desserts which can be prepared with minimal effort and are consistent with nutrition recommendations for cardiovascular risk reduction. Cooking [employer] are taught by a [employer] logistics/support/administrative officer (RD) who has been trained by the [employer]. The chef or RD has a clear understanding of the importance of minimizing - if not completely eliminating - added fat, sugar, and sodium in recipes. Throughout the series of Cooking School Workshop sessions, patients will learn about healthy ingredients and efficient methods of cooking to build confidence in their capability to prepare    Cooking School weekly topics:  Adding Flavor- Sodium-Free  Fast and Healthy Breakfasts  Powerhouse Plant-Based Proteins  Satisfying Salads and Dressings  Simple Sides and Sauces  International Cuisine-Spotlight on the [employer] Zones  Delicious Desserts  Savory Soups  [employer] - Meals in a Astronomer Appetizers and Snacks  Comforting Weekend Breakfasts  One-Pot Wonders   Fast Evening Meals  Landscape architect Your [facility] Plate  WORKSHOPS   Healthy Mindset (Psychosocial):  Focused Goals, Sustainable Changes Clinical staff led group instruction and group discussion with PowerPoint presentation and patient guidebook. To enhance the learning environment the use of posters, models and videos may be added. Patients will be able to apply effective goal setting strategies to establish at least one personal goal, and then take consistent, meaningful action toward that goal. They will learn to identify common barriers to achieving personal goals and develop strategies to overcome them. Patients will also gain an understanding of how our mind-set can impact our ability to achieve goals and the importance of cultivating a positive and growth-oriented mind-set. The purpose of this lesson is to  provide patients with a deeper understanding of how to set and achieve personal goals, as well as the tools and strategies needed to overcome common obstacles which may arise along the way.  From Head to Heart: The Power of a Healthy Outlook  Clinical staff led group instruction and group discussion with PowerPoint presentation and patient guidebook. To enhance the learning environment the use of posters,  models and videos may be added. Patients will be able to recognize and describe the impact of emotions and mood on physical health. They will discover the importance of self-care and explore self-care practices which may work for them. Patients will also learn how to utilize the 4 C's to cultivate a healthier outlook and better manage stress and challenges. The purpose of this lesson is to demonstrate to patients how a healthy outlook is an essential part of maintaining good health, especially as they continue their cardiac rehab journey.  Healthy Sleep for a Healthy Heart Clinical staff led group instruction and group discussion with PowerPoint presentation and patient guidebook. To enhance the learning environment the use of posters, models and videos may be added. At the conclusion of this workshop, patients will be able to demonstrate knowledge of the importance of sleep to overall health, well-being, and quality of life. They will understand the symptoms of, and treatments for, common sleep disorders. Patients will also be able to identify daytime and nighttime behaviors which impact sleep, and they will be able to apply these tools to help manage sleep-related challenges. The purpose of this lesson is to provide patients with a general overview of sleep and outline the importance of quality sleep. Patients will learn about a few of the most common sleep disorders. Patients will also be introduced to the concept of "sleep hygiene," and discover ways to self-manage certain sleeping problems through  simple daily behavior changes. Finally, the workshop will motivate patients by clarifying the links between quality sleep and their goals of heart-healthy living.   Recognizing and Reducing Stress Clinical staff led group instruction and group discussion with PowerPoint presentation and patient guidebook. To enhance the learning environment the use of posters, models and videos may be added. At the conclusion of this workshop, patients will be able to understand the types of stress reactions, differentiate between acute and chronic stress, and recognize the impact that chronic stress has on their health. They will also be able to apply different coping mechanisms, such as reframing negative self-talk. Patients will have the opportunity to practice a variety of stress management techniques, such as deep abdominal breathing, progressive muscle relaxation, and/or guided imagery.  The purpose of this lesson is to educate patients on the role of stress in their lives and to provide healthy techniques for coping with it.  Learning Barriers/Preferences:  Learning Barriers/Preferences - 03/17/23 1224       Learning Barriers/Preferences   Learning Barriers Hearing;Sight   wears glasses and bolateral hearing aids   Learning Preferences Computer/Internet;Skilled Demonstration;Written Material;Individual Instruction;Group Instruction             Education Topics:  Knowledge Questionnaire Score:  Knowledge Questionnaire Score - 03/17/23 1225       Knowledge Questionnaire Score   Pre Score 24/24             Core Components/Risk Factors/Patient Goals at Admission:  Personal Goals and Risk Factors at Admission - 03/17/23 1119       Core Components/Risk Factors/Patient Goals on Admission    Weight Management Weight Maintenance    Diabetes Yes    Intervention Provide education about signs/symptoms and action to take for hypo/hyperglycemia.;Provide education about proper nutrition, including  hydration, and aerobic/resistive exercise prescription along with prescribed medications to achieve blood glucose in normal ranges: Fasting glucose 65-99 mg/dL    Expected Outcomes Short Term: Participant verbalizes understanding of the signs/symptoms and immediate care of hyper/hypoglycemia, proper foot care and importance of  medication, aerobic/resistive exercise and nutrition plan for blood glucose control.;Long Term: Attainment of HbA1C < 7%.    Hypertension Yes    Intervention Provide education on lifestyle modifcations including regular physical activity/exercise, weight management, moderate sodium restriction and increased consumption of fresh fruit, vegetables, and low fat dairy, alcohol moderation, and smoking cessation.;Monitor prescription use compliance.    Expected Outcomes Short Term: Continued assessment and intervention until BP is < 140/14mm HG in hypertensive participants. < 130/36mm HG in hypertensive participants with diabetes, heart failure or chronic kidney disease.;Long Term: Maintenance of blood pressure at goal levels.    Lipids Yes    Intervention Provide education and support for participant on nutrition & aerobic/resistive exercise along with prescribed medications to achieve LDL 70mg , HDL >40mg .    Expected Outcomes Short Term: Participant states understanding of desired cholesterol values and is compliant with medications prescribed. Participant is following exercise prescription and nutrition guidelines.;Long Term: Cholesterol controlled with medications as prescribed, with individualized exercise RX and with personalized nutrition plan. Value goals: LDL < 70mg , HDL > 40 mg.             Core Components/Risk Factors/Patient Goals Review:   Goals and Risk Factor Review     Row Name 03/25/23 1414 04/16/23 1004           Core Components/Risk Factors/Patient Goals Review   Personal Goals Review Weight Management/Obesity;Hypertension;Lipids;Diabetes Weight  Management/Obesity;Hypertension;Lipids;Diabetes      Review Saige started cardiac rehab on 03/25/23. Dewitt did well with exercise. Vital signs and CBG's were stable. Esaul is doing well with exercise at cardiac rehab. Vital signs and CBG's have been stable.      Expected Outcomes Fread will continue to particpate in cardiac rehab for exercise, nutrtion and lifestyle modifications Morrill will continue to particpate in cardiac rehab for exercise, nutrtion and lifestyle modifications               Core Components/Risk Factors/Patient Goals at Discharge (Final Review):   Goals and Risk Factor Review - 04/16/23 1004       Core Components/Risk Factors/Patient Goals Review   Personal Goals Review Weight Management/Obesity;Hypertension;Lipids;Diabetes    Review Jodi is doing well with exercise at cardiac rehab. Vital signs and CBG's have been stable.    Expected Outcomes Weslie will continue to particpate in cardiac rehab for exercise, nutrtion and lifestyle modifications             ITP Comments:  ITP Comments     Row Name 03/17/23 1051 03/25/23 0842 04/16/23 1002       ITP Comments Armanda Magic, MD: Medical Director.  Introduction to the [facility] Program/Intensive Cardiac Rehab.  Initial orientation packet reviewed with the patient. 30 Day ITP Review. [place] started cardiac rehab on 03/25/23. Axtyn did well with exercise. 30 Day ITP Review. Jash has good attendance and participation  with exercise at cardiac rehab.              Comments: See ITP comments.Thayer Headings RN BSN

## 2023-04-17 ENCOUNTER — Encounter (HOSPITAL_COMMUNITY)
Admission: RE | Admit: 2023-04-17 | Discharge: 2023-04-17 | Disposition: A | Discharge status: Home / Self Care | Payer: Medicare Other | Source: Ambulatory Visit | Attending: Cardiology

## 2023-04-17 DIAGNOSIS — Z955 Presence of coronary angioplasty implant and graft: Secondary | ICD-10-CM

## 2023-04-17 DIAGNOSIS — Z48812 Encounter for surgical aftercare following surgery on the circulatory system: Secondary | ICD-10-CM | POA: Diagnosis not present

## 2023-04-20 ENCOUNTER — Encounter (HOSPITAL_COMMUNITY)
Admission: RE | Admit: 2023-04-20 | Discharge: 2023-04-20 | Disposition: A | Discharge status: Home / Self Care | Payer: Medicare Other | Source: Ambulatory Visit | Attending: Cardiology | Admitting: Cardiology

## 2023-04-20 DIAGNOSIS — Z48812 Encounter for surgical aftercare following surgery on the circulatory system: Secondary | ICD-10-CM | POA: Insufficient documentation

## 2023-04-20 DIAGNOSIS — Z955 Presence of coronary angioplasty implant and graft: Secondary | ICD-10-CM | POA: Insufficient documentation

## 2023-04-22 ENCOUNTER — Encounter (HOSPITAL_COMMUNITY)
Admission: RE | Admit: 2023-04-22 | Discharge: 2023-04-22 | Disposition: A | Discharge status: Home / Self Care | Payer: Medicare Other | Source: Ambulatory Visit | Attending: Cardiology | Admitting: Cardiology

## 2023-04-22 DIAGNOSIS — Z48812 Encounter for surgical aftercare following surgery on the circulatory system: Secondary | ICD-10-CM | POA: Diagnosis not present

## 2023-04-22 DIAGNOSIS — Z955 Presence of coronary angioplasty implant and graft: Secondary | ICD-10-CM

## 2023-04-24 ENCOUNTER — Encounter (HOSPITAL_COMMUNITY)
Admission: RE | Admit: 2023-04-24 | Discharge: 2023-04-24 | Disposition: A | Discharge status: Home / Self Care | Payer: Medicare Other | Source: Ambulatory Visit | Attending: Cardiology

## 2023-04-24 DIAGNOSIS — Z955 Presence of coronary angioplasty implant and graft: Secondary | ICD-10-CM | POA: Diagnosis not present

## 2023-04-24 DIAGNOSIS — Z48812 Encounter for surgical aftercare following surgery on the circulatory system: Secondary | ICD-10-CM | POA: Diagnosis not present

## 2023-04-24 NOTE — Progress Notes (Signed)
 Reviewed home exercise Rx with patient today.  Encouraged warm-up, cool-down, and stretching. Reviewed THRR of  59 - 118 and keeping RPE between 11-13. Encouraged to hydrate with activity.  Reviewed weather parameters for temperature and humidity for safe exercise outdoors. Reviewed S/S to terminate exercise and when to call 911 vs MD. Reviewed the use of NTG and pt was encouraged to carry at all times. Pt encouraged to know his blood sugar before beginning exercise. Pt also encouraged to carry glucose tabs if exercising outdoors.  Pt encouraged to always carry a cell phone for safety when exercising outdoors. Pt verbalized understanding of the home exercise Rx and was provided a copy.   Alm Parkins MS, ACSM-CEP, CCRP

## 2023-04-27 ENCOUNTER — Encounter (HOSPITAL_COMMUNITY)
Admission: RE | Admit: 2023-04-27 | Discharge: 2023-04-27 | Disposition: A | Discharge status: Home / Self Care | Payer: Medicare Other | Source: Ambulatory Visit | Attending: Cardiology | Admitting: Cardiology

## 2023-04-27 DIAGNOSIS — Z955 Presence of coronary angioplasty implant and graft: Secondary | ICD-10-CM | POA: Diagnosis not present

## 2023-04-27 DIAGNOSIS — Z48812 Encounter for surgical aftercare following surgery on the circulatory system: Secondary | ICD-10-CM | POA: Diagnosis not present

## 2023-04-29 ENCOUNTER — Encounter (HOSPITAL_COMMUNITY)
Admission: RE | Admit: 2023-04-29 | Discharge: 2023-04-29 | Disposition: A | Discharge status: Home / Self Care | Payer: Medicare Other | Source: Ambulatory Visit | Attending: Cardiology | Admitting: Cardiology

## 2023-04-29 DIAGNOSIS — Z955 Presence of coronary angioplasty implant and graft: Secondary | ICD-10-CM

## 2023-04-29 DIAGNOSIS — Z48812 Encounter for surgical aftercare following surgery on the circulatory system: Secondary | ICD-10-CM | POA: Diagnosis not present

## 2023-05-01 ENCOUNTER — Encounter (HOSPITAL_COMMUNITY)
Admission: RE | Admit: 2023-05-01 | Discharge: 2023-05-01 | Disposition: A | Discharge status: Home / Self Care | Payer: Medicare Other | Source: Ambulatory Visit | Attending: Cardiology | Admitting: Cardiology

## 2023-05-01 DIAGNOSIS — Z955 Presence of coronary angioplasty implant and graft: Secondary | ICD-10-CM

## 2023-05-01 DIAGNOSIS — Z48812 Encounter for surgical aftercare following surgery on the circulatory system: Secondary | ICD-10-CM | POA: Diagnosis not present

## 2023-05-04 ENCOUNTER — Encounter (HOSPITAL_COMMUNITY)
Admission: RE | Admit: 2023-05-04 | Discharge: 2023-05-04 | Disposition: A | Discharge status: Home / Self Care | Payer: Medicare Other | Source: Ambulatory Visit | Attending: Cardiology

## 2023-05-04 DIAGNOSIS — Z48812 Encounter for surgical aftercare following surgery on the circulatory system: Secondary | ICD-10-CM | POA: Diagnosis not present

## 2023-05-04 DIAGNOSIS — Z955 Presence of coronary angioplasty implant and graft: Secondary | ICD-10-CM | POA: Diagnosis not present

## 2023-05-05 ENCOUNTER — Telehealth (HOSPITAL_COMMUNITY): Payer: Self-pay

## 2023-05-06 ENCOUNTER — Encounter (HOSPITAL_COMMUNITY)
Admission: RE | Admit: 2023-05-06 | Discharge: 2023-05-06 | Disposition: A | Discharge status: Home / Self Care | Payer: Medicare Other | Source: Ambulatory Visit | Attending: Cardiology

## 2023-05-06 DIAGNOSIS — Z955 Presence of coronary angioplasty implant and graft: Secondary | ICD-10-CM

## 2023-05-06 DIAGNOSIS — Z48812 Encounter for surgical aftercare following surgery on the circulatory system: Secondary | ICD-10-CM | POA: Diagnosis not present

## 2023-05-08 ENCOUNTER — Encounter (HOSPITAL_COMMUNITY)
Admission: RE | Admit: 2023-05-08 | Discharge: 2023-05-08 | Disposition: A | Discharge status: Home / Self Care | Payer: Medicare Other | Source: Ambulatory Visit | Attending: Cardiology | Admitting: Cardiology

## 2023-05-08 DIAGNOSIS — Z48812 Encounter for surgical aftercare following surgery on the circulatory system: Secondary | ICD-10-CM | POA: Diagnosis not present

## 2023-05-08 DIAGNOSIS — Z955 Presence of coronary angioplasty implant and graft: Secondary | ICD-10-CM | POA: Diagnosis not present

## 2023-05-11 ENCOUNTER — Encounter (HOSPITAL_COMMUNITY)
Admission: RE | Admit: 2023-05-11 | Discharge: 2023-05-11 | Disposition: A | Discharge status: Home / Self Care | Payer: Medicare Other | Source: Ambulatory Visit | Attending: Cardiology

## 2023-05-11 ENCOUNTER — Other Ambulatory Visit: Payer: Self-pay

## 2023-05-11 DIAGNOSIS — Z955 Presence of coronary angioplasty implant and graft: Secondary | ICD-10-CM

## 2023-05-11 DIAGNOSIS — Z48812 Encounter for surgical aftercare following surgery on the circulatory system: Secondary | ICD-10-CM | POA: Diagnosis not present

## 2023-05-11 MED ORDER — CLOPIDOGREL BISULFATE 75 MG PO TABS: 75.0000 mg | ORAL_TABLET | Freq: Every day | ORAL | 9 refills | Status: DC

## 2023-05-13 ENCOUNTER — Encounter (HOSPITAL_COMMUNITY)
Admission: RE | Admit: 2023-05-13 | Discharge: 2023-05-13 | Disposition: A | Discharge status: Home / Self Care | Payer: Medicare Other | Source: Ambulatory Visit | Attending: Cardiology | Admitting: Cardiology

## 2023-05-13 DIAGNOSIS — Z955 Presence of coronary angioplasty implant and graft: Secondary | ICD-10-CM

## 2023-05-13 DIAGNOSIS — Z48812 Encounter for surgical aftercare following surgery on the circulatory system: Secondary | ICD-10-CM | POA: Diagnosis not present

## 2023-05-15 ENCOUNTER — Encounter (HOSPITAL_COMMUNITY)
Admission: RE | Admit: 2023-05-15 | Discharge: 2023-05-15 | Disposition: A | Discharge status: Home / Self Care | Payer: Medicare Other | Source: Ambulatory Visit | Attending: Cardiology | Admitting: Cardiology

## 2023-05-15 DIAGNOSIS — Z955 Presence of coronary angioplasty implant and graft: Secondary | ICD-10-CM | POA: Diagnosis not present

## 2023-05-15 DIAGNOSIS — Z48812 Encounter for surgical aftercare following surgery on the circulatory system: Secondary | ICD-10-CM | POA: Diagnosis not present

## 2023-05-18 ENCOUNTER — Encounter (HOSPITAL_COMMUNITY)
Admission: RE | Admit: 2023-05-18 | Discharge: 2023-05-18 | Disposition: A | Discharge status: Home / Self Care | Payer: Medicare Other | Source: Ambulatory Visit | Attending: Cardiology | Admitting: Cardiology

## 2023-05-18 DIAGNOSIS — Z48812 Encounter for surgical aftercare following surgery on the circulatory system: Secondary | ICD-10-CM | POA: Insufficient documentation

## 2023-05-18 DIAGNOSIS — Z955 Presence of coronary angioplasty implant and graft: Secondary | ICD-10-CM | POA: Diagnosis not present

## 2023-05-19 NOTE — Progress Notes (Signed)
 Cardiac Individual Treatment Plan  Patient Details  Name: RAMY GRETH MRN: [id number] Date of Birth: 1950-08-24 Referring Provider:   Flowsheet Row INTENSIVE CARDIAC REHAB ORIENT from 03/17/2023 in [facility]  Referring Provider Olga Millers, MD       Initial Encounter Date:  Flowsheet Row INTENSIVE CARDIAC REHAB ORIENT from 03/17/2023 in [facility]  Date 03/17/23       Visit Diagnosis: 01/29/23 Status post coronary artery stent placement RCA x 3  Patient's Home Medications on Admission:  Current Outpatient Medications:    ACCU-CHEK GUIDE test strip, 1 each by Other route daily in the afternoon., Disp: , Rfl:    acetaminophen (TYLENOL) 650 MG CR tablet, Take 1,300 mg by mouth 2 (two) times daily., Disp: , Rfl:    aspirin EC 81 MG EC tablet, Take 1 tablet (81 mg total) by mouth daily. Swallow whole., Disp: 30 tablet, Rfl: 11   atorvastatin (LIPITOR) 80 MG tablet, Take 80 mg by mouth daily. , Disp: , Rfl:    BD PEN NEEDLE NANO 2ND GEN 32G X 4 MM MISC, USE AS DIRECTED ONCE A DAY, Disp: , Rfl:    Blood Glucose Monitoring Suppl (ACCU-CHEK AVIVA PLUS) w/Device KIT, daily in the afternoon., Disp: , Rfl:    canagliflozin (INVOKANA) 300 MG TABS tablet, Take 300 mg by mouth daily before breakfast., Disp: , Rfl:    cholestyramine light 4 g POWD, Take 4 g by mouth daily. 1 Scoop, Disp: , Rfl:    clopidogrel (PLAVIX) 75 MG tablet, Take 1 tablet (75 mg total) by mouth daily., Disp: 30 tablet, Rfl: 9   doxazosin (CARDURA) 2 MG tablet, Take 1 tablet (2 mg total) by mouth daily., Disp: 30 tablet, Rfl: 6   insulin degludec (TRESIBA) 100 UNIT/ML FlexTouch Pen, Inject 20 Units into the skin at bedtime., Disp: , Rfl:    levothyroxine (SYNTHROID) 125 MCG tablet, Take 125 mcg by mouth every morning., Disp: , Rfl:    metFORMIN (GLUCOPHAGE) 1000 MG tablet, Take 1,000 mg by mouth 2  (two) times daily with a meal., Disp: , Rfl:    nitroGLYCERIN (NITROSTAT) 0.4 MG SL tablet, Place 1 tablet (0.4 mg total) under the tongue every 5 (five) minutes as needed for chest pain., Disp: 25 tablet, Rfl: 3   pramipexole (MIRAPEX) 0.125 MG tablet, Take 0.5 mg by mouth at bedtime., Disp: , Rfl:    ranolazine (RANEXA) 500 MG 12 hr tablet, Take 1 tablet (500 mg total) by mouth 2 (two) times daily., Disp: 180 tablet, Rfl: 2  Past Medical History: Past Medical History:  Diagnosis Date   Acid reflux    Arthritis    BPH (benign prostatic hyperplasia)    mild   CAD in native artery    a. NSTEMI 12/2019 s/p CABGx5 with bovine AVR.   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, RDI 10.2/hr ([name])   Urinary frequency    Wears glasses    Wears hearing aid    both  ears    Tobacco Use: Social History   Tobacco Use  Smoking Status Former   Current packs/day: 0.00   Average packs/day: 1 pack/day for 53.0 years (53.0 ttl pk-yrs)   Types: Cigarettes   Start date: 12/16/1966   Quit date: 12/16/2019   Years since quitting: 3.4  Smokeless Tobacco Never    Labs: Review Flowsheet  More data exists      Latest Ref Rng & Units 12/21/2019 04/10/2020 09/05/2020 01/29/2023 01/30/2023  Labs for ITP Cardiac and Pulmonary Rehab  Cholestrol 0 - 200 mg/dL - 213  086  - 89   LDL (calc) 0 - 99 mg/dL - 75  48  - 41   HDL-C >40 mg/dL - 47  55  - 37   Trlycerides <150 mg/dL - 63  48  - 54   Hemoglobin A1c 4.8 - 5.6 % - - - 7.0  -  PH, Arterial 7.350 - 7.450 7.357  7.404  7.377  7.438  7.437  7.338  7.347  7.411  - - - -  PCO2 arterial 32.0 - 48.0 mmHg 37.2  33.2   37.9  37.1  37.3  47.3  47.8  37.3  - - - -  Bicarbonate 20.0 - 28.0 mmol/L 20.8  20.8  22.6  25.1  25.1  25.4  26.2  23.2  - - - -  TCO2 22 - 32 mmol/L 22  22  24  23  26  26  26  27  27  28  24  25   - - - -  Acid-base deficit 0.0 - 2.0 mmol/L 4.0  3.0  3.0  1.0  0.8  - - - -  O2 Saturation % 97.0  99.0  99.0  100.0  100.0  89.0  100.0  97.9  - - - -    Details       Multiple values from one day are sorted in reverse-chronological order         Capillary Blood Glucose: Lab Results  Component Value Date   GLUCAP 140 (H) 04/01/2023   GLUCAP 114 (H) 03/25/2023   GLUCAP 114 (H) 03/25/2023   GLUCAP 88 01/30/2023   GLUCAP 143 (H) 01/29/2023     Exercise Target Goals: Exercise Program Goal: Individual exercise prescription set using results from initial 6 min walk test and THRR while considering  patient's activity barriers and safety.   Exercise Prescription Goal: Initial exercise prescription builds to 30-45 minutes a day of aerobic activity, 2-3 days per week.  Home exercise guidelines will be given to patient during program as part of exercise prescription that the participant will acknowledge.  Activity Barriers & Risk Stratification:  Activity Barriers & Cardiac Risk Stratification - 03/17/23 1220       Activity Barriers & Cardiac Risk Stratification   Activity Barriers Arthritis;Back Problems;Joint Problems;Deconditioning;Balance Concerns    Cardiac Risk Stratification High             6 Minute Walk:  6 Minute Walk     Row Name 03/17/23 1219         6 Minute Walk   Phase Initial     Distance 1833 feet     Walk Time 6 minutes     # of Rest Breaks 0     MPH 3.47     METS 4.4     RPE 13     Perceived Dyspnea  0     VO2 Peak 15.3  Symptoms No     Resting HR 69 bpm     Resting BP 114/60     Resting Oxygen Saturation  97 %     Exercise Oxygen Saturation  during 6 min walk 99 %     Max Ex. HR 119 bpm     Max Ex. BP 152/68     2 Minute Post BP  130/70              Oxygen Initial Assessment:   Oxygen Re-Evaluation:   Oxygen Discharge (Final Oxygen Re-Evaluation):   Initial Exercise Prescription:  Initial Exercise Prescription - 03/17/23 1200       Date of Initial Exercise RX and Referring Provider   Date 03/17/23    Referring Provider Olga Millers, MD    Expected Discharge Date 06/10/23      Treadmill   MPH 3    Grade 1    Minutes 15    METs 3.71      Recumbant Elliptical   Level 2    RPM 60    Watts 50    Minutes 15    METs 4.1      Prescription Details   Frequency (times per week) 3    Duration Progress to 30 minutes of continuous aerobic without signs/symptoms of physical distress      Intensity   THRR 40-80% of Max Heartrate 59-118    Ratings of Perceived Exertion 11-13    Perceived Dyspnea 0-4      Progression   Progression Continue progressive overload as per policy without signs/symptoms or physical distress.      Resistance Training   Training Prescription Yes    Weight 4 lbs    Reps 10-15             Perform Capillary Blood Glucose checks as needed.  Exercise Prescription Changes:   Exercise Prescription Changes     Row Name 03/25/23 1400 04/08/23 1015 04/22/23 1600 04/24/23 1400 05/06/23 1200     Response to Exercise   Blood Pressure (Admit) 122/60 124/62 124/60 124/86 112/70   Blood Pressure (Exercise) 142/70 138/70 -- -- --   Blood Pressure (Exit) 110/80 110/58 120/62 110/58 108/70   Heart Rate (Admit) 73 bpm 70 bpm 62 bpm 71 bpm 63 bpm   Heart Rate (Exercise) 122 bpm 125 bpm 112 bpm 119 bpm 123 bpm   Heart Rate (Exit) 79 bpm 79 bpm 71 bpm 78 bpm 74 bpm   Rating of Perceived Exertion (Exercise) 12 12 11 11 11    Symptoms None None None None Non   Comments Pt's first day in the CRP2 program Reviewed METs Reviewed METs and goals Reviewed Home exercise Rx Reviewed METs   Duration Continue with 30 min of aerobic exercise without signs/symptoms of physical distress.  Continue with 30 min of aerobic exercise without signs/symptoms of physical distress. Continue with 30 min of aerobic exercise without signs/symptoms of physical distress. Continue with 30 min of aerobic exercise without signs/symptoms of physical distress. Continue with 30 min of aerobic exercise without signs/symptoms of physical distress.   Intensity THRR unchanged THRR unchanged THRR unchanged THRR unchanged THRR unchanged     Progression   Progression Continue to progress workloads to maintain intensity without signs/symptoms of physical distress. Continue to progress workloads to maintain intensity without signs/symptoms of physical distress. Continue to progress workloads to maintain intensity without signs/symptoms of physical distress. Continue to progress workloads to maintain intensity without signs/symptoms of physical distress. Continue to  progress workloads to maintain intensity without signs/symptoms of physical distress.   Average METs 3.7 3.6 4.35 4.35 4.23     Resistance Training   Training Prescription No No No Yes No   Weight No weights on wednesdays No weights on wednesdays No weights on wednesdays 4 lb No weights on wednesdays   Reps -- -- -- 10-15 --   Time -- -- -- 10 Minutes --     Interval Training   Interval Training No No No No No     Treadmill   MPH 3 -- 3 3 3.2   Grade 1 -- 1 1 1    Minutes 15 -- 15 15 15    METs 3.71 -- 3.71 3.71 4.5     Arm Ergometer   Level -- 1.5 -- -- --   Minutes -- 15 -- -- --   METs -- 2.4 -- -- --     Recumbant Elliptical   Level 2 2 2 2 3    RPM 59 60 66 63 62   Watts 79 82 102 89 93   Minutes 15 15 15 15 15    METs 3.7 4.8 5 5  5.1     Home Exercise Plan   Plans to continue exercise at -- -- -- Home (comment) Home (comment)   Frequency -- -- -- Add 2 additional days to program exercise sessions. Add 2 additional days to program exercise sessions.   Initial Home Exercises Provided -- -- -- 04/24/23 04/24/23    Row Name  05/18/23 1600             Response to Exercise   Blood Pressure (Admit) 130/62       Blood Pressure (Exit) 122/70       Heart Rate (Admit) 66 bpm       Heart Rate (Exercise) 127 bpm       Heart Rate (Exit) 72 bpm       Rating of Perceived Exertion (Exercise) 11       Symptoms None       Comments Reviewed METs and goals       Duration Continue with 30 min of aerobic exercise without signs/symptoms of physical distress.       Intensity THRR unchanged         Progression   Progression Continue to progress workloads to maintain intensity without signs/symptoms of physical distress.       Average METs 5.2         Resistance Training   Training Prescription Yes       Weight 5 lbs       Reps 10-15       Time 10 Minutes         Interval Training   Interval Training No         Recumbant Elliptical   Level 4       RPM 63       Watts 97       Minutes 15       METs 5.4         Rower   Level 2       Watts 33       Minutes 15       METs 5.03         Home Exercise Plan   Plans to continue exercise at Home (comment)       Frequency Add 2 additional days to program exercise sessions.       Initial Home  Exercises Provided 04/24/23                Exercise Comments:   Exercise Comments     Row Name 03/25/23 1409 04/08/23 1015 04/22/23 1625 04/24/23 1410 05/06/23 1210   Exercise Comments Pt's frist day in hte CRP2 program. Pt exercised without complaints at 3.7 METs. Pt is off to a great start. Reviewed METs. Pt has been using the arm ergometer the last several sessions due to twisiting his ankle at home and not feeling comfortable on the treadmill. Pt wants to return to treadmill in another week. Pt is progressing. Reviewed METs and goals. No changes to the exercise Rx today. Reviewed home exercise Rx with patient today. Pt is walking when the weather allows or using his stationary bike. Pt is doing 30-40 2-3x/week in addtion to the CRP2 program. Reviewed METs. Pt is  progressing. Pt would like to try rower next session instead of treadmill.    Row Name 05/18/23 1655           Exercise Comments Reviewed METs and goals Pt is progressing. Pt is using the rower instead of the treadmill and is enjoying it very much.                Exercise Goals and Review:   Exercise Goals     Row Name 03/17/23 1115             Exercise Goals   Increase Physical Activity Yes       Intervention Provide advice, education, support and counseling about physical activity/exercise needs.;Develop an individualized exercise prescription for aerobic and resistive training based on initial evaluation findings, risk stratification, comorbidities and participant's personal goals.       Expected Outcomes Short Term: Attend rehab on a regular basis to increase amount of physical activity.;Long Term: Exercising regularly at least 3-5 days a week.;Long Term: Add in home exercise to make exercise part of routine and to increase amount of physical activity.       Increase Strength and Stamina Yes       Intervention Provide advice, education, support and counseling about physical activity/exercise needs.;Develop an individualized exercise prescription for aerobic and resistive training based on initial evaluation findings, risk stratification, comorbidities and participant's personal goals.       Expected Outcomes Short Term: Increase workloads from initial exercise prescription for resistance, speed, and METs.;Short Term: Perform resistance training exercises routinely during rehab and add in resistance training at home;Long Term: Improve cardiorespiratory fitness, muscular endurance and strength as measured by increased METs and functional capacity ( )       Able to understand and use rate of perceived exertion (RPE) scale Yes       Intervention Provide education and explanation on how to use RPE scale       Expected Outcomes Short Term: Able to use RPE daily in rehab to express  subjective intensity level;Long Term:  Able to use RPE to guide intensity level when exercising independently       Knowledge and understanding of Target Heart Rate Range (THRR) Yes       Intervention Provide education and explanation of THRR including how the numbers were predicted and where they are located for reference       Expected Outcomes Short Term: Able to state/look up THRR;Long Term: Able to use THRR to govern intensity when exercising independently;Short Term: Able to use daily as guideline for intensity in rehab       Understanding  of Exercise Prescription Yes       Intervention Provide education, explanation, and written materials on patient's individual exercise prescription       Expected Outcomes Short Term: Able to explain program exercise prescription;Long Term: Able to explain home exercise prescription to exercise independently                Exercise Goals Re-Evaluation :  Exercise Goals Re-Evaluation     Row Name 03/25/23 1408 04/22/23 1624 05/18/23 1654         Exercise Goal Re-Evaluation   Exercise Goals Review Increase Physical Activity;Understanding of Exercise Prescription;Increase Strength and Stamina;Knowledge and understanding of Target Heart Rate Range (THRR);Able to understand and use rate of perceived exertion (RPE) scale Increase Physical Activity;Understanding of Exercise Prescription;Increase Strength and Stamina;Knowledge and understanding of Target Heart Rate Range (THRR);Able to understand and use rate of perceived exertion (RPE) scale Increase Physical Activity;Understanding of Exercise Prescription;Increase Strength and Stamina;Knowledge and understanding of Target Heart Rate Range (THRR);Able to understand and use rate of perceived exertion (RPE) scale     Comments Pt's first day in the CRP2 program. Pt understands the exercise Rx, RPE scale and THRR. Reviewed METs and goals. Pt is making progress on his goal of getting back into a regualr exercise  program. In addtion to the CRP2 program, pt voices walking at home or riding his stationary bike 2-3x/week. Pt also reports progress on his goal of increased strength and stamina, Reviewed METs and goals. Pt has rreached his goal of getting back into a regualr exercise program. In addtion to the CRP2 program, pt voices walking at home or riding his stationary bike 2-3x/week. Pt continues reports progress on his goal of increased strength and stamina. Pt has not returned to hiking or fishing yet due to the weather, but feels that he could.     Expected Outcomes Will continue to montior patient and progress exercise workloads as tolerated. Will continue to montior patient and progress exercise workloads as tolerated. Will continue to montior patient and progress exercise workloads as tolerated.              Discharge Exercise Prescription (Final Exercise Prescription Changes):  Exercise Prescription Changes - 05/18/23 1600       Response to Exercise   Blood Pressure (Admit) 130/62    Blood Pressure (Exit) 122/70    Heart Rate (Admit) 66 bpm    Heart Rate (Exercise) 127 bpm    Heart Rate (Exit) 72 bpm    Rating of Perceived Exertion (Exercise) 11    Symptoms None    Comments Reviewed METs and goals    Duration Continue with 30 min of aerobic exercise without signs/symptoms of physical distress.    Intensity THRR unchanged      Progression   Progression Continue to progress workloads to maintain intensity without signs/symptoms of physical distress.    Average METs 5.2      Resistance Training   Training Prescription Yes    Weight 5 lbs    Reps 10-15    Time 10 Minutes      Interval Training   Interval Training No      Recumbant Elliptical   Level 4    RPM 63    Watts 97    Minutes 15    METs 5.4      Rower   Level 2    Watts 33    Minutes 15    METs 5.03      Home  Exercise Plan   Plans to continue exercise at Home (comment)    Frequency Add 2 additional days to  program exercise sessions.    Initial Home Exercises Provided 04/24/23             Nutrition:  Target Goals: Understanding of nutrition guidelines, daily intake of sodium 1500mg , cholesterol 200mg , calories 30% from fat and 7% or less from saturated fats, daily to have 5 or more servings of fruits and vegetables.  Biometrics:  Pre Biometrics - 03/17/23 1052       Pre Biometrics   Waist Circumference 38 inches    Hip Circumference 38.5 inches    Waist to Hip Ratio 0.99 %    Triceps Skinfold 8 mm    % Body Fat 22.7 %    Grip Strength 32 kg    Flexibility 15.25 in    Single Leg Stand 6.62 seconds              Nutrition Therapy Plan and Nutrition Goals:  Nutrition Therapy & Goals - 05/12/23 1147       Nutrition Therapy   Diet Heart Healthy/Carbohydrate Consistent Diet    Drug/Food Interactions Statins/Certain Fruits      Personal Nutrition Goals   Nutrition Goal Patient to identify strategies for reducing cardiovascular risk by attending the weekly [employer] education and nutrition series   goal in action.   Personal Goal #2 Patient to improve diet quality by using the plate method as a daily guide for meal planning to include lean protein/plant protein, fruits, vegetables, whole grains, nonfat dairy as part of well balanced diet   goal in action.   Personal Goal #3 Patient to reduce sodium to 1500mg  per day   goal in action.   Personal Goal #4 Patient to identify strategies for blood sugar control with goal A1c <7%   goal in action.   Comments Goals in action. Farron has previously completed Intensive Cardiac Rehab in May 2024. At that time he also completed one on one nutrition counseling and consistently attended the [employer] education series. Sheriff continues to attend the [employer] and nutrition series regularly. He has good nutrition knowledge of heart healthy diet including increased fiber, decreased saturated fat, and reduced sodium intake. He continues to  work on blood sugar control and reports improved am/fasting blood sugars; most recent A1c was 7.0.. He has adopted more plant based eating and increased dietary fiber. LDL remains well controlled.   He has maintained his weight since starting with our program. Alexey will continue to benefit from participation in intensive cardiac rehab for nutrition, exercise, and lifestyle modificaton.      Intervention Plan   Intervention Prescribe, educate and counsel regarding individualized specific dietary modifications aiming towards targeted core components such as weight, hypertension, lipid management, diabetes, heart failure and other comorbidities.;Nutrition handout(s) given to patient.    Expected Outcomes Short Term Goal: Understand basic principles of dietary content, such as calories, fat, sodium, cholesterol and nutrients.;Long Term Goal: Adherence to prescribed nutrition plan.;Short Term Goal: A plan has been developed with personal nutrition goals set during dietitian appointment.             Nutrition Assessments:  Nutrition Assessments - 03/25/23 1402       Rate Your Plate Scores   Pre Score 78            MEDIFICTS Score Key: >=70 Need to make dietary changes  40-70 Heart Healthy Diet <= 40 Therapeutic Level Cholesterol Diet  Flowsheet Row INTENSIVE CARDIAC REHAB from 03/25/2023 in [facility]  Picture Your Plate Total Score on Admission 78      Picture Your Plate Scores: <40 Unhealthy dietary pattern with much room for improvement. 41-50 Dietary pattern unlikely to meet recommendations for good health and room for improvement. 51-60 More healthful dietary pattern, with some room for improvement.  >60 Healthy dietary pattern, although there may be some specific behaviors that could be improved.    Nutrition Goals Re-Evaluation:  Nutrition Goals Re-Evaluation     Row Name 05/12/23 1147             Goals    Current Weight 167 lb 1.7 oz (75.8 kg)       Comment A1c 7.0, LDL 41, HDL 37, Lpa 151       Expected Outcome Goals in action. Kaceton has previously completed Intensive Cardiac Rehab in May 2024. At that time he also completed one on one nutrition counseling and consistently attended the [employer] education series. Peggy continues to attend the [employer] and nutrition series regularly. He has good nutrition knowledge of heart healthy diet including increased fiber, decreased saturated fat, and reduced sodium intake. He continues to work on blood sugar control and reports improved am/fasting blood sugars; most recent A1c was 7.0.. He has adopted more plant based eating and increased dietary fiber. LDL remains well controlled. He has maintained his weight since starting with our program. Kavon will continue to benefit from participation in intensive cardiac rehab for nutrition, exercise, and lifestyle modificaton.                Nutrition Goals Re-Evaluation:  Nutrition Goals Re-Evaluation     Row Name 05/12/23 1147             Goals   Current Weight 167 lb 1.7 oz (75.8 kg)       Comment A1c 7.0, LDL 41, HDL 37, Lpa 151       Expected Outcome Goals in action. Niki has previously completed Intensive Cardiac Rehab in May 2024. At that time he also completed one on one nutrition counseling and consistently attended the [employer] education series. Brandonlee continues to attend the [employer] and nutrition series regularly. He has good nutrition knowledge of heart healthy diet including increased fiber, decreased saturated fat, and reduced sodium intake. He continues to work on blood sugar control and reports improved am/fasting blood sugars; most recent A1c was 7.0.. He has adopted more plant based eating and increased dietary fiber. LDL remains well controlled. He has maintained his weight since starting with our program. Corion will continue to benefit from participation in intensive cardiac  rehab for nutrition, exercise, and lifestyle modificaton.                Nutrition Goals Discharge (Final Nutrition Goals Re-Evaluation):  Nutrition Goals Re-Evaluation - 05/12/23 1147       Goals   Current Weight 167 lb 1.7 oz (75.8 kg)    Comment A1c 7.0, LDL 41, HDL 37, Lpa 151    Expected Outcome Goals in action. Deston has previously completed Intensive Cardiac Rehab in May 2024. At that time he also completed one on one nutrition counseling and consistently attended the [employer] education series. Tymel continues to attend the [employer] and nutrition series regularly. He has good nutrition knowledge of heart healthy diet including increased fiber, decreased saturated fat, and reduced sodium intake. He continues to work  on blood sugar control and reports improved am/fasting blood sugars; most recent A1c was 7.0.. He has adopted more plant based eating and increased dietary fiber. LDL remains well controlled. He has maintained his weight since starting with our program. Shiv will continue to benefit from participation in intensive cardiac rehab for nutrition, exercise, and lifestyle modificaton.             Psychosocial: Target Goals: Acknowledge presence or absence of significant depression and/or stress, maximize coping skills, provide positive support system. Participant is able to verbalize types and ability to use techniques and skills needed for reducing stress and depression.  Initial Review & Psychosocial Screening:  Initial Psych Review & Screening - 03/17/23 1117       Initial Review   Current issues with None Identified      Family Dynamics   Good Support System? Yes   Tidus has his spouse for support     Barriers   Psychosocial barriers to participate in program There are no identifiable barriers or psychosocial needs.      Screening Interventions   Interventions Encouraged to exercise             Quality of Life Scores:  Quality of Life - 03/17/23  1223       Quality of Life   Select Quality of Life      Quality of Life Scores   Health/Function Pre 27.4 %    Socioeconomic Pre 29.29 %    Psych/Spiritual Pre 27.86 %    Family Pre 28.8 %    GLOBAL Pre 28.09 %            Scores of 19 and below usually indicate a poorer quality of life in these areas.  A difference of  2-3 points is a clinically meaningful difference.  A difference of 2-3 points in the total score of the Quality of Life Index has been associated with significant improvement in overall quality of life, self-image, physical symptoms, and general health in studies assessing change in quality of life.  PHQ-9: Review Flowsheet  More data may exist      03/17/2023 07/23/2022 05/14/2022 05/10/2020 03/13/2020  Depression screen PHQ 2/9  Decreased Interest 0 0 0 0 0  Down, Depressed, Hopeless 0 0 1 0 0  PHQ - 2 Score 0 0 1 0 0  Altered sleeping 0 0 0 - -  Tired, decreased energy 0 0 1 - -  Change in appetite 0 0 0 - -  Feeling bad or failure about yourself  0 0 0 - -  Trouble concentrating 0 0 0 - -  Moving slowly or fidgety/restless 0 0 0 - -  Suicidal thoughts 0 0 0 - -  PHQ-9 Score 0 0 2 - -  Difficult doing work/chores - Not difficult at all Not difficult at all - -   Interpretation of Total Score  Total Score Depression Severity:  1-4 = Minimal depression, 5-9 = Mild depression, 10-14 = Moderate depression, 15-19 = Moderately severe depression, 20-27 = Severe depression   Psychosocial Evaluation and Intervention:   Psychosocial Re-Evaluation:  Psychosocial Re-Evaluation     Row Name 03/25/23 1413 04/16/23 1004 05/19/23 1012         Psychosocial Re-Evaluation   Current issues with None Identified None Identified None Identified     Interventions Encouraged to attend Cardiac Rehabilitation for the exercise Encouraged to attend Cardiac Rehabilitation for the exercise Encouraged to attend Cardiac Rehabilitation for the exercise  Continue Psychosocial  Services  No Follow up required No Follow up required No Follow up required              Psychosocial Discharge (Final Psychosocial Re-Evaluation):  Psychosocial Re-Evaluation - 05/19/23 1012       Psychosocial Re-Evaluation   Current issues with None Identified    Interventions Encouraged to attend Cardiac Rehabilitation for the exercise    Continue Psychosocial Services  No Follow up required             Vocational Rehabilitation: Provide vocational rehab assistance to qualifying candidates.   Vocational Rehab Evaluation & Intervention:  Vocational Rehab - 03/17/23 1118       Initial Vocational Rehab Evaluation & Intervention   Assessment shows need for Vocational Rehabilitation No   Kenroy is retired            Education: Education Goals: Education classes will be provided on a weekly basis, covering required topics. Participant will state understanding/return demonstration of topics presented.    Education     Row Name 03/25/23 1100     Education   Cardiac Education Topics Pritikin   Secondary school teacher School   Educator Nurse;Respiratory Therapist   Weekly Topic Comforting Weekend Breakfasts   Instruction Review Code 1- Verbalizes Understanding   Class Start Time 0818   Class Stop Time [phone]   Class Time Calculation (min) 40 min    Row Name 03/30/23 0800     Education   Cardiac Education Topics Pritikin   Select Core Videos     Core Videos   Educator Exercise Physiologist   Select Exercise Education   Exercise Education Improving Performance   Instruction Review Code 1- Verbalizes Understanding   Class Start Time [phone]   Class Stop Time 0850   Class Time Calculation (min) 38 min    Row Name 04/01/23 1300     Education   Cardiac Education Topics Pritikin   Secondary school teacher School   Educator Nurse;Respiratory Therapist   Weekly Topic Fast Evening Meals   Instruction Review Code 1- Verbalizes Understanding    Class Start Time [phone]   Class Stop Time 9562   Class Time Calculation (min) 36 min    Row Name 04/03/23 0900     Education   Cardiac Education Topics Pritikin   Select Workshops     Workshops   Educator Dietitian   Select Nutrition   Nutrition Workshop Fueling a Forensic psychologist   Instruction Review Code 1- Verbalizes Understanding   Class Start Time 0815   Class Stop Time 0850   Class Time Calculation (min) 35 min    Row Name 04/06/23 1100     Education   Cardiac Education Topics Pritikin   Select Workshops     Workshops   Educator Exercise Physiologist   Select Psychosocial   Psychosocial Workshop Healthy Sleep for a Healthy Heart   Instruction Review Code 1- Verbalizes Understanding   Class Start Time [phone]   Class Stop Time 0857   Class Time Calculation (min) 43 min    Row Name 04/08/23 1200     Education   Cardiac Education Topics Pritikin   Designer, jewellery   Weekly Topic International Cuisine- Spotlight on the [facility] Zones   Instruction Review Code 1- Verbalizes Understanding   Class Start Time 0815   Class Stop Time [phone]  Class Time Calculation (min) 36 min    Row Name 04/10/23 1300     Education   Cardiac Education Topics Pritikin   Select Core Videos     Core Videos   Educator Exercise Physiologist   Select Psychosocial   Psychosocial How Our Thoughts Can Heal Our Hearts   Instruction Review Code 1- Verbalizes Understanding   Class Start Time [phone]   Class Stop Time 0846   Class Time Calculation (min) 32 min    Row Name 04/13/23 0800     Education   Cardiac Education Topics Pritikin   Select Workshops     Workshops   Educator Nurse   Select Exercise   Exercise Workshop Managing Heart Disease: Your Path to a Healthier Heart   Instruction Review Code 1- Verbalizes Understanding   Class Start Time [phone]   Class Stop Time 0846   Class Time Calculation (min) 35 min    Row Name  04/15/23 1000     Education   Cardiac Education Topics Pritikin   Secondary school teacher School   Educator Dietitian;Respiratory Therapist   Weekly Topic Simple Sides and Sauces   Instruction Review Code 1- Verbalizes Understanding   Class Start Time 0815   Class Stop Time 0847   Class Time Calculation (min) 32 min    Row Name 04/17/23 0800     Education   Cardiac Education Topics Pritikin   Select Core Videos     Core Videos   Educator Exercise Physiologist   Select General Education   General Education Hypertension and Heart Disease   Instruction Review Code 1- Verbalizes Understanding   Class Start Time 0815   Class Stop Time 0849   Class Time Calculation (min) 34 min    Row Name 04/20/23 0800     Education   Select Workshops     Workshops   Educator Exercise Clinical cytogeneticist Psychosocial   Psychosocial Workshop From [employer] to Heart: The Power of a Healthy Outlook   Instruction Review Code 1- Verbalizes Understanding   Class Start Time 0815   Class Stop Time 0903   Class Time Calculation (min) 48 min    Row Name 04/22/23 0900     Education   Cardiac Education Topics Pritikin   Secondary school teacher School   Educator Respiratory Therapist;Nurse   Weekly Topic Powerhouse Plant-Based Proteins   Instruction Review Code 1- Verbalizes Understanding   Class Start Time 0815   Class Stop Time 0847   Class Time Calculation (min) 32 min    Row Name 04/24/23 0800     Education   Cardiac Education Topics Pritikin   Select Core Videos     Core Videos   Educator Nurse   Select General Education   General Education Heart Disease Risk Reduction   Instruction Review Code 1- Verbalizes Understanding   Class Start Time 0813   Class Stop Time 0846   Class Time Calculation (min) 33 min    Row Name 04/27/23 0900     Education   Cardiac Education Topics Pritikin   Select Workshops     Workshops   Educator Exercise Physiologist    Select Exercise   Exercise Workshop Location manager and Fall Prevention   Instruction Review Code 1- Verbalizes Understanding   Class Start Time [phone]   Class Stop Time 0848   Class Time Calculation (min) 34 min    Row Name 04/29/23 0900  Education   Cardiac Education Topics Pritikin   Secondary school teacher School   Educator Respiratory Therapist;Nurse   Weekly Topic Adding Flavor - Sodium-Free   Instruction Review Code 1- Verbalizes Understanding   Class Start Time 0815   Class Stop Time 0851   Class Time Calculation (min) 36 min    Row Name 05/01/23 0800     Education   Cardiac Education Topics Pritikin   Select Core Videos     Core Videos   Educator Dietitian   Select Nutrition   Nutrition Overview of the Pritikin Eating Plan   Instruction Review Code 1- Verbalizes Understanding   Class Start Time 0815   Class Stop Time 0900   Class Time Calculation (min) 45 min    Row Name 05/04/23 0900     Education   Cardiac Education Topics Pritikin   Select Core Videos     Core Videos   Educator Exercise Physiologist   Select Psychosocial   Psychosocial Healthy Minds, Bodies, Hearts   Instruction Review Code 1- Verbalizes Understanding   Class Start Time 0813   Class Stop Time 0847   Class Time Calculation (min) 34 min    Row Name 05/06/23 0900     Education   Cardiac Education Topics Pritikin   Secondary school teacher School   Educator Respiratory Therapist;Nurse   Weekly Topic Fast and Healthy Breakfasts   Instruction Review Code 1- Verbalizes Understanding   Class Start Time [phone]   Class Stop Time 0844   Class Time Calculation (min) 32 min    Row Name 05/08/23 0900     Education   Cardiac Education Topics Pritikin   Select Core Videos     Core Videos   Educator Dietitian   Select Nutrition   Nutrition Other  Label Reading   Instruction Review Code 1- Verbalizes Understanding   Class Start Time 0815   Class Stop Time 0853    Class Time Calculation (min) 38 min    Row Name 05/11/23 0900     Education   Cardiac Education Topics Pritikin   Multimedia programmer School     Core Videos   Educator Nurse   Select Nutrition   Nutrition Becoming a Pritikin Chef   Instruction Review Code 1- Verbalizes Understanding   Class Start Time 0815   Class Stop Time 0856   Class Time Calculation (min) 41 min    Row Name 05/13/23 0900     Education   Cardiac Education Topics Pritikin   Secondary school teacher School   Educator Dietitian;Respiratory Therapist   Weekly Topic Personalizing Your Pritikin Plate   Instruction Review Code 1- Verbalizes Understanding   Class Start Time 0815   Class Stop Time 0848   Class Time Calculation (min) 33 min    Row Name 05/15/23 1100     Education   Cardiac Education Topics Pritikin   Glass blower/designer Nutrition   Nutrition Workshop Label Reading   Instruction Review Code 1- Verbalizes Understanding   Class Start Time 0815   Class Stop Time 0900   Class Time Calculation (min) 45 min    Row Name 05/18/23 1000     Education   Cardiac Education Topics Pritikin   Geographical information systems officer Psychosocial   Psychosocial Workshop  Recognizing and Reducing Stress   Instruction Review Code 1- Verbalizes Understanding   Class Start Time 0815   Class Stop Time 0858   Class Time Calculation (min) 43 min            Core Videos: Exercise    Move It!  Clinical staff conducted group or individual video education with verbal and written material and guidebook.  Patient learns the recommended Pritikin exercise program. Exercise with the goal of living a long, healthy life. Some of the health benefits of exercise include controlled diabetes, healthier blood pressure levels, improved cholesterol levels, improved heart and lung capacity, improved sleep, and better body composition.  Everyone should speak with their doctor before starting or changing an exercise routine.  Biomechanical Limitations Clinical staff conducted group or individual video education with verbal and written material and guidebook.  Patient learns how biomechanical limitations can impact exercise and how we can mitigate and possibly overcome limitations to have an impactful and balanced exercise routine.  Body Composition Clinical staff conducted group or individual video education with verbal and written material and guidebook.  Patient learns that body composition (ratio of muscle mass to fat mass) is a key component to assessing overall fitness, rather than body weight alone. Increased fat mass, especially visceral belly fat, can put [place] at increased risk for metabolic syndrome, type 2 diabetes, heart disease, and even death. It is recommended to combine diet and exercise (cardiovascular and resistance training) to improve your body composition. Seek guidance from your physician and exercise physiologist before implementing an exercise routine.  Exercise Action Plan Clinical staff conducted group or individual video education with verbal and written material and guidebook.  Patient learns the recommended strategies to achieve and enjoy long-term exercise adherence, including variety, self-motivation, self-efficacy, and positive decision making. Benefits of exercise include fitness, good health, weight management, more energy, better sleep, less stress, and overall well-being.  Medical   Heart Disease Risk Reduction Clinical staff conducted group or individual video education with verbal and written material and guidebook.  Patient learns our heart is our most vital organ as it circulates oxygen, nutrients, white blood cells, and hormones throughout the entire body, and carries waste away. Data supports a plant-based eating plan like the Pritikin Program for its effectiveness in slowing progression of and  reversing heart disease. The video provides a number of recommendations to address heart disease.   Metabolic Syndrome and Belly Fat  Clinical staff conducted group or individual video education with verbal and written material and guidebook.  Patient learns what metabolic syndrome is, how it leads to heart disease, and how one can reverse it and keep it from coming back. You have metabolic syndrome if you have 3 of the following 5 criteria: abdominal obesity, high blood pressure, high triglycerides, low HDL cholesterol, and high blood sugar.  Hypertension and Heart Disease Clinical staff conducted group or individual video education with verbal and written material and guidebook.  Patient learns that high blood pressure, or hypertension, is very common in the [place]. Hypertension is largely due to excessive salt intake, but other important risk factors include being overweight, physical inactivity, drinking too much alcohol, smoking, and not eating enough potassium from fruits and vegetables. High blood pressure is a leading risk factor for heart attack, stroke, congestive heart failure, dementia, kidney failure, and premature death. Long-term effects of excessive salt intake include stiffening of the arteries and thickening of heart muscle and organ damage. Recommendations include ways to reduce hypertension  and the risk of heart disease.  Diseases of Our Time - Focusing on Diabetes Clinical staff conducted group or individual video education with verbal and written material and guidebook.  Patient learns why the best way to stop diseases of our time is prevention, through food and other lifestyle changes. Medicine (such as prescription pills and surgeries) is often only a Band-Aid on the problem, not a long-term solution. Most common diseases of our time include obesity, type 2 diabetes, hypertension, heart disease, and cancer. The Pritikin Program is recommended and has been proven to help  reduce, reverse, and/or prevent the damaging effects of metabolic syndrome.  Nutrition   Overview of the Pritikin Eating Plan  Clinical staff conducted group or individual video education with verbal and written material and guidebook.  Patient learns about the Pritikin Eating Plan for disease risk reduction. The Pritikin Eating Plan emphasizes a wide variety of unrefined, minimally-processed carbohydrates, like fruits, vegetables, whole grains, and legumes. Go, Caution, and Stop food choices are explained. Plant-based and lean animal proteins are emphasized. Rationale provided for low sodium intake for blood pressure control, low added sugars for blood sugar stabilization, and low added fats and oils for coronary artery disease risk reduction and weight management.  Calorie Density  Clinical staff conducted group or individual video education with verbal and written material and guidebook.  Patient learns about calorie density and how it impacts the Pritikin Eating Plan. Knowing the characteristics of the food you choose will help you decide whether those foods will lead to weight gain or weight loss, and whether you want to consume more or less of them. Weight loss is usually a side effect of the Pritikin Eating Plan because of its focus on low calorie-dense foods.  Label Reading  Clinical staff conducted group or individual video education with verbal and written material and guidebook.  Patient learns about the Pritikin recommended label reading guidelines and corresponding recommendations regarding calorie density, added sugars, sodium content, and whole grains.  Dining Out - Part 1  Clinical staff conducted group or individual video education with verbal and written material and guidebook.  Patient learns that restaurant meals can be sabotaging because they can be so high in calories, fat, sodium, and/or sugar. Patient learns recommended strategies on how to positively address this and avoid  unhealthy pitfalls.  Facts on Fats  Clinical staff conducted group or individual video education with verbal and written material and guidebook.  Patient learns that lifestyle modifications can be just as effective, if not more so, as many medications for lowering your risk of heart disease. A Pritikin lifestyle can help to reduce your risk of inflammation and atherosclerosis (cholesterol build-up, or plaque, in the artery walls). Lifestyle interventions such as dietary choices and physical activity address the cause of atherosclerosis. A review of the types of fats and their impact on blood cholesterol levels, along with dietary recommendations to reduce fat intake is also included.  Nutrition Action Plan  Clinical staff conducted group or individual video education with verbal and written material and guidebook.  Patient learns how to incorporate Pritikin recommendations into their lifestyle. Recommendations include planning and keeping personal health goals in mind as an important part of their success.  Healthy Mind-Set    Healthy Minds, Bodies, Hearts  Clinical staff conducted group or individual video education with verbal and written material and guidebook.  Patient learns how to identify when they are stressed. Video will discuss the impact of that stress, as well as the  many benefits of stress management. Patient will also be introduced to stress management techniques. The way we think, act, and feel has an impact on our hearts.  How Our Thoughts Can Heal Our Hearts  Clinical staff conducted group or individual video education with verbal and written material and guidebook.  Patient learns that negative thoughts can cause depression and anxiety. This can result in negative lifestyle behavior and serious health problems. Cognitive behavioral therapy is an effective method to help control our thoughts in order to change and improve our emotional outlook.  Additional Videos:  Exercise     Improving Performance  Clinical staff conducted group or individual video education with verbal and written material and guidebook.  Patient learns to use a non-linear approach by alternating intensity levels and lengths of time spent exercising to help burn more calories and lose more body fat. Cardiovascular exercise helps improve heart health, metabolism, hormonal balance, blood sugar control, and recovery from fatigue. Resistance training improves strength, endurance, balance, coordination, reaction time, metabolism, and muscle mass. Flexibility exercise improves circulation, posture, and balance. Seek guidance from your physician and exercise physiologist before implementing an exercise routine and learn your capabilities and proper form for all exercise.  Introduction to Yoga  Clinical staff conducted group or individual video education with verbal and written material and guidebook.  Patient learns about yoga, a discipline of the coming together of mind, breath, and body. The benefits of yoga include improved flexibility, improved range of motion, better posture and core strength, increased lung function, weight loss, and positive self-image. Yoga's heart health benefits include lowered blood pressure, healthier heart rate, decreased cholesterol and triglyceride levels, improved immune function, and reduced stress. Seek guidance from your physician and exercise physiologist before implementing an exercise routine and learn your capabilities and proper form for all exercise.  Medical   Aging: Enhancing Your Quality of Life  Clinical staff conducted group or individual video education with verbal and written material and guidebook.  Patient learns key strategies and recommendations to stay in good physical health and enhance quality of life, such as prevention strategies, having an advocate, securing a Health Care Proxy and Power of Attorney, and keeping a list of medications and system for  tracking them. It also discusses how to avoid risk for bone loss.  Biology of Weight Control  Clinical staff conducted group or individual video education with verbal and written material and guidebook.  Patient learns that weight gain occurs because we consume more calories than we burn (eating more, moving less). Even if your body weight is normal, you may have higher ratios of fat compared to muscle mass. Too much body fat puts you at increased risk for cardiovascular disease, heart attack, stroke, type 2 diabetes, and obesity-related cancers. In addition to exercise, following the Pritikin Eating Plan can help reduce your risk.  Decoding Lab Results  Clinical staff conducted group or individual video education with verbal and written material and guidebook.  Patient learns that lab test reflects one measurement whose values change over time and are influenced by many factors, including medication, stress, sleep, exercise, food, hydration, pre-existing medical conditions, and more. It is recommended to use the knowledge from this video to become more involved with your lab results and evaluate your numbers to speak with your doctor.   Diseases of Our Time - Overview  Clinical staff conducted group or individual video education with verbal and written material and guidebook.  Patient learns that according to the CDC, 50%  to 70% of chronic diseases (such as obesity, type 2 diabetes, elevated lipids, hypertension, and heart disease) are avoidable through lifestyle improvements including healthier food choices, listening to satiety cues, and increased physical activity.  Sleep Disorders Clinical staff conducted group or individual video education with verbal and written material and guidebook.  Patient learns how good quality and duration of sleep are important to overall health and well-being. Patient also learns about sleep disorders and how they impact health along with recommendations to address  them, including discussing with a physician.  Nutrition  Dining Out - Part 2 Clinical staff conducted group or individual video education with verbal and written material and guidebook.  Patient learns how to plan ahead and communicate in order to maximize their dining experience in a healthy and nutritious manner. Included are recommended food choices based on the type of restaurant the patient is visiting.   Fueling a Banker conducted group or individual video education with verbal and written material and guidebook.  There is a strong connection between our food choices and our health. Diseases like obesity and type 2 diabetes are very prevalent and are in large-part due to lifestyle choices. The Pritikin Eating Plan provides plenty of food and hunger-curbing satisfaction. It is easy to follow, affordable, and helps reduce health risks.  Menu Workshop  Clinical staff conducted group or individual video education with verbal and written material and guidebook.  Patient learns that restaurant meals can sabotage health goals because they are often packed with calories, fat, sodium, and sugar. Recommendations include strategies to plan ahead and to communicate with the manager, chef, or server to help order a healthier meal.  Planning Your Eating Strategy  Clinical staff conducted group or individual video education with verbal and written material and guidebook.  Patient learns about the Pritikin Eating Plan and its benefit of reducing the risk of disease. The Pritikin Eating Plan does not focus on calories. Instead, it emphasizes high-quality, nutrient-rich foods. By knowing the characteristics of the foods, we choose, we can determine their calorie density and make informed decisions.  Targeting Your Nutrition Priorities  Clinical staff conducted group or individual video education with verbal and written material and guidebook.  Patient learns that lifestyle habits have  a tremendous impact on disease risk and progression. This video provides eating and physical activity recommendations based on your personal health goals, such as reducing LDL cholesterol, losing weight, preventing or controlling type 2 diabetes, and reducing high blood pressure.  Vitamins and Minerals  Clinical staff conducted group or individual video education with verbal and written material and guidebook.  Patient learns different ways to obtain key vitamins and minerals, including through a recommended healthy diet. It is important to discuss all supplements you take with your doctor.   Healthy Mind-Set    Smoking Cessation  Clinical staff conducted group or individual video education with verbal and written material and guidebook.  Patient learns that cigarette smoking and tobacco addiction pose a serious health risk which affects millions of people. Stopping smoking will significantly reduce the risk of heart disease, lung disease, and many forms of cancer. Recommended strategies for quitting are covered, including working with your doctor to develop a successful plan.  Culinary   Becoming a Set designer conducted group or individual video education with verbal and written material and guidebook.  Patient learns that cooking at home can be healthy, cost-effective, quick, and puts them in control. Keys to cooking healthy  recipes will include looking at your recipe, assessing your equipment needs, planning ahead, making it simple, choosing cost-effective seasonal ingredients, and limiting the use of added fats, salts, and sugars.  Cooking - Breakfast and Snacks  Clinical staff conducted group or individual video education with verbal and written material and guidebook.  Patient learns how important breakfast is to satiety and nutrition through the entire day. Recommendations include key foods to eat during breakfast to help stabilize blood sugar levels and to prevent  overeating at meals later in the day. Planning ahead is also a key component.  Cooking - Educational psychologist conducted group or individual video education with verbal and written material and guidebook.  Patient learns eating strategies to improve overall health, including an approach to cook more at home. Recommendations include thinking of animal protein as a side on your plate rather than center stage and focusing instead on lower calorie dense options like vegetables, fruits, whole grains, and plant-based proteins, such as beans. Making sauces in large quantities to freeze for later and leaving the skin on your vegetables are also recommended to maximize your experience.  Cooking - Healthy Salads and Dressing Clinical staff conducted group or individual video education with verbal and written material and guidebook.  Patient learns that vegetables, fruits, whole grains, and legumes are the foundations of the Pritikin Eating Plan. Recommendations include how to incorporate each of these in flavorful and healthy salads, and how to create homemade salad dressings. Proper handling of ingredients is also covered. Cooking - Soups and [employer] - Soups and Desserts Clinical staff conducted group or individual video education with verbal and written material and guidebook.  Patient learns that Pritikin soups and desserts make for easy, nutritious, and delicious snacks and meal components that are low in sodium, fat, sugar, and calorie density, while high in vitamins, minerals, and filling fiber. Recommendations include simple and healthy ideas for soups and desserts.   Overview     The Pritikin Solution Program Overview Clinical staff conducted group or individual video education with verbal and written material and guidebook.  Patient learns that the results of the Pritikin Program have been documented in more than 100 articles published in peer-reviewed journals, and the benefits  include reducing risk factors for (and, in some cases, even reversing) high cholesterol, high blood pressure, type 2 diabetes, obesity, and more! An overview of the three key pillars of the Pritikin Program will be covered: eating well, doing regular exercise, and having a healthy mind-set.  WORKSHOPS  Exercise: Exercise Basics: Building Your Action Plan Clinical staff led group instruction and group discussion with PowerPoint presentation and patient guidebook. To enhance the learning environment the use of posters, models and videos may be added. At the conclusion of this workshop, patients will comprehend the difference between physical activity and exercise, as well as the benefits of incorporating both, into their routine. Patients will understand the FITT (Frequency, Intensity, Time, and Type) principle and how to use it to build an exercise action plan. In addition, safety concerns and other considerations for exercise and cardiac rehab will be addressed by the presenter. The purpose of this lesson is to promote a comprehensive and effective weekly exercise routine in order to improve patients' overall level of fitness.   Managing Heart Disease: Your Path to a Healthier Heart Clinical staff led group instruction and group discussion with PowerPoint presentation and patient guidebook. To enhance the learning environment the use of posters, models and  videos may be added.At the conclusion of this workshop, patients will understand the anatomy and physiology of the heart. Additionally, they will understand how Pritikin's three pillars impact the risk factors, the progression, and the management of heart disease.  The purpose of this lesson is to provide a high-level overview of the heart, heart disease, and how the Pritikin lifestyle positively impacts risk factors.  Exercise Biomechanics Clinical staff led group instruction and group discussion with PowerPoint presentation and patient  guidebook. To enhance the learning environment the use of posters, models and videos may be added. Patients will learn how the structural parts of their bodies function and how these functions impact their daily activities, movement, and exercise. Patients will learn how to promote a neutral spine, learn how to manage pain, and identify ways to improve their physical movement in order to promote healthy living. The purpose of this lesson is to expose patients to common physical limitations that impact physical activity. Participants will learn practical ways to adapt and manage aches and pains, and to minimize their effect on regular exercise. Patients will learn how to maintain good posture while sitting, walking, and lifting.  Balance Training and Fall Prevention  Clinical staff led group instruction and group discussion with PowerPoint presentation and patient guidebook. To enhance the learning environment the use of posters, models and videos may be added. At the conclusion of this workshop, patients will understand the importance of their sensorimotor skills (vision, proprioception, and the vestibular system) in maintaining their ability to balance as they age. Patients will apply a variety of balancing exercises that are appropriate for their current level of function. Patients will understand the common causes for poor balance, possible solutions to these problems, and ways to modify their physical environment in order to minimize their fall risk. The purpose of this lesson is to teach patients about the importance of maintaining balance as they age and ways to minimize their risk of falling.  WORKSHOPS   Nutrition:  Fueling a Ship broker led group instruction and group discussion with PowerPoint presentation and patient guidebook. To enhance the learning environment the use of posters, models and videos may be added. Patients will review the foundational principles of the  Pritikin Eating Plan and understand what constitutes a serving size in each of the food groups. Patients will also learn Pritikin-friendly foods that are better choices when away from home and review make-ahead meal and snack options. Calorie density will be reviewed and applied to three nutrition priorities: weight maintenance, weight loss, and weight gain. The purpose of this lesson is to reinforce (in a group setting) the key concepts around what patients are recommended to eat and how to apply these guidelines when away from home by planning and selecting Pritikin-friendly options. Patients will understand how calorie density may be adjusted for different weight management goals.  Mindful Eating  Clinical staff led group instruction and group discussion with PowerPoint presentation and patient guidebook. To enhance the learning environment the use of posters, models and videos may be added. Patients will briefly review the concepts of the Pritikin Eating Plan and the importance of low-calorie dense foods. The concept of mindful eating will be introduced as well as the importance of paying attention to internal hunger signals. Triggers for non-hunger eating and techniques for dealing with triggers will be explored. The purpose of this lesson is to provide patients with the opportunity to review the basic principles of the Pritikin Eating Plan, discuss the value of  eating mindfully and how to measure internal cues of hunger and fullness using the Hunger Scale. Patients will also discuss reasons for non-hunger eating and learn strategies to use for controlling emotional eating.  Targeting Your Nutrition Priorities Clinical staff led group instruction and group discussion with PowerPoint presentation and patient guidebook. To enhance the learning environment the use of posters, models and videos may be added. Patients will learn how to determine their genetic susceptibility to disease by reviewing their  family history. Patients will gain insight into the importance of diet as part of an overall healthy lifestyle in mitigating the impact of genetics and other environmental insults. The purpose of this lesson is to provide patients with the opportunity to assess their personal nutrition priorities by looking at their family history, their own health history and current risk factors. Patients will also be able to discuss ways of prioritizing and modifying the Pritikin Eating Plan for their highest risk areas  Menu  Clinical staff led group instruction and group discussion with PowerPoint presentation and patient guidebook. To enhance the learning environment the use of posters, models and videos may be added. Using menus brought in from [employer], or printed from [employer], patients will apply the Pritikin dining out guidelines that were presented in the [employer] video. Patients will also be able to practice these guidelines in a variety of provided scenarios. The purpose of this lesson is to provide patients with the opportunity to practice hands-on learning of the Pritikin Dining Out guidelines with actual menus and practice scenarios.  Label Reading Clinical staff led group instruction and group discussion with PowerPoint presentation and patient guidebook. To enhance the learning environment the use of posters, models and videos may be added. Patients will review and discuss the Pritikin label reading guidelines presented in Pritikin's Label Reading Educational series video. Using fool labels brought in from local grocery stores and markets, patients will apply the label reading guidelines and determine if the packaged food meet the Pritikin guidelines. The purpose of this lesson is to provide patients with the opportunity to review, discuss, and practice hands-on learning of the Pritikin Label Reading guidelines with actual packaged food labels. Cooking School  Pritikin's  [employer] are designed to teach patients ways to prepare quick, simple, and affordable recipes at home. The importance of nutrition's role in chronic disease risk reduction is reflected in its emphasis in the overall Pritikin program. By learning how to prepare essential core Pritikin Eating Plan recipes, patients will increase control over what they eat; be able to customize the flavor of foods without the use of added salt, sugar, or fat; and improve the quality of the food they consume. By learning a set of core recipes which are easily assembled, quickly prepared, and affordable, patients are more likely to prepare more healthy foods at home. These workshops focus on convenient breakfasts, simple entres, side dishes, and desserts which can be prepared with minimal effort and are consistent with nutrition recommendations for cardiovascular risk reduction. Cooking [employer] are taught by a Armed forces logistics/support/administrative officer (RD) who has been trained by the [employer]. The chef or RD has a clear understanding of the importance of minimizing - if not completely eliminating - added fat, sugar, and sodium in recipes. Throughout the series of Cooking School Workshop sessions, patients will learn about healthy ingredients and efficient methods of cooking to build confidence in their capability to prepare    [employer] weekly  topics:  Adding Flavor- Sodium-Free  Fast and Healthy Breakfasts  Powerhouse Plant-Based Proteins  Satisfying Salads and Dressings  Simple Sides and Sauces  International Cuisine-Spotlight on the Blue Zones  Delicious Desserts  Savory Soups  Efficiency Cooking - Meals in a Snap  Tasty Appetizers and Snacks  Comforting Weekend Breakfasts  One-Pot Wonders   Fast Evening Meals  Landscape architect Your Pritikin Plate  WORKSHOPS   Healthy Mindset (Psychosocial):  Focused Goals, Sustainable Changes Clinical staff led group  instruction and group discussion with PowerPoint presentation and patient guidebook. To enhance the learning environment the use of posters, models and videos may be added. Patients will be able to apply effective goal setting strategies to establish at least one personal goal, and then take consistent, meaningful action toward that goal. They will learn to identify common barriers to achieving personal goals and develop strategies to overcome them. Patients will also gain an understanding of how our mind-set can impact our ability to achieve goals and the importance of cultivating a positive and growth-oriented mind-set. The purpose of this lesson is to provide patients with a deeper understanding of how to set and achieve personal goals, as well as the tools and strategies needed to overcome common obstacles which may arise along the way.  From Head to Heart: The Power of a Healthy Outlook  Clinical staff led group instruction and group discussion with PowerPoint presentation and patient guidebook. To enhance the learning environment the use of posters, models and videos may be added. Patients will be able to recognize and describe the impact of emotions and mood on physical health. They will discover the importance of self-care and explore self-care practices which may work for them. Patients will also learn how to utilize the 4 C's to cultivate a healthier outlook and better manage stress and challenges. The purpose of this lesson is to demonstrate to patients how a healthy outlook is an essential part of maintaining good health, especially as they continue their cardiac rehab journey.  Healthy Sleep for a Healthy Heart Clinical staff led group instruction and group discussion with PowerPoint presentation and patient guidebook. To enhance the learning environment the use of posters, models and videos may be added. At the conclusion of this workshop, patients will be able to demonstrate knowledge of the  importance of sleep to overall health, well-being, and quality of life. They will understand the symptoms of, and treatments for, common sleep disorders. Patients will also be able to identify daytime and nighttime behaviors which impact sleep, and they will be able to apply these tools to help manage sleep-related challenges. The purpose of this lesson is to provide patients with a general overview of sleep and outline the importance of quality sleep. Patients will learn about a few of the most common sleep disorders. Patients will also be introduced to the concept of "sleep hygiene," and discover ways to self-manage certain sleeping problems through simple daily behavior changes. Finally, the workshop will motivate patients by clarifying the links between quality sleep and their goals of heart-healthy living.   Recognizing and Reducing Stress Clinical staff led group instruction and group discussion with PowerPoint presentation and patient guidebook. To enhance the learning environment the use of posters, models and videos may be added. At the conclusion of this workshop, patients will be able to understand the types of stress reactions, differentiate between acute and chronic stress, and recognize the impact that chronic stress has on their health. They will also be able  to apply different coping mechanisms, such as reframing negative self-talk. Patients will have the opportunity to practice a variety of stress management techniques, such as deep abdominal breathing, progressive muscle relaxation, and/or guided imagery.  The purpose of this lesson is to educate patients on the role of stress in their lives and to provide healthy techniques for coping with it.  Learning Barriers/Preferences:  Learning Barriers/Preferences - 03/17/23 1224       Learning Barriers/Preferences   Learning Barriers Hearing;Sight   wears glasses and bolateral hearing aids   Learning Preferences Computer/Internet;Skilled  Demonstration;Written Material;Individual Instruction;Group Instruction             Education Topics:  Knowledge Questionnaire Score:  Knowledge Questionnaire Score - 03/17/23 1225       Knowledge Questionnaire Score   Pre Score 24/24             Core Components/Risk Factors/Patient Goals at Admission:  Personal Goals and Risk Factors at Admission - 03/17/23 1119       Core Components/Risk Factors/Patient Goals on Admission    Weight Management Weight Maintenance    Diabetes Yes    Intervention Provide education about signs/symptoms and action to take for hypo/hyperglycemia.;Provide education about proper nutrition, including hydration, and aerobic/resistive exercise prescription along with prescribed medications to achieve blood glucose in normal ranges: Fasting glucose 65-99 mg/dL    Expected Outcomes Short Term: Participant verbalizes understanding of the signs/symptoms and immediate care of hyper/hypoglycemia, proper foot care and importance of medication, aerobic/resistive exercise and nutrition plan for blood glucose control.;Long Term: Attainment of HbA1C < 7%.    Hypertension Yes    Intervention Provide education on lifestyle modifcations including regular physical activity/exercise, weight management, moderate sodium restriction and increased consumption of fresh fruit, vegetables, and low fat dairy, alcohol moderation, and smoking cessation.;Monitor prescription use compliance.    Expected Outcomes Short Term: Continued assessment and intervention until BP is < 140/37mm HG in hypertensive participants. < 130/61mm HG in hypertensive participants with diabetes, heart failure or chronic kidney disease.;Long Term: Maintenance of blood pressure at goal levels.    Lipids Yes    Intervention Provide education and support for participant on nutrition & aerobic/resistive exercise along with prescribed medications to achieve LDL 70mg , HDL >40mg .    Expected Outcomes Short Term:  Participant states understanding of desired cholesterol values and is compliant with medications prescribed. Participant is following exercise prescription and nutrition guidelines.;Long Term: Cholesterol controlled with medications as prescribed, with individualized exercise RX and with personalized nutrition plan. Value goals: LDL < 70mg , HDL > 40 mg.             Core Components/Risk Factors/Patient Goals Review:   Goals and Risk Factor Review     Row Name 03/25/23 1414 04/16/23 1004 05/19/23 1014         Core Components/Risk Factors/Patient Goals Review   Personal Goals Review Weight Management/Obesity;Hypertension;Lipids;Diabetes Weight Management/Obesity;Hypertension;Lipids;Diabetes Weight Management/Obesity;Hypertension;Lipids;Diabetes     Review Karla started cardiac rehab on 03/25/23. Admiral did well with exercise. Vital signs and CBG's were stable. Artemio is doing well with exercise at cardiac rehab. Vital signs and CBG's have been stable. Rohan  continues to do well with exercise at cardiac rehab. Vital signs and CBG's  remain stable.     Expected Outcomes Fuad will continue to particpate in cardiac rehab for exercise, nutrtion and lifestyle modifications Lejon will continue to particpate in cardiac rehab for exercise, nutrtion and lifestyle modifications Daishaun will continue to particpate in cardiac rehab for exercise,  nutrtion and lifestyle modifications              Core Components/Risk Factors/Patient Goals at Discharge (Final Review):   Goals and Risk Factor Review - 05/19/23 1014       Core Components/Risk Factors/Patient Goals Review   Personal Goals Review Weight Management/Obesity;Hypertension;Lipids;Diabetes    Review Iker  continues to do well with exercise at cardiac rehab. Vital signs and CBG's  remain stable.    Expected Outcomes Elward will continue to particpate in cardiac rehab for exercise, nutrtion and lifestyle modifications             ITP Comments:  ITP  Comments     Row Name 03/17/23 1051 03/25/23 0842 04/16/23 1002 05/19/23 1009     ITP Comments Armanda Magic, MD: Medical Director.  Introduction to the Pritikin Education Program/Intensive Cardiac Rehab.  Initial orientation packet reviewed with the patient. 30 Day ITP Review. Emoni started cardiac rehab on 03/25/23. Toriano did well with exercise. 30 Day ITP Review. Gardner has good attendance and participation  with exercise at cardiac rehab. 30 Day ITP Review. Kamarii continues to  good attendance and participation  with exercise at cardiac rehab.             Comments: See ITP comments.Thayer Headings RN BSN

## 2023-05-20 ENCOUNTER — Encounter (HOSPITAL_COMMUNITY)
Admission: RE | Admit: 2023-05-20 | Discharge: 2023-05-20 | Disposition: A | Discharge status: Home / Self Care | Payer: Medicare Other | Source: Ambulatory Visit | Attending: Cardiology

## 2023-05-20 DIAGNOSIS — Z955 Presence of coronary angioplasty implant and graft: Secondary | ICD-10-CM | POA: Diagnosis not present

## 2023-05-20 DIAGNOSIS — Z48812 Encounter for surgical aftercare following surgery on the circulatory system: Secondary | ICD-10-CM | POA: Diagnosis not present

## 2023-05-22 ENCOUNTER — Encounter (HOSPITAL_COMMUNITY)
Admission: RE | Admit: 2023-05-22 | Discharge: 2023-05-22 | Disposition: A | Discharge status: Home / Self Care | Payer: Medicare Other | Source: Ambulatory Visit | Attending: Cardiology | Admitting: Cardiology

## 2023-05-22 ENCOUNTER — Telehealth (HOSPITAL_COMMUNITY): Payer: Self-pay

## 2023-05-22 DIAGNOSIS — Z48812 Encounter for surgical aftercare following surgery on the circulatory system: Secondary | ICD-10-CM | POA: Diagnosis not present

## 2023-05-22 DIAGNOSIS — Z955 Presence of coronary angioplasty implant and graft: Secondary | ICD-10-CM

## 2023-05-22 NOTE — Telephone Encounter (Signed)
-----   Message from Olga Millers sent at 05/22/2023  2:19 PM EST ----- Regarding: RE: THR increase Ok with me Olga Millers ----- Message ----- From: Lorin Picket Sent: 05/22/2023   2:16 PM EST To: Cammy Copa, RN; Lewayne Bunting, MD Subject: THR increase                                   Good afternoon Dr. Jens Som,  Your patient, Steven Lawrence, has been exercising here in the [facility] program and is doing well. He would like to work harder, especially on the rowing machine, but exceeds his [facility]. His current range is 59-118 (80%). I would like to increase his THHR; 85% would be 126, and 90% would be 133. I will leave that to your discretion. Blood pressures have ranged from 142-162/68-84 with exercise. Thank you for considering this request. Please let me know if you require any additional information.   Best Regards,   Lorin Picket MS, ACSM-CEP, CCRP

## 2023-05-25 ENCOUNTER — Encounter (HOSPITAL_COMMUNITY)
Admission: RE | Admit: 2023-05-25 | Discharge: 2023-05-25 | Disposition: A | Discharge status: Home / Self Care | Payer: Medicare Other | Source: Ambulatory Visit | Attending: Cardiology

## 2023-05-25 DIAGNOSIS — Z955 Presence of coronary angioplasty implant and graft: Secondary | ICD-10-CM

## 2023-05-25 DIAGNOSIS — Z48812 Encounter for surgical aftercare following surgery on the circulatory system: Secondary | ICD-10-CM | POA: Diagnosis not present

## 2023-05-25 DIAGNOSIS — E113293 Type 2 diabetes mellitus with mild nonproliferative diabetic retinopathy without macular edema, bilateral: Secondary | ICD-10-CM | POA: Diagnosis not present

## 2023-05-25 DIAGNOSIS — I700 Atherosclerosis of aorta: Secondary | ICD-10-CM | POA: Diagnosis not present

## 2023-05-25 DIAGNOSIS — E1169 Type 2 diabetes mellitus with other specified complication: Secondary | ICD-10-CM | POA: Diagnosis not present

## 2023-05-25 DIAGNOSIS — I2510 Atherosclerotic heart disease of native coronary artery without angina pectoris: Secondary | ICD-10-CM | POA: Diagnosis not present

## 2023-05-26 ENCOUNTER — Ambulatory Visit: Payer: Medicare Other | Attending: Nurse Practitioner | Admitting: Nurse Practitioner

## 2023-05-26 ENCOUNTER — Encounter: Payer: Self-pay | Admitting: Nurse Practitioner

## 2023-05-26 VITALS — BP 114/60 | HR 70 | Ht 69.0 in | Wt 167.4 lb

## 2023-05-26 DIAGNOSIS — I4891 Unspecified atrial fibrillation: Secondary | ICD-10-CM | POA: Diagnosis not present

## 2023-05-26 DIAGNOSIS — Z952 Presence of prosthetic heart valve: Secondary | ICD-10-CM | POA: Diagnosis not present

## 2023-05-26 DIAGNOSIS — I959 Hypotension, unspecified: Secondary | ICD-10-CM | POA: Diagnosis not present

## 2023-05-26 DIAGNOSIS — I9789 Other postprocedural complications and disorders of the circulatory system, not elsewhere classified: Secondary | ICD-10-CM | POA: Insufficient documentation

## 2023-05-26 DIAGNOSIS — R072 Precordial pain: Secondary | ICD-10-CM | POA: Diagnosis not present

## 2023-05-26 DIAGNOSIS — E785 Hyperlipidemia, unspecified: Secondary | ICD-10-CM | POA: Diagnosis not present

## 2023-05-26 DIAGNOSIS — I2510 Atherosclerotic heart disease of native coronary artery without angina pectoris: Secondary | ICD-10-CM | POA: Insufficient documentation

## 2023-05-26 DIAGNOSIS — I350 Nonrheumatic aortic (valve) stenosis: Secondary | ICD-10-CM | POA: Diagnosis not present

## 2023-05-26 DIAGNOSIS — Z794 Long term (current) use of insulin: Secondary | ICD-10-CM | POA: Diagnosis not present

## 2023-05-26 DIAGNOSIS — G4733 Obstructive sleep apnea (adult) (pediatric): Secondary | ICD-10-CM | POA: Insufficient documentation

## 2023-05-26 DIAGNOSIS — Z951 Presence of aortocoronary bypass graft: Secondary | ICD-10-CM | POA: Insufficient documentation

## 2023-05-26 DIAGNOSIS — E119 Type 2 diabetes mellitus without complications: Secondary | ICD-10-CM | POA: Insufficient documentation

## 2023-05-26 MED ORDER — NITROGLYCERIN 0.4 MG SL SUBL: 0.4000 mg | SUBLINGUAL_TABLET | SUBLINGUAL | 3 refills | Status: AC | PRN

## 2023-05-26 NOTE — Patient Instructions (Signed)
 Medication Instructions:  Your physician recommends that you continue on your current medications as directed. Please refer to the Current Medication list given to you today.  *If you need a refill on your cardiac medications before your next appointment, please call your pharmacy*   Lab Work: NONE ordered at this time of appointment    Testing/Procedures: NONE ordered at this time of appointment     Follow-Up: At [facility], you and your health needs are our priority.  As part of our continuing mission to provide you with exceptional heart care, we have created designated Provider Care Teams.  These Care Teams include your primary Cardiologist (physician) and Advanced Practice Providers (APPs -  Physician Assistants and Nurse Practitioners) who all work together to provide you with the care you need, when you need it.  We recommend signing up for the patient portal called "MyChart".  Sign up information is provided on this After Visit Summary.  MyChart is used to connect with patients for Virtual Visits (Telemedicine).  Patients are able to view lab/test results, encounter notes, upcoming appointments, etc.  Non-urgent messages can be sent to your provider as well.   To learn more about what you can do with MyChart, go to [email or web address].    Your next appointment:   6 month(s)  Provider:   Olga Millers, MD

## 2023-05-26 NOTE — Progress Notes (Addendum)
 Office Visit    Patient Name: Steven Lawrence Date of Encounter: 05/26/2023  Primary Care Provider:  Milus Height, PA Primary Cardiologist:  Olga Millers, MD  Chief Complaint    73 year old male with a history of CAD s/p CABG x5 (LIMA-LAD, SVG-D1, sequential SVG-OM 2/OM 3 and SVG-PDA) with subsequent PCI/DES-SVG-PDA, SVG-Diagonal, s/p PCI/DES CTO-RCA in 01/2023, aortic stenosis s/p AVR in 2021, hyperlipidemia, OSA, type 2 diabetes, and former tobacco use who presents for follow-up related to CAD.   Past Medical History    Past Medical History:  Diagnosis Date   Acid reflux    Arthritis    BPH (benign prostatic hyperplasia)    mild   CAD in native artery    a. NSTEMI 12/2019 s/p CABGx5 with bovine AVR.   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, RDI 10.2/hr ([name])   Urinary frequency    Wears glasses    Wears hearing aid    both ears   Past Surgical History:  Procedure Laterality Date   ANTERIOR LAT LUMBAR FUSION Left 01/04/2015   Procedure: ANTERIOR LATERAL LUMBAR FUSION 1 LEVEL;  Surgeon: Estill Bamberg, MD;  Location: [facility] OR;  Service: Orthopedics;  Laterality: Left;  Left sided lateral interbody fusion lumbar 3-4 with instrumentation and allograft   AORTIC VALVE REPLACEMENT N/A 12/21/2019   Procedure: AORTIC VALVE REPLACEMENT WITH INSPIRIS RESILIA AORTIC VALVE SIZE ;  Surgeon: Loreli Slot, MD;  Location: [facility] OR;  Service: Open Heart Surgery;  Laterality: N/A;   BYPASS GRAFT ANGIOGRAPHY  12/30/2022   Procedure: BYPASS GRAFT ANGIOGRAPHY;  Surgeon: Tonny Bollman, MD;  Location: [facility] INVASIVE CV LAB;  Service:  Cardiovascular;;   COLONOSCOPY     CORONARY ANGIOGRAPHY N/A 12/30/2022   Procedure: CORONARY ANGIOGRAPHY;  Surgeon: Tonny Bollman, MD;  Location: [facility] INVASIVE CV LAB;  Service: Cardiovascular;  Laterality: N/A;   CORONARY ARTERY BYPASS GRAFT N/A 12/21/2019   Procedure: CORONARY ARTERY BYPASS GRAFTING TIMES FIVE USING LEFT INTERNAL [employer] ARTERY AND ENDOSCOPICALLY HARVESTED RIGHT GREATER SAPHENOUS VEIN.;  Surgeon: Loreli Slot, MD;  Location: [facility] OR;  Service: Open Heart Surgery;  Laterality: N/A;   CORONARY ATHERECTOMY N/A 01/29/2023   Procedure: CORONARY ATHERECTOMY;  Surgeon: Swaziland, Peter M, MD;  Location: [facility] INVASIVE CV LAB;  Service: Cardiovascular;  Laterality: N/A;   CORONARY CTO INTERVENTION N/A 01/29/2023   Procedure: CORONARY CTO INTERVENTION;  Surgeon: Swaziland, Peter M, MD;  Location: [facility] INVASIVE CV LAB;  Service: Cardiovascular;  Laterality: N/A;   CORONARY STENT INTERVENTION N/A 04/11/2022   Procedure: CORONARY STENT INTERVENTION;  Surgeon: Tonny Bollman, MD;  Location: [facility] INVASIVE CV LAB;  Service: Cardiovascular;  Laterality: N/A;   CORONARY ULTRASOUND/IVUS N/A 01/29/2023   Procedure: Coronary Ultrasound/IVUS;  Surgeon: Swaziland, Peter M, MD;  Location: [facility] INVASIVE CV LAB;  Service: Cardiovascular;  Laterality: N/A;   ENDOVEIN HARVEST OF GREATER SAPHENOUS VEIN  12/21/2019   Procedure: ENDOVEIN HARVEST OF RIGHT GREATER SAPHENOUS VEIN;  Surgeon: Loreli Slot, MD;  Location: [facility] OR;  Service: Open Heart Surgery;;   ESOPHAGOGASTRODUODENOSCOPY     INGUINAL HERNIA REPAIR Right  08/12/2012   Procedure: HERNIA REPAIR INGUINAL ADULT;  Surgeon: Wilmon Arms. Corliss Skains, MD;  Location: [facility];  Service: General;  Laterality: Right;   INSERTION OF MESH Right 08/12/2012   Procedure: INSERTION OF MESH;  Surgeon: Wilmon Arms. Corliss Skains, MD;  Location: [facility];  Service: General;  Laterality: Right;   LEFT HEART CATH AND CORONARY ANGIOGRAPHY N/A 12/19/2019    Procedure: LEFT HEART CATH AND CORONARY ANGIOGRAPHY;  Surgeon: Runell Gess, MD;  Location: [facility] INVASIVE CV LAB;  Service: Cardiovascular;  Laterality: N/A;   LEFT HEART CATH AND CORS/GRAFTS ANGIOGRAPHY N/A 04/11/2022   Procedure: LEFT HEART CATH AND CORS/GRAFTS ANGIOGRAPHY;  Surgeon: Tonny Bollman, MD;  Location: [facility] INVASIVE CV LAB;  Service: Cardiovascular;  Laterality: N/A;   TEE WITHOUT CARDIOVERSION N/A 12/21/2019   Procedure: TRANSESOPHAGEAL ECHOCARDIOGRAM (TEE);  Surgeon: Loreli Slot, MD;  Location: [facility] OR;  Service: Open Heart Surgery;  Laterality: N/A;   TONSILLECTOMY     VASECTOMY  1985    Allergies  Allergies  Allergen Reactions   Nsaids     Rash-  Other Reaction(s): hives (25 years ago) 10/2014     Labs/Other Studies Reviewed    The following studies were reviewed today:  Cardiac Studies & Procedures   ______________________________________________________________________________________________ CARDIAC CATHETERIZATION  CARDIAC CATHETERIZATION 01/29/2023  Narrative   Ost RCA to Prox RCA lesion is 95% stenosed.   Mid RCA lesion is 100% stenosed.   Post intervention, there is a 0% residual stenosis.   Post intervention, there is a 0% residual stenosis.   Recommend dual antiplatelet therapy with Aspirin 81mg  daily and Clopidogrel 75mg  daily long-term (beyond 12 months) because of multiple stents.  Successful CTO PCI of the RCA with 3 overlapping stents extending from the ostium to the distal bifurcation  Plan: observe overnight. DAPT indefinitely given multiple stents. Anticipate DC in am.  Findings Coronary Findings Diagnostic  Dominance: Right  Left Anterior Descending Mid LAD lesion is 80% stenosed.  First Diagonal Branch 1st Diag lesion is 100% stenosed.  Left Circumflex Prox Cx lesion is 100% stenosed.  Second Obtuse Marginal Branch Collaterals 2nd Mrg filled by collaterals from 1st Diag.  Third Obtuse Marginal  Branch Collaterals 3rd Mrg filled by collaterals from 1st Sept.  Right Coronary Artery The RCA is severely diseased in the proximal and mid vessel.  The proximal vessel has diffuse 95% stenosis.  Mid vessel is subtotally occluded with 100% stenosis.  There may be a small channel present.  There are multiple collateral sources supplying the PDA and PLA branches of the RCA from the left coronary artery.  There are sources from both the circumflex and the septal perforators of the LAD. Ost RCA to Prox RCA lesion is 95% stenosed. Mid RCA lesion is 100% stenosed.  Acute Marginal Branch Collaterals Acute Mrg filled by collaterals from Dist LAD.  Right Posterior Descending Artery Collaterals RPDA filled by collaterals from 1st Sept.  Right Posterior Atrioventricular Artery Collaterals RPAV filled by collaterals from Prox Cx.  First Right Posterolateral Branch Collaterals 1st RPL filled by collaterals from Dist LAD.  LIMA Graft To Dist LAD And is normal in caliber.  The graft exhibits no disease.  Graft To 1st Diag The SVG to diagonal remains widely patent.  The diagonal supplies collaterals to the obtuse marginal branches the circumflex. Non-stenotic Origin to Prox Graft lesion was previously treated. The lesion is calcified.  Graft To RPDA The SVG to PDA is now occluded.  There is trickle  flow through the proximal stent.  The distal branches of the RCA are filled from left-to-right collaterals. Origin lesion is 100% stenosed. The lesion was previously treated . Focal ostial lesion with severe pressure dampening  Sequential Jump Graft Graft To 1st Mrg, 2nd Mrg Seq SVG- OM1 and OM2.  Chronic occlusion at origin of graft Origin to Prox Graft lesion before 1st Mrg  is 100% stenosed.  Intervention  Ost RCA to Prox RCA lesion Stent CATH MACH1 47F AL1 90CM guide catheter was inserted. Lesion crossed with guidewire using a WIRE ASAHI PROWATER 180CM. Pre-stent angioplasty was performed  using a BALLN EMERGE MR 2.5X20. A drug-eluting stent was successfully placed using a SYNERGY XD 2.50X38. Stent strut is well apposed. Stent overlaps previously placed stent. Post-stent angioplasty was performed using a BALLN [place] EUPHORA RX 3.0X12. Stent A drug-eluting stent was successfully placed using a SYNERGY XD 3.0X20. Stent strut is well apposed. Stent overlaps previously placed stent. Post-stent angioplasty was performed using a BALLN [place] EUPHORA RX 4.0X12. Atherectomy GUIDEWIRE ROTADRIVE FLOPPY guidewire was used to cross lesion. Rotational atherectomy was performed using a BURR ROTAPRO CNCT ADVNCR 1.5. 8 passes taken. Post-Intervention Lesion Assessment The intervention was successful. Pre-interventional TIMI flow is 2. Post-intervention TIMI flow is 3. No complications occurred at this lesion. There is a 0% residual stenosis post intervention.  Mid RCA lesion Stent CATH MACH1 47F AL1 90CM guide catheter was inserted. Lesion crossed with guidewire using a WIRE ASAHI PROWATER 180CM. Pre-stent angioplasty was performed using a BALLN EMERGE MR 2.5X20. A drug-eluting stent was successfully placed using a SYNERGY XD 2.50X48. Stent strut is well apposed. Post-stent angioplasty was not performed. Post-Intervention Lesion Assessment The intervention was successful. Pre-interventional TIMI flow is 2. Post-intervention TIMI flow is 3. No complications occurred at this lesion. Ultrasound (IVUS) was performed on the lesion post PCI using a CATH OPTICROSS HD. Stent well apposed. There is a 0% residual stenosis post intervention.   CARDIAC CATHETERIZATION  CARDIAC CATHETERIZATION 12/30/2022  Narrative   Mid LAD lesion is 80% stenosed.   Prox Cx lesion is 100% stenosed.   Origin to Prox Graft lesion before 1st Mrg  is 100% stenosed.   1st Diag lesion is 100% stenosed.   Origin lesion is 100% stenosed.   Ost RCA to Prox RCA lesion is 95% stenosed.   Mid RCA lesion is 100% stenosed.   Non-stenotic  Origin to Prox Graft lesion was previously treated.   and is normal in caliber.   Seq SVG- OM1 and OM2.   The graft exhibits no disease.  1.  Severe native vessel coronary artery disease with severe LAD stenosis, total occlusion of the circumflex, and total occlusion of the RCA 2.  Status post aortocoronary bypass surgery with continued patency of the LIMA to LAD and saphenous vein graft to diagonal 3.  Chronic occlusion of the saphenous vein graft to OM branches 4.  Interval occlusion of the saphenous vein graft to PDA with proximal occlusion within the stented segment of the bypass graft 5.  Multiple sources of collaterals to the distal RCA branches 6.  Multiple sources of collaterals to the OM branches of the circumflex  Recommendations: Chances of long-term patency with repeat saphenous vein graft intervention are very low.  Favor CTO intervention of the RCA.  Discussed case with Dr. Swaziland.  Will refer the patient for CTO intervention with Dr. Swaziland.  Findings Coronary Findings Diagnostic  Dominance: Right  Left Anterior Descending Mid LAD lesion is 80% stenosed.  First Diagonal Branch 1st Diag lesion is 100% stenosed.  Left Circumflex Prox Cx lesion is 100% stenosed.  Second Obtuse Marginal Branch Collaterals 2nd Mrg filled by collaterals from 1st Diag.  Third Obtuse Marginal Branch Collaterals 3rd Mrg filled by collaterals from 1st Sept.  Right Coronary Artery The RCA is severely diseased in the proximal and mid vessel.  The proximal vessel has diffuse 95% stenosis.  Mid vessel is subtotally occluded with 100% stenosis.  There may be a small channel present.  There are multiple collateral sources supplying the PDA and PLA branches of the RCA from the left coronary artery.  There are sources from both the circumflex and the septal perforators of the LAD. Ost RCA to Prox RCA lesion is 95% stenosed. Mid RCA lesion is 100% stenosed.  Acute Marginal  Branch Collaterals Acute Mrg filled by collaterals from Dist LAD.  Right Posterior Descending Artery Collaterals RPDA filled by collaterals from 1st Sept.  Right Posterior Atrioventricular Artery Collaterals RPAV filled by collaterals from Prox Cx.  First Right Posterolateral Branch Collaterals 1st RPL filled by collaterals from Dist LAD.  LIMA Graft To Dist LAD And is normal in caliber.  The graft exhibits no disease.  Graft To 1st Diag The SVG to diagonal remains widely patent.  The diagonal supplies collaterals to the obtuse marginal branches the circumflex. Non-stenotic Origin to Prox Graft lesion was previously treated. The lesion is calcified.  Graft To RPDA The SVG to PDA is now occluded.  There is trickle flow through the proximal stent.  The distal branches of the RCA are filled from left-to-right collaterals. Origin lesion is 100% stenosed. The lesion was previously treated . Focal ostial lesion with severe pressure dampening  Sequential Jump Graft Graft To 1st Mrg, 2nd Mrg Seq SVG- OM1 and OM2.  Chronic occlusion at origin of graft Origin to Prox Graft lesion before 1st Mrg  is 100% stenosed.  Intervention  No interventions have been documented.   STRESS TESTS  NM PET CT CARDIAC PERFUSION MULTI W/ABSOLUTE BLOODFLOW 12/24/2022  Narrative   Severe reversible apical to basal inferior/lateral perfusion defect, consistent with large area of ischemia.  High risk findings including TID (1.27).  Myocardial blood flow reserve not reliable in setting of prior CABG.  Study is high risk and cardiac catheterization is recommended.   LV perfusion is abnormal. There is evidence of ischemia. Defect 1: There is a large defect with severe reduction in uptake present in the apical to basal inferior and lateral location(s) that is reversible. There is abnormal wall motion in the defect area. Consistent with ischemia.   Rest left ventricular function is abnormal. Rest EF: 44%. Stress  left ventricular function is abnormal. Stress EF: 47%. End diastolic cavity size is normal. End systolic cavity size is normal.   Myocardial blood flow reserve is not reported in this patient due to technical or patient-specific concerns that affect accuracy (prior CABG)   Coronary calcium was present on the attenuation correction CT images. Severe coronary calcifications were present. Coronary calcifications were present in the left anterior descending artery, left circumflex artery and right coronary artery distribution(s).   Findings are consistent with ischemia. The study is high risk.   Electronically signed by Epifanio Lesches, MD  CLINICAL DATA:  This over-read does not include interpretation of cardiac or coronary anatomy or pathology. The Cardiac PET CT interpretation by the cardiologist is attached.  COMPARISON:  04/01/2022  FINDINGS: Cardiovascular: Aortic atherosclerosis. Status post aortic valve replacement. Normal heart size.  Extensive three-vessel coronary artery calcifications status post median sternotomy and CABG. No pericardial effusion.  Limited Mediastinum/Nodes: No enlarged mediastinal, hilar, or axillary lymph nodes. Small, calcified granulomatous left hilar lymph nodes, benign, requiring no further follow-up or characterization. Trachea and esophagus demonstrate no significant findings.  Limited Lungs/Pleura: Lungs are clear. No pleural effusion or pneumothorax.  Upper Abdomen: No acute abnormality.  Musculoskeletal: No chest wall abnormality. No acute osseous findings.  IMPRESSION: 1. No acute CT findings in the included chest. 2. Coronary artery disease status post median sternotomy and CABG. 3. Status post aortic valve replacement.  Aortic Atherosclerosis (ICD10-I70.0).   Electronically Signed By: Jearld Lesch M.D. On: 12/24/2022 09:21   ECHOCARDIOGRAM  ECHOCARDIOGRAM COMPLETE 01/20/2023  Narrative ECHOCARDIOGRAM REPORT    Patient  Name:   ODAI WIMMER  Date of Exam: 01/20/2023 Medical Rec #:  [id number]     Height:       69.0 in Accession #:    [id number]    Weight:       162.0 lb Date of Birth:  [birth year]     BSA:          1.889 m Patient Age:    72 years      BP:           96/56 mmHg Patient Gender: M             HR:           65 bpm. Exam Location:  [address]  Procedure: 3D Echo, 2D Echo, Cardiac Doppler, Color Doppler and Strain Analysis  Indications:    Z95.2 Status Post AVR  History:        Patient has prior history of Echocardiogram examinations, most recent 02/08/2020. CAD and Previous Myocardial Infarction, Prior CABG and Prior Cardiac Surgery, Aortic Valve Disease, Signs/Symptoms:Chest Pain; Risk Factors:Sleep Apnea, Former Smoker, Dyslipidemia, Diabetes and Family History of Coronary Artery Disease. Aortic Stenosis status post Aortic Valve Replacement with CABG (12/21/19, 23mm Inspiris Resilia Aortic Valve).  Sonographer:    Farrel Conners RDCS Referring Phys: Jonita Albee  IMPRESSIONS   1. Left ventricular ejection fraction, by estimation, is 65 to 70%. The left ventricle has normal function. The left ventricle has no regional wall motion abnormalities. Left ventricular diastolic parameters were normal. 2. Right ventricular systolic function is normal. The right ventricular size is normal. 3. Mild mitral valve regurgitation. 4. S/p AVR (23 mm Inspriis Reslia prostesis Oct 2021) Peak and mean gradients through the valve are 32 and 18 mm Hg Normal prosthetic function. . The aortic valve has been repaired/replaced. Aortic valve regurgitation is not visualized. 5. The inferior vena cava is normal in size with greater than 50% respiratory variability, suggesting right atrial pressure of 3 mmHg.  FINDINGS Left Ventricle: Left ventricular ejection fraction, by estimation, is 65 to 70%. The left ventricle has normal function. The left ventricle has no regional wall motion abnormalities. The left  ventricular internal cavity size was normal in size. There is no left ventricular hypertrophy. Left ventricular diastolic parameters were normal.  Right Ventricle: The right ventricular size is normal. Right vetricular wall thickness was not assessed. Right ventricular systolic function is normal.  Left Atrium: Left atrial size was normal in size.  Right Atrium: Right atrial size was normal in size.  Pericardium: There is no evidence of pericardial effusion.  Mitral Valve: There is mild thickening of the mitral valve leaflet(s). Mild to moderate mitral annular calcification. Mild mitral valve regurgitation.  Tricuspid Valve:  The tricuspid valve is normal in structure. Tricuspid valve regurgitation is mild.  Aortic Valve: S/p AVR (23 mm Inspriis Reslia prostesis Oct 2021) Peak and mean gradients through the valve are 32 and 18 mm Hg Normal prosthetic function. The aortic valve has been repaired/replaced. Aortic valve regurgitation is not visualized. Aortic valve mean gradient measures 16.2 mmHg. Aortic valve peak gradient measures 28.8 mmHg. Aortic valve area, by VTI measures 0.95 cm.  Pulmonic Valve: The pulmonic valve was grossly normal. Pulmonic valve regurgitation is not visualized.  Aorta: The aortic root and ascending aorta are structurally normal, with no evidence of dilitation.  Venous: The inferior vena cava is normal in size with greater than 50% respiratory variability, suggesting right atrial pressure of 3 mmHg.  IAS/Shunts: No atrial level shunt detected by color flow Doppler.   LEFT VENTRICLE PLAX 2D LVIDd:         4.80 cm   Diastology LVIDs:         2.90 cm   LV e' medial:    8.32 cm/s LV PW:         0.70 cm   LV E/e' medial:  13.3 LV IVS:        0.60 cm   LV e' lateral:   13.00 cm/s LVOT diam:     2.00 cm   LV E/e' lateral: 8.5 LV SV:         55 LV SV Index:   29        2D Longitudinal Strain LVOT Area:     3.14 cm  2D Strain GLS (A2C):   -23.0 % 2D Strain GLS  (A3C):   -21.3 % 2D Strain GLS (A4C):   -18.9 % 2D Strain GLS Avg:     -21.0 %  3D Volume EF: 3D EF:        72 % LV EDV:       166 ml LV ESV:       46 ml LV SV:        120 ml  RIGHT VENTRICLE RV Basal diam:  3.60 cm RV S prime:     14.00 cm/s TAPSE (M-mode): 1.5 cm  LEFT ATRIUM             Index        RIGHT ATRIUM           Index LA diam:        4.00 cm 2.12 cm/m   RA Pressure: 3.00 mmHg LA Vol (A2C):   54.5 ml 28.85 ml/m  RA Area:     18.20 cm LA Vol (A4C):   56.8 ml 30.07 ml/m  RA Volume:   50.70 ml  26.84 ml/m LA Biplane Vol: 57.3 ml 30.33 ml/m AORTIC VALVE AV Area (Vmax):    0.97 cm AV Area (Vmean):   0.89 cm AV Area (VTI):     0.95 cm AV Vmax:           268.25 cm/s AV Vmean:          189.000 cm/s AV VTI:            0.580 m AV Peak Grad:      28.8 mmHg AV Mean Grad:      16.2 mmHg LVOT Vmax:         82.50 cm/s LVOT Vmean:        53.450 cm/s LVOT VTI:          0.175 m LVOT/AV VTI ratio: 0.30  AORTA Ao Root diam: 2.80 cm Ao Asc diam:  3.30 cm  MITRAL VALVE                TRICUSPID VALVE MV Area (PHT): cm          Estimated RAP:  3.00 mmHg MV Decel Time: 170 msec MV E velocity: 110.50 cm/s  SHUNTS MV A velocity: 94.30 cm/s   Systemic VTI:  0.18 m MV E/A ratio:  1.17         Systemic Diam: 2.00 cm  Dietrich Pates MD Electronically signed by Dietrich Pates MD Signature Date/Time: 01/20/2023/8:58:22 AM    Final   TEE  ECHO INTRAOPERATIVE TEE 12/21/2019  Narrative *INTRAOPERATIVE TRANSESOPHAGEAL REPORT *    Patient Name:   HILLEL CARD   Date of Exam: 12/21/2019 Medical Rec #:  [id number]      Height:       69.0 in Accession #:    [id number]     Weight:       144.7 lb Date of Birth:  10-12-1950      BSA:          1.80 m Patient Age:    69 years       BP:           125/75 mmHg Patient Gender: M              HR:           58 bpm. Exam Location:  Anesthesiology  Transesophogeal exam was perform intraoperatively during surgical procedure. Patient was  closely monitored under general anesthesia during the entirety of examination.  Indications:     Aortic Stenosis; CAD Native Vessels Sonographer:     Thurman Coyer RDCS (AE) Performing Phys: [id number] Dec[address] HENDRICKSON Diagnosing Phys: Gaynelle Adu MD  Complications: No known complications during this procedure. POST-OP IMPRESSIONS - Left Ventricle: The left ventricle is unchanged from pre-bypass. - Aortic Valve: A bioprosthetic valve was placed, leaflets are freely mobile and leaflets thin. There is no regurgitation. The gradient recorded across the prosthetic valve is within the expected range. - Mitral Valve: The mitral valve appears unchanged from pre-bypass. - Tricuspid Valve: The tricuspid valve appears unchanged from pre-bypass.  PRE-OP FINDINGS Left Ventricle: The left ventricle has normal systolic function, with an ejection fraction of 60-65%. The cavity size was normal. There is mildly increased left ventricular wall thickness.  Right Ventricle: The right ventricle has normal systolic function. The cavity was mildly enlarged. There is no increase in right ventricular wall thickness.  Left Atrium: Left atrial size was not assessed. The left atrial appendage is well visualized and there is no evidence of thrombus present.  Right Atrium: Right atrial size was not assessed.  Interatrial Septum: No atrial level shunt detected by color flow Doppler.  Pericardium: There is no evidence of pericardial effusion.  Mitral Valve: The mitral valve is normal in structure. Mitral valve regurgitation is not visualized by color flow Doppler.  Tricuspid Valve: The tricuspid valve was normal in structure. Tricuspid valve regurgitation was not visualized by color flow Doppler.  Aortic Valve: The aortic valve is tricuspid There is moderate thickening of the aortic valve and There is moderate calcification of the aortic valve Aortic valve regurgitation was not visualized by color flow  Doppler. There is moderate stenosis of the aortic valve, with a calculated valve area of 1.07 cm.  Pulmonic Valve: The pulmonic valve was normal in structure. Pulmonic valve regurgitation is not visualized by  color flow Doppler.   +--------------+--------++ LEFT VENTRICLE         +--------------+--------++ PLAX 2D                +--------------+--------++ LVIDd:        4.30 cm  +--------------+--------++ LVIDs:        2.50 cm  +--------------+--------++ LVOT diam:    2.00 cm  +--------------+--------++ LV SV:        61 ml    +--------------+--------++ LV SV Index:  34.03    +--------------+--------++ LVOT Area:    3.14 cm +--------------+--------++                        +--------------+--------++  +------------------+------------++ AORTIC VALVE                   +------------------+------------++ AV Area (Vmax):   0.94 cm     +------------------+------------++ AV Area (Vmean):  0.90 cm     +------------------+------------++ AV Area (VTI):    1.07 cm     +------------------+------------++ AV Vmax:          269.00 cm/s  +------------------+------------++ AV Vmean:         168.500 cm/s +------------------+------------++ AV VTI:           0.554 m      +------------------+------------++ AV Peak Grad:     28.9 mmHg    +------------------+------------++ AV Mean Grad:     14.0 mmHg    +------------------+------------++ LVOT Vmax:        80.30 cm/s   +------------------+------------++ LVOT Vmean:       48.500 cm/s  +------------------+------------++ LVOT VTI:         0.189 m      +------------------+------------++ LVOT/AV VTI ratio:0.34         +------------------+------------++   +--------------+-------+ SHUNTS                +--------------+-------+ Systemic VTI: 0.19 m  +--------------+-------+ Systemic Diam:2.00 cm +--------------+-------+   Gaynelle Adu MD Electronically signed by Gaynelle Adu MD Signature Date/Time: 12/21/2019/3:57:24 PM    Final        ______________________________________________________________________________________________     Recent Labs: 01/30/2023: BUN 15; Creatinine, Ser 0.93; Hemoglobin 11.1; Platelets 154; Potassium 3.6; Sodium 136  Recent Lipid Panel    Component Value Date/Time   CHOL 89 01/30/2023 0538   CHOL 115 09/05/2020 0943   TRIG 54 01/30/2023 0538   HDL 37 (L) 01/30/2023 0538   HDL 55 09/05/2020 0943   CHOLHDL 2.4 01/30/2023 0538   VLDL 11 01/30/2023 0538   LDLCALC 41 01/30/2023 0538   LDLCALC 48 09/05/2020 0943    History of Present Illness    73 year old male with the above past medical history of CAD s/p CABG x5 (LIMA-LAD, SVG-D1, sequential SVG-OM 2/OM 3 and SVG-PDA) with subsequent PCI/DES-SVG-PDA, SVG-Diagonal, s/p PCI/DES CTO-RCA in 01/2023, aortic stenosis s/p AVR in 2021, hyperlipidemia, OSA, type 2 diabetes, and former tobacco use.   He was hospitalized in October 2021 in the setting of NSTEMI. Echocardiogram at the time showed normal LV function, mild LVH, moderate aortic stenosis.  Cardiac catheterization showed RCA 95%, LCx 80%, and LAD 80% stenoses. CT surgery was consulted and he underwent CABG x 5 (LIMA-LAD, SVG-D1, sequential SVG-OM 2/OM 3 and SVG-PDA), in addition to aortic valve replacement with bioprosthetic bovine pericardial valve.  Pre-CABG carotid Doppler showed 1 to 39% B ICA stenosis.  Post hospital course was complicated by atrial fibrillation, treated with  amiodarone and Eliquis.  Follow-up echocardiogram in November 2021 showed normal LV function, moderate LVH, G2 DD, previous aortic valve replacement with mean gradient 13 mmHg, no aortic insufficiency. He underwent stress testing in 02/2022 which revealed a large reversible perfusion defect in the lateral and basal to mid inferior LV segments.  He was referred for outpatient cardiac  catheterization in 04/05/2022 which showed patent LIMA-LAD but total occlusion of the sequential graft to OM1-OM2, 90% occlusion of SVG-PDA s/p DES.  Ostial SVG-diagonal showed 90% stenosis s/p DES.  He underwent cardiac PET stress test in 12/2022 which revealed severe reversible apical to basal inferior/lateral perfusion defect, consistent with large area of ischemia.  Study was considered high risk and he underwent repeat cardiac catheterization which revealed occluded grafts to the RCA and OM, patent LIMA-LAD and SVG-Diagonal.  He underwent CTO PCI/DES of the RCA in 01/2023 with excellent result.  Most recent echocardiogram in 02/04/2023 showed EF 65 to 70%, LV function, no RWMA, normal RV systolic function, mild mitral valve regurgitation, stable AVR.  He was last seen in the office on 02/05/2023 and was stable from a cardiac standpoint.  He denied symptoms concerning for angina.  He presents today for follow-up.  Since his last visit he has he has done well from a cardiac standpoint.  He has been participating in cardiac rehab and walking regularly, he denies any symptoms concerning for angina.  BP has been well-controlled.  Overall, he reports feeling well.  Home Medications    Current Outpatient Medications  Medication Sig Dispense Refill   ACCU-CHEK GUIDE test strip 1 each by Other route daily in the afternoon.     acetaminophen (TYLENOL) 650 MG CR tablet Take 1,300 mg by mouth as needed.     aspirin EC 81 MG EC tablet Take 1 tablet (81 mg total) by mouth daily. Swallow whole. 30 tablet 11   atorvastatin (LIPITOR) 80 MG tablet Take 80 mg by mouth daily.      BD PEN NEEDLE NANO 2ND GEN 32G X 4 MM MISC USE AS DIRECTED ONCE A DAY     cholestyramine light 4 g POWD Take 4 g by mouth daily. 1 Scoop     clopidogrel (PLAVIX) 75 MG tablet Take 1 tablet (75 mg total) by mouth daily. 30 tablet 9   doxazosin (CARDURA) 2 MG tablet Take 1 tablet (2 mg total) by mouth daily. 30 tablet 6   JARDIANCE 25 MG  TABS tablet Take 25 mg by mouth daily.     LANTUS SOLOSTAR 100 UNIT/ML Solostar Pen Inject 20 Units into the skin daily.     levothyroxine (SYNTHROID) 137 MCG tablet Take 137 mcg by mouth daily before breakfast.     metFORMIN (GLUCOPHAGE) 1000 MG tablet Take 1,000 mg by mouth 2 (two) times daily with a meal.     pramipexole (MIRAPEX) 0.125 MG tablet Take 0.5 mg by mouth at bedtime.     ranolazine (RANEXA) 500 MG 12 hr tablet Take 1 tablet (500 mg total) by mouth 2 (two) times daily. 180 tablet 2   nitroGLYCERIN (NITROSTAT) 0.4 MG SL tablet Place 1 tablet (0.4 mg total) under the tongue every 5 (five) minutes as needed for chest pain. 25 tablet 3   No current facility-administered medications for this visit.     Review of Systems    He denies chest pain, palpitations, dyspnea, pnd, orthopnea, n, v, dizziness, syncope, edema, weight gain, or early satiety. All other systems reviewed and are otherwise negative except  as noted above.   Physical Exam    VS:  BP 114/60   Pulse 70   Ht 5\' 9"  (1.753 m)   Wt 167 lb 6.4 oz (75.9 kg)   SpO2 97%   BMI 24.72 kg/m   GEN: Well nourished, well developed, in no acute distress. HEENT: normal. Neck: Supple, no JVD, carotid bruits, or masses. Cardiac: RRR, 2/6 murmur, no rubs, or gallops. No clubbing, cyanosis, edema.  Radials/DP/PT 2+ and equal bilaterally.  Respiratory:  Respirations regular and unlabored, clear to auscultation bilaterally. GI: Soft, nontender, nondistended, BS + x 4. MS: no deformity or atrophy. Skin: warm and dry, no rash. Neuro:  Strength and sensation are intact. Psych: Normal affect.  Accessory Clinical Findings    ECG personally reviewed by me today -    - no EKG in office today.    Lab Results  Component Value Date   WBC 5.3 01/30/2023   HGB 11.1 (L) 01/30/2023   HCT 34.3 (L) 01/30/2023   MCV 92.7 01/30/2023   PLT 154 01/30/2023   Lab Results  Component Value Date   CREATININE 0.93 01/30/2023   BUN 15  01/30/2023   NA 136 01/30/2023   K 3.6 01/30/2023   CL 108 01/30/2023   CO2 22 01/30/2023   Lab Results  Component Value Date   ALT 17 09/05/2020   AST 18 09/05/2020   ALKPHOS 66 09/05/2020   BILITOT 0.6 09/05/2020   Lab Results  Component Value Date   CHOL 89 01/30/2023   HDL 37 (L) 01/30/2023   LDLCALC 41 01/30/2023   TRIG 54 01/30/2023   CHOLHDL 2.4 01/30/2023    Lab Results  Component Value Date   HGBA1C 7.0 (H) 01/29/2023    Assessment & Plan    1. CAD/precordial pain: S/p CABG x 5 (LIMA-LAD, SVG-D1, sequential SVG-OM 2/OM 3 and SVG-PDA) in 2021.  Abnormal stress test in 02/2022.  F/u cath in 04/05/2022 which showed patent LIMA-LAD but total occlusion of the sequential graft to OM1-OM2, 90% occlusion of SVG-PDA s/p DES, and ostial SVG-diagonal with 90% stenosis s/p DES. Cardiac PET stress test in 12/2022 revealed severe reversible apical to basal inferior/lateral perfusion defect, consistent with large area of ischemia. Study was considered high risk and he underwent repeat cardiac catheterization which revealed occluded grafts to the RCA and OM, patent LIMA-LAD and SVG-Diagonal. He underwent CTO PCI/DES of the RCA in 01/2023 with excellent result. Stable with no anginal symptoms.  He is tolerating increased activity. Continue aspirin, Plavix, lisinopril, Imdur, Ranexa, Lipitor.   2. Aortic valve stenosis: S/p AVR in 2021. Most recent echocardiogram in 02/04/2023 showed EF 65 to 70%, LV function, no RWMA, normal RV systolic function, mild mitral valve regurgitation, stable AVR. Euvolemic and well compensated on exam. Continue SBE prophylaxis.    3. Hypertension/hypotension: BP well controlled. Continue current antihypertensive regimen.    4. Paroxysmal atrial fibrillation: Occurred in the postop setting, no recurrence.  Maintaining SR on exam. Not on anticoagulation.   5. Hyperlipidemia: LDL was 41 in 01/2023. Continue Lipitor.   6. OSA: Not on CPAP.  Denies any concerns.    7. Type 2 diabetes: A1c was 7.0 in 01/2023.  Monitored and managed per PCP.   8. Preoperative cardiac exam:  He notes he is pending possible root canal.  I advised him that he will have to continue DAPT without interruption for at least 6 months post PCI. He verbalized understanding. Of note, he does require SBE prophylaxis for dental  procedures as above.    9. Disposition: Follow-up in 6 months, sooner if needed.       Joylene Grapes, NP 05/26/2023, 11:02 AM

## 2023-05-27 ENCOUNTER — Encounter (HOSPITAL_COMMUNITY)
Admission: RE | Admit: 2023-05-27 | Discharge: 2023-05-27 | Disposition: A | Discharge status: Home / Self Care | Payer: Medicare Other | Source: Ambulatory Visit | Attending: Cardiology | Admitting: Cardiology

## 2023-05-27 DIAGNOSIS — Z48812 Encounter for surgical aftercare following surgery on the circulatory system: Secondary | ICD-10-CM | POA: Diagnosis not present

## 2023-05-27 DIAGNOSIS — Z955 Presence of coronary angioplasty implant and graft: Secondary | ICD-10-CM

## 2023-05-29 ENCOUNTER — Encounter (HOSPITAL_COMMUNITY)
Admission: RE | Admit: 2023-05-29 | Discharge: 2023-05-29 | Disposition: A | Discharge status: Home / Self Care | Payer: Medicare Other | Source: Ambulatory Visit | Attending: Cardiology | Admitting: Cardiology

## 2023-05-29 DIAGNOSIS — Z955 Presence of coronary angioplasty implant and graft: Secondary | ICD-10-CM | POA: Diagnosis not present

## 2023-05-29 DIAGNOSIS — Z48812 Encounter for surgical aftercare following surgery on the circulatory system: Secondary | ICD-10-CM | POA: Diagnosis not present

## 2023-06-01 ENCOUNTER — Encounter (HOSPITAL_COMMUNITY)
Admission: RE | Admit: 2023-06-01 | Discharge: 2023-06-01 | Disposition: A | Discharge status: Home / Self Care | Payer: Medicare Other | Source: Ambulatory Visit | Attending: Cardiology | Admitting: Cardiology

## 2023-06-01 DIAGNOSIS — Z955 Presence of coronary angioplasty implant and graft: Secondary | ICD-10-CM

## 2023-06-01 DIAGNOSIS — Z48812 Encounter for surgical aftercare following surgery on the circulatory system: Secondary | ICD-10-CM | POA: Diagnosis not present

## 2023-06-03 ENCOUNTER — Encounter (HOSPITAL_COMMUNITY)
Admission: RE | Admit: 2023-06-03 | Discharge: 2023-06-03 | Disposition: A | Discharge status: Home / Self Care | Payer: Medicare Other | Source: Ambulatory Visit | Attending: Cardiology | Admitting: Cardiology

## 2023-06-03 DIAGNOSIS — Z955 Presence of coronary angioplasty implant and graft: Secondary | ICD-10-CM

## 2023-06-03 DIAGNOSIS — Z48812 Encounter for surgical aftercare following surgery on the circulatory system: Secondary | ICD-10-CM | POA: Diagnosis not present

## 2023-06-05 ENCOUNTER — Encounter (HOSPITAL_COMMUNITY)
Admission: RE | Admit: 2023-06-05 | Discharge: 2023-06-05 | Disposition: A | Discharge status: Home / Self Care | Payer: Medicare Other | Source: Ambulatory Visit | Attending: Cardiology | Admitting: Cardiology

## 2023-06-05 VITALS — Ht 70.0 in | Wt 165.3 lb

## 2023-06-05 DIAGNOSIS — Z955 Presence of coronary angioplasty implant and graft: Secondary | ICD-10-CM | POA: Diagnosis not present

## 2023-06-05 DIAGNOSIS — Z48812 Encounter for surgical aftercare following surgery on the circulatory system: Secondary | ICD-10-CM | POA: Diagnosis not present

## 2023-06-08 ENCOUNTER — Encounter (HOSPITAL_COMMUNITY)
Admission: RE | Admit: 2023-06-08 | Discharge: 2023-06-08 | Disposition: A | Discharge status: Home / Self Care | Payer: Medicare Other | Source: Ambulatory Visit | Attending: Cardiology | Admitting: Cardiology

## 2023-06-08 DIAGNOSIS — Z955 Presence of coronary angioplasty implant and graft: Secondary | ICD-10-CM

## 2023-06-08 DIAGNOSIS — Z48812 Encounter for surgical aftercare following surgery on the circulatory system: Secondary | ICD-10-CM | POA: Diagnosis not present

## 2023-06-10 ENCOUNTER — Encounter (HOSPITAL_COMMUNITY)
Admission: RE | Admit: 2023-06-10 | Discharge: 2023-06-10 | Disposition: A | Discharge status: Home / Self Care | Payer: Medicare Other | Source: Ambulatory Visit | Attending: Cardiology | Admitting: Cardiology

## 2023-06-10 DIAGNOSIS — Z955 Presence of coronary angioplasty implant and graft: Secondary | ICD-10-CM | POA: Diagnosis not present

## 2023-06-10 DIAGNOSIS — Z48812 Encounter for surgical aftercare following surgery on the circulatory system: Secondary | ICD-10-CM | POA: Diagnosis not present

## 2023-06-10 NOTE — Progress Notes (Signed)
 Discharge Progress Report  Patient Details  Name: Steven Lawrence MRN: [id number] Date of Birth: 1951/01/07 Referring Provider:   Flowsheet Row INTENSIVE CARDIAC REHAB ORIENT from 03/17/2023 in [facility]  Referring Provider Olga Millers, MD        Number of Visits: 56  Reason for Discharge:  Patient reached a stable level of exercise. Patient independent in their exercise. Patient has met program and personal goals.  Smoking History:  Social History   Tobacco Use  Smoking Status Former   Current packs/day: 0.00   Average packs/day: 1 pack/day for 53.0 years (53.0 ttl pk-yrs)   Types: Cigarettes   Start date: 12/16/1966   Quit date: 12/16/2019   Years since quitting: 3.5  Smokeless Tobacco Never    Diagnosis:  01/29/23 Status post coronary artery stent placement RCA x 3  ADL [facility]:   Initial Exercise Prescription:  Initial Exercise Prescription - 03/17/23 1200       Date of Initial Exercise RX and Referring Provider   Date 03/17/23    Referring Provider Olga Millers, MD    Expected Discharge Date 06/10/23      Treadmill   MPH 3    Grade 1    Minutes 15    METs 3.71      Recumbant Elliptical   Level 2    RPM 60    Watts 50    Minutes 15    METs 4.1      Prescription Details   Frequency (times per week) 3    Duration Progress to 30 minutes of continuous aerobic without signs/symptoms of physical distress      Intensity   THRR 40-80% of Max Heartrate 59-118    Ratings of Perceived Exertion 11-13    Perceived Dyspnea 0-4      Progression   Progression Continue progressive overload as per policy without signs/symptoms or physical distress.      Resistance Training   Training Prescription Yes    Weight 4 lbs    Reps 10-15             Discharge Exercise Prescription (Final Exercise Prescription Changes):  Exercise Prescription Changes - 06/12/23 1546       Response to Exercise    Blood Pressure (Admit) 128/70    Blood Pressure (Exit) 120/64    Heart Rate (Admit) 73 bpm    Heart Rate (Exercise) 136 bpm    Heart Rate (Exit) 88 bpm    Rating of Perceived Exertion (Exercise) 11    Symptoms None    Comments Pt graduated from the CRP2 program today    Duration Continue with 30 min of aerobic exercise without signs/symptoms of physical distress.    Intensity THRR unchanged      Progression   Progression Continue to progress workloads to maintain intensity without signs/symptoms of physical distress.    Average METs 5.8      Resistance Training   Training Prescription Yes    Weight 6 lbs    Reps 10-15    Time 10 Minutes      Interval Training   Interval Training No      Recumbant Elliptical   Level 5    RPM 67    Watts 105    Minutes 15    METs 6      Rower   Level 3    Watts 44    Minutes 15    METs  5.53      Home Exercise Plan   Plans to continue exercise at Home (comment)    Frequency Add 2 additional days to program exercise sessions.    Initial Home Exercises Provided 04/24/23             Functional Capacity:  6 Minute Walk     Row Name 03/17/23 1219 06/05/23 0905       6 Minute Walk   Phase Initial Discharge    Distance 1833 feet 2225 feet    Distance % Change -- 21.39 %    Distance Feet Change -- 392 ft    Walk Time 6 minutes 6 minutes    # of Rest Breaks 0 0    MPH 3.47 4.21    METS 4.4 5.3    RPE 13 12    Perceived Dyspnea  0 0    VO2 Peak 15.3 18.4    Symptoms No No    Resting HR 69 bpm 67 bpm    Resting BP 114/60 116/60    Resting Oxygen Saturation  97 % --    Exercise Oxygen Saturation  during 6 min walk 99 % --    Max Ex. HR 119 bpm 139 bpm    Max Ex. BP 152/68 150/70    2 Minute Post BP 130/70 --             Psychological, QOL, Others - Outcomes: PHQ 2/9:    06/10/2023    9:49 AM 03/17/2023   11:17 AM 07/23/2022   10:44 AM 05/14/2022    9:41 AM 05/10/2020    1:22 PM  Depression screen PHQ 2/9   Decreased Interest 0 0 0 0 0  Down, Depressed, Hopeless 0 0 0 1 0  PHQ - 2 Score 0 0 0 1 0  Altered sleeping 0 0 0 0   Tired, decreased energy 0 0 0 1   Change in appetite 0 0 0 0   Feeling bad or failure about yourself  0 0 0 0   Trouble concentrating 0 0 0 0   Moving slowly or fidgety/restless 0 0 0 0   Suicidal thoughts 0 0 0 0   PHQ-9 Score 0 0 0 2   Difficult doing work/chores Not difficult at all  Not difficult at all Not difficult at all     Quality of Life:  Quality of Life - 06/04/23 1004       Quality of Life   Select Quality of Life      Quality of Life Scores   Health/Function Pre 27.4 %    Health/Function Post 27.73 %    Health/Function % Change 1.2 %    Socioeconomic Pre 29.29 %    Socioeconomic Post 28.44 %    Socioeconomic % Change  -2.9 %    Psych/Spiritual Pre 27.86 %    Psych/Spiritual Post 28.07 %    Psych/Spiritual % Change 0.75 %    Family Pre 28.8 %    Family Post 28.8 %    Family % Change 0 %    GLOBAL Pre 28.09 %    GLOBAL Post 28.11 %    GLOBAL % Change 0.07 %             Personal Goals: Goals established at orientation with interventions provided to work toward goal.  Personal Goals and Risk Factors at Admission - 03/17/23 1119       Core Components/Risk Factors/Patient Goals on Admission  Weight Management Weight Maintenance    Diabetes Yes    Intervention Provide education about signs/symptoms and action to take for hypo/hyperglycemia.;Provide education about proper nutrition, including hydration, and aerobic/resistive exercise prescription along with prescribed medications to achieve blood glucose in normal ranges: Fasting glucose 65-99 mg/dL    Expected Outcomes Short Term: Participant verbalizes understanding of the signs/symptoms and immediate care of hyper/hypoglycemia, proper foot care and importance of medication, aerobic/resistive exercise and nutrition plan for blood glucose control.;Long Term: Attainment of HbA1C < 7%.     Hypertension Yes    Intervention Provide education on lifestyle modifcations including regular physical activity/exercise, weight management, moderate sodium restriction and increased consumption of fresh fruit, vegetables, and low fat dairy, alcohol moderation, and smoking cessation.;Monitor prescription use compliance.    Expected Outcomes Short Term: Continued assessment and intervention until BP is < 140/51mm HG in hypertensive participants. < 130/44mm HG in hypertensive participants with diabetes, heart failure or chronic kidney disease.;Long Term: Maintenance of blood pressure at goal levels.    Lipids Yes    Intervention Provide education and support for participant on nutrition & aerobic/resistive exercise along with prescribed medications to achieve LDL 70mg , HDL >40mg .    Expected Outcomes Short Term: Participant states understanding of desired cholesterol values and is compliant with medications prescribed. Participant is following exercise prescription and nutrition guidelines.;Long Term: Cholesterol controlled with medications as prescribed, with individualized exercise RX and with personalized nutrition plan. Value goals: LDL < 70mg , HDL > 40 mg.              Personal Goals Discharge:  Goals and Risk Factor Review     Row Name 03/25/23 1414 04/16/23 1004 05/19/23 1014         Core Components/Risk Factors/Patient Goals Review   Personal Goals Review Weight Management/Obesity;Hypertension;Lipids;Diabetes Weight Management/Obesity;Hypertension;Lipids;Diabetes Weight Management/Obesity;Hypertension;Lipids;Diabetes     Review Steven Lawrence started cardiac rehab on 03/25/23. Steven Lawrence did well with exercise. Vital signs and CBG's were stable. Steven Lawrence is doing well with exercise at cardiac rehab. Vital signs and CBG's have been stable. Steven Lawrence  continues to do well with exercise at cardiac rehab. Vital signs and CBG's  remain stable.     Expected Outcomes Steven Lawrence will continue to particpate in cardiac  rehab for exercise, nutrtion and lifestyle modifications Steven Lawrence will continue to particpate in cardiac rehab for exercise, nutrtion and lifestyle modifications Steven Lawrence will continue to particpate in cardiac rehab for exercise, nutrtion and lifestyle modifications              Exercise Goals and Review:  Exercise Goals     Row Name 03/17/23 1115             Exercise Goals   Increase Physical Activity Yes       Intervention Provide advice, education, support and counseling about physical activity/exercise needs.;Develop an individualized exercise prescription for aerobic and resistive training based on initial evaluation findings, risk stratification, comorbidities and participant's personal goals.       Expected Outcomes Short Term: Attend rehab on a regular basis to increase amount of physical activity.;Long Term: Exercising regularly at least 3-5 days a week.;Long Term: Add in home exercise to make exercise part of routine and to increase amount of physical activity.       Increase Strength and Stamina Yes       Intervention Provide advice, education, support and counseling about physical activity/exercise needs.;Develop an individualized exercise prescription for aerobic and resistive training based on initial evaluation findings, risk stratification, comorbidities and  participant's personal goals.       Expected Outcomes Short Term: Increase workloads from initial exercise prescription for resistance, speed, and METs.;Short Term: Perform resistance training exercises routinely during rehab and add in resistance training at home;Long Term: Improve cardiorespiratory fitness, muscular endurance and strength as measured by increased METs and functional capacity ( )       Able to understand and use rate of perceived exertion (RPE) scale Yes       Intervention Provide education and explanation on how to use RPE scale       Expected Outcomes Short Term: Able to use RPE daily in rehab to express  subjective intensity level;Long Term:  Able to use RPE to guide intensity level when exercising independently       Knowledge and understanding of Target Heart Rate Range (THRR) Yes       Intervention Provide education and explanation of THRR including how the numbers were predicted and where they are located for reference       Expected Outcomes Short Term: Able to state/look up THRR;Long Term: Able to use THRR to govern intensity when exercising independently;Short Term: Able to use daily as guideline for intensity in rehab       Understanding of Exercise Prescription Yes       Intervention Provide education, explanation, and written materials on patient's individual exercise prescription       Expected Outcomes Short Term: Able to explain program exercise prescription;Long Term: Able to explain home exercise prescription to exercise independently                Exercise Goals Re-Evaluation:  Exercise Goals Re-Evaluation     Row Name 03/25/23 1408 04/22/23 1624 05/15/23 1549 05/18/23 1654 06/12/23 1554     Exercise Goal Re-Evaluation   Exercise Goals Review Increase Physical Activity;Understanding of Exercise Prescription;Increase Strength and Stamina;Knowledge and understanding of Target Heart Rate Range (THRR);Able to understand and use rate of perceived exertion (RPE) scale Increase Physical Activity;Understanding of Exercise Prescription;Increase Strength and Stamina;Knowledge and understanding of Target Heart Rate Range (THRR);Able to understand and use rate of perceived exertion (RPE) scale -- Increase Physical Activity;Understanding of Exercise Prescription;Increase Strength and Stamina;Knowledge and understanding of Target Heart Rate Range (THRR);Able to understand and use rate of perceived exertion (RPE) scale Increase Physical Activity;Understanding of Exercise Prescription;Increase Strength and Stamina;Knowledge and understanding of Target Heart Rate Range (THRR);Able to understand  and use rate of perceived exertion (RPE) scale   Comments Pt's first day in the CRP2 program. Pt understands the exercise Rx, RPE scale and THRR. Reviewed METs and goals. Pt is making progress on his goal of getting back into a regualr exercise program. In addtion to the CRP2 program, pt voices walking at home or riding his stationary bike 2-3x/week. Pt also reports progress on his goal of increased strength and stamina, -- Reviewed METs and goals. Pt has rreached his goal of getting back into a regualr exercise program. In addtion to the CRP2 program, pt voices walking at home or riding his stationary bike 2-3x/week. Pt continues reports progress on his goal of increased strength and stamina. Pt has not returned to hiking or fishing yet due to the weather, but feels that he could. Pt graduated from the CRP2 program today. Pt made good progress in the program. Strength and stamina have improved and patient is back into a regualr exercise program. Pt had peak METs of 6.0. Pt will continue riding his bike and walking at home.   Expected  Outcomes Will continue to montior patient and progress exercise workloads as tolerated. Will continue to montior patient and progress exercise workloads as tolerated. -- Will continue to montior patient and progress exercise workloads as tolerated. Pt will continue to exercise on his own at home.            Nutrition & Weight - Outcomes:  Pre Biometrics - 03/17/23 1052       Pre Biometrics   Waist Circumference 38 inches    Hip Circumference 38.5 inches    Waist to Hip Ratio 0.99 %    Triceps Skinfold 8 mm    % Body Fat 22.7 %    Grip Strength 32 kg    Flexibility 15.25 in    Single Leg Stand 6.62 seconds             Post Biometrics - 06/05/23 0915        Post  Biometrics   Height 5\' 10"  (1.778 m)    Weight 75 kg    Waist Circumference 37 inches    Hip Circumference 38 inches    Waist to Hip Ratio 0.97 %    BMI (Calculated) 23.72    Triceps  Skinfold 10 mm    % Body Fat 23 %    Grip Strength 35 kg    Flexibility 17.5 in    Single Leg Stand 5 seconds             Nutrition:  Nutrition Therapy & Goals - 06/08/23 1030       Nutrition Therapy   Diet Heart Healthy/Carbohydrate Consistent Diet    Drug/Food Interactions Statins/Certain Fruits      Personal Nutrition Goals   Nutrition Goal Patient to identify strategies for reducing cardiovascular risk by attending the weekly [employer] education and nutrition series   goal in action.   Personal Goal #2 Patient to improve diet quality by using the plate method as a daily guide for meal planning to include lean protein/plant protein, fruits, vegetables, whole grains, nonfat dairy as part of well balanced diet   goal in action.   Personal Goal #3 Patient to reduce sodium to 1500mg  per day   goal in action.   Personal Goal #4 Patient to identify strategies for blood sugar control with goal A1c <7%   goal in action.   Comments Goals in action. Braxon has previously completed Intensive Cardiac Rehab in May 2024. At that time he also completed one on one nutrition counseling and consistently attended the [employer] education series. Adden continues to attend the [employer] and nutrition series regularly. He has good nutrition knowledge of heart healthy diet including increased fiber, decreased saturated fat, and reduced sodium intake. He continues to work on blood sugar control and reports improved am/fasting blood sugars; most recent A1c was 7.0.. He has adopted more plant based eating and increased dietary fiber. LDL remains well controlled.   He has maintained his weight since starting with our program. Henley will continue to benefit from participation in intensive cardiac rehab and adherance to nutrition, exercise, and lifestyle modificaton recommendations.      Intervention Plan   Intervention Prescribe, educate and counsel regarding individualized specific dietary modifications  aiming towards targeted core components such as weight, hypertension, lipid management, diabetes, heart failure and other comorbidities.;Nutrition handout(s) given to patient.    Expected Outcomes Short Term Goal: Understand basic principles of dietary content, such as calories, fat, sodium, cholesterol and nutrients.;Long Term Goal: Adherence to prescribed nutrition plan.;Short  Term Goal: A plan has been developed with personal nutrition goals set during dietitian appointment.             Nutrition Discharge:  Nutrition Assessments - 03/25/23 1402       Rate Your Plate Scores   Pre Score 78             Education Questionnaire Score:  Knowledge Questionnaire Score - 06/04/23 0925       Knowledge Questionnaire Score   Post Score 24/24             Goals reviewed with patient; copy given to patient.Pt graduates from  Intensive/Traditional cardiac rehab program on 06/12/23 with completion of  35 exercise and  35 education sessions. Pt maintained good attendance and progressed nicely during their participation in rehab as evidenced by increased MET level.Noam increased his distance on his post exercise walk test by 392  Medication list reconciled. Repeat  PHQ score- 0 .  Pt has made significant lifestyle changes and should be commended for his success. Kinnick  achieved their goals during cardiac rehab.   Pt plans to continue exercise at home walking 5 days a week and going to the [facility] 2 days a week. We are proud of Kenry's progress. Thayer Headings RN BSN

## 2023-06-12 ENCOUNTER — Encounter (HOSPITAL_COMMUNITY)
Admission: RE | Admit: 2023-06-12 | Discharge: 2023-06-12 | Disposition: A | Discharge status: Home / Self Care | Payer: Medicare Other | Source: Ambulatory Visit | Attending: Cardiology | Admitting: Cardiology

## 2023-06-12 DIAGNOSIS — Z955 Presence of coronary angioplasty implant and graft: Secondary | ICD-10-CM

## 2023-06-12 DIAGNOSIS — Z48812 Encounter for surgical aftercare following surgery on the circulatory system: Secondary | ICD-10-CM | POA: Diagnosis not present

## 2023-06-15 DIAGNOSIS — E039 Hypothyroidism, unspecified: Secondary | ICD-10-CM | POA: Diagnosis not present

## 2023-06-15 DIAGNOSIS — E113293 Type 2 diabetes mellitus with mild nonproliferative diabetic retinopathy without macular edema, bilateral: Secondary | ICD-10-CM | POA: Diagnosis not present

## 2023-06-15 DIAGNOSIS — I2510 Atherosclerotic heart disease of native coronary artery without angina pectoris: Secondary | ICD-10-CM | POA: Diagnosis not present

## 2023-06-15 DIAGNOSIS — E1169 Type 2 diabetes mellitus with other specified complication: Secondary | ICD-10-CM | POA: Diagnosis not present

## 2023-07-15 DIAGNOSIS — E113293 Type 2 diabetes mellitus with mild nonproliferative diabetic retinopathy without macular edema, bilateral: Secondary | ICD-10-CM | POA: Diagnosis not present

## 2023-07-15 DIAGNOSIS — I2510 Atherosclerotic heart disease of native coronary artery without angina pectoris: Secondary | ICD-10-CM | POA: Diagnosis not present

## 2023-07-15 DIAGNOSIS — E1169 Type 2 diabetes mellitus with other specified complication: Secondary | ICD-10-CM | POA: Diagnosis not present

## 2023-07-15 DIAGNOSIS — E039 Hypothyroidism, unspecified: Secondary | ICD-10-CM | POA: Diagnosis not present

## 2023-07-15 DIAGNOSIS — I700 Atherosclerosis of aorta: Secondary | ICD-10-CM | POA: Diagnosis not present

## 2023-07-23 DIAGNOSIS — E113293 Type 2 diabetes mellitus with mild nonproliferative diabetic retinopathy without macular edema, bilateral: Secondary | ICD-10-CM | POA: Diagnosis not present

## 2023-07-23 DIAGNOSIS — E1169 Type 2 diabetes mellitus with other specified complication: Secondary | ICD-10-CM | POA: Diagnosis not present

## 2023-07-23 DIAGNOSIS — I2510 Atherosclerotic heart disease of native coronary artery without angina pectoris: Secondary | ICD-10-CM | POA: Diagnosis not present

## 2023-07-23 DIAGNOSIS — I700 Atherosclerosis of aorta: Secondary | ICD-10-CM | POA: Diagnosis not present

## 2023-07-31 DIAGNOSIS — Z86018 Personal history of other benign neoplasm: Secondary | ICD-10-CM | POA: Diagnosis not present

## 2023-07-31 DIAGNOSIS — D225 Melanocytic nevi of trunk: Secondary | ICD-10-CM | POA: Diagnosis not present

## 2023-07-31 DIAGNOSIS — L219 Seborrheic dermatitis, unspecified: Secondary | ICD-10-CM | POA: Diagnosis not present

## 2023-07-31 DIAGNOSIS — L821 Other seborrheic keratosis: Secondary | ICD-10-CM | POA: Diagnosis not present

## 2023-07-31 DIAGNOSIS — L570 Actinic keratosis: Secondary | ICD-10-CM | POA: Diagnosis not present

## 2023-07-31 DIAGNOSIS — L814 Other melanin hyperpigmentation: Secondary | ICD-10-CM | POA: Diagnosis not present

## 2023-07-31 DIAGNOSIS — L578 Other skin changes due to chronic exposure to nonionizing radiation: Secondary | ICD-10-CM | POA: Diagnosis not present

## 2023-08-15 DIAGNOSIS — E039 Hypothyroidism, unspecified: Secondary | ICD-10-CM | POA: Diagnosis not present

## 2023-08-15 DIAGNOSIS — E1169 Type 2 diabetes mellitus with other specified complication: Secondary | ICD-10-CM | POA: Diagnosis not present

## 2023-08-15 DIAGNOSIS — E113293 Type 2 diabetes mellitus with mild nonproliferative diabetic retinopathy without macular edema, bilateral: Secondary | ICD-10-CM | POA: Diagnosis not present

## 2023-08-15 DIAGNOSIS — I700 Atherosclerosis of aorta: Secondary | ICD-10-CM | POA: Diagnosis not present

## 2023-08-15 DIAGNOSIS — I2510 Atherosclerotic heart disease of native coronary artery without angina pectoris: Secondary | ICD-10-CM | POA: Diagnosis not present

## 2023-08-22 DIAGNOSIS — I700 Atherosclerosis of aorta: Secondary | ICD-10-CM | POA: Diagnosis not present

## 2023-08-22 DIAGNOSIS — I2510 Atherosclerotic heart disease of native coronary artery without angina pectoris: Secondary | ICD-10-CM | POA: Diagnosis not present

## 2023-08-22 DIAGNOSIS — E1169 Type 2 diabetes mellitus with other specified complication: Secondary | ICD-10-CM | POA: Diagnosis not present

## 2023-08-22 DIAGNOSIS — E113293 Type 2 diabetes mellitus with mild nonproliferative diabetic retinopathy without macular edema, bilateral: Secondary | ICD-10-CM | POA: Diagnosis not present

## 2023-09-09 DIAGNOSIS — H26493 Other secondary cataract, bilateral: Secondary | ICD-10-CM | POA: Diagnosis not present

## 2023-09-14 DIAGNOSIS — G2581 Restless legs syndrome: Secondary | ICD-10-CM | POA: Diagnosis not present

## 2023-09-14 DIAGNOSIS — Z122 Encounter for screening for malignant neoplasm of respiratory organs: Secondary | ICD-10-CM | POA: Diagnosis not present

## 2023-09-14 DIAGNOSIS — G4730 Sleep apnea, unspecified: Secondary | ICD-10-CM | POA: Diagnosis not present

## 2023-09-14 DIAGNOSIS — I2510 Atherosclerotic heart disease of native coronary artery without angina pectoris: Secondary | ICD-10-CM | POA: Diagnosis not present

## 2023-09-14 DIAGNOSIS — E7800 Pure hypercholesterolemia, unspecified: Secondary | ICD-10-CM | POA: Diagnosis not present

## 2023-09-14 DIAGNOSIS — E11319 Type 2 diabetes mellitus with unspecified diabetic retinopathy without macular edema: Secondary | ICD-10-CM | POA: Diagnosis not present

## 2023-09-14 DIAGNOSIS — D6869 Other thrombophilia: Secondary | ICD-10-CM | POA: Diagnosis not present

## 2023-09-14 DIAGNOSIS — E113293 Type 2 diabetes mellitus with mild nonproliferative diabetic retinopathy without macular edema, bilateral: Secondary | ICD-10-CM | POA: Diagnosis not present

## 2023-09-14 DIAGNOSIS — N400 Enlarged prostate without lower urinary tract symptoms: Secondary | ICD-10-CM | POA: Diagnosis not present

## 2023-09-14 DIAGNOSIS — E1169 Type 2 diabetes mellitus with other specified complication: Secondary | ICD-10-CM | POA: Diagnosis not present

## 2023-09-14 DIAGNOSIS — E039 Hypothyroidism, unspecified: Secondary | ICD-10-CM | POA: Diagnosis not present

## 2023-09-14 DIAGNOSIS — I700 Atherosclerosis of aorta: Secondary | ICD-10-CM | POA: Diagnosis not present

## 2023-09-16 ENCOUNTER — Other Ambulatory Visit: Payer: Self-pay

## 2023-09-16 DIAGNOSIS — Z87891 Personal history of nicotine dependence: Secondary | ICD-10-CM

## 2023-09-16 DIAGNOSIS — Z122 Encounter for screening for malignant neoplasm of respiratory organs: Secondary | ICD-10-CM

## 2023-09-21 DIAGNOSIS — I700 Atherosclerosis of aorta: Secondary | ICD-10-CM | POA: Diagnosis not present

## 2023-09-21 DIAGNOSIS — I2510 Atherosclerotic heart disease of native coronary artery without angina pectoris: Secondary | ICD-10-CM | POA: Diagnosis not present

## 2023-09-21 DIAGNOSIS — E113293 Type 2 diabetes mellitus with mild nonproliferative diabetic retinopathy without macular edema, bilateral: Secondary | ICD-10-CM | POA: Diagnosis not present

## 2023-09-21 DIAGNOSIS — E1169 Type 2 diabetes mellitus with other specified complication: Secondary | ICD-10-CM | POA: Diagnosis not present

## 2023-10-15 DIAGNOSIS — E113293 Type 2 diabetes mellitus with mild nonproliferative diabetic retinopathy without macular edema, bilateral: Secondary | ICD-10-CM | POA: Diagnosis not present

## 2023-10-15 DIAGNOSIS — I2510 Atherosclerotic heart disease of native coronary artery without angina pectoris: Secondary | ICD-10-CM | POA: Diagnosis not present

## 2023-10-15 DIAGNOSIS — E039 Hypothyroidism, unspecified: Secondary | ICD-10-CM | POA: Diagnosis not present

## 2023-10-15 DIAGNOSIS — I700 Atherosclerosis of aorta: Secondary | ICD-10-CM | POA: Diagnosis not present

## 2023-10-15 DIAGNOSIS — E1169 Type 2 diabetes mellitus with other specified complication: Secondary | ICD-10-CM | POA: Diagnosis not present

## 2023-10-21 DIAGNOSIS — I2510 Atherosclerotic heart disease of native coronary artery without angina pectoris: Secondary | ICD-10-CM | POA: Diagnosis not present

## 2023-10-21 DIAGNOSIS — I700 Atherosclerosis of aorta: Secondary | ICD-10-CM | POA: Diagnosis not present

## 2023-10-21 DIAGNOSIS — E1169 Type 2 diabetes mellitus with other specified complication: Secondary | ICD-10-CM | POA: Diagnosis not present

## 2023-10-21 DIAGNOSIS — E113293 Type 2 diabetes mellitus with mild nonproliferative diabetic retinopathy without macular edema, bilateral: Secondary | ICD-10-CM | POA: Diagnosis not present

## 2023-11-10 ENCOUNTER — Inpatient Hospital Stay
Admission: RE | Admit: 2023-11-10 | Discharge: 2023-11-10 | Disposition: A | Discharge status: Home / Self Care | Source: Ambulatory Visit | Attending: Acute Care | Admitting: Acute Care

## 2023-11-10 DIAGNOSIS — Z87891 Personal history of nicotine dependence: Secondary | ICD-10-CM | POA: Diagnosis not present

## 2023-11-10 DIAGNOSIS — Z122 Encounter for screening for malignant neoplasm of respiratory organs: Secondary | ICD-10-CM

## 2023-11-15 DIAGNOSIS — I2510 Atherosclerotic heart disease of native coronary artery without angina pectoris: Secondary | ICD-10-CM | POA: Diagnosis not present

## 2023-11-15 DIAGNOSIS — E1169 Type 2 diabetes mellitus with other specified complication: Secondary | ICD-10-CM | POA: Diagnosis not present

## 2023-11-15 DIAGNOSIS — E113293 Type 2 diabetes mellitus with mild nonproliferative diabetic retinopathy without macular edema, bilateral: Secondary | ICD-10-CM | POA: Diagnosis not present

## 2023-11-15 DIAGNOSIS — E039 Hypothyroidism, unspecified: Secondary | ICD-10-CM | POA: Diagnosis not present

## 2023-11-15 DIAGNOSIS — I700 Atherosclerosis of aorta: Secondary | ICD-10-CM | POA: Diagnosis not present

## 2023-11-15 NOTE — Progress Notes (Signed)
 HPI: Follow-up coronary artery disease. Carotid Dopplers 10/21 showed 1 to 39% bilateral stenosis.  Patient had coronary artery bypass and graft with LIMA to the LAD, saphenous vein graft to the first diagonal, sequential saphenous vein graft to OM 2/OM 3 and saphenous vein graft to the PDA; aortic valve replacement with bovine pericardial valve 10/21. Course complicated by atrial fibrillation treated with amiodarone  and apixaban . Abdominal ultrasound December 2021 showed no aneurysm.  Cardiac catheterization January 2024 showed severe three-vessel coronary artery disease with patent LIMA to the LAD, total occlusion of sequential saphenous vein graft to the OM1 and OM 2 and severe stenosis of the saphenous vein graft to the diagonal (status post PCI) and severe stenosis in the saphenous vein graft to the PDA (status post PCI).  Repeat catheterization October 2024 showed interval occlusion of the saphenous vein graft to the PDA and multiple sources of collaterals to the distal RCA and OM.  Patient subsequently underwent successful PCI of the right coronary artery (CTO) with 3 overlapping stents by Dr. Swaziland.  Echocardiogram November 2024 showed normal LV function, mild mitral regurgitation, status post aortic valve replacement with mean gradient 18 mmHg and no aortic insufficiency.  Since last seen patient describes chest tightness with vigorous activities such as long walks at a brisk pace relieved with rest.  He does not have this with routine activities and denies any rest symptoms.  He denies dyspnea or syncope.  Current Outpatient Medications  Medication Sig Dispense Refill   ACCU-CHEK GUIDE test strip 1 each by Other route daily in the afternoon.     acetaminophen  (TYLENOL ) 650 MG CR tablet Take 1,300 mg by mouth as needed.     aspirin  EC 81 MG EC tablet Take 1 tablet (81 mg total) by mouth daily. Swallow whole. 30 tablet 11   atorvastatin  (LIPITOR ) 80 MG tablet Take 80 mg by mouth daily.       BD PEN NEEDLE NANO 2ND GEN 32G X 4 MM MISC USE AS DIRECTED ONCE A DAY     cholestyramine  light 4 g POWD Take 4 g by mouth daily. 1 Scoop     clopidogrel  (PLAVIX ) 75 MG tablet Take 1 tablet (75 mg total) by mouth daily. 30 tablet 9   doxazosin  (CARDURA ) 2 MG tablet TAKE 1 TABLET BY MOUTH EVERY DAY 90 tablet 2   JARDIANCE 25 MG TABS tablet Take 25 mg by mouth daily.     LANTUS  SOLOSTAR 100 UNIT/ML Solostar Pen Inject 20 Units into the skin daily.     levothyroxine  (SYNTHROID ) 137 MCG tablet Take 137 mcg by mouth daily before breakfast.     metFORMIN  (GLUCOPHAGE ) 1000 MG tablet Take 1,000 mg by mouth 2 (two) times daily with a meal.     nitroGLYCERIN  (NITROSTAT ) 0.4 MG SL tablet Place 1 tablet (0.4 mg total) under the tongue every 5 (five) minutes as needed for chest pain. 25 tablet 3   pramipexole  (MIRAPEX ) 0.125 MG tablet Take 0.5 mg by mouth at bedtime.     ranolazine  (RANEXA ) 500 MG 12 hr tablet Take 1 tablet (500 mg total) by mouth 2 (two) times daily. 180 tablet 2   No current facility-administered medications for this visit.     Past Medical History:  Diagnosis Date   Acid reflux    Arthritis    BPH (benign prostatic hyperplasia)    mild   CAD in native artery    a. NSTEMI 12/2019 s/p CABGx5 with bovine AVR.  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, RDI 10.2/hr ([name])   Urinary frequency    Wears glasses    Wears hearing aid    both ears    Past Surgical History:  Procedure Laterality Date   ANTERIOR LAT LUMBAR FUSION Left 01/04/2015   Procedure: ANTERIOR LATERAL LUMBAR FUSION 1 LEVEL;  Surgeon: Oneil Priestly, MD;  Location: [facility]  OR;  Service: Orthopedics;  Laterality: Left;  Left sided lateral interbody fusion lumbar 3-4 with instrumentation and allograft   AORTIC VALVE REPLACEMENT N/A 12/21/2019   Procedure: AORTIC VALVE REPLACEMENT WITH INSPIRIS RESILIA AORTIC VALVE SIZE ;  Surgeon: Kerrin Elspeth BROCKS, MD;  Location: [facility] OR;  Service: Open Heart Surgery;  Laterality: N/A;   BYPASS GRAFT ANGIOGRAPHY  12/30/2022   Procedure: BYPASS GRAFT ANGIOGRAPHY;  Surgeon: Wonda Sharper, MD;  Location: [facility] INVASIVE CV LAB;  Service: Cardiovascular;;   COLONOSCOPY     CORONARY ANGIOGRAPHY N/A 12/30/2022   Procedure: CORONARY ANGIOGRAPHY;  Surgeon: Wonda Sharper, MD;  Location: [facility] INVASIVE CV LAB;  Service: Cardiovascular;  Laterality: N/A;   CORONARY ARTERY BYPASS GRAFT N/A 12/21/2019   Procedure: CORONARY ARTERY BYPASS GRAFTING TIMES FIVE USING LEFT INTERNAL [employer]AMMART ARTERY AND ENDOSCOPICALLY HARVESTED RIGHT GREATER SAPHENOUS VEIN.;  Surgeon: Kerrin Elspeth BROCKS, MD;  Location: [facility] OR;  Service: Open Heart Surgery;  Laterality: N/A;   CORONARY ATHERECTOMY N/A 01/29/2023   Procedure: CORONARY ATHERECTOMY;  Surgeon: Swaziland, Peter M, MD;  Location: [facility] INVASIVE CV LAB;  Service: Cardiovascular;  Laterality: N/A;   CORONARY CTO INTERVENTION N/A 01/29/2023   Procedure: CORONARY CTO INTERVENTION;  Surgeon: Swaziland, Peter M, MD;  Location: [facility] INVASIVE CV LAB;  Service: Cardiovascular;  Laterality: N/A;   CORONARY STENT INTERVENTION N/A 04/11/2022   Procedure: CORONARY STENT INTERVENTION;  Surgeon: Wonda Sharper, MD;  Location: [facility] INVASIVE CV LAB;  Service: Cardiovascular;  Laterality: N/A;   CORONARY ULTRASOUND/IVUS N/A 01/29/2023   Procedure: Coronary Ultrasound/IVUS;  Surgeon: Swaziland, Peter M, MD;  Location: [facility] INVASIVE CV LAB;  Service: Cardiovascular;  Laterality: N/A;   ENDOVEIN HARVEST OF GREATER SAPHENOUS VEIN  12/21/2019   Procedure: ENDOVEIN HARVEST OF RIGHT GREATER SAPHENOUS VEIN;  Surgeon: Kerrin Elspeth BROCKS, MD;   Location: [facility] OR;  Service: Open Heart Surgery;;   ESOPHAGOGASTRODUODENOSCOPY     INGUINAL HERNIA REPAIR Right 08/12/2012   Procedure: HERNIA REPAIR INGUINAL ADULT;  Surgeon: Donnice POUR. Belinda, MD;  Location: [facility];  Service: General;  Laterality: Right;   INSERTION OF MESH Right 08/12/2012   Procedure: INSERTION OF MESH;  Surgeon: Donnice POUR. Belinda, MD;  Location: [facility];  Service: General;  Laterality: Right;   LEFT HEART CATH AND CORONARY ANGIOGRAPHY N/A 12/19/2019   Procedure: LEFT HEART CATH AND CORONARY ANGIOGRAPHY;  Surgeon: Court Dorn PARAS, MD;  Location: [facility] INVASIVE CV LAB;  Service: Cardiovascular;  Laterality: N/A;   LEFT HEART CATH AND CORS/GRAFTS ANGIOGRAPHY N/A 04/11/2022   Procedure: LEFT HEART CATH AND CORS/GRAFTS ANGIOGRAPHY;  Surgeon: Wonda Sharper, MD;  Location: [facility] INVASIVE CV LAB;  Service: Cardiovascular;  Laterality: N/A;   TEE WITHOUT  CARDIOVERSION N/A 12/21/2019   Procedure: TRANSESOPHAGEAL ECHOCARDIOGRAM (TEE);  Surgeon: Kerrin Elspeth BROCKS, MD;  Location: [facility] OR;  Service: Open Heart Surgery;  Laterality: N/A;   TONSILLECTOMY     VASECTOMY  1985    Social History   Socioeconomic History   Marital status: Married    Spouse name: Not on file   Number of children: Not on file   Years of education: Not on file   Highest education level: Not on file  Occupational History   Not on file  Tobacco Use   Smoking status: Former    Current packs/day: 0.00    Average packs/day: 1 pack/day for 53.0 years (53.0 ttl pk-yrs)    Types: Cigarettes    Start date: 12/16/1966    Quit date: 12/16/2019    Years since quitting: 3.9   Smokeless tobacco: Never  Substance and Sexual Activity   Alcohol use: Yes    Comment: occ   Drug use: No   Sexual activity: Not on file  Other Topics Concern   Not on file  Social History Narrative   Not on file   Social Drivers of Health   Financial Resource Strain: Not on file  Food Insecurity: Not on  file  Transportation Needs: Not on file  Physical Activity: Not on file  Stress: Not on file  Social Connections: Not on file  Intimate Partner Violence: Not on file    Family History  Problem Relation Age of Onset   Diabetes Mother    Heart disease Mother    Parkinson's disease Mother    Heart disease Father    Cancer Father        melonma/Lung   Diabetes Sister    Diabetes Brother    Heart disease Brother    Multiple sclerosis Daughter     ROS: no fevers or chills, productive cough, hemoptysis, dysphasia, odynophagia, melena, hematochezia, dysuria, hematuria, rash, seizure activity, orthopnea, PND, pedal edema, claudication. Remaining systems are negative.  Physical Exam: Well-developed well-nourished in no acute distress.  Skin is warm and dry.  HEENT is normal.  Neck is supple.  Chest is clear to auscultation with normal expansion.  Cardiovascular exam is regular rate and rhythm.  Abdominal exam nontender or distended. No masses palpated. Extremities show no edema. neuro grossly intact  EKG Interpretation Date/Time:  Friday November 27 2023 08:05:00 EDT Ventricular Rate:  64 PR Interval:  178 QRS Duration:  106 QT Interval:  428 QTC Calculation: 441 R Axis:   -43  Text Interpretation: Sinus rhythm Left axis deviation Septal infarct , age undetermined Confirmed by Pietro Rogue ([id number]) on 11/27/2023 8:06:17 AM    A/P  1 coronary artery disease status post coronary artery bypass graft-also with multiple PCI's.  Continue aspirin  and statin.  Continue Plavix  (recommended indefinitely given multiple stents in the right coronary artery).  Patient is having chest tightness with vigorous activities but not routine activities relieved with rest.  Would like to treat medically if possible.  Add Toprol  25 mg daily.  If his symptoms progress may need repeat catheterization.  2 status post aortic valve replacement-continue SBE prophylaxis.  3 hyperlipidemia-continue  statin.  Will have most recent lipids and liver forwarded to us  from primary care.  4 hypertension-patient's blood pressure is controlled.  Continue present medications.    Rogue Pietro, MD

## 2023-11-20 DIAGNOSIS — I700 Atherosclerosis of aorta: Secondary | ICD-10-CM | POA: Diagnosis not present

## 2023-11-20 DIAGNOSIS — E113293 Type 2 diabetes mellitus with mild nonproliferative diabetic retinopathy without macular edema, bilateral: Secondary | ICD-10-CM | POA: Diagnosis not present

## 2023-11-20 DIAGNOSIS — I2510 Atherosclerotic heart disease of native coronary artery without angina pectoris: Secondary | ICD-10-CM | POA: Diagnosis not present

## 2023-11-20 DIAGNOSIS — E1169 Type 2 diabetes mellitus with other specified complication: Secondary | ICD-10-CM | POA: Diagnosis not present

## 2023-11-23 ENCOUNTER — Other Ambulatory Visit: Payer: Self-pay

## 2023-11-23 DIAGNOSIS — Z87891 Personal history of nicotine dependence: Secondary | ICD-10-CM

## 2023-11-23 DIAGNOSIS — Z122 Encounter for screening for malignant neoplasm of respiratory organs: Secondary | ICD-10-CM

## 2023-11-26 ENCOUNTER — Other Ambulatory Visit: Payer: Self-pay | Admitting: Cardiology

## 2023-11-27 ENCOUNTER — Ambulatory Visit: Attending: Cardiology | Admitting: Cardiology

## 2023-11-27 ENCOUNTER — Encounter: Payer: Self-pay | Admitting: Cardiology

## 2023-11-27 VITALS — BP 120/60 | HR 64 | Ht 69.0 in | Wt 166.0 lb

## 2023-11-27 DIAGNOSIS — I2510 Atherosclerotic heart disease of native coronary artery without angina pectoris: Secondary | ICD-10-CM | POA: Diagnosis not present

## 2023-11-27 DIAGNOSIS — E785 Hyperlipidemia, unspecified: Secondary | ICD-10-CM | POA: Diagnosis not present

## 2023-11-27 DIAGNOSIS — I10 Essential (primary) hypertension: Secondary | ICD-10-CM | POA: Insufficient documentation

## 2023-11-27 DIAGNOSIS — Z952 Presence of prosthetic heart valve: Secondary | ICD-10-CM | POA: Insufficient documentation

## 2023-11-27 MED ORDER — METOPROLOL SUCCINATE ER 25 MG PO TB24: 25.0000 mg | ORAL_TABLET | Freq: Every day | ORAL | 3 refills | Status: AC

## 2023-11-27 NOTE — Patient Instructions (Signed)
 Medication Instructions:    START METOPROLOL  SUCC ER 25 MG ONCE DAILY AT BEDTIME  *If you need a refill on your cardiac medications before your next appointment, please call your pharmacy*  Follow-Up: At [facility], you and your health needs are our priority.  As part of our continuing mission to provide you with exceptional heart care, our providers are all part of one team.  This team includes your primary Cardiologist (physician) and Advanced Practice Providers or APPs (Physician Assistants and Nurse Practitioners) who all work together to provide you with the care you need, when you need it.  Your next appointment:   4 month(s)  Provider:   Redell Shallow, MD

## 2023-11-29 ENCOUNTER — Other Ambulatory Visit: Payer: Self-pay | Admitting: Physician Assistant

## 2023-12-15 DIAGNOSIS — I700 Atherosclerosis of aorta: Secondary | ICD-10-CM | POA: Diagnosis not present

## 2023-12-15 DIAGNOSIS — I2510 Atherosclerotic heart disease of native coronary artery without angina pectoris: Secondary | ICD-10-CM | POA: Diagnosis not present

## 2023-12-15 DIAGNOSIS — E113293 Type 2 diabetes mellitus with mild nonproliferative diabetic retinopathy without macular edema, bilateral: Secondary | ICD-10-CM | POA: Diagnosis not present

## 2023-12-15 DIAGNOSIS — E1169 Type 2 diabetes mellitus with other specified complication: Secondary | ICD-10-CM | POA: Diagnosis not present

## 2023-12-15 DIAGNOSIS — E039 Hypothyroidism, unspecified: Secondary | ICD-10-CM | POA: Diagnosis not present

## 2023-12-20 DIAGNOSIS — I700 Atherosclerosis of aorta: Secondary | ICD-10-CM | POA: Diagnosis not present

## 2023-12-20 DIAGNOSIS — E1169 Type 2 diabetes mellitus with other specified complication: Secondary | ICD-10-CM | POA: Diagnosis not present

## 2023-12-20 DIAGNOSIS — I2510 Atherosclerotic heart disease of native coronary artery without angina pectoris: Secondary | ICD-10-CM | POA: Diagnosis not present

## 2023-12-20 DIAGNOSIS — E113293 Type 2 diabetes mellitus with mild nonproliferative diabetic retinopathy without macular edema, bilateral: Secondary | ICD-10-CM | POA: Diagnosis not present

## 2024-01-15 DIAGNOSIS — E1169 Type 2 diabetes mellitus with other specified complication: Secondary | ICD-10-CM | POA: Diagnosis not present

## 2024-01-15 DIAGNOSIS — E039 Hypothyroidism, unspecified: Secondary | ICD-10-CM | POA: Diagnosis not present

## 2024-01-15 DIAGNOSIS — Z23 Encounter for immunization: Secondary | ICD-10-CM | POA: Diagnosis not present

## 2024-01-15 DIAGNOSIS — I2510 Atherosclerotic heart disease of native coronary artery without angina pectoris: Secondary | ICD-10-CM | POA: Diagnosis not present

## 2024-01-15 DIAGNOSIS — I700 Atherosclerosis of aorta: Secondary | ICD-10-CM | POA: Diagnosis not present

## 2024-01-15 DIAGNOSIS — E113293 Type 2 diabetes mellitus with mild nonproliferative diabetic retinopathy without macular edema, bilateral: Secondary | ICD-10-CM | POA: Diagnosis not present

## 2024-01-19 DIAGNOSIS — E113293 Type 2 diabetes mellitus with mild nonproliferative diabetic retinopathy without macular edema, bilateral: Secondary | ICD-10-CM | POA: Diagnosis not present

## 2024-01-19 DIAGNOSIS — I700 Atherosclerosis of aorta: Secondary | ICD-10-CM | POA: Diagnosis not present

## 2024-01-19 DIAGNOSIS — E1169 Type 2 diabetes mellitus with other specified complication: Secondary | ICD-10-CM | POA: Diagnosis not present

## 2024-01-19 DIAGNOSIS — I2510 Atherosclerotic heart disease of native coronary artery without angina pectoris: Secondary | ICD-10-CM | POA: Diagnosis not present

## 2024-02-09 ENCOUNTER — Encounter: Payer: Self-pay | Admitting: Cardiology

## 2024-02-14 DIAGNOSIS — I2510 Atherosclerotic heart disease of native coronary artery without angina pectoris: Secondary | ICD-10-CM | POA: Diagnosis not present

## 2024-02-14 DIAGNOSIS — E1169 Type 2 diabetes mellitus with other specified complication: Secondary | ICD-10-CM | POA: Diagnosis not present

## 2024-02-14 DIAGNOSIS — E113293 Type 2 diabetes mellitus with mild nonproliferative diabetic retinopathy without macular edema, bilateral: Secondary | ICD-10-CM | POA: Diagnosis not present

## 2024-02-14 DIAGNOSIS — E039 Hypothyroidism, unspecified: Secondary | ICD-10-CM | POA: Diagnosis not present

## 2024-02-14 DIAGNOSIS — I700 Atherosclerosis of aorta: Secondary | ICD-10-CM | POA: Diagnosis not present

## 2024-03-12 ENCOUNTER — Other Ambulatory Visit: Payer: Self-pay | Admitting: Cardiology

## 2024-03-24 ENCOUNTER — Encounter: Payer: Self-pay | Admitting: Cardiology
